# Patient Record
Sex: Female | Born: 1950 | Race: White | Hispanic: No | Marital: Married | Smoking: Never smoker
Health system: Southern US, Community
[De-identification: ages and names within clinical notes are randomized; demographics above are authoritative.]

## PROBLEM LIST (undated history)

## (undated) DIAGNOSIS — E079 Disorder of thyroid, unspecified: Secondary | ICD-10-CM

## (undated) DIAGNOSIS — K219 Gastro-esophageal reflux disease without esophagitis: Secondary | ICD-10-CM

## (undated) DIAGNOSIS — R61 Generalized hyperhidrosis: Secondary | ICD-10-CM

## (undated) DIAGNOSIS — J45909 Unspecified asthma, uncomplicated: Secondary | ICD-10-CM

## (undated) DIAGNOSIS — L0390 Cellulitis, unspecified: Secondary | ICD-10-CM

## (undated) DIAGNOSIS — E785 Hyperlipidemia, unspecified: Secondary | ICD-10-CM

## (undated) DIAGNOSIS — Z952 Presence of prosthetic heart valve: Secondary | ICD-10-CM

## (undated) DIAGNOSIS — N390 Urinary tract infection, site not specified: Secondary | ICD-10-CM

## (undated) DIAGNOSIS — Z860100 Personal history of colon polyps, unspecified: Secondary | ICD-10-CM

## (undated) DIAGNOSIS — R011 Cardiac murmur, unspecified: Secondary | ICD-10-CM

## (undated) DIAGNOSIS — F329 Major depressive disorder, single episode, unspecified: Secondary | ICD-10-CM

## (undated) DIAGNOSIS — N12 Tubulo-interstitial nephritis, not specified as acute or chronic: Secondary | ICD-10-CM

## (undated) DIAGNOSIS — I519 Heart disease, unspecified: Secondary | ICD-10-CM

## (undated) DIAGNOSIS — B019 Varicella without complication: Secondary | ICD-10-CM

## (undated) DIAGNOSIS — B9562 Methicillin resistant Staphylococcus aureus infection as the cause of diseases classified elsewhere: Secondary | ICD-10-CM

## (undated) DIAGNOSIS — F32A Depression, unspecified: Secondary | ICD-10-CM

## (undated) HISTORY — DX: Urinary tract infection, site not specified: N39.0

## (undated) HISTORY — DX: Methicillin resistant Staphylococcus aureus infection as the cause of diseases classified elsewhere: B95.62

## (undated) HISTORY — DX: Hyperlipidemia, unspecified: E78.5

## (undated) HISTORY — PX: CARDIAC SURGERY: SHX584

## (undated) HISTORY — DX: Presence of prosthetic heart valve: Z95.2

## (undated) HISTORY — DX: Depression, unspecified: F32.A

## (undated) HISTORY — DX: Generalized hyperhidrosis: R61

## (undated) HISTORY — PX: VALVE REPLACEMENT: SUR13

## (undated) HISTORY — DX: Personal history of colon polyps, unspecified: Z86.0100

## (undated) HISTORY — DX: Disorder of thyroid, unspecified: E07.9

## (undated) HISTORY — DX: Unspecified asthma, uncomplicated: J45.909

## (undated) HISTORY — DX: Major depressive disorder, single episode, unspecified: F32.9

## (undated) HISTORY — DX: Gastro-esophageal reflux disease without esophagitis: K21.9

## (undated) HISTORY — DX: Personal history of colonic polyps: Z86.010

## (undated) HISTORY — DX: Cellulitis, unspecified: L03.90

## (undated) HISTORY — DX: Cardiac murmur, unspecified: R01.1

## (undated) HISTORY — DX: Tubulo-interstitial nephritis, not specified as acute or chronic: N12

## (undated) HISTORY — DX: Heart disease, unspecified: I51.9

## (undated) HISTORY — DX: Varicella without complication: B01.9

## (undated) HISTORY — PX: PULMONARY VEIN STENOSIS REPAIR: SHX2276

---

## 1968-07-11 HISTORY — PX: ABDOMINAL HYSTERECTOMY: SHX81

## 1968-07-11 HISTORY — PX: PARTIAL HYSTERECTOMY: SHX80

## 2004-05-04 ENCOUNTER — Emergency Department: Payer: Self-pay | Admitting: Emergency Medicine

## 2005-06-15 ENCOUNTER — Ambulatory Visit: Payer: Self-pay | Admitting: Internal Medicine

## 2007-09-27 ENCOUNTER — Ambulatory Visit: Payer: Self-pay | Admitting: Internal Medicine

## 2007-10-15 ENCOUNTER — Ambulatory Visit: Payer: Self-pay | Admitting: Gastroenterology

## 2008-03-29 LAB — HM MAMMOGRAPHY: HM Mammogram: NORMAL

## 2008-08-13 ENCOUNTER — Ambulatory Visit: Payer: Self-pay | Admitting: Family Medicine

## 2008-08-13 DIAGNOSIS — K219 Gastro-esophageal reflux disease without esophagitis: Secondary | ICD-10-CM | POA: Insufficient documentation

## 2008-08-13 DIAGNOSIS — Z860100 Personal history of colon polyps, unspecified: Secondary | ICD-10-CM | POA: Insufficient documentation

## 2008-08-13 DIAGNOSIS — F339 Major depressive disorder, recurrent, unspecified: Secondary | ICD-10-CM | POA: Insufficient documentation

## 2008-08-13 DIAGNOSIS — M8580 Other specified disorders of bone density and structure, unspecified site: Secondary | ICD-10-CM | POA: Insufficient documentation

## 2008-08-13 DIAGNOSIS — R5383 Other fatigue: Secondary | ICD-10-CM

## 2008-08-13 DIAGNOSIS — R32 Unspecified urinary incontinence: Secondary | ICD-10-CM

## 2008-08-13 DIAGNOSIS — R5381 Other malaise: Secondary | ICD-10-CM | POA: Insufficient documentation

## 2008-08-14 LAB — CONVERTED CEMR LAB
ALT: 26 units/L (ref 0–35)
AST: 30 units/L (ref 0–37)
Alkaline Phosphatase: 68 units/L (ref 39–117)
Basophils Absolute: 0.1 10*3/uL (ref 0.0–0.1)
Basophils Relative: 0.9 % (ref 0.0–3.0)
CO2: 29 meq/L (ref 19–32)
Calcium: 9.7 mg/dL (ref 8.4–10.5)
Creatinine, Ser: 0.7 mg/dL (ref 0.4–1.2)
Glucose, Bld: 123 mg/dL — ABNORMAL HIGH (ref 70–99)
HDL: 42.5 mg/dL (ref >39.0)
Hemoglobin: 15.9 g/dL — ABNORMAL HIGH (ref 12.0–15.0)
Lymphocytes Relative: 24 % (ref 12.0–46.0)
MCHC: 34.9 g/dL (ref 30.0–36.0)
Monocytes Relative: 7.8 % (ref 3.0–12.0)
Neutrophils Relative %: 62.4 % (ref 43.0–77.0)
Phosphorus: 3.4 mg/dL (ref 2.3–4.6)
RBC: 5.14 M/uL — ABNORMAL HIGH (ref 3.87–5.11)
Total Bilirubin: 0.7 mg/dL (ref 0.3–1.2)
Total CHOL/HDL Ratio: 5.7
VLDL: 21 mg/dL (ref 0–40)

## 2008-08-19 LAB — CONVERTED CEMR LAB: Vit D, 1,25-Dihydroxy: 13 — ABNORMAL LOW (ref 30–89)

## 2008-08-26 ENCOUNTER — Encounter: Payer: Self-pay | Admitting: Family Medicine

## 2008-08-30 ENCOUNTER — Encounter: Payer: Self-pay | Admitting: Family Medicine

## 2008-09-15 ENCOUNTER — Ambulatory Visit: Payer: Self-pay | Admitting: Family Medicine

## 2008-09-15 DIAGNOSIS — E559 Vitamin D deficiency, unspecified: Secondary | ICD-10-CM | POA: Insufficient documentation

## 2008-09-15 DIAGNOSIS — E785 Hyperlipidemia, unspecified: Secondary | ICD-10-CM

## 2008-09-22 ENCOUNTER — Telehealth: Payer: Self-pay | Admitting: Family Medicine

## 2008-09-24 ENCOUNTER — Ambulatory Visit: Payer: Self-pay | Admitting: Family Medicine

## 2008-09-24 ENCOUNTER — Encounter: Payer: Self-pay | Admitting: Family Medicine

## 2008-10-14 ENCOUNTER — Ambulatory Visit: Payer: Self-pay | Admitting: Family Medicine

## 2008-10-27 ENCOUNTER — Ambulatory Visit: Payer: Self-pay | Admitting: Family Medicine

## 2008-11-07 ENCOUNTER — Encounter: Payer: Self-pay | Admitting: Family Medicine

## 2008-12-03 ENCOUNTER — Ambulatory Visit: Payer: Self-pay | Admitting: Family Medicine

## 2008-12-15 ENCOUNTER — Ambulatory Visit: Payer: Self-pay | Admitting: Family Medicine

## 2008-12-15 DIAGNOSIS — R61 Generalized hyperhidrosis: Secondary | ICD-10-CM

## 2008-12-17 LAB — CONVERTED CEMR LAB: Vit D, 25-Hydroxy: 33 ng/mL (ref 30–89)

## 2009-05-05 ENCOUNTER — Telehealth: Payer: Self-pay | Admitting: Family Medicine

## 2009-05-05 LAB — CONVERTED CEMR LAB
Cholesterol: 184 mg/dL (ref 0–200)
HDL: 45.4 mg/dL (ref >39.00)
Triglycerides: 141 mg/dL (ref 0.0–149.0)
VLDL: 28.2 mg/dL (ref 0.0–40.0)

## 2009-06-12 ENCOUNTER — Ambulatory Visit: Payer: Self-pay | Admitting: Family Medicine

## 2009-09-01 ENCOUNTER — Encounter: Payer: Self-pay | Admitting: Family Medicine

## 2009-11-05 ENCOUNTER — Encounter: Payer: Self-pay | Admitting: Family Medicine

## 2009-11-10 ENCOUNTER — Ambulatory Visit: Payer: Self-pay | Admitting: Internal Medicine

## 2009-12-08 ENCOUNTER — Ambulatory Visit: Payer: Self-pay | Admitting: Family Medicine

## 2009-12-08 DIAGNOSIS — E079 Disorder of thyroid, unspecified: Secondary | ICD-10-CM | POA: Insufficient documentation

## 2009-12-08 DIAGNOSIS — R1011 Right upper quadrant pain: Secondary | ICD-10-CM

## 2009-12-09 ENCOUNTER — Encounter (INDEPENDENT_AMBULATORY_CARE_PROVIDER_SITE_OTHER): Payer: Self-pay | Admitting: *Deleted

## 2009-12-09 LAB — CONVERTED CEMR LAB: Vit D, 25-Hydroxy: 29 ng/mL — ABNORMAL LOW (ref 30–89)

## 2009-12-10 ENCOUNTER — Encounter: Payer: Self-pay | Admitting: Family Medicine

## 2009-12-10 LAB — CONVERTED CEMR LAB
AST: 28 units/L (ref 0–37)
Alkaline Phosphatase: 75 units/L (ref 39–117)
Bilirubin, Direct: 0.1 mg/dL (ref 0.0–0.3)
Hgb A1c MFr Bld: 7 % — ABNORMAL HIGH (ref 4.6–6.5)
TSH: 7.76 microintl units/mL — ABNORMAL HIGH (ref 0.35–5.50)
Total Bilirubin: 0.4 mg/dL (ref 0.3–1.2)

## 2010-02-03 ENCOUNTER — Ambulatory Visit: Payer: Self-pay | Admitting: Family Medicine

## 2010-02-03 DIAGNOSIS — N390 Urinary tract infection, site not specified: Secondary | ICD-10-CM | POA: Insufficient documentation

## 2010-02-03 LAB — CONVERTED CEMR LAB
Bilirubin Urine: NEGATIVE
Glucose, Urine, Semiquant: NEGATIVE
Ketones, urine, test strip: NEGATIVE
Nitrite: NEGATIVE
RBC / HPF: 0
Specific Gravity, Urine: 1.02
Urine crystals, microscopic: 0 /hpf

## 2010-02-05 ENCOUNTER — Encounter: Payer: Self-pay | Admitting: Family Medicine

## 2010-02-05 ENCOUNTER — Ambulatory Visit: Payer: Self-pay | Admitting: Family Medicine

## 2010-03-05 ENCOUNTER — Ambulatory Visit: Payer: Self-pay | Admitting: Family Medicine

## 2010-03-06 LAB — CONVERTED CEMR LAB
Free T4: 0.75 ng/dL (ref 0.60–1.60)
TSH: 6.95 microintl units/mL — ABNORMAL HIGH (ref 0.35–5.50)

## 2010-03-11 ENCOUNTER — Encounter: Payer: Self-pay | Admitting: Family Medicine

## 2010-04-20 ENCOUNTER — Encounter (INDEPENDENT_AMBULATORY_CARE_PROVIDER_SITE_OTHER): Payer: Self-pay | Admitting: *Deleted

## 2010-07-07 ENCOUNTER — Ambulatory Visit
Admission: RE | Admit: 2010-07-07 | Discharge: 2010-07-07 | Payer: Self-pay | Source: Home / Self Care | Attending: Family Medicine | Admitting: Family Medicine

## 2010-08-12 NOTE — Assessment & Plan Note (Signed)
Summary: FOLLOW UP LABS PER DR Shelonda Saxe/[place]   Vital Signs:  Patient profile:   60 year old female Height:      60 inches Weight:      173.25 pounds BMI:     33.96 Temp:     98 degrees F oral Pulse rate:   80 / minute Pulse rhythm:   regular BP sitting:   116 / 78  (left arm) Cuff size:   regular  Vitals Entered By: Lewanda Rife LPN (Dec 08, 2009 8:36 AM) CC: follow-up visit after labs   History of Present Illness: here for f/u of DM and chol and recent labs from work   is feeling fair - but really tired  could sleep 24 hours a day  does not sleep well in general  sweats a lot   no sex drive   not doing well with healthy diet and exercise  has done the DM classes  plans to eat more veg this summer   eats too much corn   does not exercise - cannot find time   gluc fasting was 139-- needs AIC  opthy - none within past year  vit D due for check   LDL 125 (up from 110 last time   wt is up 2 lb  bp great at 116/78  tsh high at over 8  lots of hypothyroidism in family  had a goiter in past and it went away -- ? if hyperthyroid in the past     her mother's health is getting worse and worse -- with memory too  2 kids - caring for them - in group homes   Allergies: 1)  ! 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  Retired sister is pt here- Therapist, occupational cares for aging mother with alz cares for 2 disabled children  Never Smoked Alcohol use-occ wine  Drug use-no G1P1 adopted son - Asberger's syndrome  daughter with Down's syndrome --- both in group homes  cares for daughter with Down's Syndrome  cares for her 70 year old mother   Risk Factors: Smoking Status: never (08/13/2008)  Past Medical History: heart valve/ pulmonary- replacement  Depression GERD Urinary incontinence Colonic polyps, hx of atypical pap in past  osteoporosis  DM 2  lipoma 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)  cardiology- Dr Earma Reading [facility]) [phone] [facility]  Social History: 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 Eyes:  Denies blurring, eye irritation, and itching. ENT:  Denies sore throat. CV:  Denies chest pain or discomfort, palpitations, shortness of breath with exertion, and swelling of feet. Resp:  Denies cough and wheezing. GI:  Complains of abdominal pain; denies change in bowel habits and indigestion. GU:  Denies discharge, dysuria, and urinary frequency. MS:  L side of neck is always tight and sore -- MRA was nl . Derm:  Denies itching, lesion(s), poor wound healing, and rash. Neuro:  Denies numbness, tingling, and weakness. Psych:  Denies anxiety and  depression. Endo:  Denies cold intolerance, excessive thirst, excessive urination, and heat intolerance. Heme:  Denies abnormal bruising and bleeding.  Physical Exam  General:  overweight but generally well appearing  Head:  normocephalic, atraumatic, and no abnormalities observed.   Eyes:  vision grossly intact, pupils equal, pupils round, and pupils reactive to light.  no conjunctival pallor, injection or icterus  Mouth:  pharynx pink and moist.   Neck:  supple with full rom and no masses or thyromegally, no JVD or carotid bruit  Chest Wall:  No deformities, masses, or tenderness noted. Lungs:  Normal respiratory effort, chest expands symmetrically. Lungs are clear to auscultation, no crackles or wheezes. Heart:  heart M is stable  Abdomen:  mild RUQ tenderness without murphy sign no rebound or gaurding  soft, normal bowel sounds, no distention, no hepatomegaly, and no splenomegaly.   Msk:  No deformity or scoliosis noted of thoracic or lumbar spine.  no acute joint changes  Pulses:  R and L carotid,radial,femoral,dorsalis pedis and posterior tibial pulses are full and equal bilaterally Extremities:  No clubbing, cyanosis, edema, or deformity noted with normal full range of motion of all joints.   Neurologic:  sensation intact to light touch, gait normal, and DTRs symmetrical and normal.  no tremor  Skin:  Intact without suspicious lesions or rashes Cervical Nodes:  No lymphadenopathy noted Inguinal Nodes:  No significant adenopathy Psych:  nl affect - but seems fatigued    Impression & Recommendations:  Problem # 1:  THYROID STIMULATING HORMONE, ABNORMAL (ICD-246.9) Assessment New hypothyroid likely causing fatigue check profile  hx of past goiter- no longer there  then adv  f/u 2-4 wk Orders: Venipuncture (47829) TLB-Hepatic/Liver Function Pnl (80076-HEPATIC) TLB-TSH (Thyroid Stimulating Hormone) (84443-TSH) TLB-T3 Uptake (84479-T3UP) TLB-T4 (Thyrox), Free  [phone]) T-Vitamin D (25-Hydroxy) (78469-62952) TLB-A1C / Hgb A1C (Glycohemoglobin) (83036-A1C) Specimen Handling (84132) Prescription Created Electronically [phone])  Problem # 2:  UNSPECIFIED VITAMIN D DEFICIENCY (ICD-268.9) Assessment: Unchanged check level - with OP and fatigue  Orders: Venipuncture ([address]) TLB-Hepatic/Liver Function Pnl (80076-HEPATIC) TLB-TSH (Thyroid Stimulating Hormone) (84443-TSH) TLB-T3 Uptake (84479-T3UP) TLB-T4 (Thyrox), Free [phone]) T-Vitamin D (25-Hydroxy) (25956-38756) TLB-A1C / Hgb A1C (Glycohemoglobin) (83036-A1C) Specimen Handling ([address]329) Prescription Created Electronically [phone])  Problem # 3:  DIABETES-TYPE 2 (ICD-250.00) Assessment: Unchanged  disc with pt  may not be well controlled disc healthy diet (low simple sugar/ choose complex carbs/ low sat fat) diet and exercise in detail  get eye exam - adv  AIC today  disc at f/u Her updated medication list for this problem includes:    Aspirin 81 Mg Tabs (Aspirin) .Marland Kitchen... Take 1 tablet by mouth once a day  Orders: Venipuncture ([address]606) TLB-Hepatic/Liver Function Pnl (80076-HEPATIC) TLB-TSH (Thyroid  Stimulating Hormone) (84443-TSH) TLB-T3 Uptake (84479-T3UP) TLB-T4 (Thyrox), Free [phone]) T-Vitamin D (25-Hydroxy) [phone]) TLB-A1C / Hgb A1C (Glycohemoglobin) [phone]) Prescription Created Electronically [phone])  Labs Reviewed: Creat: 0.7 (08/13/2008)    Reviewed HgBA1c results: 6.6 (12/03/2008)  6.7 (08/13/2008)  Problem # 4:  HYPERLIPIDEMIA (ICD-272.4) Assessment: Deteriorated  rev labs and LDL is too high on statin disc low sat fat diet may need to inc dose if not imp Her updated medication list for this problem includes:    Zocor 20 Mg Tabs (Simvastatin) .Marland Kitchen... 1 by mouth once daily in evening for cholesterol  Labs Reviewed: SGOT: 26 (10/27/2008)   SGPT: 23 (10/27/2008)   HDL:45.40 (10/27/2008), 42.5 (08/13/2008)  LDL:110 (10/27/2008), DEL (08/13/2008)   Chol:184 (10/27/2008), 244 (08/13/2008)  Trig:141.0 (10/27/2008), 104 (08/13/2008)  Orders: Prescription Created Electronically [phone])  Problem # 5:  DEPRESSION (ICD-311) Assessment: Unchanged  refl med - overall stable  disc ongoing stress- caring for her mother  Her updated medication list for this problem includes:    Prozac 40 Mg Caps (Fluoxetine hcl) .Marland Kitchen... 1 by mouth once daily  Orders: Prescription Created Electronically [phone])  Problem # 6:  RUQ PAIN (ICD-789.01) Assessment: New  ongoing for while - worse at night  had appy years ago  ? adhesions vs poss gallstones will rev lab and disc further at f/u - consider [place]  Orders: Prescription Created Electronically [phone])  Complete Medication List: 1)  Prozac 40 Mg Caps (Fluoxetine hcl) .Marland Kitchen.. 1 by mouth once daily 2)  Zocor 20 Mg Tabs (Simvastatin) .Marland Kitchen.. 1 by mouth once daily in evening for cholesterol 3)  Aspirin 81 Mg Tabs (Aspirin) .... Take 1 tablet by mouth once a day 4)  Cvs Vitamin D 2000 Unit Tabs (Cholecalciferol) .... Take 1 tablet by mouth once a day  Patient Instructions: 1)  don't forget to get your annual diabetic eye exam  2)  exericse is improtant and may really help you deal with stress as well  3)  labs today for AIC and vit D and blood count and thyroid profile  4)  follow up in about 2-4 weeks to review labs and also to discuss your abdominal pain in more detail  5)  please work on low sugar and low fat diet  Prescriptions: ZOCOR 20 MG TABS (SIMVASTATIN) 1 by mouth once daily in evening for cholesterol  #30 x 11   Entered and Authorized by:   Judith Part MD   Signed by:   Judith Part MD on 12/08/2009   Method used:   Electronically to        [id number]       [address]       [place], [place]  [address]       Ph: [phone]       Fax: [phone]   RxID:   [id number] PROZAC 40 MG CAPS (FLUOXETINE HCL) 1 by mouth once daily  #30 x 11   Entered and  Authorized by:   Judith Part MD   Signed by:   Judith Part MD on 12/08/2009   Method used:   Electronically to        [id number]       [address]       [place], [place]  [address]       Ph: [phone]       Fax: [phone]   RxID:   [id number]   Current Allergies (reviewed today): ! *  OXYCONTIN

## 2010-08-12 NOTE — Consult Note (Signed)
Summary: [facility]  [facility]   Imported By: Lanelle Bal 04/14/2010 10:34:57  _____________________________________________________________________  External Attachment:    Type:   Image     Comment:   External Document

## 2010-08-12 NOTE — Letter (Signed)
Summary: [place] No Show Letter  [place] at [facility]  [address] [place], [place] [address]   Phone: [phone]  Fax: [phone]    04/20/2010 MRN: [id number]  [facility] [address] [place], [place]  [address]   Dear Ms. [place],   Our records indicate that you missed your scheduled appointment with ___Lab __________________ on _10.11.11___________.  Please contact this office to reschedule your appointment as soon as possible.  It is important that you keep your scheduled appointments with your physician, so we can provide you the best care possible.  Please be advised that there may be a charge for "no show" appointments.    Sincerely,   [place] at [facility]

## 2010-08-12 NOTE — Assessment & Plan Note (Signed)
Summary: sinus infection/alc   Vital Signs:  Patient profile:   60 year old female Weight:      171 pounds BMI:     33.52 Temp:     97.9 degrees F oral Pulse rate:   68 / minute Pulse rhythm:   regular Resp:     14 per minute BP sitting:   110 / 70  (left arm) Cuff size:   regular  Vitals Entered By: Mervin Hack CMA Duncan Dull) (Nov 10, 2009 11:03 AM) CC: sinus infection   History of Present Illness: Having sinus infection Has lots of yellow nasal discharge COugh--with some yellow stuff also--thinks it is PND  ear pain but no sig headache sore throat dizzy when bending over  No fever Has felt cold then warm but no shakes NO SOB  tried mucinex DM---slight help  started 4days ago sinus infections in past  Allergies: 1)  ! * Oxycontin  Past History:  Past medical, surgical, family and social histories (including risk factors) reviewed for relevance to current acute and chronic problems.  Past Medical History: Reviewed history from 12/15/2008 and no changes required. 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 R axilla- [place] (per pt ) 2D echo 2/10 mild AR, trivial MR, mod PR, trivial Tr- nl LV syst fxn  Family History: Reviewed history from 08/13/2008 and no changes required. father - alcohol/ abusive and died of cirrhosis mother with DM , copd , obese, depression  sister with DM daughter with Down's Syndrome  daughter with lupus and ulcerative colitis and RA  brother  with Down's Syndrome sister with coarctation of aorta- she died 8 years ago    Family History Breast cancer  Family History of Arthritis Family History of Heart Disease  Social History: Reviewed history from 08/13/2008 and no changes required. 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  NAD Head:  no frontal or maxillary tenderness Ears:  R ear normal and L ear normal.   Nose:  moderate inflammation--right worse than left Mouth:  no erythema and no exudates.   Neck:  supple, no masses, and no cervical lymphadenopathy.   Lungs:  normal respiratory effort, no intercostal retractions, no accessory muscle use, and normal breath sounds.  COarse dry cough   Impression & Recommendations:  Problem # 1:  SINUSITIS - ACUTE-NOS (ICD-461.9) Assessment New not clear if viral or bacterial but she feels she is worsening discussed supportive care rx for amoxil  Complete Medication List: 1)  Prozac 40 Mg Caps (Fluoxetine hcl) .Marland Kitchen.. 1 by mouth once daily 2)  Zocor 20 Mg Tabs (Simvastatin) .Marland Kitchen.. 1 by mouth once daily in evening  for cholesterol 3)  Aspirin 81 Mg Tabs (Aspirin) .... Take 1 tablet by mouth once a day 4)  Cvs Vitamin D 2000 Unit Tabs (Cholecalciferol) .... Take 1 tablet by mouth once a day 5)  Amoxicillin 500 Mg Tabs (Amoxicillin) .... 2 tabs by mouth two times a day for sinus infection  Patient Instructions: 1)  Please schedule a follow-up appointment as needed .  Prescriptions: AMOXICILLIN 500 MG TABS (AMOXICILLIN) 2 tabs by mouth two times a day for sinus infection  #40 x 0   Entered and Authorized by:    Cindee Salt MD   Signed by:   Cindee Salt MD on 11/10/2009   Method used:   Electronically to        [id number]       [address]       [place], [place]  [address]       Ph: [phone]       Fax: [phone]   RxID:   [id number]   Current Allergies (reviewed today): ! * OXYCONTIN

## 2010-08-12 NOTE — Miscellaneous (Signed)
Summary: Synthroid med list update  Clinical Lists Changes  Medications: Added new medication of SYNTHROID 50 MCG TABS (LEVOTHYROXINE SODIUM) Take 1 tablet by mouth once a day     Current Allergies: ! * OXYCONTIN

## 2010-08-12 NOTE — Assessment & Plan Note (Signed)
Summary: RIGHT SIDE PAIN/CLE   Vital Signs:  Patient profile:   60 year old female Height:      60 inches Weight:      175 pounds BMI:     34.30 Temp:     98.8 degrees F oral Pulse rate:   80 / minute Pulse rhythm:   regular BP sitting:   106 / 72  (left arm) Cuff size:   regular  Vitals Entered By: Lewanda Rife LPN (February 03, 2010 12:48 PM) CC: Dull pain in rt side on and off worse when lays down   History of Present Illness: here for R side pain that is dull and intermittent - worse when lying down is not all the time  feels bloated to in abdomen in general  stools are rather loose - for the past month   eating makes no difference   no change with bend or twist or turn   no rash   worse to lie on her L side   recent labs ok    always has some lower back pain in the middle   last colonoscopy jan of 09 -- and due in 3 years - will be [place] of 2012  is worried about her polyps  no more appendix does have a gallbladder     wt is up 2 lb  bp 106/72  did have to start thyroid suppl  urine - does burn at times to urinate  no frequency or odor or blood     Allergies: 1)  ! 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, occupational cares for aging mother with alz cares for 2 disabled children  Never Smoked Alcohol use-occ wine  Drug use-no G1P1 adopted son - Asberger's syndrome  daughter with Down's syndrome --- both in group homes  cares for daughter with Down's Syndrome  cares for her 70 year old mother   Risk Factors: Smoking Status: never (08/13/2008)  Past Medical History: heart valve/ pulmonary- replacement  Depression GERD Urinary incontinence Colonic polyps, hx of atypical pap in past  osteoporosis  DM 2  lipoma 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 Eyes:  Denies blurring and eye irritation. CV:  Denies chest pain or discomfort, palpitations, shortness of breath with exertion, and swelling of feet. Resp:  Denies cough, shortness of breath, and wheezing. GI:  Complains of abdominal pain, change in bowel habits, diarrhea, and gas; denies bloody stools, indigestion, loss of  appetite, nausea, and vomiting. MS:  Complains of mid back pain. Derm:  Denies itching, lesion(s), poor wound healing, and rash. Neuro:  Denies numbness, tingling, and weakness. Psych:  mood is ok . Endo:  Denies excessive thirst and excessive urination. Heme:  Denies abnormal bruising and bleeding.  Physical Exam  General:  overweight but generally well appearing  Head:  normocephalic, atraumatic, and no abnormalities observed.   Eyes:  vision grossly intact, pupils equal, pupils round, and pupils reactive to light.  no conjunctival pallor, injection or icterus  Mouth:  pharynx pink and moist.   Neck:  supple with full rom and no masses or thyromegally, no JVD or carotid bruit  Lungs:  Normal respiratory effort,  chest expands symmetrically. Lungs are clear to auscultation, no crackles or wheezes. Heart:  heart M is stable  Abdomen:  mild RUQ tenderness without murphy sign no rebound or gaurding  soft, normal bowel sounds, no distention, no hepatomegaly, and no splenomegaly.   some R lateral rib tenderness  Msk:  mild R sided cva tenderness today no spinal tenderness  Extremities:  No clubbing, cyanosis, edema, or deformity noted with normal full range of motion of all joints.   Neurologic:  sensation intact to light touch, gait normal, and DTRs symmetrical and normal.  no tremor  Skin:  Intact without suspicious lesions or rashes Cervical Nodes:  No lymphadenopathy noted Inguinal Nodes:  No significant adenopathy Psych:  normal affect, talkative and pleasant    Impression & Recommendations:  Problem # 1:  THYROID STIMULATING HORMONE, ABNORMAL (ICD-246.9) Assessment Unchanged enc pt to be better about dosing and re check thyroid panel on low dose supplementation in 1 mo  no clinical changes   Problem # 2:  RUQ PAIN (ICD-789.01) non specific with some tenderness differential is wide incl gallstones/ kidney pathology/ adhesions/ radiculopathy will check abd [place]  tx poss uti with flank pain and obt cx then update old labs reviewed  Orders: Radiology Referral (Radiology)  Complete Medication List: 1)  Prozac 40 Mg Caps (Fluoxetine hcl) .Marland Kitchen.. 1 by mouth once daily 2)  Zocor 20 Mg Tabs (Simvastatin) .Marland Kitchen.. 1 by mouth once daily in evening for cholesterol 3)  Aspirin 81 Mg Tabs (Aspirin) .... Take 1 tablet by mouth once a day 4)  Cvs Vitamin D 2000 Unit Tabs (Cholecalciferol) .... Take 2  tablets by mouth once a day 5)  Synthroid 50 Mcg Tabs (Levothyroxine sodium) .... Take 1 tablet by mouth once a day 6)  Cipro 500 Mg Tabs (Ciprofloxacin hcl) .Marland Kitchen.. 1 by mouth two times a day for 7 days  Other Orders: T-Culture, Urine ([id number]) UA Dipstick W/ Micro (manual) ([address])  Patient  Instructions: 1)  we will refer you for abdominal ultrasound at check out  2)  schedule labs for tsh/ free T4 in 1 mo 244.9-- keep taking your thyroid med  3)  take the cipro as directed - and drink lots of water 4)  will make further plan when ultrasound and urine culture return  Prescriptions: CIPRO 500 MG TABS (CIPROFLOXACIN HCL) 1 by mouth two times a day for 7 days  #14 x 0   Entered and Authorized by:   Judith Part MD   Signed by:   Judith Part MD on 02/03/2010   Method used:   [facility] to        [id number]       [address]  [place], [place]  [address]       Ph: [phone]       Fax: [phone]   RxID:   [id number]   Current Allergies (reviewed today): ! * OXYCONTIN  Laboratory Results   Urine Tests  Date/Time Received: February 03, 2010 12:49 PM  Date/Time Reported: February 03, 2010 12:49 PM   Routine Urinalysis   Color: yellow Appearance: Hazy Glucose: negative   (Normal Range: Negative) Bilirubin: negative   (Normal Range: Negative) Ketone: negative   (Normal Range: Negative) Spec. Gravity: 1.020   (Normal Range: 1.003-1.035) Blood: trace-lysed   (Normal Range: Negative) pH: 6.0   (Normal Range: 5.0-8.0) Protein: trace   (Normal Range: Negative) Urobilinogen: 0.2   (Normal Range: 0-1) Nitrite: negative   (Normal Range: Negative) Leukocyte Esterace: small   (Normal Range: Negative)  Urine Microscopic WBC/HPF: 3-4 RBC/HPF: 0 Bacteria/HPF: few Mucous/HPF: few Epithelial/HPF: 0-1 Crystals/HPF: 0 Casts/LPF: 0 Yeast/HPF: 0 Other: 0

## 2010-08-12 NOTE — Assessment & Plan Note (Signed)
Summary: CONGESTION/CLE   Vital Signs:  Patient profile:   60 year old female Height:      60 inches Weight:      173.25 pounds BMI:     33.96 Temp:     98.1 degrees F oral Pulse rate:   84 / minute Pulse rhythm:   regular BP sitting:   138 / 86  (left arm) Cuff size:   regular  Vitals Entered By: Delilah Shan CMA Duncan Dull) (July 07, 2010 11:57 AM) CC: Congestion, ? sinus infection.  Patient has taken some Amoxicillin 400 mg. that she had on hand.   History of Present Illness: Pt here for congestion.  She took Amox over Christmas for congestion and cough...it was left over from dental work in the past. She had no chills and did not take her temp but thinks she had a fever. She had no headache except with leaning over. She has right ear congestion and discomfort. Her nose is running with yellow to clear  sputum.  She had ST, now gone. She had minimal cough that was nonproductive. She had no N/V.  She took some Amox and sinus medication. She has been sleeping ok.  Problems Prior to Update: 1)  Uti  (ICD-599.0) 2)  Ruq Pain  (ICD-789.01) 3)  Thyroid Stimulating Hormone, Abnormal  (ICD-246.9) 4)  Hyperhidrosis  (ICD-780.8) 5)  Unspecified Vitamin D Deficiency  (ICD-268.9) 6)  Other Screening Mammogram  (ICD-V76.12) 7)  Diabetes-type 2  (ICD-250.00) 8)  Hyperlipidemia  (ICD-272.4) 9)  Pulmonary Valve Insufficiency  (ICD-424.3) 10)  Osteoporosis  (ICD-733.00) 11)  Fatigue  (ICD-780.79) 12)  Family History Breast Cancer 1st Degree Relative <50  (ICD-V16.3) 13)  Family History of Asthma  (ICD-V17.5) 14)  Family History Diabetes 1st Degree Relative  (ICD-V18.0) 15)  Family History of Alcoholism/addiction  (ICD-V61.41) 16)  Colonic Polyps, Hx of  (ICD-V12.72) 17)  Urinary Incontinence  (ICD-788.30) 18)  Gerd  (ICD-530.81) 19)  Depression  (ICD-311)  Medications Prior to Update: 1)  Prozac 40 Mg Caps (Fluoxetine Hcl) .Marland Kitchen.. 1 By Mouth Once Daily 2)  Zocor 20 Mg Tabs  (Simvastatin) .Marland Kitchen.. 1 By Mouth Once Daily in Evening For Cholesterol 3)  Aspirin 81 Mg  Tabs (Aspirin) .... Take 1 Tablet By Mouth Once A Day 4)  [facility] Vitamin D 2000 Unit Tabs (Cholecalciferol) .... Take 2  Tablets By Mouth Once A Day 5)  Synthroid 75 Mcg Tabs (Levothyroxine Sodium) .Marland Kitchen.. 1 By Mouth Once Daily 6)  Cipro 500 Mg Tabs (Ciprofloxacin Hcl) .Marland Kitchen.. 1 By Mouth Two Times A Day For 7 Days  Allergies: 1)  ! * Oxycontin  Physical Exam  General:  Mildly overweight but generally well appearing  Head:  Normocephalic, atraumatic, and no abnormalities observed.  Sinuses NT. Eyes:  Conjunctiva clear bilaterally.  Ears:  External ear exam shows no significant lesions or deformities.  Otoscopic examination reveals clear canals, tympanic membranes are intact bilaterally without bulging, retraction, inflammation or discharge. Hearing is grossly normal bilaterally. Nose:  Minimal congestion and nasal swelling with scant clear mucous. Mouth:  Oral mucosa and oropharynx without lesions or exudates.  Teeth in good repair. Neck:  supple with full rom and no masses or thyromegally, no JVD or carotid bruit  Lungs:  Normal respiratory effort, chest expands symmetrically. Lungs are clear to auscultation, no crackles or wheezes. Heart:  heart M is stable    Impression & Recommendations:  Problem # 1:  URI (ICD-465.9) Assessment New See instructions. Her updated medication  list for this problem includes:    Aspirin 81 Mg Tabs (Aspirin) .Marland Kitchen... Take 1 tablet by mouth once a day  Instructed on symptomatic treatment. Call if symptoms persist or worsen.   Complete Medication List: 1)  Prozac 40 Mg Caps (Fluoxetine hcl) .Marland Kitchen.. 1 by mouth once daily 2)  Zocor 20 Mg Tabs (Simvastatin) .Marland Kitchen.. 1 by mouth once daily in evening for cholesterol 3)  Aspirin 81 Mg Tabs (Aspirin) .... Take 1 tablet by mouth once a day 4)  [facility] Vitamin D 2000 Unit Tabs (Cholecalciferol) .... Take 2  tablets by mouth once a day 5)   Synthroid 75 Mcg Tabs (Levothyroxine sodium) .Marland Kitchen.. 1 by mouth once daily 6)  Multivitamins Tabs (Multiple vitamin) .... Take 1 tablet by mouth once a day 7)  Zithromax Z-pak 250 Mg Tabs (Azithromycin) .... As dir  Patient Instructions: 1)  Take Guaifenesin by [facility] or [facility] and getting MUCOUS RELIEF EXPECTORANT (400mg ), take 11/2 tabs by mouth AM and NOON. 2)  Drink lots of fluids anytime taking Guaifenesin.  3)  Take Tyl ES 2 tabs by mouth three times a day regularly for 4-5 days. 4)  Use cough drop as described.  5)  By Sat if no impr, start Zithromax. Throw away if not used this week. Prescriptions: ZITHROMAX Z-PAK 250 MG TABS (AZITHROMYCIN) as dir  #1 pak x 0   Entered and Authorized by:   Shaune Leeks MD   Signed by:   Shaune Leeks MD on 07/07/2010   Method used:   Print then Give to Patient   RxID:   [id number]    Orders Added: 1)  Est. Patient Level III [14782]    Current Allergies (reviewed today): ! * OXYCONTIN

## 2010-08-12 NOTE — Letter (Signed)
Summary: [facility]  [facility]   Imported By: Lanelle Bal 09/08/2009 08:25:52  _____________________________________________________________________  External Attachment:    Type:   Image     Comment:   External Document

## 2010-08-12 NOTE — Miscellaneous (Signed)
  Clinical Lists Changes  Medications: Changed medication from [facility] VITAMIN D 2000 UNIT TABS (CHOLECALCIFEROL) Take 1 tablet by mouth once a day to [facility] VITAMIN D 2000 UNIT TABS (CHOLECALCIFEROL) Take 2  tablets by mouth once a day

## 2010-08-13 ENCOUNTER — Emergency Department: Payer: Self-pay | Admitting: Emergency Medicine

## 2010-08-31 ENCOUNTER — Encounter: Payer: Self-pay | Admitting: Family Medicine

## 2010-09-21 NOTE — Consult Note (Signed)
Summary: [facility]  [facility]   Imported By: Maryln Gottron 09/16/2010 15:23:17  _____________________________________________________________________  External Attachment:    Type:   Image     Comment:   External Document

## 2010-12-16 ENCOUNTER — Encounter: Payer: Self-pay | Admitting: Family Medicine

## 2010-12-17 ENCOUNTER — Ambulatory Visit (INDEPENDENT_AMBULATORY_CARE_PROVIDER_SITE_OTHER): Payer: Managed Care, Other (non HMO) | Admitting: Family Medicine

## 2010-12-17 ENCOUNTER — Telehealth: Payer: Self-pay | Admitting: Family Medicine

## 2010-12-17 ENCOUNTER — Encounter: Payer: Self-pay | Admitting: Family Medicine

## 2010-12-17 DIAGNOSIS — E559 Vitamin D deficiency, unspecified: Secondary | ICD-10-CM

## 2010-12-17 DIAGNOSIS — J209 Acute bronchitis, unspecified: Secondary | ICD-10-CM

## 2010-12-17 DIAGNOSIS — M810 Age-related osteoporosis without current pathological fracture: Secondary | ICD-10-CM

## 2010-12-17 DIAGNOSIS — E079 Disorder of thyroid, unspecified: Secondary | ICD-10-CM

## 2010-12-17 DIAGNOSIS — E785 Hyperlipidemia, unspecified: Secondary | ICD-10-CM

## 2010-12-17 DIAGNOSIS — R61 Generalized hyperhidrosis: Secondary | ICD-10-CM

## 2010-12-17 DIAGNOSIS — E119 Type 2 diabetes mellitus without complications: Secondary | ICD-10-CM

## 2010-12-17 DIAGNOSIS — J019 Acute sinusitis, unspecified: Secondary | ICD-10-CM | POA: Insufficient documentation

## 2010-12-17 DIAGNOSIS — K219 Gastro-esophageal reflux disease without esophagitis: Secondary | ICD-10-CM

## 2010-12-17 NOTE — Patient Instructions (Signed)
Drink lots of fluids  I think the sinus infection is slowly improving Your bronchitis may be viral -- and it will take time to get better  Continue all your medicines  Get some rest !!! That is most important ! If you worsen-- with increased cough/fever/ facial pain or not improving in a week- please let me know

## 2010-12-17 NOTE — Assessment & Plan Note (Signed)
This is most likely viral and slowly improving Benign exam today Continue omnicef (for sinusitis) hycodan and proair  Update asap if worse or not imp in a week

## 2010-12-17 NOTE — Telephone Encounter (Signed)
This was done in error.

## 2010-12-17 NOTE — Assessment & Plan Note (Addendum)
Head pressure and nasal symptoms are imp on omnicef Rev UC notes today Started as a cold Will finish med Update if worse or not imp in 1 week

## 2010-12-17 NOTE — Progress Notes (Signed)
Subjective:    Patient ID: Haley Young, female    DOB: [birth year], 60 y.o.   MRN: [id number]  HPI Here for bronchitis symptoms  Last Saturday started with severe fatigue and runny nose and cough  Went to [employer] on Monday -- was really bad  Thought she had pneumonia  Then did cxr and dx with bronchitis and sinusitis   Given omnicef and proair and hycodan Still coughs all night   Sinus pain/ headaches are improved No fever  Coughing up yellow mucous  Is wheezing fair amt -- uses inhaler and is helping     Brother and son had pneumonia - was exp to that and was worried  Was originally here to rev her labs - forgot to bring them  Decided to be seen for illness instead   Patient Active Problem List  Diagnoses  . THYROID STIMULATING HORMONE, ABNORMAL  . DIABETES-TYPE 2  . UNSPECIFIED VITAMIN D DEFICIENCY  . HYPERLIPIDEMIA  . DEPRESSION  . PULMONARY VALVE INSUFFICIENCY  . GERD  . OSTEOPOROSIS  . FATIGUE  . HYPERHIDROSIS  . URINARY INCONTINENCE  . RUQ PAIN  . COLONIC POLYPS, HX OF  . Bronchitis, acute  . Acute sinusitis   Past Medical History  Diagnosis Date  . 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 GERD (gastroesophageal reflux disease)   . Depression   . Urinary incontinence   . Hx of colonic polyps   . Osteoporosis   . Diabetes mellitus   . Lipoma     R axilla   . Hyperhidrosis     especially with hot flashes    . Frequent UTI   . Pyelocystitis     as a child    Past Surgical History  Procedure Date  . Partial hysterectomy 1970  . Cardiac surgery 1955, 2005  . Valve replacement   . Pulmonary vein stenosis repair    History  Substance Use Topics  . Smoking status: Never Smoker   . Smokeless tobacco: Not on file  . Alcohol Use: Yes     occasional wine    Family History  Problem Relation Age of Onset  . Diabetes Mother   . COPD Mother   . Obesity Mother   . Depression Mother   . Alcohol abuse Father   . Cirrhosis  Father   . Diabetes Sister   . Down syndrome Brother   . Down syndrome Daughter   . Lupus Daughter   . Ulcerative colitis Daughter   . Coarctation of the aorta Sister    Allergies  Allergen Reactions  . Oxycodone Hcl     REACTION: hallucinations  . Oxycontin Other (See Comments)    halucinations   Current Outpatient Prescriptions on File Prior to Visit  Medication Sig Dispense Refill  . aspirin 81 MG tablet Take 81 mg by mouth daily.        Marland Kitchen FLUoxetine (PROZAC) 40 MG capsule Take 40 mg by mouth daily.        . Cholecalciferol 2000 UNITS TABS Take 2 tablets by mouth once a day       . levothyroxine (SYNTHROID) 75 MCG tablet Take 75 mcg by mouth daily.        . Multiple Vitamin (MULTIVITAMIN) tablet Take 1 tablet by mouth daily.        . simvastatin (ZOCOR) 20 MG tablet 1 by mouth once daily in evening for cholesterol  Review of Systems Review of Systems  Constitutional: Negative for fever, appetite change, fatigue and unexpected weight change.  Eyes: Negative for pain and visual disturbance.  ENT pos for runny and stuffy nose and facial pain that is improving Respiratory: pos for cough and slt wheeze/ no sob no cp  Cardiovascular: Negative.for cp or palpitations   Gastrointestinal: Negative for nausea, diarrhea and constipation.  Genitourinary: Negative for urgency and frequency.  Skin: Negative for pallor. or rash  Neurological: Negative for weakness, light-headedness, numbness and headaches.  Hematological: Negative for adenopathy. Does not bruise/bleed easily.  Psychiatric/Behavioral: Negative for dysphoric mood. The patient is not nervous/anxious.         Objective:   Physical Exam  Constitutional: She appears well-developed and well-nourished. No distress.       overwt and well appearing   HENT:  Head: Normocephalic and atraumatic.  Right Ear: External ear normal.  Left Ear: External ear normal.  Mouth/Throat: Oropharynx is clear and moist.        Mildly tender frontal sinuses - per pt improved  Nares boggy and injected  Eyes: Conjunctivae and EOM are normal. Pupils are equal, round, and reactive to light.  Neck: Normal range of motion. Neck supple. No thyromegaly present.  Cardiovascular: Normal rate and regular rhythm.   Pulmonary/Chest: Effort normal and breath sounds normal. No respiratory distress. She has no wheezes. She has no rales. She exhibits no tenderness.       No wheeze even on forced exp Harsh bs   Lymphadenopathy:    She has no cervical adenopathy.  Neurological: She is alert. She has normal reflexes. No cranial nerve deficit.  Skin: Skin is warm and dry. No rash noted. No pallor.  Psychiatric: She has a normal mood and affect.          Assessment & Plan:

## 2010-12-24 ENCOUNTER — Encounter: Payer: Self-pay | Admitting: Family Medicine

## 2010-12-27 ENCOUNTER — Telehealth: Payer: Self-pay | Admitting: *Deleted

## 2010-12-27 NOTE — Telephone Encounter (Signed)
I think she has had a course of omnicef- correct me if I'm wrong Please set her up for cxr pa and lat here when she can come (to see if any changes )- then will advise further

## 2010-12-27 NOTE — Telephone Encounter (Signed)
Left message on machine to call back  

## 2010-12-27 NOTE — Telephone Encounter (Signed)
Patient was seen last week with bronchitis symptoms. She says that she isn't doing anybetter. Still coughing all night long. She says that she can't tell if she is still wheezing. She says that she is concerned because she has had two heart surgeries and when she coughs nonstop her heart rate goes up. She is asking if she should start antibiotic at this point. Also is asking if you think she should go to a pulmonologist. Please advise.

## 2010-12-28 NOTE — Telephone Encounter (Signed)
Good , thanks -- will see her tomorrow am

## 2010-12-28 NOTE — Telephone Encounter (Signed)
Patient notified as instructed by telephone. Was informed by patient that she was on an antibiotic but does not recall the name of it. Patient stated that she stopped taking it because it was making her sick. Patient stated that she is doing better today, but still has a cough. Patient stated that she has a follow-up appointment scheduled with Dr. Milinda Antis tomorrow (Wednesday am) and will talk about having a chest x-ray when she is here for that appointment.

## 2010-12-29 ENCOUNTER — Ambulatory Visit (INDEPENDENT_AMBULATORY_CARE_PROVIDER_SITE_OTHER): Payer: Managed Care, Other (non HMO) | Admitting: Family Medicine

## 2010-12-29 ENCOUNTER — Encounter: Payer: Self-pay | Admitting: Family Medicine

## 2010-12-29 DIAGNOSIS — E785 Hyperlipidemia, unspecified: Secondary | ICD-10-CM

## 2010-12-29 DIAGNOSIS — E119 Type 2 diabetes mellitus without complications: Secondary | ICD-10-CM

## 2010-12-29 DIAGNOSIS — E079 Disorder of thyroid, unspecified: Secondary | ICD-10-CM

## 2010-12-29 DIAGNOSIS — J209 Acute bronchitis, unspecified: Secondary | ICD-10-CM

## 2010-12-29 MED ORDER — SIMVASTATIN 20 MG PO TABS: 20.0000 mg | ORAL_TABLET | Freq: Every day | ORAL | Status: DC

## 2010-12-29 MED ORDER — BENZONATATE 100 MG PO CAPS: 100.0000 mg | ORAL_CAPSULE | Freq: Three times a day (TID) | ORAL | Status: DC | PRN

## 2010-12-29 MED ORDER — LEVOTHYROXINE SODIUM 75 MCG PO TABS: 75.0000 ug | ORAL_TABLET | Freq: Every day | ORAL | Status: DC

## 2010-12-29 NOTE — Assessment & Plan Note (Signed)
We will need a1c at next check Disc plan for exercise - early am walking  Also low glycemic diet

## 2010-12-29 NOTE — Assessment & Plan Note (Signed)
Improving with persistant dry cough that is improving  Will try tessalon for cough since she is intolerant to narcotics  No bacterial red flags - will hold off on cxr or omnicef for now  Will keep me closely updated  If worse - consider those

## 2010-12-29 NOTE — Progress Notes (Signed)
Subjective:    Patient ID: Haley Young, female    DOB: 05/05/1951, 60 y.o.   MRN: [id number]  HPI Here for f/u of bronchitis and chronic med problems (hypothyroid and DM and lipids )  Also had uric acid 7.1  Dm- sugar 147- fasting  Has been eating lots of fruits and veg  No breads / no pasta or rice / some corn  Has a garden -- which is great   No real exercise aside from work  Wants to start walking early in am   Last a1c was 7  Lipids high with trig 152 and HDL53 and LDL 157  On zocor- not taking  Forgot to take    tsh hich at 7.8 On synthroid-not taking thyroid med  Forgets to take it     cough persists  Had bronchitis and sinusitis and took omnicef and also hycodan Coughed all day Monday and it really wore her out (made her throw up once)  Cough syrup made her sick and dizzy - so cannot take it  Thinks now she is turning the corner Since we thought it was viral so did not finish her omnicef  Is always worse at night  No fever  Mucous - not really coughing up anything Not getting worse    Patient Active Problem List  Diagnoses  . THYROID STIMULATING HORMONE, ABNORMAL  . DIABETES-TYPE 2  . UNSPECIFIED VITAMIN D DEFICIENCY  . HYPERLIPIDEMIA  . DEPRESSION  . PULMONARY VALVE INSUFFICIENCY  . GERD  . OSTEOPOROSIS  . FATIGUE  . HYPERHIDROSIS  . URINARY INCONTINENCE  . RUQ PAIN  . COLONIC POLYPS, HX OF  . Bronchitis, acute  . Acute sinusitis   Past Medical History  Diagnosis Date  . 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 GERD (gastroesophageal reflux disease)   . Depression   . Urinary incontinence   . Hx of colonic polyps   . Osteoporosis   . Diabetes mellitus   . Lipoma     R axilla   . Hyperhidrosis     especially with hot flashes    . Frequent UTI   . Pyelocystitis     as a child    Past Surgical History  Procedure Date  . Partial hysterectomy 1970  . Cardiac surgery 1955, 2005  . Valve replacement   .  Pulmonary vein stenosis repair    History  Substance Use Topics  . Smoking status: Never Smoker   . Smokeless tobacco: Never Used  . Alcohol Use: Yes     occasional wine    Family History  Problem Relation Age of Onset  . Diabetes Mother   . COPD Mother   . Obesity Mother   . Depression Mother   . Alcohol abuse Father   . Cirrhosis Father   . Diabetes Sister   . Down syndrome Brother   . Down syndrome Daughter   . Lupus Daughter   . Ulcerative colitis Daughter   . Coarctation of the aorta Sister    Allergies  Allergen Reactions  . Hycodan (Hydrocodone-Homatropine)   . Oxycodone Hcl     REACTION: hallucinations  . Oxycontin Other (See Comments)    halucinations   Current Outpatient Prescriptions on File Prior to Visit  Medication Sig Dispense Refill  . albuterol (PROAIR HFA) 108 (90 BASE) MCG/ACT inhaler Inhale 1-2 puffs into the lungs every 4 (four) hours as needed.        Marland Kitchen  aspirin 81 MG tablet Take 81 mg by mouth daily.        . Cholecalciferol 2000 UNITS TABS Take 2 tablets by mouth once a day       . FLUoxetine (PROZAC) 40 MG capsule Take 40 mg by mouth daily.        . fluticasone (FLONASE) 50 MCG/ACT nasal spray Place 2 sprays into the nose 2 (two) times daily.        . Multiple Vitamin (MULTIVITAMIN) tablet Take 1 tablet by mouth daily.            Review of Systems Review of Systems  Constitutional: Negative for fever, appetite change, fatigue and unexpected weight change.  Eyes: Negative for pain and visual disturbance.  Respiratory: Negative  and shortness of breath.  pos for cough that is improving Cardiovascular: Negative.  for cp or palpitations Gastrointestinal: Negative for nausea, diarrhea and constipation.  Genitourinary: Negative for urgency and frequency.  Skin: Negative for pallor.  Neurological: Negative for weakness, light-headedness, numbness and headaches.  Hematological: Negative for adenopathy. Does not bruise/bleed easily.    Psychiatric/Behavioral: Negative for dysphoric mood. The patient is not nervous/anxious.          Objective:   Physical Exam  Constitutional: She appears well-developed and well-nourished. No distress.       overwt and well appearing   HENT:  Head: Normocephalic and atraumatic.  Right Ear: External ear normal.  Left Ear: External ear normal.  Nose: Nose normal.  Mouth/Throat: Oropharynx is clear and moist.  Eyes: Conjunctivae and EOM are normal. Pupils are equal, round, and reactive to light.  Neck: Normal range of motion. Neck supple. No JVD present. No thyromegaly present.  Cardiovascular: Normal rate, regular rhythm, normal heart sounds and intact distal pulses.   Pulmonary/Chest: Effort normal and breath sounds normal. No respiratory distress. She has no wheezes. She has no rales. She exhibits no tenderness.  Abdominal: Soft. Bowel sounds are normal. There is no tenderness.  Musculoskeletal: She exhibits no edema.  Lymphadenopathy:    She has no cervical adenopathy.  Neurological: She is alert. She has normal reflexes.       No tremor   Skin: Skin is warm and dry. No rash noted. No erythema. No pallor.  Psychiatric: She has a normal mood and affect.          Assessment & Plan:

## 2010-12-29 NOTE — Assessment & Plan Note (Signed)
High LDL 157 Rev labs with pt  Rev low sat fat diet Start back on zocor Lab plan 6 wk  Update if side eff

## 2010-12-29 NOTE — Assessment & Plan Note (Signed)
Pt not taking synthroid  Disc imp of this  Will get back on it  Plan lab 6 weeks

## 2010-12-29 NOTE — Patient Instructions (Signed)
Get started back on medicines  Try tessalon for cough  Get a pill box with the days of the week You need labs in 6 weeks -- call and schedule here if you can get it here or let me know if you need an order for [facility]  Avoid red meat/ fried foods/ egg yolks/ fatty breakfast meats/ butter, cheese and high fat dairy/ and shellfish   Eat low glycemic diet also

## 2011-07-25 ENCOUNTER — Inpatient Hospital Stay: Payer: Self-pay | Admitting: Internal Medicine

## 2011-07-25 LAB — CBC
HCT: 45.7 % (ref 35.0–47.0)
HGB: 15.3 g/dL (ref 12.0–16.0)
MCH: 29.7 pg (ref 26.0–34.0)
MCHC: 33.4 g/dL (ref 32.0–36.0)
Platelet: 260 10*3/uL (ref 150–440)
WBC: 9.3 10*3/uL (ref 3.6–11.0)

## 2011-07-25 LAB — URINALYSIS, COMPLETE
Blood: NEGATIVE
Hyaline Cast: 14
Nitrite: NEGATIVE
Ph: 5 (ref 4.5–8.0)
Protein: 100
Specific Gravity: 1.024 (ref 1.003–1.030)
Squamous Epithelial: 7
WBC UR: 3 /HPF (ref 0–5)

## 2011-07-25 LAB — PRO B NATRIURETIC PEPTIDE: B-Type Natriuretic Peptide: 156 pg/mL — ABNORMAL HIGH (ref 0–125)

## 2011-07-25 LAB — CREATININE, SERUM
EGFR (African American): >60
EGFR (Non-African Amer.): >60

## 2011-07-26 LAB — CBC WITH DIFFERENTIAL/PLATELET
Basophil #: 0 10*3/uL (ref 0.0–0.1)
Eosinophil %: 0 %
HCT: 40.6 % (ref 35.0–47.0)
MCH: 29.9 pg (ref 26.0–34.0)
Monocyte %: 2.6 %
Neutrophil %: 88.5 %
Platelet: 232 10*3/uL (ref 150–440)
RBC: 4.58 10*6/uL (ref 3.80–5.20)
WBC: 9.2 10*3/uL (ref 3.6–11.0)

## 2011-07-26 LAB — BASIC METABOLIC PANEL
Calcium, Total: 9.5 mg/dL (ref 8.5–10.1)
Chloride: 97 mmol/L — ABNORMAL LOW (ref 98–107)
Co2: 27 mmol/L (ref 21–32)
Creatinine: 0.75 mg/dL (ref 0.60–1.30)
EGFR (African American): >60
EGFR (Non-African Amer.): >60
Glucose: 411 mg/dL — ABNORMAL HIGH (ref 65–99)
Osmolality: 293 (ref 275–301)
Potassium: 4.7 mmol/L (ref 3.5–5.1)
Sodium: 136 mmol/L (ref 136–145)

## 2011-07-26 LAB — HEMOGLOBIN A1C: Hemoglobin A1C: 9.3 % — ABNORMAL HIGH (ref 4.2–6.3)

## 2011-07-27 LAB — BASIC METABOLIC PANEL
Anion Gap: 12 (ref 7–16)
BUN: 21 mg/dL — ABNORMAL HIGH (ref 7–18)
Calcium, Total: 9.2 mg/dL (ref 8.5–10.1)
Co2: 29 mmol/L (ref 21–32)
Creatinine: 0.65 mg/dL (ref 0.60–1.30)
EGFR (African American): >60

## 2011-07-29 ENCOUNTER — Telehealth: Payer: Self-pay | Admitting: *Deleted

## 2011-07-29 DIAGNOSIS — IMO0001 Reserved for inherently not codable concepts without codable children: Secondary | ICD-10-CM | POA: Insufficient documentation

## 2011-07-29 NOTE — Telephone Encounter (Signed)
I reviewed her note- she was dx with copd and bronchitis -- if she feels poorly can schedule to see me early next week  If severe sob- go to ER of course  Bring her meds with her at that visit from discharge  If fever / increased cough - alert me  Can also sched for sat clinic if needed

## 2011-07-29 NOTE — Telephone Encounter (Signed)
Patient notified as instructed by telephone and pt will wait to hear from pt care coordinator.

## 2011-07-29 NOTE — Telephone Encounter (Signed)
Patient notified as instructed by telephone. Pt said she does not want appt to be seen here pt already has appt to see Dr Park Breed pulmonologist in [place] on 07/31/11 at 11:15 am for swollen lungs. Pt said she just needs referral done to Dr Park Breed. Pt already has appt. Pt will wait to hear from pt care coordinator and can be reached at [phone].

## 2011-07-29 NOTE — Telephone Encounter (Signed)
I will do referral to pulm for copd for 1/20-- Dr Park Breed

## 2011-07-29 NOTE — Telephone Encounter (Signed)
Patient release from hospital on Wednesday and was referral to pulmonology for swelling of lungs and can not be seen until next Thursday and patient wants to know if she should wait that long. Should she come in to see you before then. ( Patient says she feels the tightness in her chest)

## 2011-08-02 LAB — EXPECTORATED SPUTUM ASSESSMENT W GRAM STAIN, RFLX TO RESP C

## 2011-08-05 ENCOUNTER — Ambulatory Visit: Payer: Managed Care, Other (non HMO) | Admitting: Family Medicine

## 2011-08-08 ENCOUNTER — Encounter: Payer: Self-pay | Admitting: Family Medicine

## 2011-08-08 ENCOUNTER — Ambulatory Visit (INDEPENDENT_AMBULATORY_CARE_PROVIDER_SITE_OTHER): Payer: BC Managed Care – PPO | Admitting: Family Medicine

## 2011-08-08 VITALS — BP 116/70 | HR 96 | Temp 98.5°F | Ht 61.0 in | Wt 166.8 lb

## 2011-08-08 DIAGNOSIS — J209 Acute bronchitis, unspecified: Secondary | ICD-10-CM

## 2011-08-08 DIAGNOSIS — E785 Hyperlipidemia, unspecified: Secondary | ICD-10-CM

## 2011-08-08 DIAGNOSIS — R4589 Other symptoms and signs involving emotional state: Secondary | ICD-10-CM

## 2011-08-08 DIAGNOSIS — J449 Chronic obstructive pulmonary disease, unspecified: Secondary | ICD-10-CM

## 2011-08-08 DIAGNOSIS — E119 Type 2 diabetes mellitus without complications: Secondary | ICD-10-CM

## 2011-08-08 DIAGNOSIS — F439 Reaction to severe stress, unspecified: Secondary | ICD-10-CM | POA: Insufficient documentation

## 2011-08-08 DIAGNOSIS — IMO0001 Reserved for inherently not codable concepts without codable children: Secondary | ICD-10-CM
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
## 2011-08-08 NOTE — Assessment & Plan Note (Signed)
Rev imp of this control with DM Rev low sat fat diet Will check this fasting in 3 mo and then f/u  Lab Results  Component Value Date   LDLCALC 110* 10/27/2008

## 2011-08-08 NOTE — Progress Notes (Signed)
Subjective:    Patient ID: Haley Young, female    DOB: 07-Dec-1950, 61 y.o.   MRN: [id number]  HPI Pt is here for f/u of hospitalization for acute bronchitis with reactive airways and hypoxia Had several cxr showing no acute dz as well as nl VQ scan while there  N/v - had due to augmentin - stopped with change to zosyn Had prev been tx with 2 rounds of zpak Given prednisone Already had high a1c of 9.3 - then put on ss insulin and lantus inc/ metformin added Was d/c on 5 addl days of levaquin/ also advair and prednisone  Did give a sputum culture at hosp- I have not been sent a result yet  appt was made with pulm Dr Joylene Draft at [facility]  to investigate the reactive airways - asthma vs copd  Will be seen in mid feb there   Is feeling tired now in general  Is done with prednisone and levquin now - still using advair and , combivent ( as needed during the day-- usually about twice daily )  Also on spiriva now  Never smoked  Was exposed to smoke   Diabetes  Told to take 25 units of lantus qd  Also novolog sliding scale  Got confused last night and did not use the lantus   Has done diabetic teaching (can go back for 2 hour review)- and [facility] from [facility] called her and reviewed some things Is following DM diet best she can remember Watching carbs- no white foods  Has lost a little weight  Protein - meat , yogurt low fat , and dried beans  Was not checking sugar when she went in to the hospital   No exercise yet - plans to start walking a dog   Metformin 1000 mg twice daily - no problems  lantus 25 units once daily  Sliding scale novolog   Stress- work is very stressful -- wants to quit  Took a month off  Mother was in rest home - bad situation  Kids are closer- however - in group homes  No one in family will help her -this is very stressful  Has completely neglected herself and been in denial of all her medical problems for the past year Thinks that talking to a  counselor would be a good idea  Patient Active Problem List  Diagnoses  . THYROID STIMULATING HORMONE, ABNORMAL  . DIABETES-TYPE 2  . UNSPECIFIED VITAMIN D DEFICIENCY  . HYPERLIPIDEMIA  . DEPRESSION  . PULMONARY VALVE INSUFFICIENCY  . GERD  . OSTEOPOROSIS  . FATIGUE  . HYPERHIDROSIS  . URINARY INCONTINENCE  . RUQ PAIN  . COLONIC POLYPS, HX OF  . Bronchitis, acute  . Acute sinusitis  . COPD with bronchitis  . Stress reaction, emotional   Past Medical History  Diagnosis Date  . 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 GERD (gastroesophageal reflux disease)   . Depression   . Urinary incontinence   . Hx of colonic polyps   . Osteoporosis   . Diabetes mellitus   . Lipoma     R axilla   . Hyperhidrosis     especially with hot flashes    . Frequent UTI   . Pyelocystitis     as a child    Past Surgical History  Procedure Date  . Partial hysterectomy 1970  . Cardiac surgery 1955, 2005  . Valve replacement   . Pulmonary vein stenosis repair  History  Substance Use Topics  . Smoking status: Never Smoker   . Smokeless tobacco: Never Used  . Alcohol Use: Yes     occasional wine    Family History  Problem Relation Age of Onset  . Diabetes Mother   . COPD Mother   . Obesity Mother   . Depression Mother   . Alcohol abuse Father   . Cirrhosis Father   . Diabetes Sister   . Down syndrome Brother   . Down syndrome Daughter   . Lupus Daughter   . Ulcerative colitis Daughter   . Coarctation of the aorta Sister    Allergies  Allergen Reactions  . Hycodan (Hydrocodone-Homatropine)   . Oxycodone Hcl     REACTION: hallucinations  . Oxycontin Other (See Comments)    halucinations   Current Outpatient Prescriptions on File Prior to Visit  Medication Sig Dispense Refill  . albuterol (PROAIR HFA) 108 (90 BASE) MCG/ACT inhaler Inhale 1-2 puffs into the lungs every 4 (four) hours as needed.        Marland Kitchen aspirin 81 MG tablet Take 81 mg by mouth  daily.        . Multiple Vitamin (MULTIVITAMIN) tablet Take 1 tablet by mouth daily.        . benzonatate (TESSALON PERLES) 100 MG capsule Take 1 capsule (100 mg total) by mouth 3 (three) times daily as needed for cough (swallow whole- do not chew ).  30 capsule  1  . Cholecalciferol 2000 UNITS TABS Take 2 tablets by mouth once a day       . FLUoxetine (PROZAC) 40 MG capsule Take 40 mg by mouth daily.        . fluticasone (FLONASE) 50 MCG/ACT nasal spray Place 2 sprays into the nose 2 (two) times daily.        Marland Kitchen levothyroxine (SYNTHROID) 75 MCG tablet Take 1 tablet (75 mcg total) by mouth daily.  30 tablet  11  . simvastatin (ZOCOR) 20 MG tablet Take 1 tablet (20 mg total) by mouth at bedtime. 1 by mouth once daily in evening for cholesterol  30 tablet  11        Review of Systems Review of Systems  Constitutional: Negative for fever, appetite change,  and unexpected weight change. pos for fatigue  Eyes: Negative for pain and visual disturbance.  Respiratory: Negative for wheeze or sob/ pos for cough that is improved  Cardiovascular: Negative for cp or palpitations    Gastrointestinal: Negative for nausea, diarrhea and constipation.  Genitourinary: Negative for urgency and frequency. no excessive thirst  Skin: Negative for pallor or rash   Neurological: Negative for weakness, light-headedness, numbness and headaches.  Hematological: Negative for adenopathy. Does not bruise/bleed easily.  Psychiatric/Behavioral: Negative for dysphoric mood. The patient is anxious and under enormous stress, no SI        Objective:   Physical Exam  Constitutional: She appears well-developed and well-nourished. No distress.       overwt and fatigued appearing   HENT:  Head: Normocephalic and atraumatic.  Mouth/Throat: Oropharynx is clear and moist.       Nares are boggy No sinus tenderness   Eyes: Conjunctivae and EOM are normal. Pupils are equal, round, and reactive to light. Right eye exhibits no  discharge. Left eye exhibits no discharge.  Neck: Normal range of motion. Neck supple. No JVD present. Carotid bruit is not present. No thyromegaly present.  Cardiovascular: Normal rate, regular rhythm and intact distal pulses.  Exam reveals no gallop.   Murmur heard. Pulmonary/Chest: Effort normal and breath sounds normal. No respiratory distress. She has no wheezes. She has no rales. She exhibits no tenderness.       Distant and harsh bs throughout No wheeze - even on forced expiration  Abdominal: Soft. Bowel sounds are normal. She exhibits no distension, no abdominal bruit and no mass. There is no tenderness.  Musculoskeletal: She exhibits no edema and no tenderness.  Lymphadenopathy:    She has no cervical adenopathy.  Neurological: She is alert. She has normal reflexes. She displays no tremor. No cranial nerve deficit. She exhibits normal muscle tone. Coordination normal.  Skin: Skin is warm and dry. No rash noted. No erythema. No pallor.  Psychiatric: Her behavior is normal. Judgment and thought content normal.       Pt is fatigued and anxious in general Good eye contact and comm skills Talks freely of stressors Tearful at times           Assessment & Plan:

## 2011-08-08 NOTE — Assessment & Plan Note (Signed)
Reviewed hospital records in detail with pt - reassuring labs / studies, neg VQ scan  Pt has f/u with pulmonary at [facility] upcoming  Improved exam today and symptoms much imp after finishing abx and steroids  Remains on spiriva and combivent  Update if not starting to improve in a week or if worsening

## 2011-08-08 NOTE — Assessment & Plan Note (Signed)
This has been neglected for quite a while due to lack of time for self care  Working on stress issues currently and motivated to start paying attention  Rev low glycemic diet -has done education in past  Adv to continue metformin and cut lantus to 12 u (in light of some nl sugars with ss) Will continue ss insulin for next several weeks until prednisone gets out of system Rev plan for exercise and wt loss Lab and f/u 3 mo  Will update if hypoglycemia

## 2011-08-08 NOTE — Patient Instructions (Signed)
For diabetes - take the metformin without change Decrease lantus to 12 units once daily  Continue sliding scale for another 2 weeks  udpate me if staying high or low  Follow diabetic diet  Try to start exercise when you are able  Update if not starting to improve in a week or if worsening   Schedule fasting lab 3 months and then follow up  We will do counseling referral at check out for stress

## 2011-08-10 ENCOUNTER — Institutional Professional Consult (permissible substitution): Payer: Managed Care, Other (non HMO) | Admitting: Internal Medicine

## 2011-08-22 ENCOUNTER — Telehealth: Payer: Self-pay | Admitting: Family Medicine

## 2011-08-22 NOTE — Telephone Encounter (Signed)
I need to see her for that - since it represents such a long time- and sounds like we need to check in to see how she is doing  sched f/u when able

## 2011-08-22 NOTE — Telephone Encounter (Signed)
Patient has been out of work for the past month with acute bronchitis.  She's still coughing and has a raspy voice and she's not feeling well and she's weak.  Patient would like to get a doctor's note for her to be out of work from 08/28/11 - 09/25/11.

## 2011-08-23 NOTE — Telephone Encounter (Signed)
Patient notified as instructed by telephone. Pt said non productive cough is worse. Pt scheduled appt with Dr Milinda Antis 08/24/11 at 8:15am.

## 2011-08-24 ENCOUNTER — Ambulatory Visit (INDEPENDENT_AMBULATORY_CARE_PROVIDER_SITE_OTHER): Payer: BC Managed Care – PPO | Admitting: Family Medicine

## 2011-08-24 ENCOUNTER — Ambulatory Visit (INDEPENDENT_AMBULATORY_CARE_PROVIDER_SITE_OTHER)
Admission: RE | Admit: 2011-08-24 | Discharge: 2011-08-24 | Disposition: A | Discharge status: Home / Self Care | Payer: BC Managed Care – PPO | Source: Ambulatory Visit | Attending: Family Medicine | Admitting: Family Medicine

## 2011-08-24 ENCOUNTER — Encounter: Payer: Self-pay | Admitting: Family Medicine

## 2011-08-24 VITALS — BP 100/70 | HR 116 | Temp 98.1°F | Resp 20 | Ht 61.0 in | Wt 168.0 lb

## 2011-08-24 DIAGNOSIS — J449 Chronic obstructive pulmonary disease, unspecified: Secondary | ICD-10-CM

## 2011-08-24 DIAGNOSIS — R5381 Other malaise: Secondary | ICD-10-CM

## 2011-08-24 DIAGNOSIS — J209 Acute bronchitis, unspecified: Secondary | ICD-10-CM

## 2011-08-24 LAB — CBC WITH DIFFERENTIAL/PLATELET
Basophils Absolute: 0 10*3/uL (ref 0.0–0.1)
Eosinophils Absolute: 0.5 10*3/uL (ref 0.0–0.7)
Eosinophils Relative: 4.2 % (ref 0.0–5.0)
Lymphs Abs: 1.7 10*3/uL (ref 0.7–4.0)
MCV: 89.9 fl (ref 78.0–100.0)
Monocytes Absolute: 0.7 10*3/uL (ref 0.1–1.0)
Neutrophils Relative %: 74.5 % (ref 43.0–77.0)
Platelets: 314 10*3/uL (ref 150.0–400.0)
RDW: 13.4 % (ref 11.5–14.6)
WBC: 11.6 10*3/uL — ABNORMAL HIGH (ref 4.5–10.5)

## 2011-08-24 LAB — TSH: TSH: 7.23 u[IU]/mL — ABNORMAL HIGH (ref 0.35–5.50)

## 2011-08-24 MED ORDER — HYDROCOD POLST-CHLORPHEN POLST 10-8 MG/5ML PO LQCR: 5.0000 mL | Freq: Two times a day (BID) | ORAL | Status: DC | PRN

## 2011-08-24 NOTE — Assessment & Plan Note (Signed)
Has been multifactorial- but now much worse since sick again  Lab today along with cxr and update Adv rest  Is also sched to see counselor for stress

## 2011-08-24 NOTE — Patient Instructions (Signed)
Rest / fluids tussionex for cough with caution  Continue your other medicines - stay on advair until you see pulmonary back  Update if not starting to improve in a week or if worsening   We will update you about chest xray and labs when results return

## 2011-08-24 NOTE — Progress Notes (Signed)
Subjective:    Patient ID: Haley Young, female    DOB: [birth year], 61 y.o.   MRN: [id number]  HPI Here with ongoing cough and fatigue  At last visit-end of jan , f/u for post hosp for pneumonia and copd Was to see pulm at [facility] and was told she had no copd -- did pfts  Dx with acute bronchitis  S/p abx and prednisone Now on advair/ combivent    No longer spiriva Cough is prod of yellow phlegm Po is 95% today  Was getting better -- overall 3-4 days/ went to church -- felt pretty good  All the sudden cough started again -- Monday  Yesterday tired / malaise  achey  ? If fever  No chills/ sweats No sob/ wheeze    Still tired  Very weak in general  Patient Active Problem List  Diagnoses  . THYROID STIMULATING HORMONE, ABNORMAL  . DIABETES-TYPE 2  . UNSPECIFIED VITAMIN D DEFICIENCY  . HYPERLIPIDEMIA  . DEPRESSION  . PULMONARY VALVE INSUFFICIENCY  . GERD  . OSTEOPOROSIS  . FATIGUE  . HYPERHIDROSIS  . URINARY INCONTINENCE  . COLONIC POLYPS, HX OF  . Stress reaction, emotional  . Acute bronchitis   Past Medical History  Diagnosis Date  . 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 GERD (gastroesophageal reflux disease)   . Depression   . Urinary incontinence   . Hx of colonic polyps   . Osteoporosis   . Diabetes mellitus   . Lipoma     R axilla   . Hyperhidrosis     especially with hot flashes    . Frequent UTI   . Pyelocystitis     as a child    Past Surgical History  Procedure Date  . Partial hysterectomy 1970  . Cardiac surgery 1955, 2005  . Valve replacement   . Pulmonary vein stenosis repair    History  Substance Use Topics  . Smoking status: Never Smoker   . Smokeless tobacco: Never Used  . Alcohol Use: Yes     occasional wine    Family History  Problem Relation Age of Onset  . Diabetes Mother   . COPD Mother   . Obesity Mother   . Depression Mother   . Alcohol abuse Father   . Cirrhosis Father   . Diabetes Sister     . Down syndrome Brother   . Down syndrome Daughter   . Lupus Daughter   . Ulcerative colitis Daughter   . Coarctation of the aorta Sister    Allergies  Allergen Reactions  . Oxycodone Hcl     REACTION: hallucinations  . Oxycontin Other (See Comments)    halucinations   Current Outpatient Prescriptions on File Prior to Visit  Medication Sig Dispense Refill  . ADVAIR DISKUS 250-50 MCG/DOSE AEPB 1 puff Twice daily.      Marland Kitchen albuterol (PROAIR HFA) 108 (90 BASE) MCG/ACT inhaler Inhale 1-2 puffs into the lungs every 4 (four) hours as needed.        Marland Kitchen aspirin 81 MG tablet Take 81 mg by mouth daily.        Effie Berkshire 18-103 MCG/ACT inhaler 1 puff as needed.      Marland Kitchen levothyroxine (SYNTHROID) 75 MCG tablet Take 1 tablet (75 mcg total) by mouth daily.  30 tablet  11  . metFORMIN (GLUCOPHAGE) 1000 MG tablet Take 1 tablet by mouth Twice daily.      . Multiple Vitamin (  MULTIVITAMIN) tablet Take 1 tablet by mouth daily.        . pantoprazole (PROTONIX) 40 MG tablet Take 1 tablet by mouth as directed.      . simvastatin (ZOCOR) 20 MG tablet Take 1 tablet (20 mg total) by mouth at bedtime. 1 by mouth once daily in evening for cholesterol  30 tablet  11  . ALPRAZolam (XANAX) 0.25 MG tablet Take 1 tablet by mouth.      . benzonatate (TESSALON PERLES) 100 MG capsule Take 1 capsule (100 mg total) by mouth 3 (three) times daily as needed for cough (swallow whole- do not chew ).  30 capsule  1  . Cholecalciferol 2000 UNITS TABS Take 2 tablets by mouth once a day       . FLUoxetine (PROZAC) 40 MG capsule Take 40 mg by mouth daily.        . fluticasone (FLONASE) 50 MCG/ACT nasal spray Place 2 sprays into the nose 2 (two) times daily.        Marland Kitchen LANTUS SOLOSTAR 100 UNIT/ML injection 12 Units Daily. unit      . NOVOLOG FLEXPEN 100 UNIT/ML injection as directed.      Marland Kitchen SPIRIVA HANDIHALER 18 MCG inhalation capsule 1 puff Daily.           Review of Systems Review of Systems  Constitutional: Negative for fever,  appetite change, and unexpected weight change.  Eyes: Negative for pain and visual disturbance ENT pos for some rhinorrhea and cong without sinus pain   Respiratory: Negative for sob/ wheeze and pos for persistent cough  Cardiovascular: Negative for cp or palpitations    Gastrointestinal: Negative for nausea, diarrhea and constipation.  Genitourinary: Negative for urgency and frequency.  Skin: Negative for pallor or rash   Neurological: Negative for weakness, light-headedness, numbness and headaches.  Hematological: Negative for adenopathy. Does not bruise/bleed easily.  Psychiatric/Behavioral: Negative for dysphoric mood. The patient is not nervous/anxious.  is under much stress        Objective:   Physical Exam  Constitutional: She appears well-developed and well-nourished. No distress.       overwt and well appearing  Does seem fatigued   HENT:  Head: Normocephalic and atraumatic.  Right Ear: External ear normal.  Left Ear: External ear normal.  Mouth/Throat: Oropharynx is clear and moist.       Nares are injected and congested  No sinus tenderness   Eyes: Conjunctivae and EOM are normal. Pupils are equal, round, and reactive to light. Right eye exhibits no discharge. Left eye exhibits no discharge. No scleral icterus.  Neck: Normal range of motion. Neck supple. No JVD present. Carotid bruit is not present. No thyromegaly present.  Cardiovascular: Normal rate, regular rhythm, normal heart sounds and intact distal pulses.  Exam reveals no gallop.   Pulmonary/Chest: Effort normal and breath sounds normal. No respiratory distress. She has no wheezes.       Harsh bs at bases , no rales or rhonchi or wheeze Not sob  Abdominal: Soft. Bowel sounds are normal. She exhibits no distension and no mass. There is no tenderness.  Lymphadenopathy:    She has no cervical adenopathy.  Neurological: She is alert. She has normal reflexes. No cranial nerve deficit. She exhibits normal muscle tone.  Coordination normal.  Skin: Skin is warm and dry. No rash noted. No erythema. No pallor.  Psychiatric: She has a normal mood and affect.       Nl affect but seems stressed  and fatigued Not tearful          Assessment & Plan:

## 2011-08-24 NOTE — Assessment & Plan Note (Signed)
Per pulm at [facility]- not copd , just asthmatic bronchitis Worrisome she got better ,then worse  Re assuring exam  Will continue pulm meds tussionex for cough  cxr today as well as cbc -then adv Update if not starting to improve in a week or if worsening

## 2011-08-26 ENCOUNTER — Telehealth: Payer: Self-pay | Admitting: Family Medicine

## 2011-08-26 NOTE — Telephone Encounter (Signed)
Pt called, need results of labs, Call back # [phone]

## 2011-08-26 NOTE — Telephone Encounter (Signed)
Patient notified as instructed by telephone. Please see result note with Dr Lucretia Roers response dated 08/25/11.

## 2011-08-30 ENCOUNTER — Ambulatory Visit: Payer: BC Managed Care – PPO | Admitting: Psychology

## 2011-09-06 ENCOUNTER — Ambulatory Visit (INDEPENDENT_AMBULATORY_CARE_PROVIDER_SITE_OTHER): Payer: BC Managed Care – PPO | Admitting: Psychology

## 2011-09-06 DIAGNOSIS — F4323 Adjustment disorder with mixed anxiety and depressed mood: Secondary | ICD-10-CM

## 2011-09-14 ENCOUNTER — Ambulatory Visit (INDEPENDENT_AMBULATORY_CARE_PROVIDER_SITE_OTHER): Payer: BC Managed Care – PPO | Admitting: Psychology

## 2011-09-14 DIAGNOSIS — F4323 Adjustment disorder with mixed anxiety and depressed mood: Secondary | ICD-10-CM

## 2011-09-20 ENCOUNTER — Other Ambulatory Visit: Payer: Self-pay | Admitting: Family Medicine

## 2011-09-22 NOTE — Telephone Encounter (Signed)
[facility] request refill protonix 40 mg #30 x 6, Spiriva # 1 x 3 and metformin 1000 mg # 60 x 6.

## 2011-10-05 ENCOUNTER — Ambulatory Visit: Payer: BC Managed Care – PPO | Admitting: Psychology

## 2011-10-14 ENCOUNTER — Other Ambulatory Visit: Payer: Self-pay

## 2011-10-14 MED ORDER — ALPRAZOLAM 0.25 MG PO TABS: 0.2500 mg | ORAL_TABLET | Freq: Every day | ORAL | Status: DC | PRN

## 2011-10-14 NOTE — Telephone Encounter (Signed)
[facility], patient notified as instructed via telephone. 

## 2011-10-14 NOTE — Telephone Encounter (Signed)
Pt said feeling anxious and uptight would like refill on alprazolam 0.25mg  called to [facility]. Pt last seen 08/24/11.

## 2011-10-14 NOTE — Telephone Encounter (Signed)
Px written for call in   

## 2011-10-31 ENCOUNTER — Other Ambulatory Visit (INDEPENDENT_AMBULATORY_CARE_PROVIDER_SITE_OTHER): Payer: BC Managed Care – PPO

## 2011-10-31 DIAGNOSIS — E119 Type 2 diabetes mellitus without complications: Secondary | ICD-10-CM

## 2011-10-31 DIAGNOSIS — E785 Hyperlipidemia, unspecified: Secondary | ICD-10-CM

## 2011-10-31 LAB — LIPID PANEL
HDL: 45.9 mg/dL (ref >39.00)
Total CHOL/HDL Ratio: 5
VLDL: 26.4 mg/dL (ref 0.0–40.0)

## 2011-10-31 LAB — COMPREHENSIVE METABOLIC PANEL
AST: 27 U/L (ref 0–37)
Albumin: 4.3 g/dL (ref 3.5–5.2)
Alkaline Phosphatase: 73 U/L (ref 39–117)
Potassium: 4.8 mEq/L (ref 3.5–5.1)
Sodium: 143 mEq/L (ref 135–145)
Total Bilirubin: 0.4 mg/dL (ref 0.3–1.2)
Total Protein: 7.3 g/dL (ref 6.0–8.3)

## 2011-10-31 NOTE — Progress Notes (Signed)
Addended by: Alvina Chou on: 10/31/2011 10:47 AM   Modules accepted: Orders

## 2011-11-07 ENCOUNTER — Ambulatory Visit (INDEPENDENT_AMBULATORY_CARE_PROVIDER_SITE_OTHER): Payer: BC Managed Care – PPO | Admitting: Family Medicine

## 2011-11-07 ENCOUNTER — Encounter: Payer: Self-pay | Admitting: Family Medicine

## 2011-11-07 VITALS — BP 126/80 | HR 88 | Temp 98.3°F | Ht 61.0 in | Wt 171.2 lb

## 2011-11-07 DIAGNOSIS — E785 Hyperlipidemia, unspecified: Secondary | ICD-10-CM

## 2011-11-07 DIAGNOSIS — E079 Disorder of thyroid, unspecified: Secondary | ICD-10-CM

## 2011-11-07 DIAGNOSIS — E119 Type 2 diabetes mellitus without complications: Secondary | ICD-10-CM

## 2011-11-07 NOTE — Patient Instructions (Signed)
Get back on your diabetes medicines  If you are interested in a shingles/zoster vaccine - call your insurance to check on coverage,( you should not get it within 1 month of other vaccines) , then call us for a prescription  for it to take to a pharmacy that gives the shot  Eat a diabetic diet  Please try to start getting exercise 5 days per week- work up to it  Schedule fasting labs in 3 months and then follow up

## 2011-11-07 NOTE — Assessment & Plan Note (Signed)
Feeling better on current dose Lab 3 mo and f/u

## 2011-11-07 NOTE — Assessment & Plan Note (Signed)
Pt in total denial now about being DM Long disc about this  a1c upto 9  Will start back on her metformin and her lantus and SSI Rev in detail Also DM diet and exercise >25 min spent with face to face with patient, >50% counseling and/or coordinating care   F/u after lab in 3 mo

## 2011-11-07 NOTE — Progress Notes (Signed)
Subjective:    Patient ID: Haley Young, female    DOB: 01/16/1951, 61 y.o.   MRN: [id number]  HPI Here for f/u of chronic conditions  Is doing well overall    Since last visit- heart valve issues Found out her heart valve is leaking - not time to fix it yet -- watching it carefully  Did not end up having infection of the valve  02 is normal  Once in a while a cough- but that is it   bp is very good    Wt is up 3 lb with bmi of 32  Diabetes- off all her diabetes medicines  Home sugar results - not checking , in denial  DM diet --not following DM diet -has done dm ed  Exercise -none  Symptoms-none  A1C last 9 (up from 7) Renal protection-none now  Last eye exam - over a year- needs to make that  Chol- zocor and diet  Lab Results  Component Value Date   CHOL 229* 10/31/2011   CHOL 184 10/27/2008   CHOL 244* 08/13/2008   Lab Results  Component Value Date   HDL 45.90 10/31/2011   HDL 08.65 10/27/2008   HDL 78.4 08/13/2008   Lab Results  Component Value Date   LDLCALC 110* 10/27/2008   Lab Results  Component Value Date   TRIG 132.0 10/31/2011   TRIG 141.0 10/27/2008   TRIG 104 08/13/2008   Lab Results  Component Value Date   CHOLHDL 5 10/31/2011   CHOLHDL 4 10/27/2008   CHOLHDL 5.7 CALC 08/13/2008   Lab Results  Component Value Date   LDLDIRECT 171.3 10/31/2011   LDLDIRECT 188.3 08/13/2008   did go to the beach this weekend -  Ate a lot of shrimp  Chol high   Lab Results  Component Value Date   TSH 7.23* 08/24/2011   still taking her thyroid med - no clinical changes  Patient Active Problem List  Diagnoses  . THYROID STIMULATING HORMONE, ABNORMAL  . DIABETES-TYPE 2  . UNSPECIFIED VITAMIN D DEFICIENCY  . HYPERLIPIDEMIA  . DEPRESSION  . PULMONARY VALVE INSUFFICIENCY  . GERD  . OSTEOPOROSIS  . FATIGUE  . HYPERHIDROSIS  . URINARY INCONTINENCE  . COLONIC POLYPS, HX OF  . Stress reaction, emotional  . Acute bronchitis   Past Medical History  Diagnosis Date  .  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 GERD (gastroesophageal reflux disease)   . Depression   . Urinary incontinence   . Hx of colonic polyps   . Osteoporosis   . Diabetes mellitus   . Lipoma     R axilla   . Hyperhidrosis     especially with hot flashes    . Frequent UTI   . Pyelocystitis     as a child    Past Surgical History  Procedure Date  . Partial hysterectomy 1970  . Cardiac surgery 1955, 2005  . Valve replacement   . Pulmonary vein stenosis repair    History  Substance Use Topics  . Smoking status: Never Smoker   . Smokeless tobacco: Never Used  . Alcohol Use: Yes     occasional wine    Family History  Problem Relation Age of Onset  . Diabetes Mother   . COPD Mother   . Obesity Mother   . Depression Mother   . Alcohol abuse Father   . Cirrhosis Father   . Diabetes Sister   . Down  syndrome Brother   . Down syndrome Daughter   . Lupus Daughter   . Ulcerative colitis Daughter   . Coarctation of the aorta Sister    Allergies  Allergen Reactions  . Oxycodone Hcl     REACTION: hallucinations  . Oxycodone Hcl Er Other (See Comments)    halucinations   Current Outpatient Prescriptions on File Prior to Visit  Medication Sig Dispense Refill  . albuterol (PROAIR HFA) 108 (90 BASE) MCG/ACT inhaler Inhale 1-2 puffs into the lungs every 4 (four) hours as needed.        . ALPRAZolam (XANAX) 0.25 MG tablet Take 1 tablet (0.25 mg total) by mouth daily as needed for sleep or anxiety.  30 tablet  0  . aspirin 81 MG tablet Take 81 mg by mouth daily.        . Cholecalciferol 2000 UNITS TABS Take 2 tablets by mouth once a day       . levothyroxine (SYNTHROID) 75 MCG tablet Take 1 tablet (75 mcg total) by mouth daily.  30 tablet  11  . simvastatin (ZOCOR) 20 MG tablet Take 1 tablet (20 mg total) by mouth at bedtime. 1 by mouth once daily in evening for cholesterol  30 tablet  11  . FLUoxetine (PROZAC) 40 MG capsule Take 40 mg by mouth daily.         Marland Kitchen LANTUS SOLOSTAR 100 UNIT/ML injection 12 Units Daily. unit      . metFORMIN (GLUCOPHAGE) 1000 MG tablet TAKE 1 TABLET BY MOUTH TWICE A DAY WITH MEALS  60 tablet  6  . NOVOLOG FLEXPEN 100 UNIT/ML injection as directed.                 Review of Systems Review of Systems  Constitutional: Negative for fever, appetite change,  and unexpected weight change. pos for intermittent fatigue  Eyes: Negative for pain and visual disturbance.  Respiratory: Negative for cough and shortness of breath.   Cardiovascular: Negative for cp or palpitations   , pos for some mild sob on exertion, neg for edema  Gastrointestinal: Negative for nausea, diarrhea and constipation.  Genitourinary: Negative for urgency and frequency.  Skin: Negative for pallor or rash   Neurological: Negative for weakness, light-headedness, numbness and headaches.  Hematological: Negative for adenopathy. Does not bruise/bleed easily.  Psychiatric/Behavioral: Negative for dysphoric mood. The patient is not nervous/anxious.          Objective:   Physical Exam  Constitutional: She appears well-developed and well-nourished. No distress.       Obese and well appearing   HENT:  Head: Normocephalic and atraumatic.  Mouth/Throat: Oropharynx is clear and moist. No oropharyngeal exudate.  Eyes: Conjunctivae and EOM are normal. Pupils are equal, round, and reactive to light. Right eye exhibits no discharge. Left eye exhibits no discharge.  Neck: Normal range of motion. Neck supple. No JVD present. Carotid bruit is not present. No thyromegaly present.  Cardiovascular: Normal rate and regular rhythm.  Exam reveals no gallop.   Murmur heard. Pulmonary/Chest: Effort normal and breath sounds normal. No respiratory distress. She has no wheezes. She has no rales. She exhibits no tenderness.       No crackles   Abdominal: Soft. Bowel sounds are normal. She exhibits no distension and no abdominal bruit. There is no tenderness.    Musculoskeletal: She exhibits no edema and no tenderness.  Lymphadenopathy:    She has no cervical adenopathy.  Neurological: She is alert. She has normal reflexes.  No cranial nerve deficit. She exhibits normal muscle tone. Coordination normal.  Skin: Skin is warm and dry. No rash noted. No erythema. No pallor.  Psychiatric: She has a normal mood and affect.          Assessment & Plan:

## 2011-11-07 NOTE — Assessment & Plan Note (Signed)
Chol still high even on zocor - ? If compliant Diet is not good  Disc goals for lipids and reasons to control them Rev labs with pt Rev low sat fat diet in detail  Will check again 3 mo - ? Adj statin if not imp

## 2011-12-06 ENCOUNTER — Telehealth: Payer: Self-pay | Admitting: Family Medicine

## 2011-12-06 DIAGNOSIS — Z86010 Personal history of colonic polyps: Secondary | ICD-10-CM

## 2011-12-06 DIAGNOSIS — Z1231 Encounter for screening mammogram for malignant neoplasm of breast: Secondary | ICD-10-CM | POA: Insufficient documentation

## 2011-12-06 NOTE — Telephone Encounter (Signed)
I assume colonosc is for hx of polyps- will refer  ? Where she gets her mammograms ... Will order external and then you can find out where she wants to go Thanks

## 2011-12-06 NOTE — Telephone Encounter (Signed)
Pt is wanting a referral for a mammo and a colonoscopy.

## 2012-03-29 ENCOUNTER — Encounter: Payer: Self-pay | Admitting: Internal Medicine

## 2012-03-29 ENCOUNTER — Ambulatory Visit (INDEPENDENT_AMBULATORY_CARE_PROVIDER_SITE_OTHER): Payer: BC Managed Care – PPO | Admitting: Internal Medicine

## 2012-03-29 VITALS — BP 110/70 | HR 83 | Temp 98.6°F | Ht 61.0 in | Wt 165.2 lb

## 2012-03-29 DIAGNOSIS — E039 Hypothyroidism, unspecified: Secondary | ICD-10-CM

## 2012-03-29 DIAGNOSIS — Z1211 Encounter for screening for malignant neoplasm of colon: Secondary | ICD-10-CM

## 2012-03-29 DIAGNOSIS — F411 Generalized anxiety disorder: Secondary | ICD-10-CM

## 2012-03-29 DIAGNOSIS — E785 Hyperlipidemia, unspecified: Secondary | ICD-10-CM

## 2012-03-29 DIAGNOSIS — F419 Anxiety disorder, unspecified: Secondary | ICD-10-CM | POA: Insufficient documentation

## 2012-03-29 DIAGNOSIS — Z1239 Encounter for other screening for malignant neoplasm of breast: Secondary | ICD-10-CM | POA: Insufficient documentation

## 2012-03-29 DIAGNOSIS — E119 Type 2 diabetes mellitus without complications: Secondary | ICD-10-CM

## 2012-03-29 LAB — COMPREHENSIVE METABOLIC PANEL
Alkaline Phosphatase: 75 U/L (ref 39–117)
Glucose, Bld: 214 mg/dL — ABNORMAL HIGH (ref 70–99)
Sodium: 132 mEq/L — ABNORMAL LOW (ref 135–145)
Total Bilirubin: 0.6 mg/dL (ref 0.3–1.2)
Total Protein: 7.2 g/dL (ref 6.0–8.3)

## 2012-03-29 LAB — LIPID PANEL
Cholesterol: 192 mg/dL (ref 0–200)
LDL Cholesterol: 131 mg/dL — ABNORMAL HIGH (ref 0–99)
Triglycerides: 105 mg/dL (ref 0.0–149.0)
VLDL: 21 mg/dL (ref 0.0–40.0)

## 2012-03-29 MED ORDER — ALPRAZOLAM 0.25 MG PO TABS: 0.2500 mg | ORAL_TABLET | Freq: Three times a day (TID) | ORAL | Status: DC | PRN

## 2012-03-29 NOTE — Assessment & Plan Note (Signed)
Pt has not been checking BG. Last A1c 9%. Will recheck A1c with labs today. Encouraged better compliance with checking BG. Continue metformin. If A1c still elevated, would favor adding Glipizide.

## 2012-03-29 NOTE — Progress Notes (Signed)
Subjective:    Patient ID: Haley Young, female    DOB: December 27, 1950, 61 y.o.   MRN: [id number]  HPI  61 year old female with history of diabetes, hyperlipidemia, hypothyroidism presents to establish care. She was previously seen at one of our other offices. Her primary concern today is worsening of anxiety. She reports that she has significant anxiety related to caring for her 74 year old son who has Asperger's syndrome. She reports that occasionally he is threatening and violent. He currently lives in a group home but is being discharged from the group home because of this. She is working on finding a new place for him to live. She reports that she has been extremely anxious about this and has had difficulty sleeping. In the past, she used a low dose of alprazolam with some improvement in her symptoms. She also takes Prozac with some improvement in her symptoms. Next  In regards to her diabetes, she reports that she has not been checking her blood sugars. She does report compliance with her medications. Notably, her last hemoglobin A1c was 9%. This was in April 2013. This has not been rechecked.   Outpatient Encounter Prescriptions as of 03/29/2012  Medication Sig Dispense Refill  . albuterol (PROAIR HFA) 108 (90 BASE) MCG/ACT inhaler Inhale 1-2 puffs into the lungs every 4 (four) hours as needed.        . ALPRAZolam (XANAX) 0.25 MG tablet Take 1 tablet (0.25 mg total) by mouth 3 (three) times daily as needed for sleep or anxiety.  90 tablet  3  . aspirin 81 MG tablet Take 81 mg by mouth daily.        . Cholecalciferol 2000 UNITS TABS Take 2 tablets by mouth once a day       . FLUoxetine (PROZAC) 40 MG capsule Take 40 mg by mouth daily.        Marland Kitchen LANTUS SOLOSTAR 100 UNIT/ML injection 12 Units Daily. unit      . levothyroxine (SYNTHROID) 75 MCG tablet Take 1 tablet (75 mcg total) by mouth daily.  30 tablet  11  . metFORMIN (GLUCOPHAGE) 1000 MG tablet TAKE 1 TABLET BY MOUTH TWICE A DAY WITH MEALS  60  tablet  6  . NOVOLOG FLEXPEN 100 UNIT/ML injection as directed.      . simvastatin (ZOCOR) 20 MG tablet Take 1 tablet (20 mg total) by mouth at bedtime. 1 by mouth once daily in evening for cholesterol  30 tablet  11  . DISCONTD: ALPRAZolam (XANAX) 0.25 MG tablet Take 1 tablet (0.25 mg total) by mouth daily as needed for sleep or anxiety.  30 tablet  0   BP 110/70  Pulse 83  Temp 98.6 F (37 C) (Oral)  Ht 5\' 1"  (1.549 m)  Wt 165 lb 4 oz (74.957 kg)  BMI 31.22 kg/m2  SpO2 98%  Review of Systems  Constitutional: Negative for fever, chills, appetite change, fatigue and unexpected weight change.  HENT: Negative for ear pain, congestion, sore throat, trouble swallowing, neck pain, voice change and sinus pressure.   Eyes: Negative for visual disturbance.  Respiratory: Negative for cough, shortness of breath, wheezing and stridor.   Cardiovascular: Negative for chest pain, palpitations and leg swelling.  Gastrointestinal: Positive for diarrhea (occasional). Negative for nausea, vomiting, abdominal pain, constipation, blood in stool, abdominal distention and anal bleeding.  Genitourinary: Negative for dysuria and flank pain.  Musculoskeletal: Negative for myalgias, arthralgias and gait problem.  Skin: Negative for color change and rash.  Neurological: Negative for  dizziness and headaches.  Hematological: Negative for adenopathy. Does not bruise/bleed easily.  Psychiatric/Behavioral: Positive for disturbed wake/sleep cycle. Negative for suicidal ideas and dysphoric mood. The patient is nervous/anxious.        Objective:   Physical Exam  Constitutional: She is oriented to person, place, and time. She appears well-developed and well-nourished. No distress.  HENT:  Head: Normocephalic and atraumatic.  Right Ear: External ear normal.  Left Ear: External ear normal.  Nose: Nose normal.  Mouth/Throat: Oropharynx is clear and moist. No oropharyngeal exudate.  Eyes: Conjunctivae normal are  normal. Pupils are equal, round, and reactive to light. Right eye exhibits no discharge. Left eye exhibits no discharge. No scleral icterus.  Neck: Normal range of motion. Neck supple. No tracheal deviation present. No thyromegaly present.  Cardiovascular: Normal rate, regular rhythm, normal heart sounds and intact distal pulses.  Exam reveals no gallop and no friction rub.   No murmur heard. Pulmonary/Chest: Effort normal and breath sounds normal. No respiratory distress. She has no wheezes. She has no rales. She exhibits no tenderness.  Abdominal: Soft. Bowel sounds are normal. She exhibits no distension and no mass. There is no tenderness. There is no guarding.  Musculoskeletal: Normal range of motion. She exhibits no edema and no tenderness.  Lymphadenopathy:    She has no cervical adenopathy.  Neurological: She is alert and oriented to person, place, and time. No cranial nerve deficit. She exhibits normal muscle tone. Coordination normal.  Skin: Skin is warm and dry. No rash noted. She is not diaphoretic. No erythema. No pallor.  Psychiatric: Her speech is normal and behavior is normal. Judgment and thought content normal. Her mood appears anxious.          Assessment & Plan:

## 2012-03-29 NOTE — Assessment & Plan Note (Signed)
Will check lipids and LFTs with labs today. Goal LDL<70. Follow up 3 months.

## 2012-03-29 NOTE — Assessment & Plan Note (Signed)
Will set up follow up colonoscopy.

## 2012-03-29 NOTE — Assessment & Plan Note (Signed)
Will check TSH with labs today. Continue Synthroid. Follow up 3 months. 

## 2012-03-29 NOTE — Assessment & Plan Note (Signed)
Significant anxiety related to caring for son with Asperger's. Will continue alprazolam as needed. Discussed option of counseling, but pt would like to hold off for now.Offered support today. Follow up 3 months and prn.

## 2012-03-29 NOTE — Assessment & Plan Note (Signed)
Mammogram ordered today 

## 2012-04-13 ENCOUNTER — Ambulatory Visit: Payer: Self-pay | Admitting: Internal Medicine

## 2012-04-19 ENCOUNTER — Telehealth: Payer: Self-pay | Admitting: Internal Medicine

## 2012-04-19 ENCOUNTER — Encounter: Payer: Self-pay | Admitting: Internal Medicine

## 2012-04-19 ENCOUNTER — Ambulatory Visit (INDEPENDENT_AMBULATORY_CARE_PROVIDER_SITE_OTHER): Payer: BC Managed Care – PPO | Admitting: Internal Medicine

## 2012-04-19 VITALS — BP 112/74 | HR 94 | Temp 98.3°F | Ht 61.0 in | Wt 167.0 lb

## 2012-04-19 DIAGNOSIS — J019 Acute sinusitis, unspecified: Secondary | ICD-10-CM

## 2012-04-19 MED ORDER — CEFDINIR 300 MG PO CAPS: 300.0000 mg | ORAL_CAPSULE | Freq: Two times a day (BID) | ORAL | Status: DC

## 2012-04-19 MED ORDER — FLUTICASONE PROPIONATE 50 MCG/ACT NA SUSP: 2.0000 | Freq: Every day | NASAL | Status: DC

## 2012-04-19 NOTE — Telephone Encounter (Signed)
Pt wanted to see if she could be seen today Sinus infection When blowing her nose the color is dark yellow and now she is coughing up phlep and is the dark also

## 2012-04-19 NOTE — Assessment & Plan Note (Signed)
Symptoms and exam consistant with a sinus infection.  Treat with Omnicef 300mg  2/day x 10 days.  Flonase nasal spray as directed daily.  Saline nasal spray - flush nose at least 2-3x/day.  Mucinex DM in the am and Robitussin DM in the evening.  Rest.  Fluids.  Explained to her if symptoms changed, worsened or did not resolve - she was to be reevaluated.

## 2012-04-19 NOTE — Patient Instructions (Signed)
It was nice seeing you today.  I am sorry you are not feeling well.  I am going to give you an antibiotic (omnicef) to take 2x/day.  I am also going to prescribe Flonase nasal spray to take as directed.  Flush nose with saline at least 2-3x/day.  Take Mucinex DM one tablet in the am and Robitussin DM in the evening.  Let us know if you do not improve or if symptoms do not resolve.

## 2012-04-19 NOTE — Telephone Encounter (Signed)
Pt will come in today as work in

## 2012-04-19 NOTE — Progress Notes (Signed)
  Subjective:    Patient ID: Haley Young, female    DOB: 1950-12-06, 61 y.o.   MRN: [id number]  HPI 61 year old female with past history of diabetes, valve replacement and depression who comes in today as a workin with concerns regarding a possible sinus infection.  She has been under a lot of stress recently.  She reports that starting several days ago - she developed a sore throat.  Throat is better now, but symptoms have progressed.  She is having increased sinus pressure and nasal congestion (productive of yellow mucus).  Increased post nasal drainage.  No chest congestion or chest tightness.  No wheezing.  Has taken Mucinex.   Past Medical History  Diagnosis Date  . 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 GERD (gastroesophageal reflux disease)   . Depression   . Urinary incontinence   . Hx of colonic polyps   . Osteoporosis   . Diabetes mellitus   . Lipoma     R axilla   . Hyperhidrosis     especially with hot flashes    . Frequent UTI   . Pyelocystitis     as a child   . MRSA cellulitis     surgical wound infection  . Asthma   . Chicken pox   . Heart disease   . Heart murmur   . Hyperlipidemia   . Thyroid disease     Review of Systems Patient denies any headache, lightheadedness or dizziness.  No chest pain, tightness or palpatations. No increased shortness of breath or wheezing.  She does report some increased cough, but feels this is secondary to the drainage.  No nausea or vomiting.  No abdominal pain or cramping.  Tolerating po's.   No bowel change.          Objective:   Physical Exam Filed Vitals:   04/19/12 1545  BP: 112/74  Pulse: 94  Temp: 98.3 F (45.21 C)   61 year old female in no acute distress.   HEENT:  Nares - clear except slightly erythematous turbinates.  TMs without erythema.  OP- without lesions or erythema.  Sinus tenderness to palpation.  NECK:  Supple, nontender.   HEART:  Appears to be regular.  II/VI systolic murmur.   (pt aware)  LUNGS:  Without crackles or wheezing audible.  Respirations even and unlabored.  Good breath sounds bilaterally.                Assessment & Plan:

## 2012-04-19 NOTE — Telephone Encounter (Signed)
You can have her come on in and I will work her in between patients.  Thanks.

## 2012-04-23 ENCOUNTER — Encounter: Payer: Self-pay | Admitting: Internal Medicine

## 2012-05-09 ENCOUNTER — Telehealth: Payer: Self-pay | Admitting: Internal Medicine

## 2012-05-09 NOTE — Telephone Encounter (Signed)
Pt called she takes 2 xanax to help her sleep at night.  Pt states she would like to start taking prozac again if possbile Her mom is not doing well in nursing home and she is having have problems with her son

## 2012-05-09 NOTE — Telephone Encounter (Signed)
Needs visit to discuss

## 2012-05-10 NOTE — Telephone Encounter (Signed)
Does she have alprazolam?  We can restart Prozac 20mg  daily #30 with 1 refill.

## 2012-05-10 NOTE — Telephone Encounter (Signed)
Pt wanted to know if you could call her something in until Monday.  Having problems at home with mom in nursing home not doing good and her son having problem at home he is [facility] at [facility]  Pt has Monday appointment

## 2012-05-11 NOTE — Telephone Encounter (Signed)
Spoke with patient and she is out of town, she will plan on seeing [place] on Monday at her appt.

## 2012-05-14 ENCOUNTER — Telehealth: Payer: Self-pay | Admitting: Internal Medicine

## 2012-05-14 ENCOUNTER — Ambulatory Visit: Payer: BC Managed Care – PPO | Admitting: Internal Medicine

## 2012-05-14 MED ORDER — FLUOXETINE HCL 40 MG PO CAPS: 40.0000 mg | ORAL_CAPSULE | Freq: Every day | ORAL | Status: DC

## 2012-05-14 NOTE — Telephone Encounter (Signed)
Pt is needing Prozac. [facility] uses [facility] on [address]

## 2012-05-14 NOTE — Telephone Encounter (Signed)
Spoke with patient via telephone and she stated that a lot has been going on with her mom.  Her mom was taken to [facility] ED via ambulance on Saturday with severe back pain.  Her mom has pancreatic cancer and it may have spread to her back and spine.  She has been dealing with a lot lately.  Rx for Prozac sent to pharmacy #30 with no refills until she comes in for an appt.  Patient stated that she will call and schedule an appt when things get better.

## 2012-05-14 NOTE — Telephone Encounter (Signed)
We had called in Prozac with the understanding that she would keep visit. Please let her know that she needs to be seen. (canceled visit today)

## 2012-05-23 ENCOUNTER — Other Ambulatory Visit: Payer: Self-pay | Admitting: Internal Medicine

## 2012-05-23 NOTE — Telephone Encounter (Signed)
Pt is needing refill on all her meds that she takes on a regular basis. She will be using [facility] [phone] She is completely out of her Levothyroxine and Symbatatin. She was wondering if those could be sent in to a local pharmacy.

## 2012-05-25 MED ORDER — LEVOTHYROXINE SODIUM 100 MCG PO TABS: 100.0000 ug | ORAL_TABLET | Freq: Every day | ORAL | Status: DC

## 2012-05-25 MED ORDER — SIMVASTATIN 20 MG PO TABS: 20.0000 mg | ORAL_TABLET | Freq: Every day | ORAL | Status: DC

## 2012-05-25 NOTE — Telephone Encounter (Signed)
[facility] for [facility] day supply, and Rx's for 90 days printed for patient to pick up.  Patient advised via telephone.

## 2012-06-19 ENCOUNTER — Other Ambulatory Visit: Payer: Self-pay | Admitting: General Practice

## 2012-06-19 DIAGNOSIS — F419 Anxiety disorder, unspecified: Secondary | ICD-10-CM

## 2012-06-19 NOTE — Telephone Encounter (Signed)
Pt is going to need these refilled and mailed to [facility]. Please call them at [phone] to verify how Pt has to do mail order. Please fill for 90 days if possible.

## 2012-06-22 ENCOUNTER — Ambulatory Visit: Payer: Self-pay | Admitting: Gastroenterology

## 2012-06-25 LAB — PATHOLOGY REPORT

## 2012-06-28 ENCOUNTER — Other Ambulatory Visit: Payer: Self-pay | Admitting: General Practice

## 2012-06-28 ENCOUNTER — Ambulatory Visit: Payer: BC Managed Care – PPO

## 2012-06-28 ENCOUNTER — Encounter: Payer: Self-pay | Admitting: Internal Medicine

## 2012-06-28 ENCOUNTER — Ambulatory Visit (INDEPENDENT_AMBULATORY_CARE_PROVIDER_SITE_OTHER): Payer: BC Managed Care – PPO | Admitting: Internal Medicine

## 2012-06-28 VITALS — BP 116/72 | HR 77 | Temp 97.6°F | Resp 16 | Wt 161.5 lb

## 2012-06-28 DIAGNOSIS — F419 Anxiety disorder, unspecified: Secondary | ICD-10-CM

## 2012-06-28 DIAGNOSIS — E119 Type 2 diabetes mellitus without complications: Secondary | ICD-10-CM

## 2012-06-28 DIAGNOSIS — E785 Hyperlipidemia, unspecified: Secondary | ICD-10-CM

## 2012-06-28 DIAGNOSIS — Z23 Encounter for immunization: Secondary | ICD-10-CM

## 2012-06-28 DIAGNOSIS — N390 Urinary tract infection, site not specified: Secondary | ICD-10-CM

## 2012-06-28 DIAGNOSIS — F411 Generalized anxiety disorder: Secondary | ICD-10-CM

## 2012-06-28 LAB — LIPID PANEL
HDL: 42.2 mg/dL (ref >39.00)
LDL Cholesterol: 127 mg/dL — ABNORMAL HIGH (ref 0–99)
Total CHOL/HDL Ratio: 5
Triglycerides: 108 mg/dL (ref 0.0–149.0)

## 2012-06-28 LAB — MICROALBUMIN / CREATININE URINE RATIO
Microalb Creat Ratio: 1.2 mg/g (ref 0.0–30.0)
Microalb, Ur: 1.5 mg/dL (ref 0.0–1.9)

## 2012-06-28 LAB — COMPREHENSIVE METABOLIC PANEL
ALT: 26 U/L (ref 0–35)
AST: 32 U/L (ref 0–37)
Chloride: 97 mEq/L (ref 96–112)
Creatinine, Ser: 0.8 mg/dL (ref 0.4–1.2)
Sodium: 135 mEq/L (ref 135–145)
Total Bilirubin: 0.6 mg/dL (ref 0.3–1.2)

## 2012-06-28 LAB — POCT URINALYSIS DIPSTICK
Blood, UA: NEGATIVE
Nitrite, UA: NEGATIVE
Protein, UA: NEGATIVE
Urobilinogen, UA: 0.2
pH, UA: 5

## 2012-06-28 LAB — TSH: TSH: 4.82 u[IU]/mL (ref 0.35–5.50)

## 2012-06-28 MED ORDER — FLUOXETINE HCL 40 MG PO CAPS: 40.0000 mg | ORAL_CAPSULE | Freq: Every day | ORAL | Status: DC

## 2012-06-28 MED ORDER — LEVOTHYROXINE SODIUM 100 MCG PO TABS: 100.0000 ug | ORAL_TABLET | Freq: Every day | ORAL | Status: DC

## 2012-06-28 MED ORDER — SIMVASTATIN 20 MG PO TABS: 20.0000 mg | ORAL_TABLET | Freq: Every day | ORAL | Status: DC

## 2012-06-28 MED ORDER — ALPRAZOLAM 0.25 MG PO TABS: 0.2500 mg | ORAL_TABLET | Freq: Three times a day (TID) | ORAL | Status: DC | PRN

## 2012-06-28 MED ORDER — METFORMIN HCL 1000 MG PO TABS: 1000.0000 mg | ORAL_TABLET | Freq: Two times a day (BID) | ORAL | Status: DC

## 2012-06-28 NOTE — Assessment & Plan Note (Signed)
Will recheck lipids and LFTs with labs today. Last LDL is elevated above goal of less than 70. If LDL continues to be elevated, will change to atorvastatin.

## 2012-06-28 NOTE — Assessment & Plan Note (Signed)
Symptoms of anxiety recently worsening after passing of patient's mother and ongoing issues with her son. Offered support today. Recommended continued counseling through hospice. She prefers not to make medication changes at this time. If symptoms are persisting, she will return to clinic or call.

## 2012-06-28 NOTE — Progress Notes (Signed)
Subjective:    Patient ID: Haley Young, female    DOB: 01/07/1951, 61 y.o.   MRN: [id number]  HPI 61 year old female with history of diabetes, depression/anxiety presents for followup. She reports that it has been a difficult time for her. She recently lost her mother to pancreatic cancer. She has also been taking care of her son who has Autism and he was discharged from his group home for violent behavior. She is having difficulty managing his symptoms. She describes some ongoing anxiety and depression in herself. She has been undergoing counseling with hospice. In regards to diabetes, she has not been checking her blood sugars. She does report compliance with her medications.  Outpatient Encounter Prescriptions as of 06/28/2012  Medication Sig Dispense Refill  . aspirin 81 MG tablet Take 81 mg by mouth daily.        . Cholecalciferol 2000 UNITS TABS Take 2 tablets by mouth once a day       . albuterol (PROAIR HFA) 108 (90 BASE) MCG/ACT inhaler Inhale 1-2 puffs into the lungs every 4 (four) hours as needed.        . cefdinir (OMNICEF) 300 MG capsule Take 1 capsule (300 mg total) by mouth 2 (two) times daily.  20 capsule  0  . fluticasone (FLONASE) 50 MCG/ACT nasal spray Place 2 sprays into the nose daily.  16 g  0  . LANTUS SOLOSTAR 100 UNIT/ML injection 12 Units Daily. unit      . NOVOLOG FLEXPEN 100 UNIT/ML injection as directed.       BP 116/72  Pulse 77  Temp 97.6 F (36.4 C) (Oral)  Resp 16  Wt 161 lb 8 oz (73.256 kg)  SpO2 90%  Review of Systems  Constitutional: Negative for fever, chills, appetite change, fatigue and unexpected weight change.  HENT: Negative for ear pain, congestion, sore throat, trouble swallowing, neck pain, voice change and sinus pressure.   Eyes: Negative for visual disturbance.  Respiratory: Negative for cough, shortness of breath, wheezing and stridor.   Cardiovascular: Negative for chest pain, palpitations and leg swelling.  Gastrointestinal: Negative for  nausea, vomiting, abdominal pain, diarrhea, constipation, blood in stool, abdominal distention and anal bleeding.  Genitourinary: Negative for dysuria and flank pain.  Musculoskeletal: Negative for myalgias, arthralgias and gait problem.  Skin: Negative for color change and rash.  Neurological: Negative for dizziness and headaches.  Hematological: Negative for adenopathy. Does not bruise/bleed easily.  Psychiatric/Behavioral: Positive for dysphoric mood. Negative for suicidal ideas and sleep disturbance. The patient is nervous/anxious.        Objective:   Physical Exam  Constitutional: She is oriented to person, place, and time. She appears well-developed and well-nourished. No distress.  HENT:  Head: Normocephalic and atraumatic.  Right Ear: External ear normal.  Left Ear: External ear normal.  Nose: Nose normal.  Mouth/Throat: Oropharynx is clear and moist. No oropharyngeal exudate.  Eyes: Conjunctivae normal are normal. Pupils are equal, round, and reactive to light. Right eye exhibits no discharge. Left eye exhibits no discharge. No scleral icterus.  Neck: Normal range of motion. Neck supple. No tracheal deviation present. No thyromegaly present.  Cardiovascular: Normal rate, regular rhythm and intact distal pulses.  Exam reveals no gallop and no friction rub.   Murmur heard. Pulmonary/Chest: Effort normal and breath sounds normal. No respiratory distress. She has no wheezes. She has no rales. She exhibits no tenderness.  Musculoskeletal: Normal range of motion. She exhibits no edema and no tenderness.  Lymphadenopathy:  She has no cervical adenopathy.  Neurological: She is alert and oriented to person, place, and time. No cranial nerve deficit. She exhibits normal muscle tone. Coordination normal.  Skin: Skin is warm and dry. No rash noted. She is not diaphoretic. No erythema. No pallor.  Psychiatric: She has a normal mood and affect. Her behavior is normal. Judgment and thought  content normal.          Assessment & Plan:

## 2012-06-28 NOTE — Telephone Encounter (Signed)
Meds filled per pt at visit to new mail order pharmacy.

## 2012-06-28 NOTE — Assessment & Plan Note (Signed)
Patient has not been checking blood glucose. Will check A1c with labs today. Encouraged better compliance with checking blood sugars. We'll continue metformin and Lantus. Followup in 3 months.

## 2012-06-29 MED ORDER — ATORVASTATIN CALCIUM 20 MG PO TABS: 20.0000 mg | ORAL_TABLET | Freq: Every day | ORAL | Status: DC

## 2012-06-29 NOTE — Addendum Note (Signed)
Addended by: Jackson Latino on: 06/29/2012 04:16 PM   Modules accepted: Orders

## 2012-07-09 ENCOUNTER — Other Ambulatory Visit: Payer: Self-pay | Admitting: General Practice

## 2012-07-09 ENCOUNTER — Telehealth: Payer: Self-pay | Admitting: General Practice

## 2012-07-09 DIAGNOSIS — F419 Anxiety disorder, unspecified: Secondary | ICD-10-CM

## 2012-07-09 MED ORDER — ALPRAZOLAM 0.25 MG PO TABS: 0.2500 mg | ORAL_TABLET | Freq: Three times a day (TID) | ORAL | Status: DC | PRN

## 2012-07-09 MED ORDER — SIMVASTATIN 20 MG PO TABS: 20.0000 mg | ORAL_TABLET | Freq: Every day | ORAL | Status: DC

## 2012-07-09 MED ORDER — LEVOTHYROXINE SODIUM 100 MCG PO TABS: 100.0000 ug | ORAL_TABLET | Freq: Every day | ORAL | Status: DC

## 2012-07-09 NOTE — Telephone Encounter (Signed)
Pt called stating that her pharmacy never got her Xanax refill. Provider ok'd ths at her last office visit. Tried to reprint today due to computer errors cannot print out Rx.

## 2012-07-16 ENCOUNTER — Telehealth: Payer: Self-pay | Admitting: *Deleted

## 2012-07-16 NOTE — Telephone Encounter (Signed)
Fax sent to [facility] to clarify quantity and refills.  #[phone] with 6 refills per Dr. Dan Humphreys.

## 2012-07-17 ENCOUNTER — Other Ambulatory Visit: Payer: Self-pay | Admitting: *Deleted

## 2012-07-18 MED ORDER — SIMVASTATIN 20 MG PO TABS: 20.0000 mg | ORAL_TABLET | Freq: Every day | ORAL | Status: DC

## 2012-07-18 MED ORDER — LEVOTHYROXINE SODIUM 100 MCG PO TABS: 100.0000 ug | ORAL_TABLET | Freq: Every day | ORAL | Status: DC

## 2012-07-18 NOTE — Telephone Encounter (Signed)
Actually, we did call in Lipitor for her to take as her cholesterol was elevated on labs.  Per notes, this was communicated to pt?  I wanted her to start this medication and then repeat lipids and LFTs in 1 month.

## 2012-07-18 NOTE — Telephone Encounter (Signed)
To clarify, she was supposed to stop the simvastatin and start lipitor.

## 2012-07-18 NOTE — Telephone Encounter (Signed)
Patient called back to let me know that she needs Rx's for Simvastatin and Levothyroxine.  She stated that somehow Lipitor was put on her med list and refill and she was never on Lipitor.  She paid $20 for this Rx and wants a refund from our office.  She stated that this was a very bad mistake and what would have happened if she had taken this medication?  She wants to know what Dr. Dan Humphreys thinks can be done about this.

## 2012-07-18 NOTE — Telephone Encounter (Signed)
Patient advised as instructed via telephone, she stated the message regarding the change from Simvastatin to Lipitor must have been left on her machine and she was confused.  I called [facility] and they have cancelled the Rx for Simvastatin and patient has the Rx for Lipitor already.  Advised patient that she was suppose to start the Lipitor and recheck labs in one month.

## 2012-08-31 ENCOUNTER — Telehealth: Payer: Self-pay | Admitting: General Practice

## 2012-08-31 NOTE — Telephone Encounter (Signed)
Made in error

## 2012-09-13 LAB — HM COLONOSCOPY

## 2012-09-13 LAB — HM DIABETES EYE EXAM

## 2012-09-26 ENCOUNTER — Ambulatory Visit (INDEPENDENT_AMBULATORY_CARE_PROVIDER_SITE_OTHER): Payer: BC Managed Care – PPO | Admitting: Internal Medicine

## 2012-09-26 DIAGNOSIS — I379 Nonrheumatic pulmonary valve disorder, unspecified: Secondary | ICD-10-CM

## 2012-09-26 DIAGNOSIS — E119 Type 2 diabetes mellitus without complications: Secondary | ICD-10-CM

## 2012-09-26 DIAGNOSIS — F411 Generalized anxiety disorder: Secondary | ICD-10-CM

## 2012-09-26 DIAGNOSIS — E785 Hyperlipidemia, unspecified: Secondary | ICD-10-CM

## 2012-09-26 NOTE — Progress Notes (Signed)
  Subjective:    Patient ID: Haley Young, female    DOB: [birth year], 62 y.o.   MRN: [id number]  HPI No show   Review of Systems     Objective:   Physical Exam        Assessment & Plan:

## 2012-10-29 ENCOUNTER — Telehealth: Payer: Self-pay | Admitting: Internal Medicine

## 2012-10-29 NOTE — Telephone Encounter (Signed)
Patient Information:  Caller Name: Gera  Phone: [phone]  Patient: Haley Young, Haley Young  Gender: Female  DOB: 06-04-1951  Age: 62 Years  PCP: Ronna Polio (Adults only)  Office Follow Up:  Does the office need to follow up with this patient?: No  Instructions For The Office: N/A   Symptoms  Reason For Call & Symptoms: Pt is calling and states that she has a productive cough and she is prone to bronchtitis; sx started 10/23/12;  sx include runny nose, congestion and coughing up yellow mucus; no fever; cough is the main sx;  Reviewed Health History In EMR: Yes  Reviewed Medications In EMR: Yes  Reviewed Allergies In EMR: Yes  Reviewed Surgeries / Procedures: Yes  Date of Onset of Symptoms: 10/23/2012  Guideline(s) Used:  Cough  Disposition Per Guideline:   See Today or Tomorrow in Office  Reason For Disposition Reached:   Patient wants to be seen  Advice Given:  Call Back If:  You become worse.  Patient Will Follow Care Advice:  YES  Appointment Scheduled:  10/30/2012 09:00:00 Appointment Scheduled Provider:  Dale Woodson

## 2012-10-30 ENCOUNTER — Ambulatory Visit (INDEPENDENT_AMBULATORY_CARE_PROVIDER_SITE_OTHER): Payer: BC Managed Care – PPO | Admitting: Internal Medicine

## 2012-10-30 ENCOUNTER — Encounter: Payer: Self-pay | Admitting: Internal Medicine

## 2012-10-30 VITALS — BP 118/70 | HR 78 | Temp 98.7°F | Ht 61.0 in | Wt 160.5 lb

## 2012-10-30 DIAGNOSIS — I379 Nonrheumatic pulmonary valve disorder, unspecified: Secondary | ICD-10-CM

## 2012-10-30 MED ORDER — CEFDINIR 300 MG PO CAPS: 300.0000 mg | ORAL_CAPSULE | Freq: Two times a day (BID) | ORAL | Status: DC

## 2012-10-31 ENCOUNTER — Encounter: Payer: Self-pay | Admitting: Internal Medicine

## 2012-10-31 NOTE — Progress Notes (Signed)
Subjective:    Patient ID: Haley Young, female    DOB: [birth year], 62 y.o.   MRN: [id number]  HPI 62 year old female with past history of pulmonic insufficiency and hypothyroidism who comes in today as a work in with concerns regarding increased sinus pressure and congestion.  States symptoms have been present now for over a week.  Started with sore throat.  Initially some increased sneezing.  Now with increased nasal congestion and cough - productive of yellow mucus.  Increased sinus pressure.  Previously had throbbing in her jaw, but this has resolved.  Ears stopped up.  Some increased drainage.  No chest congestion or tightness.  No increased sob reported.  No fever.  Has been taking mucinex sinus max.     Past Medical History  Diagnosis Date  . 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 GERD (gastroesophageal reflux disease)   . Depression   . Urinary incontinence   . Hx of colonic polyps   . Osteoporosis   . Diabetes mellitus   . Lipoma     R axilla   . Hyperhidrosis     especially with hot flashes    . Frequent UTI   . Pyelocystitis     as a child   . MRSA cellulitis     surgical wound infection  . Asthma   . Chicken pox   . Heart disease   . Heart murmur   . Hyperlipidemia   . Thyroid disease     Current Outpatient Prescriptions on File Prior to Visit  Medication Sig Dispense Refill  . ALPRAZolam (XANAX) 0.25 MG tablet Take 1 tablet (0.25 mg total) by mouth 3 (three) times daily as needed for sleep or anxiety.  90 tablet  3  . aspirin 81 MG tablet Take 81 mg by mouth daily.        Marland Kitchen atorvastatin (LIPITOR) 20 MG tablet Take 20 mg by mouth daily.      Marland Kitchen FLUoxetine (PROZAC) 40 MG capsule Take 1 capsule (40 mg total) by mouth daily.  90 capsule  3  . LANTUS SOLOSTAR 100 UNIT/ML injection 12 Units Daily. unit      . levothyroxine (SYNTHROID, LEVOTHROID) 100 MCG tablet Take 1 tablet (100 mcg total) by mouth daily.  90 tablet  3  . metFORMIN  (GLUCOPHAGE) 1000 MG tablet Take 1 tablet (1,000 mg total) by mouth 2 (two) times daily with a meal.  180 tablet  3  . albuterol (PROAIR HFA) 108 (90 BASE) MCG/ACT inhaler Inhale 1-2 puffs into the lungs every 4 (four) hours as needed.        . fluticasone (FLONASE) 50 MCG/ACT nasal spray Place 2 sprays into the nose daily.  16 g  0  . NOVOLOG FLEXPEN 100 UNIT/ML injection as directed.       No current facility-administered medications on file prior to visit.    Review of Systems Patient denies any headache, lightheadedness or dizziness. She does report the increased sinus pressure and nasal congestion.  Increased drainage and ear fullness.   No chest pain or tightness.  No increased shortness of breath or chest congestion  Does report some cough - productive of yellow mucus.  States is from drainage.   No nausea or vomiting.  No diarrhea.       Objective:   Physical Exam Filed Vitals:   10/30/12 0921  BP: 118/70  Pulse: 78  Temp: 98.7 F (37.1 C)  62 year old female in no acute distress.   HEENT:  Nares- erythematous turbinates.  Oropharynx - without lesions.  TMs visualized without erythema.  Minimal tenderness to palpation over the sinuses.   NECK:  Supple.  Nontender.  HEART:  Appears to be regular.  2/6 systolic murmur.  LUNGS:  No crackles or wheezing audible.  Respirations even and unlabored.  Good breath sounds bilaterally.            Assessment & Plan:  SINUSITIS.  Will treat with omnicef 300mg  bid as directed.  Gave her a sample of nasonex and instructed her on proper technique.  Saline nasal flushes as directed.  mucinex as directed.  Rest. Fluids.  Explained to her if symptoms changed, worsened or did not resolve - she was to be reevaluated.

## 2012-10-31 NOTE — Assessment & Plan Note (Signed)
Planning to follow up with cardiology soon - to have it reevaluated.

## 2012-11-02 ENCOUNTER — Telehealth: Payer: Self-pay | Admitting: *Deleted

## 2012-11-02 NOTE — Telephone Encounter (Signed)
I agree with plan. She has been on antibiotics for 3 days, and it may take more time to clear this infection. Also, if symptoms were related to viral infection, then they may not improve with antibiotics, and may take several weeks to resolve. She should let us know if fever >101F, shortness of breath, chest pain.

## 2012-11-02 NOTE — Telephone Encounter (Signed)
Patient informed and verbally agreed understanding.  

## 2012-11-02 NOTE — Telephone Encounter (Signed)
Pt called to update on symptoms from visit 10/30/12. States symptoms have not improved, but have not worsened either. Continues with productive cough, yellow sputum and nasal congestion. Taking Omnicef as directed, Nasonex and Mucinex. Denies fever, shortness of breath, or wheezing. Advised to call back if symptoms worsen or change. Advised symptoms related to infection may take time to improve and to continue with medications as prescribed by Dr. Lorin Picket. Requests call back with any other advice

## 2012-11-06 ENCOUNTER — Telehealth: Payer: Self-pay | Admitting: *Deleted

## 2012-11-06 DIAGNOSIS — F419 Anxiety disorder, unspecified: Secondary | ICD-10-CM

## 2012-11-06 MED ORDER — ATORVASTATIN CALCIUM 20 MG PO TABS: 20.0000 mg | ORAL_TABLET | Freq: Every day | ORAL | Status: DC

## 2012-11-06 MED ORDER — LEVOTHYROXINE SODIUM 100 MCG PO TABS
100.0000 ug | ORAL_TABLET | Freq: Every day | ORAL | Status: DC
Start: 2012-11-06 — End: 2012-11-22

## 2012-11-06 MED ORDER — ALPRAZOLAM 0.25 MG PO TABS: 0.2500 mg | ORAL_TABLET | Freq: Three times a day (TID) | ORAL | Status: DC | PRN

## 2012-11-06 MED ORDER — FLUOXETINE HCL 40 MG PO CAPS: 40.0000 mg | ORAL_CAPSULE | Freq: Every day | ORAL | Status: DC

## 2012-11-06 NOTE — Telephone Encounter (Signed)
Medication to be signed and faxed to [facility] per patient request.

## 2012-11-22 ENCOUNTER — Telehealth: Payer: Self-pay | Admitting: Internal Medicine

## 2012-11-22 MED ORDER — ATORVASTATIN CALCIUM 20 MG PO TABS: 20.0000 mg | ORAL_TABLET | Freq: Every day | ORAL | Status: DC

## 2012-11-22 MED ORDER — LEVOTHYROXINE SODIUM 100 MCG PO TABS: 100.0000 ug | ORAL_TABLET | Freq: Every day | ORAL | Status: DC

## 2012-11-22 NOTE — Telephone Encounter (Signed)
Pt checking on her rx for liptor or thyroid meds.  Pt stated this has not come by mail order yet Please advise

## 2012-11-22 NOTE — Telephone Encounter (Signed)
Prescription was sent on 4/29 and resent today.

## 2012-11-22 NOTE — Telephone Encounter (Signed)
Patient aware prescriptions sent to pharmacy again today

## 2013-01-01 ENCOUNTER — Other Ambulatory Visit: Payer: Self-pay | Admitting: *Deleted

## 2013-01-01 DIAGNOSIS — E119 Type 2 diabetes mellitus without complications: Secondary | ICD-10-CM

## 2013-01-01 MED ORDER — METFORMIN HCL 1000 MG PO TABS: 1000.0000 mg | ORAL_TABLET | Freq: Two times a day (BID) | ORAL | Status: DC

## 2013-01-25 ENCOUNTER — Telehealth: Payer: Self-pay | Admitting: Internal Medicine

## 2013-01-25 NOTE — Telephone Encounter (Signed)
Spoke with patient, informed both these medication were sent to [facility], the Xanax was sent on 4/29 and the Levothyroxine was sent in May. Patient stated she would look at her medication bottles and call back on Monday.

## 2013-01-25 NOTE — Telephone Encounter (Signed)
States she needs her Xanax and levothyroxine.  States she has tried calling her pharmacy and received a nasty letter from her insurance that she has to go through her PCP to get meds ordered or her insurance will not pay for meds.

## 2013-01-29 NOTE — Telephone Encounter (Signed)
Patient never returned call  

## 2013-03-14 ENCOUNTER — Telehealth: Payer: Self-pay | Admitting: *Deleted

## 2013-03-14 DIAGNOSIS — F419 Anxiety disorder, unspecified: Secondary | ICD-10-CM

## 2013-03-14 NOTE — Telephone Encounter (Signed)
May refill x 1. Needs to come in to pick up Rx and sign contract and give UDS.

## 2013-03-14 NOTE — Telephone Encounter (Signed)
Patient requesting refill on Xanax. 

## 2013-03-15 MED ORDER — ALPRAZOLAM 0.25 MG PO TABS: 0.2500 mg | ORAL_TABLET | Freq: Three times a day (TID) | ORAL | Status: DC | PRN

## 2013-03-15 NOTE — Telephone Encounter (Signed)
Patient informed prescription is ready, must come in to pick this up and bring ID

## 2013-03-29 ENCOUNTER — Encounter: Payer: Self-pay | Admitting: Internal Medicine

## 2013-05-20 ENCOUNTER — Telehealth: Payer: Self-pay | Admitting: Internal Medicine

## 2013-05-20 DIAGNOSIS — F419 Anxiety disorder, unspecified: Secondary | ICD-10-CM

## 2013-05-20 NOTE — Telephone Encounter (Signed)
Needs her xanax called into her mail order pharmacy she states we have this information on file. She did say that she was down to her last two and I asked did it need to be called into a local pharmacy and she said it couldn't be per her insurance company.

## 2013-05-21 MED ORDER — ALPRAZOLAM 0.25 MG PO TABS: 0.2500 mg | ORAL_TABLET | Freq: Three times a day (TID) | ORAL | Status: DC | PRN

## 2013-05-21 NOTE — Telephone Encounter (Signed)
Spoke with patient informed her refill will be sent in and it is time for her to schedule an appointment. Please call office to schedule an appointment before her refill runs out and next time contact her pharmacy for all refill request.

## 2013-05-21 NOTE — Telephone Encounter (Signed)
Prescription printed to be signed and faxed. Per protocol and future reference, patient needs to contact her pharmacy first so they may fax [place] the refill request.

## 2013-07-25 ENCOUNTER — Ambulatory Visit (INDEPENDENT_AMBULATORY_CARE_PROVIDER_SITE_OTHER): Payer: Medicare Other | Admitting: Internal Medicine

## 2013-07-25 ENCOUNTER — Encounter: Payer: Self-pay | Admitting: Internal Medicine

## 2013-07-25 VITALS — BP 128/80 | HR 86 | Temp 97.6°F | Wt 161.0 lb

## 2013-07-25 DIAGNOSIS — E785 Hyperlipidemia, unspecified: Secondary | ICD-10-CM

## 2013-07-25 DIAGNOSIS — Z23 Encounter for immunization: Secondary | ICD-10-CM | POA: Diagnosis not present

## 2013-07-25 DIAGNOSIS — E1169 Type 2 diabetes mellitus with other specified complication: Secondary | ICD-10-CM | POA: Insufficient documentation

## 2013-07-25 DIAGNOSIS — E119 Type 2 diabetes mellitus without complications: Secondary | ICD-10-CM | POA: Diagnosis not present

## 2013-07-25 DIAGNOSIS — F411 Generalized anxiety disorder: Secondary | ICD-10-CM | POA: Diagnosis not present

## 2013-07-25 DIAGNOSIS — Z1239 Encounter for other screening for malignant neoplasm of breast: Secondary | ICD-10-CM

## 2013-07-25 DIAGNOSIS — G4700 Insomnia, unspecified: Secondary | ICD-10-CM

## 2013-07-25 DIAGNOSIS — E039 Hypothyroidism, unspecified: Secondary | ICD-10-CM | POA: Diagnosis not present

## 2013-07-25 MED ORDER — FLUOXETINE HCL 40 MG PO CAPS: 40.0000 mg | ORAL_CAPSULE | Freq: Every day | ORAL | Status: DC

## 2013-07-25 MED ORDER — ATORVASTATIN CALCIUM 20 MG PO TABS: 20.0000 mg | ORAL_TABLET | Freq: Every day | ORAL | Status: DC

## 2013-07-25 MED ORDER — METFORMIN HCL 1000 MG PO TABS: 1000.0000 mg | ORAL_TABLET | Freq: Two times a day (BID) | ORAL | Status: DC

## 2013-07-25 MED ORDER — LEVOTHYROXINE SODIUM 100 MCG PO TABS: 100.0000 ug | ORAL_TABLET | Freq: Every day | ORAL | Status: DC

## 2013-07-25 NOTE — Progress Notes (Signed)
Subjective:    Patient ID: Haley Young, female    DOB: 1951-05-07, 63 y.o.   MRN: [id number]  HPI 63YO female with h/o hyperlipidemia, DM, presents for follow up. She is generally feeling well. She did not bring record of BG today, but notes some recent dietary indiscretion. She is compliant with medications.   Only concern today is insomnia. Taking Alprazolam 0.5mg  at bedtime with improvement in sleep onset, however then wakes around 3am and cannot get back to sleep. She would like to stop medication. She tries to follow good sleep hygiene.  Outpatient Encounter Prescriptions as of 07/25/2013  Medication Sig  . aspirin 81 MG tablet Take 81 mg by mouth daily.    Marland Kitchen atorvastatin (LIPITOR) 20 MG tablet Take 1 tablet (20 mg total) by mouth daily.  . Cholecalciferol (VITAMIN D3) 2000 UNITS TABS Take 2,000 Units by mouth daily.  Marland Kitchen FLUoxetine (PROZAC) 40 MG capsule Take 1 capsule (40 mg total) by mouth daily.  Marland Kitchen levothyroxine (SYNTHROID, LEVOTHROID) 100 MCG tablet Take 1 tablet (100 mcg total) by mouth daily.  . metFORMIN (GLUCOPHAGE) 1000 MG tablet Take 1 tablet (1,000 mg total) by mouth 2 (two) times daily with a meal.  . Omega-3 Fatty Acids (FISH OIL) 1200 MG CAPS Take 1,200 mg by mouth daily.  Marland Kitchen Phenylephrine-APAP-Guaifenesin (MUCINEX SINUS-MAX CONGESTION PO) Take by mouth at bedtime as needed.  Marland Kitchen albuterol (PROAIR HFA) 108 (90 BASE) MCG/ACT inhaler Inhale 1-2 puffs into the lungs every 4 (four) hours as needed.    . fluticasone (FLONASE) 50 MCG/ACT nasal spray Place 2 sprays into the nose daily.  Marland Kitchen LANTUS SOLOSTAR 100 UNIT/ML injection 12 Units Daily. unit  . NOVOLOG FLEXPEN 100 UNIT/ML injection as directed.  . [DISCONTINUED] cefdinir (OMNICEF) 300 MG capsule Take 1 capsule (300 mg total) by mouth 2 (two) times daily.   BP 128/80  Pulse 86  Temp(Src) 97.6 F (36.4 C) (Oral)  Wt 161 lb (73.029 kg)  SpO2 94%  Review of Systems  Constitutional: Negative for fever, chills, appetite  change, fatigue and unexpected weight change.  HENT: Negative for congestion, ear pain, sinus pressure, sore throat, trouble swallowing and voice change.   Eyes: Negative for visual disturbance.  Respiratory: Negative for cough, shortness of breath, wheezing and stridor.   Cardiovascular: Negative for chest pain, palpitations and leg swelling.  Gastrointestinal: Negative for nausea, vomiting, abdominal pain, diarrhea, constipation, blood in stool, abdominal distention and anal bleeding.  Genitourinary: Negative for dysuria and flank pain.  Musculoskeletal: Negative for arthralgias, gait problem, myalgias and neck pain.  Skin: Negative for color change and rash.  Neurological: Negative for dizziness and headaches.  Hematological: Negative for adenopathy. Does not bruise/bleed easily.  Psychiatric/Behavioral: Positive for sleep disturbance. Negative for suicidal ideas and dysphoric mood. The patient is not nervous/anxious.        Objective:   Physical Exam  Constitutional: She is oriented to person, place, and time. She appears well-developed and well-nourished. No distress.  HENT:  Head: Normocephalic and atraumatic.  Right Ear: External ear normal.  Left Ear: External ear normal.  Nose: Nose normal.  Mouth/Throat: Oropharynx is clear and moist. No oropharyngeal exudate.  Eyes: Conjunctivae are normal. Pupils are equal, round, and reactive to light. Right eye exhibits no discharge. Left eye exhibits no discharge. No scleral icterus.  Neck: Normal range of motion. Neck supple. No tracheal deviation present. No thyromegaly present.  Cardiovascular: Normal rate, regular rhythm, normal heart sounds and intact distal pulses.  Exam reveals  no gallop and no friction rub.   No murmur heard. Pulmonary/Chest: Effort normal and breath sounds normal. No accessory muscle usage. Not tachypneic. No respiratory distress. She has no decreased breath sounds. She has no wheezes. She has no rhonchi. She has  no rales. She exhibits no tenderness.  Musculoskeletal: Normal range of motion. She exhibits no edema and no tenderness.  Lymphadenopathy:    She has no cervical adenopathy.  Neurological: She is alert and oriented to person, place, and time. No cranial nerve deficit. She exhibits normal muscle tone. Coordination normal.  Skin: Skin is warm and dry. No rash noted. She is not diaphoretic. No erythema. No pallor.  Psychiatric: She has a normal mood and affect. Her behavior is normal. Judgment and thought content normal.          Assessment & Plan:

## 2013-07-25 NOTE — Progress Notes (Signed)
Pre-visit discussion using our clinic review tool. No additional management support is needed unless otherwise documented below in the visit note.  

## 2013-07-25 NOTE — Assessment & Plan Note (Signed)
Will check CMP and A1c with labs today. Continue Novolog, Lantus, and metformin. Unclear why pt not on ACEi. Will discuss starting ACE at follow up next month.

## 2013-07-25 NOTE — Assessment & Plan Note (Signed)
Mammogram ordered today 

## 2013-07-25 NOTE — Assessment & Plan Note (Signed)
Encouraged good sleep hygiene. Will stop alprazolam and start OTC Benadryl 25mg  at bedtime. If no improvement, consider Trazodone.

## 2013-07-25 NOTE — Assessment & Plan Note (Signed)
Will check lipids and LFTs with labs. Continue Atorvastatin. 

## 2013-07-26 ENCOUNTER — Telehealth: Payer: Self-pay

## 2013-07-26 ENCOUNTER — Telehealth: Payer: Self-pay | Admitting: *Deleted

## 2013-07-26 LAB — COMPREHENSIVE METABOLIC PANEL
ALBUMIN: 4.2 g/dL (ref 3.5–5.2)
ALK PHOS: 65 U/L (ref 39–117)
ALT: 19 U/L (ref 0–35)
AST: 26 U/L (ref 0–37)
BUN: 19 mg/dL (ref 6–23)
CHLORIDE: 101 meq/L (ref 96–112)
CO2: 26 mEq/L (ref 19–32)
Calcium: 9.4 mg/dL (ref 8.4–10.5)
Creatinine, Ser: 0.8 mg/dL (ref 0.4–1.2)
GFR: 76.51 mL/min (ref >60.00)
Glucose, Bld: 172 mg/dL — ABNORMAL HIGH (ref 70–99)
Potassium: 4.8 mEq/L (ref 3.5–5.1)
Sodium: 134 mEq/L — ABNORMAL LOW (ref 135–145)
Total Bilirubin: 0.6 mg/dL (ref 0.3–1.2)
Total Protein: 7.3 g/dL (ref 6.0–8.3)

## 2013-07-26 LAB — HEMOGLOBIN A1C: Hgb A1c MFr Bld: 8.8 % — ABNORMAL HIGH (ref 4.6–6.5)

## 2013-07-26 NOTE — Telephone Encounter (Signed)
Relevant patient education assigned to patient using Emmi. ° °

## 2013-07-26 NOTE — Telephone Encounter (Signed)
Called and spoke with patient about her labs, per patient she does not want to check her blood sugars at home at all. Stated she has been shown several times how to do it but just does not want to. She will not check them at home because she does not want to have diabetes. Patient appointment was scheduled for 2/10 to discuss this with Dr. Gilford Rile.

## 2013-07-26 NOTE — Telephone Encounter (Signed)
She has diabetes and is on insulin, which can cause drops in blood sugar. She must check blood sugars in order to assess and control diabetes. If she is unwilling to check blood sugars, we will need to transfer her care to another provider.

## 2013-07-26 NOTE — Telephone Encounter (Signed)
Message copied by Ronaldo Miyamoto on Fri Jul 26, 2013  2:01 PM ------      Message from: Ronette Deter A      Created: Fri Jul 26, 2013  1:09 PM       Labs show that blood sugars are elevated above goal. I would recommend that pt check blood sugars 1-2 times daily and record values to bring to next visit. We should set an earlier follow up of 1 month. ------

## 2013-07-29 ENCOUNTER — Telehealth: Payer: Self-pay | Admitting: Internal Medicine

## 2013-07-29 NOTE — Telephone Encounter (Signed)
Pt calling to tell Dr. Gilford Rile that she does feel that she needs a sleep aid.  States she has tried OTC meds and Benadryl and is still wide awake.

## 2013-07-29 NOTE — Telephone Encounter (Signed)
Spoke with patient, she stated she spoke with Dr.Walker about her taking Xanax. She was taking it to help her sleep but it was not working. At her last office visit Dr. Gilford Rile suggested she take Benadryl, she has tried Benadryl and Tylenol PM but neither of those are working either. She is still waking up in the middle of the night, would like something else called in to pharmacy.

## 2013-07-29 NOTE — Telephone Encounter (Signed)
OK. We discussed this at her visit. Let's try Trazodone 50mg  po qhs #30. With 1 refill.

## 2013-07-30 MED ORDER — TRAZODONE HCL 50 MG PO TABS: 50.0000 mg | ORAL_TABLET | Freq: Every day | ORAL | Status: DC

## 2013-07-30 NOTE — Telephone Encounter (Signed)
Patient informed and and prescription sent to the pharmacy per patient request.

## 2013-07-30 NOTE — Telephone Encounter (Signed)
Spoke with patient, she stated that is going to be hard for her to do. She is getting ready to go on a cruise and she has tried this before. It scares her and she panics. Also she is not on insulin.

## 2013-07-30 NOTE — Telephone Encounter (Signed)
We had in her records that she was taking Lantus 12 units? What is she taking for diabetes? Did another provider make a change in meds?

## 2013-07-31 ENCOUNTER — Encounter: Payer: Self-pay | Admitting: Emergency Medicine

## 2013-07-31 ENCOUNTER — Telehealth: Payer: Self-pay | Admitting: Internal Medicine

## 2013-07-31 DIAGNOSIS — IMO0001 Reserved for inherently not codable concepts without codable children: Secondary | ICD-10-CM

## 2013-07-31 DIAGNOSIS — E1165 Type 2 diabetes mellitus with hyperglycemia: Principal | ICD-10-CM

## 2013-07-31 NOTE — Telephone Encounter (Signed)
I would recommend that she start monitoring blood sugars at least 1-2 times daily and restart Lantus starting at 10units daily, however if she is unable/unwilling to check blood sugars, then not safe to be on insulin. Needs a follow up visit.

## 2013-07-31 NOTE — Telephone Encounter (Signed)
Fwd to Dr. Walker 

## 2013-07-31 NOTE — Telephone Encounter (Signed)
The patient is wanting to be referred to a Endocrinologist .

## 2013-07-31 NOTE — Telephone Encounter (Signed)
She is not taking anything, at her last visit it was noted that she was not taking it.

## 2013-08-08 NOTE — Telephone Encounter (Signed)
Appointments has already been scheduled for patient and she has agreed to make an attempt to check blood sugars. Patient requested to be referred to someone for help. Referral done.

## 2013-08-19 ENCOUNTER — Encounter: Payer: Self-pay | Admitting: *Deleted

## 2013-08-20 ENCOUNTER — Ambulatory Visit: Payer: Medicare Other | Admitting: Internal Medicine

## 2013-08-21 ENCOUNTER — Ambulatory Visit: Payer: Medicare Other | Admitting: Internal Medicine

## 2013-08-21 ENCOUNTER — Encounter: Payer: Self-pay | Admitting: Internal Medicine

## 2013-08-21 ENCOUNTER — Ambulatory Visit (INDEPENDENT_AMBULATORY_CARE_PROVIDER_SITE_OTHER): Payer: Medicare Other | Admitting: Internal Medicine

## 2013-08-21 VITALS — BP 106/72 | HR 84 | Temp 98.1°F | Ht 61.0 in | Wt 156.2 lb

## 2013-08-21 DIAGNOSIS — E119 Type 2 diabetes mellitus without complications: Secondary | ICD-10-CM | POA: Diagnosis not present

## 2013-08-21 MED ORDER — CANAGLIFLOZIN 100 MG PO TABS: ORAL_TABLET | ORAL | Status: DC

## 2013-08-21 NOTE — Progress Notes (Signed)
Pre-visit discussion using our clinic review tool. No additional management support is needed unless otherwise documented below in the visit note.  

## 2013-08-21 NOTE — Progress Notes (Signed)
Patient ID: Haley Young, female   DOB: 01/07/1951, 63 y.o.   MRN: [id number]  HPI: Haley Young is a 63 y.o.-year-old female, referred by her PCP, Dr. Gilford Rile, for management of DM2, non-insulin-dependent, uncontrolled, without complications.  Patient has been diagnosed with diabetes in 2010; she has not been on insulin 2 years ago: Lantus and NovoLog.  Last hemoglobin A1c was: Lab Results  Component Value Date   HGBA1C 8.8* 07/25/2013   HGBA1C 8.6* 06/28/2012   HGBA1C 8.5* 03/29/2012   Pt is on a regimen of: - Metformin 1000 mg po bid >> tolerates it well Tried Cinnamon and vinegar.  Pt checks her sugars 2x a day and they are: - am: 141-172 (214) - 2h after b'fast: n/c - before lunch: n/c - 2h after lunch: n/c - before dinner: 140-150s - 2h after dinner: n/c - bedtime: 152-189 - nighttime: n/c Did not check sugars during the cruise that she was on 2 weeks ago.  Unclear how high or how low she can get or if she has hypoglycemia awareness.   She started to change her diet 1 mo ago. Pt's meals are: - Breakfast: banana + PB or cereals - Lunch: yoghurt + peaches, ham sandwich; soup - Dinner: baked chicken; peas; salad - Snacks: Less sweets; more fruit. Does not drink soft drinks >> when came off >> lost 20 lbs. She went to the [facility] class right after dx.  She exercises 2-3x a week by walking - 30 min.   - no CKD, last BUN/creatinine:  Lab Results  Component Value Date   BUN 19 07/25/2013   CREATININE 0.8 07/25/2013   - last set of lipids: Lab Results  Component Value Date   CHOL 191 06/28/2012   HDL 42.20 06/28/2012   LDLCALC 127* 06/28/2012   LDLDIRECT 171.3 10/31/2011   TRIG 108.0 06/28/2012   CHOLHDL 5 06/28/2012   - last eye exam was in 08/2012. No DR.  - no numbness and tingling in her feet.  Pt has FH of DM in mother, aunts, cousin.  ROS: Constitutional: no weight gain/loss, no fatigue, + subjective hyperthermia  Eyes: no blurry vision, no  xerophthalmia ENT: no sore throat, no nodules palpated in throat, no dysphagia/odynophagia, no hoarseness, + tinnitus Cardiovascular: no CP/+ SOB/no palpitations/leg swelling Respiratory: no cough/+ SOB Gastrointestinal: no N/V/D/C Musculoskeletal: no muscle/joint aches Skin: no rashes Neurological: no tremors/numbness/tingling/dizziness Psychiatric: no depression/+ anxiety  Past Medical History  Diagnosis Date  . 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 GERD (gastroesophageal reflux disease)   . Depression   . Urinary incontinence   . Hx of colonic polyps   . Osteoporosis   . Diabetes mellitus   . Lipoma     R axilla   . Hyperhidrosis     especially with hot flashes    . Frequent UTI   . Pyelocystitis     as a child   . MRSA cellulitis     surgical wound infection  . Asthma   . Chicken pox   . Heart disease   . Heart murmur   . Hyperlipidemia   . Thyroid disease    Past Surgical History  Procedure Laterality Date  . Partial hysterectomy  1970  . Cardiac surgery  1955, 2005    pulmonary stenosis with valve replacement, Dr. Evelina Dun  . Valve replacement    . Pulmonary vein stenosis repair    . Abdominal hysterectomy  1970  History   Social History  . Marital Status: Married    Spouse Name: N/A    Number of Children: 2  . Years of Education: N/A   Occupational History  . Retired   . Sales     Social History Main Topics  . Smoking status: Never Smoker   . Smokeless tobacco: Never Used  . Alcohol Use: Yes     Comment: occasional wine   . Drug Use: No  . Sexual Activity: Not on file   Other Topics Concern  . Not on file   Social History Narrative   Divorced x 2      Sister is pt here- Surveyor, minerals      Caress for 41 year old mother      48 for daughter with down's syndrome      Adopted sone with Asberger's syndrome      G1P1      Cares for 2 disabled children    Current Outpatient Prescriptions on File Prior to  Visit  Medication Sig Dispense Refill  . aspirin 81 MG tablet Take 81 mg by mouth daily.        Marland Kitchen atorvastatin (LIPITOR) 20 MG tablet Take 1 tablet (20 mg total) by mouth daily.  90 tablet  3  . Cholecalciferol (VITAMIN D3) 2000 UNITS TABS Take 2,000 Units by mouth daily.      Marland Kitchen FLUoxetine (PROZAC) 40 MG capsule Take 1 capsule (40 mg total) by mouth daily.  90 capsule  3  . levothyroxine (SYNTHROID, LEVOTHROID) 100 MCG tablet Take 1 tablet (100 mcg total) by mouth daily.  90 tablet  3  . metFORMIN (GLUCOPHAGE) 1000 MG tablet Take 1 tablet (1,000 mg total) by mouth 2 (two) times daily with a meal.  180 tablet  3  . Omega-3 Fatty Acids (FISH OIL) 1200 MG CAPS Take 1,200 mg by mouth daily.      Marland Kitchen Phenylephrine-APAP-Guaifenesin (MUCINEX SINUS-MAX CONGESTION PO) Take by mouth at bedtime as needed.      . traZODone (DESYREL) 50 MG tablet Take 1 tablet (50 mg total) by mouth at bedtime.  30 tablet  1  . albuterol (PROAIR HFA) 108 (90 BASE) MCG/ACT inhaler Inhale 1-2 puffs into the lungs every 4 (four) hours as needed.        . fluticasone (FLONASE) 50 MCG/ACT nasal spray Place 2 sprays into the nose daily.  16 g  0  . LANTUS SOLOSTAR 100 UNIT/ML injection 12 Units Daily. unit      . NOVOLOG FLEXPEN 100 UNIT/ML injection as directed.       No current facility-administered medications on file prior to visit.   Allergies  Allergen Reactions  . Oxycodone Hcl     REACTION: hallucinations  . Oxycodone Hcl Other (See Comments)    halucinations   Family History  Problem Relation Age of Onset  . Diabetes Mother   . COPD Mother   . Obesity Mother   . Depression Mother   . Alcohol abuse Father   . Cirrhosis Father   . Diabetes Sister   . Down syndrome Brother   . Down syndrome Daughter   . Lupus Daughter   . Ulcerative colitis Daughter   . Coarctation of the aorta Sister    PE: BP 106/72  Pulse 84  Temp(Src) 98.1 F (36.7 C) (Oral)  Ht 5\' 1"  (1.549 m)  Wt 156 lb 4 oz (70.875 kg)  BMI  29.54 kg/m2  SpO2 96% Wt Readings from  Last 3 Encounters:  08/21/13 156 lb 4 oz (70.875 kg)  07/25/13 161 lb (73.029 kg)  10/30/12 160 lb 8 oz (72.802 kg)   Constitutional: overweight, in NAD Eyes: PERRLA, EOMI, no exophthalmos ENT: moist mucous membranes, no thyromegaly, no cervical lymphadenopathy Cardiovascular: RRR, + 2/6 SEM, no RG Respiratory: CTA B Gastrointestinal: abdomen soft, NT, ND, BS+ Musculoskeletal: no deformities, strength intact in all 4 Skin: moist, warm, no rashes Neurological: no tremor with outstretched hands, DTR normal in all 4  ASSESSMENT: 1. DM2, non-insulin-dependent, uncontrolled, without complications  PLAN:  1. Patient with uncontrolled diabetes, on oral antidiabetic regimen, which became insufficient - We discussed about options for treatment, and I suggested to:  Patient Instructions  Please continue Metformin 1000 mg 2x a day. Start Invokana 100 mg daily in am, before b'fast. Please let me know in 7-10 days if sugars better and I need to call in a prescription for you >> if sugars are at target, continue the same dose, but if sugars still high >> increase to 300 mg daily. Please return in 1 month with your sugar log.  - we discussed about SEs of Invokana, which are: dizziness (advised to be careful when stands from sitting position), decreased BP - usually not < normal (BP today is not low), and fungal UTIs (advised to let me know if develops one).  - we discussed at length about improving her diet >> given specific examples - Strongly advised her to start checking sugars at different times of the day - check 2 times a day, rotating checks - given sugar log and advised how to fill it and to bring it at next appt  - given foot care handout and explained the principles  - given instructions for hypoglycemia management "15-15 rule"  - advised for yearly eye exams - Return to clinic in 1 mo with sugar log

## 2013-08-21 NOTE — Patient Instructions (Signed)
Please continue Metformin 1000 mg 2x a day. Start Invokana 100 mg daily in am, before b'fast. Please let me know in 7-10 days if sugars better and I need to call in a prescription for you >> if sugars are at target, continue the same dose, but if sugars still high >> increase to 300 mg daily. Please return in 1 month with your sugar log.   PATIENT INSTRUCTIONS FOR TYPE 2 DIABETES:  **Please join MyChart!** - see attached instructions about how to join   DIET AND EXERCISE Diet and exercise is an important part of diabetic treatment.  We recommended aerobic exercise in the form of brisk walking (working between 40-60% of maximal aerobic capacity, similar to brisk walking) for 150 minutes per week (such as 30 minutes five days per week) along with 3 times per week performing 'resistance' training (using various gauge rubber tubes with handles) 5-10 exercises involving the major muscle groups (upper body, lower body and core) performing 10-15 repetitions (or near fatigue) each exercise. Start at half the above goal but build slowly to reach the above goals. If limited by weight, joint pain, or disability, we recommend daily walking in a swimming pool with water up to waist to reduce pressure from joints while allow for adequate exercise.    BLOOD GLUCOSES Monitoring your blood glucoses is important for continued management of your diabetes. Please check your blood glucoses 2-4 times a day: fasting, before meals and at bedtime (you can rotate these measurements - e.g. one day check before the 3 meals, the next day check before 2 of the meals and before bedtime, etc.   HYPOGLYCEMIA (low blood sugar) Hypoglycemia is usually a reaction to not eating, exercising, or taking too much insulin/ other diabetes drugs.  Symptoms include tremors, sweating, hunger, confusion, headache, etc. Treat IMMEDIATELY with 15 grams of Carbs:   4 glucose tablets    cup regular juice/soda   2 tablespoons raisins   4  teaspoons sugar   1 tablespoon honey Recheck blood glucose in 15 mins and repeat above if still symptomatic/blood glucose <100. Please contact our office at [phone] if you have questions about how to next handle your insulin.  RECOMMENDATIONS TO REDUCE YOUR RISK OF DIABETIC COMPLICATIONS: * Take your prescribed MEDICATION(S). * Follow a DIABETIC diet: Complex carbs, fiber rich foods, heart healthy fish twice weekly, (monounsaturated and polyunsaturated) fats * AVOID saturated/trans fats, high fat foods, >2,300 mg salt per day. * EXERCISE at least 5 times a week for 30 minutes or preferably daily.  * DO NOT SMOKE OR DRINK more than 1 drink a day. * Check your FEET every day. Do not wear tightfitting shoes. Contact us if you develop an ulcer * See your EYE doctor once a year or more if needed * Get a FLU shot once a year * Get a PNEUMONIA vaccine once before and once after age 81 years  GOALS:  * Your Hemoglobin A1c of <7%  * fasting sugars need to be <130 * after meals sugars need to be <180 (2h after you start eating) * Your Systolic BP should be 751 or lower  * Your Diastolic BP should be 80 or lower  * Your HDL (Good Cholesterol) should be 40 or higher  * Your LDL (Bad Cholesterol) should be 100 or lower  * Your Triglycerides should be 150 or lower  * Your Urine microalbumin (kidney function) should be <30 * Your Body Mass Index should be 25 or lower  We will be glad to help you achieve these goals. Our telephone number is: [phone].  Please consider the following ways to cut down carbs and fat and increase fiber and micronutrients in your diet:  - substitute whole grain for white bread or pasta - substitute brown rice for white rice - substitute 90-calorie flat bread pieces for slices of bread when possible - substitute sweet potatoes or yams for white potatoes - substitute humus for margarine - substitute tofu for cheese when possible - substitute almond or rice  milk for regular milk (would not drink soy milk daily due to concern for soy estrogen influence on breast cancer risk) - substitute dark chocolate for other sweets when possible - substitute water - can add lemon or orange slices for taste - for diet sodas (artificial sweeteners will trick your body that you can eat sweets without getting calories and will lead you to overeating and weight gain in the long run) - do not skip breakfast or other meals (this will slow down the metabolism and will result in more weight gain over time)  - can try smoothies made from fruit and almond/rice milk in am instead of regular breakfast - can also try old-fashioned (not instant) oatmeal made with almond/rice milk in am - order the dressing on the side when eating salad at a restaurant (pour less than half of the dressing on the salad) - eat as little meat as possible - can try juicing, but should not forget that juicing will get rid of the fiber, so would alternate with eating raw veg./fruits or drinking smoothies - use as little oil as possible, even when using olive oil - can dress a salad with a mix of balsamic vinegar and lemon juice, for e.g. - use agave nectar, stevia sugar, or regular sugar rather than artificial sweateners - steam or broil/roast veggies  - snack on veggies/fruit/nuts (unsalted, preferably) when possible, rather than processed foods - reduce or eliminate aspartame in diet (it is in diet sodas, chewing gum, etc) Read the labels!  Try to read Dr. Janene Harvey book: "Program for Reversing Diabetes" for the vegan concept and other ideas for healthy eating.

## 2013-08-29 ENCOUNTER — Ambulatory Visit (INDEPENDENT_AMBULATORY_CARE_PROVIDER_SITE_OTHER): Payer: Medicare Other | Admitting: Internal Medicine

## 2013-08-29 ENCOUNTER — Telehealth: Payer: Self-pay | Admitting: *Deleted

## 2013-08-29 ENCOUNTER — Encounter: Payer: Self-pay | Admitting: Internal Medicine

## 2013-08-29 ENCOUNTER — Other Ambulatory Visit: Payer: Self-pay | Admitting: *Deleted

## 2013-08-29 VITALS — BP 102/78 | HR 94 | Temp 98.0°F | Wt 152.0 lb

## 2013-08-29 DIAGNOSIS — E119 Type 2 diabetes mellitus without complications: Secondary | ICD-10-CM

## 2013-08-29 MED ORDER — CANAGLIFLOZIN 300 MG PO TABS: 1.0000 | ORAL_TABLET | Freq: Every morning | ORAL | Status: DC

## 2013-08-29 MED ORDER — TRAZODONE HCL 50 MG PO TABS: 50.0000 mg | ORAL_TABLET | Freq: Every day | ORAL | Status: DC

## 2013-08-29 NOTE — Telephone Encounter (Signed)
Pt called and lvm stating that she has been on samples of Invokana 100mg . She said that Dr Cruzita Lederer mentioned moving up to 300 mg. Pt states she would like to do 1 tablet in the morning and 1 at night to start with. Pt stated her blood sugars have been between 141 and 172. She feels they are getting better on the medication. Pt does not have an rx for the medication (she has a savings card), she would like to stay on this medication and would like an rx called in to her pharmacy. [phone]) Please advise.

## 2013-08-29 NOTE — Telephone Encounter (Signed)
Oh! She needs to take all in am.

## 2013-08-29 NOTE — Progress Notes (Signed)
Pre-visit discussion using our clinic review tool. No additional management support is needed unless otherwise documented below in the visit note.  

## 2013-08-29 NOTE — Progress Notes (Signed)
   Subjective:    Patient ID: Haley Young, female    DOB: [birth year], 63 y.o.   MRN: [id number]  HPI 63YO female with DM presents for follow up. Recently started on Ivokana by endocrinologist. Fasting BG between 142-177.  No side effects noted from the medication.  Review of Systems  Constitutional: Negative for fever, chills, appetite change, fatigue and unexpected weight change.  HENT: Negative for congestion, ear pain, sinus pressure, sore throat, trouble swallowing and voice change.   Eyes: Negative for visual disturbance.  Respiratory: Negative for cough, shortness of breath, wheezing and stridor.   Cardiovascular: Negative for chest pain, palpitations and leg swelling.  Gastrointestinal: Negative for nausea, vomiting, abdominal pain, diarrhea, constipation, blood in stool, abdominal distention and anal bleeding.  Genitourinary: Negative for dysuria and flank pain.  Musculoskeletal: Negative for arthralgias, gait problem, myalgias and neck pain.  Skin: Negative for color change and rash.  Neurological: Negative for dizziness and headaches.  Hematological: Negative for adenopathy. Does not bruise/bleed easily.  Psychiatric/Behavioral: Negative for suicidal ideas, sleep disturbance and dysphoric mood. The patient is not nervous/anxious.        Objective:    BP 102/78  Pulse 94  Temp(Src) 98 F (36.7 C) (Oral)  Wt 152 lb (68.947 kg)  SpO2 96% Physical Exam  Constitutional: She is oriented to person, place, and time. She appears well-developed and well-nourished. No distress.  HENT:  Head: Normocephalic and atraumatic.  Right Ear: External ear normal.  Left Ear: External ear normal.  Nose: Nose normal.  Mouth/Throat: Oropharynx is clear and moist. No oropharyngeal exudate.  Eyes: Conjunctivae are normal. Pupils are equal, round, and reactive to light. Right eye exhibits no discharge. Left eye exhibits no discharge. No scleral icterus.  Neck: Normal range of motion. Neck supple.  No tracheal deviation present. No thyromegaly present.  Cardiovascular: Normal rate, regular rhythm, normal heart sounds and intact distal pulses.  Exam reveals no gallop and no friction rub.   No murmur heard. Pulmonary/Chest: Effort normal and breath sounds normal. No accessory muscle usage. Not tachypneic. No respiratory distress. She has no decreased breath sounds. She has no wheezes. She has no rhonchi. She has no rales. She exhibits no tenderness.  Musculoskeletal: Normal range of motion. She exhibits no edema and no tenderness.  Lymphadenopathy:    She has no cervical adenopathy.  Neurological: She is alert and oriented to person, place, and time. No cranial nerve deficit. She exhibits normal muscle tone. Coordination normal.  Skin: Skin is warm and dry. No rash noted. She is not diaphoretic. No erythema. No pallor.  Psychiatric: She has a normal mood and affect. Her behavior is normal. Judgment and thought content normal.          Assessment & Plan:   Problem List Items Addressed This Visit   DIABETES-TYPE 2 - Primary      Lab Results  Component Value Date   HGBA1C 8.8* 07/25/2013   BG control much improved with addition of Invokana to Metformin. Will plan repeat A1c in 10/2013. Follow up with Dr. Cruzita Lederer as scheduled. Samples of Invokana given today.        Return if symptoms worsen or fail to improve.

## 2013-08-29 NOTE — Telephone Encounter (Signed)
Called pt and advised her that Dr Cruzita Lederer wants to increase her to 300 mg and she needs to take them in the AM. Pt understood. New rx sent to pt's pharmacy.

## 2013-08-29 NOTE — Assessment & Plan Note (Signed)
Lab Results  Component Value Date   HGBA1C 8.8* 07/25/2013   BG control much improved with addition of Invokana to Metformin. Will plan repeat A1c in 10/2013. Follow up with Dr. Cruzita Lederer as scheduled. Samples of Invokana given today.

## 2013-08-29 NOTE — Telephone Encounter (Signed)
Pt stated she has been taking it at night. She was suggesting to take in the AM and 1 in the PM.

## 2013-08-29 NOTE — Telephone Encounter (Signed)
Let's increase Invokana to 300 mg >> Haley Young can you please call this in for 30 days with 2 refills?  She needs to take all Invokana in am - I am not sure what she is referring to by taking 1 tab Bid.Marland KitchenMarland Kitchen

## 2013-09-04 ENCOUNTER — Telehealth: Payer: Self-pay | Admitting: Internal Medicine

## 2013-09-04 NOTE — Telephone Encounter (Signed)
Please read note below and advise.  

## 2013-09-04 NOTE — Telephone Encounter (Signed)
Let's send a PA.

## 2013-09-04 NOTE — Telephone Encounter (Signed)
Pt is calling and wanting to let you know that she is getting a new meter and she is not sure what to do about Invokana. She is doing 300 mg's daily and she can't afford that at all and is wanting to know the next step ?? Please advise.

## 2013-09-06 NOTE — Telephone Encounter (Signed)
Called pt and let her know that we are going to do a PA with her insurance company for the [employer]. Pt stated ok, the insurance will pay for some of it, but it was still $308 at the pharmacy.

## 2013-09-08 NOTE — Telephone Encounter (Signed)
Haley Young, can you please ask her if she had a h/o pancreatitis? If not, let's try Januvia 100 mg in am, before b'fast. I hope this is cheaper for her.

## 2013-09-09 ENCOUNTER — Other Ambulatory Visit: Payer: Self-pay | Admitting: *Deleted

## 2013-09-09 MED ORDER — SITAGLIPTIN PHOSPHATE 100 MG PO TABS: 100.0000 mg | ORAL_TABLET | Freq: Every day | ORAL | Status: DC

## 2013-09-09 NOTE — Telephone Encounter (Signed)
Called pt and asked her if she has had any h/o pancreatitis. Pt stated none. Advised her that Dr Cruzita Lederer said she wants to try Januvia 100 mg in am, before breakfast. She hopes this is cheaper for you. Pt stated she would let us know the cost.

## 2013-09-10 ENCOUNTER — Telehealth: Payer: Self-pay | Admitting: Internal Medicine

## 2013-09-10 NOTE — Telephone Encounter (Signed)
Pt states returning [facility] Dt call  Call Back: [phone]  Thank You

## 2013-09-10 NOTE — Telephone Encounter (Signed)
Noted  

## 2013-09-10 NOTE — Telephone Encounter (Signed)
Returned pt's call. Pt said she called her pharmacy and Januvia was going to be over $300. She said she cannot afford this. She said she would continue on Invokana. Pt said her sugars are still between 137 - 180's. The Invokana is only $120 per month. Please advise.

## 2013-09-10 NOTE — Telephone Encounter (Signed)
Called pt and she said she would stay on the [place] and she would let us know if there are any highs before her f/up appt on 3/18. Pt understood.

## 2013-09-11 ENCOUNTER — Telehealth: Payer: Self-pay | Admitting: Internal Medicine

## 2013-09-11 NOTE — Telephone Encounter (Signed)
Called pt and let her know that the samples are here at the [facility] office. Pt understood.

## 2013-09-11 NOTE — Telephone Encounter (Signed)
How can she get the package is it in [place] or [place]?

## 2013-09-12 ENCOUNTER — Encounter: Payer: Self-pay | Admitting: *Deleted

## 2013-09-13 ENCOUNTER — Ambulatory Visit (INDEPENDENT_AMBULATORY_CARE_PROVIDER_SITE_OTHER): Payer: Medicare Other | Admitting: Internal Medicine

## 2013-09-13 ENCOUNTER — Encounter: Payer: Self-pay | Admitting: Internal Medicine

## 2013-09-13 VITALS — BP 102/72 | HR 96 | Temp 97.7°F | Wt 148.0 lb

## 2013-09-13 DIAGNOSIS — E039 Hypothyroidism, unspecified: Secondary | ICD-10-CM

## 2013-09-13 DIAGNOSIS — Z0000 Encounter for general adult medical examination without abnormal findings: Secondary | ICD-10-CM

## 2013-09-13 DIAGNOSIS — E119 Type 2 diabetes mellitus without complications: Secondary | ICD-10-CM | POA: Diagnosis not present

## 2013-09-13 DIAGNOSIS — B3731 Acute candidiasis of vulva and vagina: Secondary | ICD-10-CM

## 2013-09-13 DIAGNOSIS — B373 Candidiasis of vulva and vagina: Secondary | ICD-10-CM | POA: Diagnosis not present

## 2013-09-13 DIAGNOSIS — I379 Nonrheumatic pulmonary valve disorder, unspecified: Secondary | ICD-10-CM

## 2013-09-13 DIAGNOSIS — Z23 Encounter for immunization: Secondary | ICD-10-CM | POA: Diagnosis not present

## 2013-09-13 LAB — MICROALBUMIN / CREATININE URINE RATIO
CREATININE, U: 71 mg/dL
Microalb Creat Ratio: 2 mg/g (ref 0.0–30.0)
Microalb, Ur: 1.4 mg/dL (ref 0.0–1.9)

## 2013-09-13 LAB — CBC WITH DIFFERENTIAL/PLATELET
Basophils Absolute: 0 10*3/uL (ref 0.0–0.1)
Basophils Relative: 0.3 % (ref 0.0–3.0)
EOS PCT: 1.8 % (ref 0.0–5.0)
Eosinophils Absolute: 0.2 10*3/uL (ref 0.0–0.7)
HEMATOCRIT: 45.1 % (ref 36.0–46.0)
HEMOGLOBIN: 15.3 g/dL — AB (ref 12.0–15.0)
LYMPHS ABS: 2 10*3/uL (ref 0.7–4.0)
Lymphocytes Relative: 16.5 % (ref 12.0–46.0)
MCHC: 34 g/dL (ref 30.0–36.0)
MCV: 89.9 fl (ref 78.0–100.0)
MONO ABS: 0.8 10*3/uL (ref 0.1–1.0)
Monocytes Relative: 6.5 % (ref 3.0–12.0)
NEUTROS ABS: 9 10*3/uL — AB (ref 1.4–7.7)
Neutrophils Relative %: 74.9 % (ref 43.0–77.0)
Platelets: 304 10*3/uL (ref 150.0–400.0)
RBC: 5.01 Mil/uL (ref 3.87–5.11)
RDW: 12.4 % (ref 11.5–14.6)
WBC: 11.9 10*3/uL — ABNORMAL HIGH (ref 4.5–10.5)

## 2013-09-13 LAB — COMPREHENSIVE METABOLIC PANEL
ALK PHOS: 62 U/L (ref 39–117)
ALT: 18 U/L (ref 0–35)
AST: 25 U/L (ref 0–37)
Albumin: 4.3 g/dL (ref 3.5–5.2)
BILIRUBIN TOTAL: 0.7 mg/dL (ref 0.3–1.2)
BUN: 23 mg/dL (ref 6–23)
CO2: 25 meq/L (ref 19–32)
Calcium: 10.1 mg/dL (ref 8.4–10.5)
Chloride: 94 mEq/L — ABNORMAL LOW (ref 96–112)
Creatinine, Ser: 0.9 mg/dL (ref 0.4–1.2)
GFR: 68.51 mL/min (ref >60.00)
GLUCOSE: 145 mg/dL — AB (ref 70–99)
Potassium: 4.8 mEq/L (ref 3.5–5.1)
SODIUM: 130 meq/L — AB (ref 135–145)
TOTAL PROTEIN: 7.7 g/dL (ref 6.0–8.3)

## 2013-09-13 LAB — LIPID PANEL
CHOLESTEROL: 147 mg/dL (ref 0–200)
HDL: 35.9 mg/dL — AB (ref >39.00)
LDL CALC: 92 mg/dL (ref 0–99)
Total CHOL/HDL Ratio: 4
Triglycerides: 94 mg/dL (ref 0.0–149.0)
VLDL: 18.8 mg/dL (ref 0.0–40.0)

## 2013-09-13 LAB — HM DIABETES FOOT EXAM: HM Diabetic Foot Exam: NORMAL

## 2013-09-13 LAB — TSH: TSH: 3.06 u[IU]/mL (ref 0.35–5.50)

## 2013-09-13 MED ORDER — FLUCONAZOLE 150 MG PO TABS: 150.0000 mg | ORAL_TABLET | ORAL | Status: DC

## 2013-09-13 MED ORDER — ZOSTER VACCINE LIVE 19400 UNT/0.65ML ~~LOC~~ SOLR: 0.6500 mL | Freq: Once | SUBCUTANEOUS | Status: DC

## 2013-09-13 NOTE — Progress Notes (Signed)
The patient is here for annual Medicare Wellness Examination and management of other chronic and acute problems.   The risk factors are reflected in the history.  The roster of all physicians providing medical care to patient - is listed in the Snapshot section of the chart.  Activities of daily living:   The patient is 100% independent in all ADLs: dressing, toileting, feeding as well as independent mobility. Patient lives with husband. No pets. One story home. Hard wood floors. Central heat and air.  Home safety :  The patient has smoke detectors in the home.  They wear seatbelts in their car. There are no firearms at home.  There is no violence in the home. They feel safe where they live.  Infectious Risks: There is no risks for hepatitis, STDs or HIV.  There is no  history of blood transfusion.  They have no travel history to infectious disease endemic areas of the world. Recent travel to [place].  Additional Health Care Providers: The patient has seen their dentist in the last six months. Dentist - Dr. Carman Ching They have seen their eye doctor in the last year. Opthalmologist - [facility] They deny hearing issues. They have deferred audiologic testing in the last year.   They do not  have excessive sun exposure. Discussed the need for sun protection: hats,long sleeves and use of sunscreen if there is significant sun exposure.  Dermatologist - Dr. Evorn Gong.  Diet: the importance of a healthy diet is discussed. They do have a healthy diet. Limits carbs.  The benefits of regular aerobic exercise were discussed. Patient exercises by walking 2-3 times per week.  Depression screen: there are no signs or vegative symptoms of depression- irritability, change in appetite, anhedonia, sadness/tearfullness.  Cognitive assessment: the patient manages all their financial and personal affairs and is actively engaged. They could relate day,date,year and events.  HCPOA - none in  place. Living Will - filled out in the past  The following portions of the patient's history were reviewed and updated as appropriate: allergies, current medications, past family history, past medical history,  past surgical history, past social history and problem list.  Visual acuity was not assessed per patient preference as they have regular follow up with their ophthalmologist. Hearing and body mass index were assessed and reviewed.   During the course of the visit the patient was educated and counseled about appropriate screening and preventive services including : fall prevention , diabetes screening, nutrition counseling, colorectal cancer screening, and recommended immunizations.    She notes some shortness of breath over the last few months, even at rest. Scheduled follow up with cardiology for Cardiac MRI in 11/2013. No chest pain, palpitations. No cough, fever, chills.  Outpatient Encounter Prescriptions as of 09/13/2013  Medication Sig  . aspirin 81 MG tablet Take 81 mg by mouth daily.    Marland Kitchen atorvastatin (LIPITOR) 20 MG tablet Take 1 tablet (20 mg total) by mouth daily.  . Canagliflozin 300 MG TABS Take 1 tablet (300 mg total) by mouth every morning.  . Cholecalciferol (VITAMIN D3) 2000 UNITS TABS Take 2,000 Units by mouth daily.  Marland Kitchen FLUoxetine (PROZAC) 40 MG capsule Take 1 capsule (40 mg total) by mouth daily.  . fluticasone (FLONASE) 50 MCG/ACT nasal spray Place 2 sprays into the nose daily.  Marland Kitchen levothyroxine (SYNTHROID, LEVOTHROID) 100 MCG tablet Take 1 tablet (100 mcg total) by mouth daily.  . metFORMIN (GLUCOPHAGE) 1000 MG tablet Take 1 tablet (1,000 mg total) by mouth  2 (two) times daily with a meal.  . Omega-3 Fatty Acids (FISH OIL) 1200 MG CAPS Take 1,200 mg by mouth daily.  Marland Kitchen Phenylephrine-APAP-Guaifenesin (MUCINEX SINUS-MAX CONGESTION PO) Take by mouth at bedtime as needed.  . traZODone (DESYREL) 50 MG tablet Take 1 tablet (50 mg total) by mouth at bedtime.  . [DISCONTINUED]  albuterol (PROAIR HFA) 108 (90 BASE) MCG/ACT inhaler Inhale 1-2 puffs into the lungs every 4 (four) hours as needed.    . [DISCONTINUED] sitaGLIPtin (JANUVIA) 100 MG tablet Take 1 tablet (100 mg total) by mouth daily before breakfast.  . fluconazole (DIFLUCAN) 150 MG tablet Take 1 tablet (150 mg total) by mouth once a week.  . zoster vaccine live, PF, (ZOSTAVAX) 20947 UNT/0.65ML injection Inject 19,400 Units into the skin once.    Review of Systems  Constitutional: Negative for fever, chills, appetite change, fatigue and unexpected weight change.  HENT: Negative for congestion, ear pain, sinus pressure, sore throat, trouble swallowing and voice change.   Eyes: Negative for visual disturbance.  Respiratory: Positive for shortness of breath. Negative for cough, wheezing and stridor.   Cardiovascular: Negative for chest pain, palpitations and leg swelling.  Gastrointestinal: Negative for nausea, vomiting, abdominal pain, diarrhea, constipation, blood in stool, abdominal distention and anal bleeding.  Genitourinary: Negative for dysuria and flank pain.  Musculoskeletal: Negative for arthralgias, gait problem, myalgias and neck pain.  Skin: Negative for color change and rash.  Neurological: Negative for dizziness and headaches.  Hematological: Negative for adenopathy. Does not bruise/bleed easily.  Psychiatric/Behavioral: Negative for suicidal ideas, sleep disturbance and dysphoric mood. The patient is not nervous/anxious.        Objective:    BP 102/72  Pulse 96  Temp(Src) 97.7 F (36.5 C)  Wt 148 lb (67.132 kg) Physical Exam  Constitutional: She is oriented to person, place, and time. She appears well-developed and well-nourished. No distress.  HENT:  Head: Normocephalic and atraumatic.  Right Ear: External ear normal.  Left Ear: External ear normal.  Nose: Nose normal.  Mouth/Throat: Oropharynx is clear and moist. No oropharyngeal exudate.  Eyes: Conjunctivae are normal. Pupils are  equal, round, and reactive to light. Right eye exhibits no discharge. Left eye exhibits no discharge. No scleral icterus.  Neck: Normal range of motion. Neck supple. No tracheal deviation present. No thyromegaly present.  Cardiovascular: Normal rate, regular rhythm and intact distal pulses.  Exam reveals no gallop and no friction rub.   Murmur: systolic, radiating to back. Pulmonary/Chest: Effort normal and breath sounds normal. No accessory muscle usage. Not tachypneic. No respiratory distress. She has no decreased breath sounds. She has no wheezes. She has no rales. She exhibits no tenderness. Right breast exhibits no inverted nipple, no mass, no nipple discharge, no skin change and no tenderness. Left breast exhibits no inverted nipple, no mass, no nipple discharge, no skin change and no tenderness. Breasts are symmetrical.  Abdominal: Soft. Bowel sounds are normal. She exhibits no distension and no mass. There is no tenderness. There is no rebound and no guarding.  Musculoskeletal: Normal range of motion. She exhibits no edema and no tenderness.  Lymphadenopathy:    She has no cervical adenopathy.  Neurological: She is alert and oriented to person, place, and time. No cranial nerve deficit. She exhibits normal muscle tone. Coordination normal.  Skin: Skin is warm and dry. No rash noted. She is not diaphoretic. No erythema. No pallor.  Psychiatric: She has a normal mood and affect. Her behavior is normal. Judgment and  thought content normal.          Assessment & Plan:   Problem List Items Addressed This Visit   DIABETES-TYPE 2   Hypothyroidism   Relevant Orders      TSH   Medicare annual wellness visit, initial - Primary     General medical exam normal today including breast exam except as noted. PAP and pelvic deferred given pt age, s/p hysterectomy and pt preference. Mammogram ordered. Colonoscopy UTD. Immunizations UTD except for Zostavax and Prevnar which were ordered today. Will  check labs including CBC, CMP, lipids. Encouraged healthy diet and regular exercise.    Relevant Orders      MM Digital Screening      CBC with Differential      Comprehensive metabolic panel      Lipid panel      Microalbumin / creatinine urine ratio      Vit D  25 hydroxy (rtn osteoporosis monitoring)      EKG 12-Lead   PULMONARY VALVE INSUFFICIENCY     Encouraged her to have follow up with cardiology at [facility] given some symptoms of dyspnea at rest. Recommended repeat ECHO versus Cardiac MRI for assessment of her pulmonary valve.     Other Visit Diagnoses   Candida vaginitis        Relevant Medications       ZOSTAVAX 01093 UNT/0.65ML [place] SOLR       fluconazole (DIFLUCAN) tablet 150 mg    Need for prophylactic vaccination against Streptococcus pneumoniae (pneumococcus)        Relevant Orders       Pneumococcal conjugate vaccine 13-valent (Completed)        Return in about 3 months (around 12/14/2013) for Recheck.

## 2013-09-13 NOTE — Assessment & Plan Note (Signed)
Encouraged her to have follow up with cardiology at [facility] given some symptoms of dyspnea at rest. Recommended repeat ECHO versus Cardiac MRI for assessment of her pulmonary valve.

## 2013-09-13 NOTE — Assessment & Plan Note (Signed)
General medical exam normal today including breast exam except as noted. PAP and pelvic deferred given pt age, s/p hysterectomy and pt preference. Mammogram ordered. Colonoscopy UTD. Immunizations UTD except for Zostavax and Prevnar which were ordered today. Will check labs including CBC, CMP, lipids. Encouraged healthy diet and regular exercise.

## 2013-09-13 NOTE — Progress Notes (Signed)
Pre visit review using our clinic review tool, if applicable. No additional management support is needed unless otherwise documented below in the visit note. 

## 2013-09-14 LAB — VITAMIN D 25 HYDROXY (VIT D DEFICIENCY, FRACTURES): Vit D, 25-Hydroxy: 44 ng/mL (ref 30–89)

## 2013-09-17 ENCOUNTER — Telehealth: Payer: Self-pay | Admitting: *Deleted

## 2013-09-17 NOTE — Telephone Encounter (Signed)
Called patient to inform her of lab results. While on the phone patient requested appointment to be seen today for an infection in her eye. Informed her none of the providers has an openings but she could be seen tomorrow by NP. Patient declined and stated she would call her eye doctor.

## 2013-09-18 ENCOUNTER — Encounter: Payer: Self-pay | Admitting: Emergency Medicine

## 2013-09-24 DIAGNOSIS — F329 Major depressive disorder, single episode, unspecified: Secondary | ICD-10-CM | POA: Diagnosis not present

## 2013-09-24 DIAGNOSIS — Q221 Congenital pulmonary valve stenosis: Secondary | ICD-10-CM | POA: Diagnosis not present

## 2013-09-24 DIAGNOSIS — I379 Nonrheumatic pulmonary valve disorder, unspecified: Secondary | ICD-10-CM | POA: Diagnosis not present

## 2013-09-24 DIAGNOSIS — I080 Rheumatic disorders of both mitral and aortic valves: Secondary | ICD-10-CM | POA: Diagnosis not present

## 2013-09-24 DIAGNOSIS — F411 Generalized anxiety disorder: Secondary | ICD-10-CM | POA: Diagnosis not present

## 2013-09-24 DIAGNOSIS — E785 Hyperlipidemia, unspecified: Secondary | ICD-10-CM | POA: Diagnosis not present

## 2013-09-24 DIAGNOSIS — R0609 Other forms of dyspnea: Secondary | ICD-10-CM | POA: Diagnosis not present

## 2013-09-24 DIAGNOSIS — R079 Chest pain, unspecified: Secondary | ICD-10-CM | POA: Diagnosis not present

## 2013-09-24 DIAGNOSIS — F3289 Other specified depressive episodes: Secondary | ICD-10-CM | POA: Diagnosis not present

## 2013-09-25 ENCOUNTER — Ambulatory Visit (INDEPENDENT_AMBULATORY_CARE_PROVIDER_SITE_OTHER): Payer: Medicare Other | Admitting: Internal Medicine

## 2013-09-25 ENCOUNTER — Encounter: Payer: Self-pay | Admitting: Internal Medicine

## 2013-09-25 DIAGNOSIS — E119 Type 2 diabetes mellitus without complications: Secondary | ICD-10-CM

## 2013-09-25 NOTE — Progress Notes (Signed)
Patient ID: Haley Young, female   DOB: 08-Jun-1951, 63 y.o.   MRN: [id number]  HPI: Haley Young is a 63 y.o.-year-old female, returning for f/u for DM2, dx 2010, non-insulin-dependent, uncontrolled, without complications.  She saw cardiology at [facility] (Dr Leonides Schanz) for fatigue and SOB >> EKG stress test >> several blockages. She will have the catheterization next week, may need stents. 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 Per cardiology, this is not what is causing pt's fatigue and SOB, but her coronary blockages. She is very anxious Re: the procedure.  Last hemoglobin A1c was: Lab Results  Component Value Date   HGBA1C 8.8* 07/25/2013   HGBA1C 8.6* 06/28/2012   HGBA1C 8.5* 03/29/2012   Pt is on a regimen of: - Metformin 1000 mg po bid >> tolerates it well - Invokana 100 mg daily added 08/2013 Tried Cinnamon and vinegar.  Pt checks her sugars 2x a day and they are: - am: 141-172 (214) >> 125-176 - 2h after b'fast: n/c - before lunch: n/c - 2h after lunch: n/c - before dinner: 140-150s >> n/c - 2h after dinner: n/c >> 154-197 - bedtime: 152-189 - nighttime: n/c >> 138-240, most 138-170 Did not check sugars during the cruise that she was on 2 weeks ago.  Unclear how high or how low she can get or if she has hypoglycemia awareness.   She started to change her diet. Pt's meals are: - Breakfast: banana + PB or cereals - Lunch: yoghurt + peaches, ham sandwich; soup - Dinner: baked chicken; peas; salad Less sweets; more fruit. Does not drink soft drinks >> when came off >> lost 20 lbs. She went to the [facility] class right after dx.  She exercises 2-3x a week by walking - 30 min.   - no CKD, last BUN/creatinine:  Lab Results  Component Value Date   BUN 23 09/13/2013   CREATININE 0.9 09/13/2013   - last set of lipids: Lab Results  Component Value Date   CHOL 147 09/13/2013   HDL 35.90* 09/13/2013   LDLCALC 92 09/13/2013   LDLDIRECT 171.3  10/31/2011   TRIG 94.0 09/13/2013   CHOLHDL 4 09/13/2013   - last eye exam was in 08/2012. No DR.  - no numbness and tingling in her feet.  Pt has FH of DM in mother, aunts, cousin.  ROS: .Constitutional: no weight gain/loss, + fatigue, no subjective hyperthermia/hypothermia Eyes: no blurry vision, no xerophthalmia ENT: no sore throat, no nodules palpated in throat, no dysphagia/odynophagia, no hoarseness Cardiovascular: no CP/+ SOB/no palpitations/leg swelling Respiratory: no cough/+ SOB Gastrointestinal: no N/V/D/C Musculoskeletal: no muscle/joint aches Skin: no rashes   PE: There were no vitals taken for this visit. Pt in a hurry to get to work after being late as she went to another [place] site first >> left before checking vital signs. Wt Readings from Last 3 Encounters:  09/13/13 148 lb (67.132 kg)  08/29/13 152 lb (68.947 kg)  08/21/13 156 lb 4 oz (70.875 kg)   Constitutional: overweight, in NAD Eyes: PERRLA, EOMI, no exophthalmos ENT: moist mucous membranes, no thyromegaly, no cervical lymphadenopathy Cardiovascular: RRR, + 3/6 SEM, no RG Respiratory: CTA B Gastrointestinal: abdomen soft, NT, ND, BS+ Musculoskeletal: no deformities, strength intact in all 4 Skin: moist, warm, no rashes Neurological: no tremor with outstretched hands, DTR normal in all 4  ASSESSMENT: 1. DM2, non-insulin-dependent, uncontrolled, without complications  PLAN:  1. Patient with uncontrolled diabetes, on oral antidiabetic regimen, which  is still insufficient despite addition of Invokana and increased to 300 mg. She will have a catheterization at [place] next week and may have stents placed. She was advised by cardiology to check with me whether we can stay on the same regimen for now before the catheterization, and I think we can. I believe her sugars will improve if her heart status improves.  - We discussed about options for treatment, and I suggested to:  Patient Instructions  Please continue  current diabetes regimen. Please return in 1.5 months with your sugar log.  - no SEs of Invokana, but no obvious benefit, either... - continue checking sugars at different times of the day - check 2 times a day, rotating checks - needs new eye exam - Return to clinic in 1.5 mo with sugar log

## 2013-09-25 NOTE — Patient Instructions (Signed)
Please continue current diabetes regimen. Please return in 1.5 months with your sugar log.

## 2013-10-01 DIAGNOSIS — R079 Chest pain, unspecified: Secondary | ICD-10-CM | POA: Diagnosis not present

## 2013-10-01 DIAGNOSIS — Q213 Tetralogy of Fallot: Secondary | ICD-10-CM | POA: Diagnosis not present

## 2013-10-01 DIAGNOSIS — I2789 Other specified pulmonary heart diseases: Secondary | ICD-10-CM | POA: Diagnosis not present

## 2013-10-01 DIAGNOSIS — Z01810 Encounter for preprocedural cardiovascular examination: Secondary | ICD-10-CM | POA: Diagnosis not present

## 2013-10-01 DIAGNOSIS — Z79899 Other long term (current) drug therapy: Secondary | ICD-10-CM | POA: Diagnosis not present

## 2013-10-28 ENCOUNTER — Telehealth: Payer: Self-pay | Admitting: Internal Medicine

## 2013-10-28 ENCOUNTER — Ambulatory Visit: Payer: Medicare Other | Admitting: Adult Health

## 2013-10-28 DIAGNOSIS — R05 Cough: Secondary | ICD-10-CM | POA: Diagnosis not present

## 2013-10-28 DIAGNOSIS — J301 Allergic rhinitis due to pollen: Secondary | ICD-10-CM | POA: Diagnosis not present

## 2013-10-28 DIAGNOSIS — R059 Cough, unspecified: Secondary | ICD-10-CM | POA: Diagnosis not present

## 2013-10-28 NOTE — Telephone Encounter (Signed)
Needs to be seen

## 2013-10-28 NOTE — Telephone Encounter (Signed)
Haley Young - See below note. Just FYI

## 2013-10-28 NOTE — Telephone Encounter (Signed)
Pt wanted to know if you could call her something in for coughing.  She had an appointment with raquel but was late for her appointment

## 2013-10-28 NOTE — Telephone Encounter (Signed)
Spoke with patient, she stated that is ok. She went to urgent care. "It wasn't even 2 minutes and yall wouldn't even see me but that's ok"

## 2013-10-28 NOTE — Telephone Encounter (Signed)
Pt wanted to know if you could call her something in for coughing.  She had an appointment with raquel but was late for her appointment °

## 2013-10-29 ENCOUNTER — Telehealth: Payer: Self-pay | Admitting: Internal Medicine

## 2013-10-30 ENCOUNTER — Telehealth: Payer: Self-pay | Admitting: Internal Medicine

## 2013-10-30 NOTE — Telephone Encounter (Signed)
Pt is out of [place]. She would like to know if you have any samples to give her. Pt states it is over $300. Please advise.

## 2013-10-31 NOTE — Telephone Encounter (Signed)
Called pt and lvm advising her that she can come to the [place] office to pick up samples of Invokana, gave her the address and phone number.

## 2013-11-18 ENCOUNTER — Telehealth: Payer: Self-pay | Admitting: Internal Medicine

## 2013-11-18 MED ORDER — GLUCOSE BLOOD VI STRP: ORAL_STRIP | Status: DC

## 2013-11-18 NOTE — Telephone Encounter (Signed)
Pt needs test strips for her one touch ulta

## 2013-11-18 NOTE — Telephone Encounter (Signed)
Rx sent 

## 2013-11-20 ENCOUNTER — Ambulatory Visit: Payer: Medicare Other | Admitting: Internal Medicine

## 2013-11-20 DIAGNOSIS — I379 Nonrheumatic pulmonary valve disorder, unspecified: Secondary | ICD-10-CM | POA: Diagnosis not present

## 2013-11-20 DIAGNOSIS — R0609 Other forms of dyspnea: Secondary | ICD-10-CM | POA: Diagnosis not present

## 2013-11-20 DIAGNOSIS — Z954 Presence of other heart-valve replacement: Secondary | ICD-10-CM | POA: Diagnosis not present

## 2013-11-20 DIAGNOSIS — E785 Hyperlipidemia, unspecified: Secondary | ICD-10-CM | POA: Diagnosis not present

## 2013-11-20 DIAGNOSIS — Q221 Congenital pulmonary valve stenosis: Secondary | ICD-10-CM | POA: Diagnosis not present

## 2013-11-24 ENCOUNTER — Other Ambulatory Visit: Payer: Self-pay | Admitting: Internal Medicine

## 2013-11-25 ENCOUNTER — Other Ambulatory Visit: Payer: Self-pay | Admitting: *Deleted

## 2013-11-25 MED ORDER — GLUCOSE BLOOD VI STRP: ORAL_STRIP | Status: DC

## 2013-11-27 ENCOUNTER — Ambulatory Visit (INDEPENDENT_AMBULATORY_CARE_PROVIDER_SITE_OTHER): Payer: Medicare Other | Admitting: Internal Medicine

## 2013-11-27 ENCOUNTER — Encounter: Payer: Self-pay | Admitting: Internal Medicine

## 2013-11-27 VITALS — BP 104/68 | HR 93 | Temp 97.9°F | Resp 12 | Wt 140.8 lb

## 2013-11-27 DIAGNOSIS — E119 Type 2 diabetes mellitus without complications: Secondary | ICD-10-CM | POA: Diagnosis not present

## 2013-11-27 LAB — HEMOGLOBIN A1C: Hgb A1c MFr Bld: 8.8 % — ABNORMAL HIGH (ref 4.6–6.5)

## 2013-11-27 NOTE — Patient Instructions (Signed)
Please continue Metformin 1000 mg 2x a day. Continue Invokana 300 mg daily. Please stop at the lab.  Please return in 3 months with your sugar log.

## 2013-11-27 NOTE — Progress Notes (Signed)
Patient ID: Haley Young, female   DOB: Nov 30, 1950, 63 y.o.   MRN: [id number]  HPI: Haley Young is a 63 y.o.-year-old female, returning for f/u for DM2, dx 2010, non-insulin-dependent, uncontrolled, without complications. Last visit 2 mo ago.  She saw cardiology at [facility] (Dr. Leonides Schanz) for fatigue and SOB >> EKG stress test >> several blockages. She had a catheterization >> no blockages. 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  Last hemoglobin A1c was: Lab Results  Component Value Date   HGBA1C 8.8* 07/25/2013   HGBA1C 8.6* 06/28/2012   HGBA1C 8.5* 03/29/2012   Pt is on a regimen of: - Metformin 1000 mg po bid >> tolerates it well - Invokana 300 mg daily - added 08/2013 >> she cannot afford this for the next 6 mo, then will change insurances Tried insulin Lantus and Novolog - ~2011 Tried Cinnamon and vinegar.  Pt checks her sugars 2x a day and they are a little better later in the day, but not many checks then. - am: 141-172 (214) >> 125-176 >> 136-177 - 2h after b'fast: n/c - before lunch: n/c - 2h after lunch: n/c - before dinner: 140-150s >> n/c - 2h after dinner: n/c >> 154-197 >> n/c - bedtime: 152-189 >> 140-150 - nighttime: n/c >> 138-240, most 138-170 >> n/c Unclear how high or how low she can get or if she has hypoglycemia awareness.   She started to change her diet. Pt's meals are: - Breakfast: banana + PB or cereals; coffee + sweetener. - Lunch: yoghurt + peaches, ham sandwich; soup - Dinner: baked chicken; peas; salad Less sweets; more fruit. Does not drink soft drinks >> when came off >> lost 20 lbs. She went to the [facility] class right after dx.  She exercises 2-3x a week by walking - 30 min.   - no CKD, last BUN/creatinine:  Lab Results  Component Value Date   BUN 23 09/13/2013   CREATININE 0.9 09/13/2013   - last set of lipids: Lab Results  Component Value Date   CHOL 147 09/13/2013   HDL 35.90* 09/13/2013   LDLCALC 92 09/13/2013   LDLDIRECT 171.3 10/31/2011   TRIG 94.0 09/13/2013   CHOLHDL 4 09/13/2013   - last eye exam was in 08/2012. No DR.  - no numbness and tingling in her feet.  Pt has FH of DM in mother, aunts, cousin.  ROS: .Constitutional: no weight gain/loss, + fatigue, no subjective hyperthermia/hypothermia Eyes: no blurry vision, no xerophthalmia ENT: no sore throat, no nodules palpated in throat, no dysphagia/odynophagia, no hoarseness Cardiovascular: no CP/+ SOB/no palpitations/leg swelling Respiratory: no cough/+ SOB Gastrointestinal: no N/V/D/C Musculoskeletal: no muscle/joint aches Skin: no rashes  PE: BP 104/68  Pulse 93  Temp(Src) 97.9 F (36.6 C) (Oral)  Resp 12  Wt 140 lb 12.8 oz (63.866 kg)  SpO2 95%  Wt Readings from Last 3 Encounters:  11/27/13 140 lb 12.8 oz (63.866 kg)  09/13/13 148 lb (67.132 kg)  08/29/13 152 lb (68.947 kg)   Constitutional: overweight, in NAD Eyes: PERRLA, EOMI, no exophthalmos ENT: moist mucous membranes, no thyromegaly, no cervical lymphadenopathy Cardiovascular: RRR, + 3/6 SEM, no RG Respiratory: CTA B Gastrointestinal: abdomen soft, NT, ND, BS+ Musculoskeletal: no deformities, strength intact in all 4 Skin: moist, warm, no rashes Neurological: no tremor with outstretched hands, DTR normal in all 4  ASSESSMENT: 1. DM2, non-insulin-dependent, uncontrolled, without complications  PLAN:  1. Patient with uncontrolled diabetes, on oral antidiabetic regimen, with  improved control after addition of Invokana and increased to 300 mg.  - I gave her a discount card for Invokana and will start a PA for it. Also given samples. - We discussed about options for treatment, and I suggested to:  Patient Instructions  Please continue Metformin 1000 mg 2x a day. Continue Invokana 300 mg daily. Please stop at the lab. Please return in 3 months with your sugar log.  - no SEs of Invokana - continue checking sugars at different times of the day - check 2 times a day,  rotating checks - needs new eye exam - will check a1c today - Return to clinic in 1.5 mo with sugar log   Office Visit on 11/27/2013  Component Date Value Ref Range Status  . Hemoglobin A1C 11/27/2013 8.8* 4.6 - 6.5 % Final   Glycemic Control Guidelines for People with Diabetes:Non Diabetic:  <6%Goal of Therapy: <7%Additional Action Suggested:  >8%    HbA1c stable, higher than expected based on sugars. We may need a fructosamine check at next visit.

## 2014-01-13 ENCOUNTER — Telehealth: Payer: Self-pay | Admitting: Internal Medicine

## 2014-01-13 ENCOUNTER — Other Ambulatory Visit: Payer: Self-pay | Admitting: *Deleted

## 2014-01-13 MED ORDER — CANAGLIFLOZIN 300 MG PO TABS: 1.0000 | ORAL_TABLET | Freq: Every morning | ORAL | Status: DC

## 2014-01-13 NOTE — Telephone Encounter (Signed)
Patient needs her Rx filled Invokana 300  Pharmacy: [facility]   Thank You :)

## 2014-01-13 NOTE — Telephone Encounter (Signed)
Done

## 2014-01-30 ENCOUNTER — Ambulatory Visit: Payer: Medicare Other | Admitting: Internal Medicine

## 2014-02-03 ENCOUNTER — Encounter: Payer: Self-pay | Admitting: Internal Medicine

## 2014-02-03 ENCOUNTER — Ambulatory Visit (INDEPENDENT_AMBULATORY_CARE_PROVIDER_SITE_OTHER): Payer: Medicare Other | Admitting: Internal Medicine

## 2014-02-03 VITALS — BP 104/70 | HR 87 | Temp 97.6°F | Resp 12 | Wt 138.0 lb

## 2014-02-03 DIAGNOSIS — E119 Type 2 diabetes mellitus without complications: Secondary | ICD-10-CM

## 2014-02-03 MED ORDER — CANAGLIFLOZIN-METFORMIN HCL 150-500 MG PO TABS: 2.0000 | ORAL_TABLET | Freq: Every day | ORAL | Status: DC

## 2014-02-03 NOTE — Progress Notes (Signed)
Patient ID: Haley Young, female   DOB: Nov 02, 1950, 63 y.o.   MRN: [id number]  HPI: Haley Young is a 63 y.o.-year-old female, returning for f/u for DM2, dx 2010, non-insulin-dependent, uncontrolled, without complications. Last visit 2 mo ago.  Last hemoglobin A1c was: Lab Results  Component Value Date   HGBA1C 8.8* 11/27/2013   HGBA1C 8.8* 07/25/2013   HGBA1C 8.6* 06/28/2012   Pt is on a regimen of: - Metformin 1000 mg po bid >> tolerates it well - Invokana 300 mg daily - added 08/2013  - costs her 300$  Tried insulin Lantus and Novolog - ~2011 Tried Cinnamon and vinegar.  Pt checks her sugars 2x a day and they are a little better later in the day, but not many checks then. - am: 141-172 (214) >> 125-176 >> 136-177 >> 110-141 - 2h after b'fast: n/c - before lunch: n/c - 2h after lunch: n/c - before dinner: 140-150s >> n/c - 2h after dinner: n/c >> 154-197 >> n/c - bedtime: 152-189 >> 140-150 >> 114-160, few spikes in the 180s, 1x 202 - nighttime: n/c >> 138-240, most 138-170 >> n/c Unclear how high or how low she can get or if she has hypoglycemia awareness.   She started to change her diet. Pt's meals are: - Breakfast: banana + PB or cereals; coffee + sweetener. - Lunch: yoghurt + peaches, ham sandwich; soup - Dinner: baked chicken; peas; salad Less sweets; more fruit. Does not drink soft drinks >> when came off >> lost 20 lbs - now lost 3 more lbs since last visit.  She went to the [facility] class right after dx.  She exercises 2-3x a week by walking - 30 min.   - no CKD, last BUN/creatinine:  Lab Results  Component Value Date   BUN 23 09/13/2013   CREATININE 0.9 09/13/2013   - last set of lipids: Lab Results  Component Value Date   CHOL 147 09/13/2013   HDL 35.90* 09/13/2013   LDLCALC 92 09/13/2013   LDLDIRECT 171.3 10/31/2011   TRIG 94.0 09/13/2013   CHOLHDL 4 09/13/2013   - last eye exam was in 08/2012. No DR.  - no numbness and tingling in her feet.  She  saw cardiology at [facility] (Dr. Leonides Schanz) for fatigue and SOB >> EKG stress test >> several presumed blockages. She had a catheterization >> no blockages. 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  I reviewed pt's medications, allergies, PMH, social hx, family hx and no changes required, except as mentioned above.  ROS: .Constitutional: no weight gain/loss, no fatigue, no subjective hyperthermia/hypothermia Eyes: no blurry vision, no xerophthalmia ENT: no sore throat, no nodules palpated in throat, no dysphagia/odynophagia, no hoarseness Cardiovascular: no CP/SOB/no palpitations/leg swelling Respiratory: no cough/SOB Gastrointestinal: no N/V/D/+ C Musculoskeletal: no muscle/joint aches Skin: no rashes  PE: BP 104/70  Pulse 87  Temp(Src) 97.6 F (36.4 C) (Oral)  Resp 12  Wt 138 lb (62.596 kg)  SpO2 96%  Wt Readings from Last 3 Encounters:  02/03/14 138 lb (62.596 kg)  11/27/13 140 lb 12.8 oz (63.866 kg)  09/13/13 148 lb (67.132 kg)   Constitutional: overweight, in NAD Eyes: PERRLA, EOMI, no exophthalmos ENT: moist mucous membranes, no thyromegaly, no cervical lymphadenopathy Cardiovascular: RRR, + 3/6 SEM, no RG Respiratory: CTA B Gastrointestinal: abdomen soft, NT, ND, BS+ Musculoskeletal: no deformities, strength intact in all 4 Skin: moist, warm, no rashes Neurological: no tremor with outstretched hands, DTR normal in  all 4  ASSESSMENT: 1. DM2, non-insulin-dependent, uncontrolled, without complications  PLAN:  1. Patient with uncontrolled diabetes, on oral antidiabetic regimen, with improved control after addition of Invokana and increased to 300 mg. She, however, cannot really afford Invokana - will give her a new discount card and use it for InvokaMet, but after this, will need to switch to a more affordable med.  - At last visit, we got a HbA1c at 8.8%, which is higher than I would expect from her sugar log; we may need a fructosamine check at next visit.  - We  discussed about options for treatment, and I suggested to:  Patient Instructions  Please take InvokaMet 2 tablets in am ([phone] mg) daily in am. Continue Metformin 1000 mg with dinner. Please return in 1.5 month with your sugar log. We will check labs then. - no SEs of Invokana - continue checking sugars at different times of the day - check 2 times a day, rotating checks - needs new eye exam >> advised to schedule a new one - will check a1c at next visit - Return to clinic in 1.5 mo with sugar log >> we will likely need to switch to Januvia/Tradjenta at next visit

## 2014-02-03 NOTE — Patient Instructions (Signed)
Please take InvokaMet 2 tablets in am ([phone] mg) daily in am. Continue Metformin 1000 mg with dinner.  Please return in 1.5 month with your sugar log. We will check labs then.

## 2014-02-18 ENCOUNTER — Other Ambulatory Visit: Payer: Self-pay | Admitting: *Deleted

## 2014-02-18 DIAGNOSIS — E785 Hyperlipidemia, unspecified: Secondary | ICD-10-CM

## 2014-02-18 DIAGNOSIS — E119 Type 2 diabetes mellitus without complications: Secondary | ICD-10-CM

## 2014-02-18 DIAGNOSIS — E039 Hypothyroidism, unspecified: Secondary | ICD-10-CM

## 2014-02-18 MED ORDER — ATORVASTATIN CALCIUM 20 MG PO TABS: 20.0000 mg | ORAL_TABLET | Freq: Every day | ORAL | Status: DC

## 2014-02-18 MED ORDER — METFORMIN HCL 1000 MG PO TABS: 1000.0000 mg | ORAL_TABLET | Freq: Two times a day (BID) | ORAL | Status: DC

## 2014-02-18 MED ORDER — LEVOTHYROXINE SODIUM 100 MCG PO TABS: 100.0000 ug | ORAL_TABLET | Freq: Every day | ORAL | Status: DC

## 2014-02-18 MED ORDER — CANAGLIFLOZIN-METFORMIN HCL 150-500 MG PO TABS: 2.0000 | ORAL_TABLET | Freq: Every day | ORAL | Status: DC

## 2014-02-18 MED ORDER — TRAZODONE HCL 50 MG PO TABS: 50.0000 mg | ORAL_TABLET | Freq: Every day | ORAL | Status: DC

## 2014-02-18 NOTE — Telephone Encounter (Signed)
Rx needs to be sent to [facility]/90 day supply. Done

## 2014-02-24 ENCOUNTER — Encounter: Payer: Self-pay | Admitting: Internal Medicine

## 2014-02-24 ENCOUNTER — Ambulatory Visit (INDEPENDENT_AMBULATORY_CARE_PROVIDER_SITE_OTHER): Payer: Medicare Other | Admitting: Internal Medicine

## 2014-02-24 VITALS — BP 98/58 | HR 86 | Temp 98.2°F | Ht 61.0 in | Wt 138.8 lb

## 2014-02-24 DIAGNOSIS — F4323 Adjustment disorder with mixed anxiety and depressed mood: Secondary | ICD-10-CM | POA: Diagnosis not present

## 2014-02-24 MED ORDER — VENLAFAXINE HCL ER 37.5 MG PO CP24: 37.5000 mg | ORAL_CAPSULE | Freq: Every day | ORAL | Status: DC

## 2014-02-24 MED ORDER — LORAZEPAM 0.5 MG PO TABS: 0.5000 mg | ORAL_TABLET | Freq: Two times a day (BID) | ORAL | Status: DC | PRN

## 2014-02-24 NOTE — Progress Notes (Signed)
Subjective:    Patient ID: Haley Young, female    DOB: Jan 10, 1951, 63 y.o.   MRN: [id number]  HPI 63YO female presents for acute visit.  Difficult time for her recently. Husband lost his job. Son has Aspergers and has struggled with anger management, leaning on her frequently for help. She is tearful and feels anxious, overwhelmed. Not sleeping well. At times, thinks of how things would be if she was not around. But, denies suicidal ideation. When feeling anxious, is short of breath.  Review of Systems  Constitutional: Positive for fatigue. Negative for fever, chills, appetite change and unexpected weight change.  Eyes: Negative for visual disturbance.  Respiratory: Positive for shortness of breath.   Cardiovascular: Positive for chest pain and palpitations. Negative for leg swelling.  Gastrointestinal: Negative for abdominal pain.  Skin: Negative for color change and rash.  Hematological: Negative for adenopathy. Does not bruise/bleed easily.  Psychiatric/Behavioral: Positive for sleep disturbance and dysphoric mood. Negative for suicidal ideas. The patient is nervous/anxious.        Objective:    BP 98/58  Pulse 86  Temp(Src) 98.2 F (36.8 C) (Oral)  Ht 5\' 1"  (1.549 m)  Wt 138 lb 12 oz (62.937 kg)  BMI 26.23 kg/m2  SpO2 95% Physical Exam  Constitutional: She is oriented to person, place, and time. She appears well-developed and well-nourished. No distress.  HENT:  Head: Normocephalic and atraumatic.  Right Ear: External ear normal.  Left Ear: External ear normal.  Nose: Nose normal.  Mouth/Throat: Oropharynx is clear and moist. No oropharyngeal exudate.  Eyes: Conjunctivae are normal. Pupils are equal, round, and reactive to light. Right eye exhibits no discharge. Left eye exhibits no discharge. No scleral icterus.  Neck: Normal range of motion. Neck supple. No tracheal deviation present. No thyromegaly present.  Cardiovascular: Normal rate, regular rhythm, normal heart  sounds and intact distal pulses.  Exam reveals no gallop and no friction rub.   No murmur heard. Pulmonary/Chest: Effort normal and breath sounds normal. No accessory muscle usage. Not tachypneic. No respiratory distress. She has no decreased breath sounds. She has no wheezes. She has no rhonchi. She has no rales. She exhibits no tenderness.  Musculoskeletal: Normal range of motion. She exhibits no edema and no tenderness.  Lymphadenopathy:    She has no cervical adenopathy.  Neurological: She is alert and oriented to person, place, and time. No cranial nerve deficit. She exhibits normal muscle tone. Coordination normal.  Skin: Skin is warm and dry. No rash noted. She is not diaphoretic. No erythema. No pallor.  Psychiatric: Her speech is normal and behavior is normal. Judgment and thought content normal. Her mood appears anxious. Cognition and memory are normal. She exhibits a depressed mood. She expresses no suicidal ideation. She expresses no suicidal plans.          Assessment & Plan:   Problem List Items Addressed This Visit     Unprioritized   Anxiety - Primary     Recent worsening symptoms of anxiety and depressed mood with stressors at home. Offered support today. Will continue Fluoxetine and add Effexor 37.5mg  daily. Will also add prn Lorazepam for episodes of severe anxiety. Discussed potential risks and side effects of these medications. Follow up in 2-4 weeks and as needed.  Over 72min of which >50% spent in face-to-face contact with patient discussing plan of care     Relevant Medications      LORazepam (ATIVAN) tablet      venlafaxine (  EFFEXOR-XR) 24 hr capsule       Return in about 4 weeks (around 03/24/2014).

## 2014-02-24 NOTE — Progress Notes (Signed)
Pre visit review using our clinic review tool, if applicable. No additional management support is needed unless otherwise documented below in the visit note. 

## 2014-02-24 NOTE — Patient Instructions (Signed)
Start Effexor 37.5mg  daily in the morning.  Take Lorazepam 0.5mg  up to twice daily as needed for severe anxiety.  Follow up in 4 weeks.

## 2014-02-24 NOTE — Assessment & Plan Note (Signed)
Recent worsening symptoms of anxiety and depressed mood with stressors at home. Offered support today. Will continue Fluoxetine and add Effexor 37.5mg  daily. Will also add prn Lorazepam for episodes of severe anxiety. Discussed potential risks and side effects of these medications. Follow up in 2-4 weeks and as needed.  Over 55min of which >50% spent in face-to-face contact with patient discussing plan of care

## 2014-02-27 DIAGNOSIS — J45909 Unspecified asthma, uncomplicated: Secondary | ICD-10-CM | POA: Diagnosis not present

## 2014-03-10 ENCOUNTER — Telehealth: Payer: Self-pay | Admitting: *Deleted

## 2014-03-10 NOTE — Telephone Encounter (Signed)
Fine to call in #30 tablets, as 30 tabs will likely be same price as 7

## 2014-03-10 NOTE — Telephone Encounter (Signed)
Pt left message requesting a weeks (5-7 tablets) worth of Trazodone to be sent to [facility], she is waiting for her mail order to arrive.

## 2014-03-11 ENCOUNTER — Other Ambulatory Visit: Payer: Self-pay | Admitting: *Deleted

## 2014-03-11 MED ORDER — TRAZODONE HCL 50 MG PO TABS: 50.0000 mg | ORAL_TABLET | Freq: Every day | ORAL | Status: DC

## 2014-03-11 NOTE — Telephone Encounter (Signed)
Pt aware.

## 2014-03-11 NOTE — Telephone Encounter (Signed)
Rx sent to pharmacy   

## 2014-03-18 ENCOUNTER — Ambulatory Visit: Payer: Medicare Other | Admitting: Internal Medicine

## 2014-03-19 ENCOUNTER — Encounter: Payer: Self-pay | Admitting: Internal Medicine

## 2014-03-19 ENCOUNTER — Ambulatory Visit (INDEPENDENT_AMBULATORY_CARE_PROVIDER_SITE_OTHER): Payer: Medicare Other | Admitting: Internal Medicine

## 2014-03-19 VITALS — BP 100/58 | HR 91 | Temp 97.6°F | Resp 12 | Wt 135.8 lb

## 2014-03-19 DIAGNOSIS — B373 Candidiasis of vulva and vagina: Secondary | ICD-10-CM

## 2014-03-19 DIAGNOSIS — B3731 Acute candidiasis of vulva and vagina: Secondary | ICD-10-CM

## 2014-03-19 DIAGNOSIS — E119 Type 2 diabetes mellitus without complications: Secondary | ICD-10-CM | POA: Diagnosis not present

## 2014-03-19 LAB — HEMOGLOBIN A1C: HEMOGLOBIN A1C: 7.8 % — AB (ref 4.6–6.5)

## 2014-03-19 MED ORDER — FLUCONAZOLE 150 MG PO TABS: 150.0000 mg | ORAL_TABLET | Freq: Once | ORAL | Status: DC

## 2014-03-19 NOTE — Progress Notes (Signed)
Patient ID: Haley Young, female   DOB: July 28, 1950, 63 y.o.   MRN: [id number]  HPI: Haley Young is a 63 y.o.-year-old female, returning for f/u for DM2, dx 2010, non-insulin-dependent, uncontrolled, without complications. Last visit 1.5 mo ago.  She has an yeast infection.  Last hemoglobin A1c was: Lab Results  Component Value Date   HGBA1C 8.8* 11/27/2013   HGBA1C 8.8* 07/25/2013   HGBA1C 8.6* 06/28/2012   Pt is on a regimen of: - Metformin 1000 mg po bid >> tolerates it well She was on Invokana 300 mg daily - added 08/2013  - costs her 300$ >> at last visit I gave her a coupon for InvokaMet 2 tablets in am ([phone] mg) daily in am and continued Metformin 1000 mg with dinner. She tells she can get the 3 mo supply for 207%$ Tried insulin Lantus and Novolog - ~2011 Tried Cinnamon and vinegar.  Pt checks her sugars 2x a day: - am: 141-172 (214) >> 125-176 >> 136-177 >> 110-141 >> 88-130s - 2h after b'fast: n/c - before lunch: n/c - 2h after lunch: n/c >> 78, 137 - before dinner: 140-150s >> n/c >> 103, 120 - 2h after dinner: n/c >> 154-197 >> n/c >> 129-154 (186, very few 200s after a sweet meal) - bedtime: 152-189 >> 140-150 >> 114-160, few spikes in the 180s, 1x 202 >> 126-170, few >190-220 - nighttime: n/c >> 138-240, most 138-170 >> n/c Unclear how high or how low she can get or if she has hypoglycemia awareness.   She started to change her diet. Pt's meals are: - Breakfast: banana + PB or cereals; coffee + sweetener. - Lunch: yoghurt + peaches, ham sandwich; soup - Dinner: baked chicken; peas; salad Less sweets; more fruit. Does not drink soft drinks.  She went to the [facility] class right after dx.  She exercises 2-3x a week by walking - 30 min.   - no CKD, last BUN/creatinine:  Lab Results  Component Value Date   BUN 23 09/13/2013   CREATININE 0.9 09/13/2013   - last set of lipids: Lab Results  Component Value Date   CHOL 147 09/13/2013   HDL 35.90*  09/13/2013   LDLCALC 92 09/13/2013   LDLDIRECT 171.3 10/31/2011   TRIG 94.0 09/13/2013   CHOLHDL 4 09/13/2013  On Lipitor. - last eye exam was in 09/2012. No DR.  - no numbness and tingling in her feet.  She saw cardiology at [facility] (Dr. Leonides Schanz) for fatigue and SOB >> EKG stress test >> several presumed blockages. She had a catheterization >> no blockages. 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 She also has hypothyroidism. Last TSH: Lab Results  Component Value Date   TSH 3.06 09/13/2013   I reviewed pt's medications, allergies, PMH, social hx, family hx and no changes required, except as mentioned above.  ROS: .Constitutional:+weight loss, no fatigue, no subjective hyperthermia/hypothermia Eyes: no blurry vision, no xerophthalmia ENT: no sore throat, no nodules palpated in throat, no dysphagia/odynophagia, no hoarseness Cardiovascular: no CP/SOB/no palpitations/leg swelling Respiratory: no cough/SOB Gastrointestinal: no N/V/D/+ C Musculoskeletal: no muscle/joint aches Skin: no rashes  PE: BP 100/58  Pulse 91  Temp(Src) 97.6 F (36.4 C) (Oral)  Resp 12  Wt 135 lb 12.8 oz (61.598 kg)  SpO2 95% Body mass index is 25.67 kg/(m^2).  Wt Readings from Last 3 Encounters:  03/19/14 135 lb 12.8 oz (61.598 kg)  02/24/14 138 lb 12 oz (62.937 kg)  02/03/14  138 lb (62.596 kg)   Constitutional: overweight, in NAD Eyes: PERRLA, EOMI, no exophthalmos ENT: moist mucous membranes, no thyromegaly, no cervical lymphadenopathy Cardiovascular: RRR, + 3/6 SEM, no RG Respiratory: CTA B Gastrointestinal: abdomen soft, NT, ND, BS+ Musculoskeletal: no deformities, strength intact in all 4 Skin: moist, warm, no rashes Neurological: no tremor with outstretched hands, DTR normal in all 4  ASSESSMENT: 1. DM2, non-insulin-dependent, uncontrolled, without complications  2. Yeast vaginitis  PLAN:  1. Patient with uncontrolled diabetes, on oral antidiabetic regimen, with improved control after  addition of Invokana and increased to 300 mg. She now got a discount for the InvokaMet >> will continue current regimen. - At last visit, we got a HbA1c at 8.8%, which is higher than I would expect from her sugar log; we will check a fructosamine level  - We discussed about options for treatment, and I suggested to:  Patient Instructions  Please continue InvokaMet 2 tablets in am ([phone] mg) daily in am.  Continue Metformin 1000 mg with dinner. Please return in 3 months with your sugar log.  - continue checking sugars at different times of the day - check 2 times a day, rotating checks - needs new eye exam >> advised to schedule a new one - will check a1c today - Return to clinic in 3 mo with sugar log  2. Yeast vaginitis - will send diflucan 150 mg to pharmacy - advised her to switch to [place] liners - it will be problematic to continue Invokana if she continues to have yeast infections...  Office Visit on 03/19/2014  Component Date Value Ref Range Status  . Hemoglobin A1C 03/19/2014 7.8* 4.6 - 6.5 % Final   Glycemic Control Guidelines for People with Diabetes:Non Diabetic:  <6%Goal of Therapy: <7%Additional Action Suggested:  >8%   . Fructosamine 03/19/2014 292* 190 - 270 umol/L Final    HbA1c improved to 7.8%, but the HbA1c as calculated from the fructosamine is much lower, at 6.57%!

## 2014-03-19 NOTE — Patient Instructions (Signed)
Please continue InvokaMet 2 tablets in am [phone]) daily in am.  Continue Metformin 1000 mg with dinner.  Please return in 3 months with your sugar log.  Please stop at the lab.

## 2014-03-22 LAB — FRUCTOSAMINE: Fructosamine: 292 umol/L — ABNORMAL HIGH (ref 190–270)

## 2014-03-24 ENCOUNTER — Encounter: Payer: Self-pay | Admitting: *Deleted

## 2014-04-09 ENCOUNTER — Other Ambulatory Visit: Payer: Self-pay | Admitting: Internal Medicine

## 2014-05-16 DIAGNOSIS — I371 Nonrheumatic pulmonary valve insufficiency: Secondary | ICD-10-CM | POA: Diagnosis not present

## 2014-05-16 DIAGNOSIS — E782 Mixed hyperlipidemia: Secondary | ICD-10-CM | POA: Diagnosis not present

## 2014-05-16 DIAGNOSIS — Q221 Congenital pulmonary valve stenosis: Secondary | ICD-10-CM | POA: Diagnosis not present

## 2014-07-28 ENCOUNTER — Telehealth: Payer: Self-pay | Admitting: *Deleted

## 2014-07-28 NOTE — Telephone Encounter (Signed)
Unable to contact pt as her VM is full.  However I spoke with the pharmacy and pt has one refill left on Lorazepam.  Rx is being filled and pt may pick it up at [place].

## 2014-07-28 NOTE — Telephone Encounter (Signed)
Pt called states her brother passed away on 2022/10/27, she is requesting a Xanax prescription to be called in.  Please advise

## 2014-07-28 NOTE — Telephone Encounter (Signed)
She already takes Lorazepam, which is similar to Xanax. I would recommend using the Lorazepam if needed. We can refill if she is out.

## 2014-07-30 NOTE — Telephone Encounter (Signed)
Spoke to patient, notified Lorazepam available at pharmacy and to call back with any problems.

## 2014-08-08 ENCOUNTER — Ambulatory Visit (INDEPENDENT_AMBULATORY_CARE_PROVIDER_SITE_OTHER): Payer: Medicare Other | Admitting: Internal Medicine

## 2014-08-08 ENCOUNTER — Encounter: Payer: Self-pay | Admitting: Internal Medicine

## 2014-08-08 VITALS — BP 115/65 | HR 80 | Temp 98.0°F | Ht 61.0 in | Wt 143.5 lb

## 2014-08-08 DIAGNOSIS — F4323 Adjustment disorder with mixed anxiety and depressed mood: Secondary | ICD-10-CM

## 2014-08-08 DIAGNOSIS — E119 Type 2 diabetes mellitus without complications: Secondary | ICD-10-CM

## 2014-08-08 DIAGNOSIS — G4700 Insomnia, unspecified: Secondary | ICD-10-CM | POA: Diagnosis not present

## 2014-08-08 DIAGNOSIS — F418 Other specified anxiety disorders: Secondary | ICD-10-CM | POA: Insufficient documentation

## 2014-08-08 MED ORDER — TRAZODONE HCL 100 MG PO TABS: 100.0000 mg | ORAL_TABLET | Freq: Every day | ORAL | Status: DC

## 2014-08-08 NOTE — Assessment & Plan Note (Signed)
Recent insomnia, exacerbated after death of her son. Offered support today. She is practicing good sleep hygiene. Will Increase Trazodone to 100mg  daily. Will use Lorazpeam as needed during night if she has early waking. Consider increase in Fluoxetine if symptoms not improving. Follow up in 4 weeks.

## 2014-08-08 NOTE — Progress Notes (Signed)
Pre visit review using our clinic review tool, if applicable. No additional management support is needed unless otherwise documented below in the visit note. 

## 2014-08-08 NOTE — Assessment & Plan Note (Signed)
Recent elevated BG per report. Will plan to check A1c prior to next visit. She has been out of Invokana, however has ordered some from her [facility].

## 2014-08-08 NOTE — Progress Notes (Signed)
Subjective:    Patient ID: Haley Young, female    DOB: [birth year], 64 y.o.   MRN: [id number]  HPI  64YO female presents for follow up.  Insomnia - Son recently died. Having trouble sleeping. Wakes at 3am most mornings, mind racing. Unable to go back to sleep. No improvement with nighttime Trazodone. Has strong support from family and friends. Feels that she is coping okay overall. Taking Fluoxetine.  DM - has been out of medication because unable afford. Waiting on supply from Haley Young.   Past medical, surgical, family and social history per today's encounter.  Review of Systems  Constitutional: Positive for fatigue. Negative for fever, chills, appetite change and unexpected weight change.  Eyes: Negative for visual disturbance.  Respiratory: Negative for shortness of breath.   Cardiovascular: Negative for chest pain and leg swelling.  Gastrointestinal: Negative for abdominal pain.  Skin: Negative for color change and rash.  Hematological: Negative for adenopathy. Does not bruise/bleed easily.  Psychiatric/Behavioral: Positive for sleep disturbance and dysphoric mood. Negative for suicidal ideas. The patient is not nervous/anxious.        Objective:    BP 115/65 mmHg  Pulse 80  Temp(Src) 98 F (36.7 C) (Oral)  Ht 5\' 1"  (1.549 m)  Wt 143 lb 8 oz (65.091 kg)  BMI 27.13 kg/m2  SpO2 98% Physical Exam  Constitutional: She is oriented to person, place, and time. She appears well-developed and well-nourished. No distress.  HENT:  Head: Normocephalic and atraumatic.  Right Ear: External ear normal.  Left Ear: External ear normal.  Nose: Nose normal.  Mouth/Throat: Oropharynx is clear and moist. No oropharyngeal exudate.  Eyes: Conjunctivae are normal. Pupils are equal, round, and reactive to light. Right eye exhibits no discharge. Left eye exhibits no discharge. No scleral icterus.  Neck: Normal range of motion. Neck supple. No tracheal deviation present. No  thyromegaly present.  Cardiovascular: Normal rate, regular rhythm, normal heart sounds and intact distal pulses.  Exam reveals no gallop and no friction rub.   No murmur heard. Pulmonary/Chest: Effort normal and breath sounds normal. No accessory muscle usage. No tachypnea. No respiratory distress. She has no decreased breath sounds. She has no wheezes. She has no rhonchi. She has no rales. She exhibits no tenderness.  Musculoskeletal: Normal range of motion. She exhibits no edema or tenderness.  Lymphadenopathy:    She has no cervical adenopathy.  Neurological: She is alert and oriented to person, place, and time. No cranial nerve deficit. She exhibits normal muscle tone. Coordination normal.  Skin: Skin is warm and dry. No rash noted. She is not diaphoretic. No erythema. No pallor.  Psychiatric: Her speech is normal and behavior is normal. Judgment and thought content normal. Cognition and memory are normal. She exhibits a depressed mood.          Assessment & Plan:   Problem List Items Addressed This Visit      Unprioritized   Adjustment disorder with mixed anxiety and depressed mood    Offered support today given recent death of her son. She has not been using Effexor. Will continue Fluoxetine. Monitor for improvement in symptoms over next few weeks with improved sleep. Consider increase in Fluoxetine to 60mg  daily if no improvement.      Diabetes mellitus type 2, controlled    Recent elevated BG per report. Will plan to check A1c prior to next visit. She has been out of Invokana, however has ordered some from her [facility].  Insomnia - Primary    Recent insomnia, exacerbated after death of her son. Offered support today. She is practicing good sleep hygiene. Will Increase Trazodone to 100mg  daily. Will use Lorazpeam as needed during night if she has early waking. Consider increase in Fluoxetine if symptoms not improving. Follow up in 4 weeks.          Return in  about 4 weeks (around 09/05/2014) for Recheck of Diabetes.

## 2014-08-08 NOTE — Assessment & Plan Note (Signed)
Offered support today given recent death of her son. She has not been using Effexor. Will continue Fluoxetine. Monitor for improvement in symptoms over next few weeks with improved sleep. Consider increase in Fluoxetine to 60mg  daily if no improvement.

## 2014-08-08 NOTE — Patient Instructions (Addendum)
Increase Trazodone to 100mg  daily at bedtime.  Use Lorazepam 0.5mg  if wake during the night and unable to sleep.  Follow up in 4 weeks.  Labs prior to follow up.

## 2014-08-19 ENCOUNTER — Other Ambulatory Visit: Payer: Self-pay | Admitting: *Deleted

## 2014-08-19 DIAGNOSIS — F411 Generalized anxiety disorder: Secondary | ICD-10-CM

## 2014-08-19 MED ORDER — FLUOXETINE HCL 40 MG PO CAPS: 40.0000 mg | ORAL_CAPSULE | Freq: Every day | ORAL | Status: DC

## 2014-09-02 DIAGNOSIS — R0602 Shortness of breath: Secondary | ICD-10-CM | POA: Diagnosis not present

## 2014-09-09 ENCOUNTER — Encounter: Payer: Self-pay | Admitting: Internal Medicine

## 2014-09-09 ENCOUNTER — Ambulatory Visit (INDEPENDENT_AMBULATORY_CARE_PROVIDER_SITE_OTHER): Payer: Medicare Other | Admitting: Internal Medicine

## 2014-09-09 VITALS — BP 101/69 | HR 89 | Temp 97.5°F | Ht 61.0 in | Wt 138.2 lb

## 2014-09-09 DIAGNOSIS — F4323 Adjustment disorder with mixed anxiety and depressed mood: Secondary | ICD-10-CM | POA: Diagnosis not present

## 2014-09-09 DIAGNOSIS — E039 Hypothyroidism, unspecified: Secondary | ICD-10-CM

## 2014-09-09 DIAGNOSIS — E119 Type 2 diabetes mellitus without complications: Secondary | ICD-10-CM | POA: Diagnosis not present

## 2014-09-09 LAB — COMPREHENSIVE METABOLIC PANEL
ALBUMIN: 4.5 g/dL (ref 3.5–5.2)
ALT: 14 U/L (ref 0–35)
AST: 24 U/L (ref 0–37)
Alkaline Phosphatase: 69 U/L (ref 39–117)
BUN: 27 mg/dL — ABNORMAL HIGH (ref 6–23)
CO2: 25 mEq/L (ref 19–32)
Calcium: 10.7 mg/dL — ABNORMAL HIGH (ref 8.4–10.5)
Chloride: 98 mEq/L (ref 96–112)
Creatinine, Ser: 0.97 mg/dL (ref 0.40–1.20)
GFR: 61.04 mL/min (ref >60.00)
Glucose, Bld: 213 mg/dL — ABNORMAL HIGH (ref 70–99)
POTASSIUM: 5.8 meq/L — AB (ref 3.5–5.1)
SODIUM: 132 meq/L — AB (ref 135–145)
Total Bilirubin: 0.5 mg/dL (ref 0.2–1.2)
Total Protein: 7.8 g/dL (ref 6.0–8.3)

## 2014-09-09 LAB — LIPID PANEL
CHOL/HDL RATIO: 4
Cholesterol: 172 mg/dL (ref 0–200)
HDL: 45 mg/dL (ref >39.00)
LDL Cholesterol: 105 mg/dL — ABNORMAL HIGH (ref 0–99)
NonHDL: 127
Triglycerides: 112 mg/dL (ref 0.0–149.0)
VLDL: 22.4 mg/dL (ref 0.0–40.0)

## 2014-09-09 LAB — MICROALBUMIN / CREATININE URINE RATIO
CREATININE, U: 46.4 mg/dL
MICROALB UR: 0.7 mg/dL (ref 0.0–1.9)
Microalb Creat Ratio: 1.5 mg/g (ref 0.0–30.0)

## 2014-09-09 LAB — HEMOGLOBIN A1C: Hgb A1c MFr Bld: 7.9 % — ABNORMAL HIGH (ref 4.6–6.5)

## 2014-09-09 LAB — TSH: TSH: 7.08 u[IU]/mL — AB (ref 0.35–4.50)

## 2014-09-09 MED ORDER — GLUCOSE BLOOD VI STRP: ORAL_STRIP | Status: DC

## 2014-09-09 MED ORDER — LORAZEPAM 1 MG PO TABS: 1.0000 mg | ORAL_TABLET | Freq: Two times a day (BID) | ORAL | Status: DC | PRN

## 2014-09-09 NOTE — Assessment & Plan Note (Signed)
Symptoms recently worsened after death of her guardian son. Encouraged counseling through hospice. Will increase Lorazepam to 1mg  bid prn. Follow up 4 weeks.

## 2014-09-09 NOTE — Patient Instructions (Signed)
Labs today.  Try increasing Lorazepam to 1mg  twice daily as needed for anxiety.   Follow up in 4 weeks.

## 2014-09-09 NOTE — Progress Notes (Signed)
Pre visit review using our clinic review tool, if applicable. No additional management support is needed unless otherwise documented below in the visit note. 

## 2014-09-09 NOTE — Progress Notes (Signed)
Subjective:    Patient ID: Haley Young, female    DOB: 02-14-1951, 64 y.o.   MRN: [id number]  HPI 64YO female presents for follow up.  DM - Was out of medication x 1 month. Now back on medication. BG typically near 100 fasting, some near 130 in the evening.  Recently lost her guardian son. Seeking counseling through Hospice. Having more anxiety and depressed mood. Lorazepam is not helping with anxiety.    Past medical, surgical, family and social history per today's encounter.  Review of Systems  Constitutional: Negative for fever, chills, appetite change, fatigue and unexpected weight change.  Eyes: Negative for visual disturbance.  Respiratory: Negative for shortness of breath.   Cardiovascular: Negative for chest pain and leg swelling.  Gastrointestinal: Negative for abdominal pain, diarrhea and constipation.  Skin: Negative for color change and rash.  Hematological: Negative for adenopathy. Does not bruise/bleed easily.  Psychiatric/Behavioral: Positive for sleep disturbance and dysphoric mood. Negative for suicidal ideas. The patient is nervous/anxious.        Objective:    BP 101/69 mmHg  Pulse 89  Temp(Src) 97.5 F (36.4 C) (Oral)  Ht 5\' 1"  (1.549 m)  Wt 138 lb 4 oz (62.71 kg)  BMI 26.14 kg/m2  SpO2 98% Physical Exam  Constitutional: She is oriented to person, place, and time. She appears well-developed and well-nourished. No distress.  HENT:  Head: Normocephalic and atraumatic.  Right Ear: External ear normal.  Left Ear: External ear normal.  Nose: Nose normal.  Mouth/Throat: Oropharynx is clear and moist. No oropharyngeal exudate.  Eyes: Conjunctivae are normal. Pupils are equal, round, and reactive to light. Right eye exhibits no discharge. Left eye exhibits no discharge. No scleral icterus.  Neck: Normal range of motion. Neck supple. No tracheal deviation present. No thyromegaly present.  Cardiovascular: Normal rate, regular rhythm, normal heart sounds and  intact distal pulses.  Exam reveals no gallop and no friction rub.   No murmur heard. Pulmonary/Chest: Effort normal and breath sounds normal. No respiratory distress. She has no wheezes. She has no rales. She exhibits no tenderness.  Musculoskeletal: Normal range of motion. She exhibits no edema or tenderness.  Lymphadenopathy:    She has no cervical adenopathy.  Neurological: She is alert and oriented to person, place, and time. No cranial nerve deficit. She exhibits normal muscle tone. Coordination normal.  Skin: Skin is warm and dry. No rash noted. She is not diaphoretic. No erythema. No pallor.  Psychiatric: Her speech is normal and behavior is normal. Judgment and thought content normal. She exhibits a depressed mood. She expresses no suicidal ideation.          Assessment & Plan:   Problem List Items Addressed This Visit      Unprioritized   Adjustment disorder with mixed anxiety and depressed mood    Symptoms recently worsened after death of her guardian son. Encouraged counseling through hospice. Will increase Lorazepam to 1mg  bid prn. Follow up 4 weeks.      Relevant Medications   LORazepam (ATIVAN) tablet   Diabetes mellitus type 2, controlled - Primary    Check A1c with labs today. Continue current medications.      Relevant Medications   metFORMIN (GLUCOPHAGE) 1000 MG tablet   Other Relevant Orders   Comprehensive metabolic panel   Hemoglobin A1c   Lipid panel   Microalbumin / creatinine urine ratio   Hypothyroidism    Will check TSH with labs today. Continue Levothyroxine.  Relevant Orders   TSH       Return in about 4 weeks (around 10/07/2014) for Recheck.

## 2014-09-09 NOTE — Assessment & Plan Note (Signed)
Check A1c with labs today. Continue current medications.

## 2014-09-09 NOTE — Assessment & Plan Note (Signed)
Will check TSH with labs today. Continue Levothyroxine. 

## 2014-09-10 ENCOUNTER — Telehealth: Payer: Self-pay | Admitting: *Deleted

## 2014-09-10 ENCOUNTER — Other Ambulatory Visit: Payer: Medicare Other

## 2014-09-10 ENCOUNTER — Other Ambulatory Visit (INDEPENDENT_AMBULATORY_CARE_PROVIDER_SITE_OTHER): Payer: Medicare Other

## 2014-09-10 ENCOUNTER — Encounter: Payer: Self-pay | Admitting: Internal Medicine

## 2014-09-10 ENCOUNTER — Ambulatory Visit (INDEPENDENT_AMBULATORY_CARE_PROVIDER_SITE_OTHER): Payer: Medicare Other | Admitting: Internal Medicine

## 2014-09-10 VITALS — BP 104/62 | HR 83 | Temp 97.8°F | Resp 12 | Wt 138.8 lb

## 2014-09-10 DIAGNOSIS — E875 Hyperkalemia: Secondary | ICD-10-CM | POA: Diagnosis not present

## 2014-09-10 DIAGNOSIS — E119 Type 2 diabetes mellitus without complications: Secondary | ICD-10-CM

## 2014-09-10 LAB — BASIC METABOLIC PANEL
BUN: 25 mg/dL — ABNORMAL HIGH (ref 6–23)
CHLORIDE: 96 meq/L (ref 96–112)
CO2: 25 mEq/L (ref 19–32)
CREATININE: 0.96 mg/dL (ref 0.40–1.20)
Calcium: 10.5 mg/dL (ref 8.4–10.5)
GFR: 61.78 mL/min (ref >60.00)
Glucose, Bld: 169 mg/dL — ABNORMAL HIGH (ref 70–99)
POTASSIUM: 5.1 meq/L (ref 3.5–5.1)
Sodium: 131 mEq/L — ABNORMAL LOW (ref 135–145)

## 2014-09-10 MED ORDER — GLIPIZIDE 5 MG PO TABS: 5.0000 mg | ORAL_TABLET | Freq: Two times a day (BID) | ORAL | Status: DC

## 2014-09-10 NOTE — Progress Notes (Signed)
Patient ID: Haley Young, female   DOB: 1951-01-20, 64 y.o.   MRN: [id number]  HPI: Haley Young is a 64 y.o.-year-old female, returning for f/u for DM2, dx 2010, non-insulin-dependent, uncontrolled, without complications. Last visit 5 mo ago.  Her brother died of PNA 08/31/2014.  Last hemoglobin A1c was: !!! 03/19/2014:  HbA1c calculated from fructosamine is much lower, at 6.57% Lab Results  Component Value Date   HGBA1C 7.9* 09/09/2014   HGBA1C 7.8* 03/19/2014   HGBA1C 8.8* 11/27/2013   Pt is on a regimen of: - Metformin 1000 mg po bid >> tolerates it well - Invokana 300 mg daily >> stopped yesterday by PCP 2/2 high potassium She was on Invokana 300 mg daily - added 08/2013  - costs her 300$ >> at last visit I gave her a coupon for InvokaMet 2 tablets in am ([phone] mg) daily in am and continued Metformin 1000 mg with dinner. She tells she can get the 3 mo supply for 207%$ Tried insulin Lantus and Novolog - ~2011 Tried Cinnamon and vinegar.  Pt did not check sugars lately. Reviewed sugars since last visit: - am: 141-172 (214) >> 125-176 >> 136-177 >> 110-141 >> 88-130s >> 100-130 - 2h after b'fast: n/c - before lunch: n/c - 2h after lunch: n/c >> 78, 137 >> n/c - before dinner: 140-150s >> n/c >> 103, 120 >> n/c - 2h after dinner: n/c >> 154-197 >> n/c >> 129-154 (186, very few 200s after a sweet meal) >> n/c - bedtime: 152-189 >> 140-150 >> 114-160, few spikes in the 180s, 1x 202 >> 126-170, few >190-220 >> n/c - nighttime: n/c >> 138-240, most 138-170 >> n/c Unclear how high or how low she can get or if she has hypoglycemia awareness.   She started to change her diet. Pt's meals are: - Breakfast: banana + PB or cereals; coffee + sweetener. - Lunch: yoghurt + peaches, ham sandwich; soup - Dinner: baked chicken; peas; salad Less sweets; more fruit. Does not drink soft drinks.   She went to the [facility] class right after dx.  She exercises 2-3x a week by  walking - 30 min.   - no CKD, last BUN/creatinine:  Lab Results  Component Value Date   BUN 27* 09/09/2014   CREATININE 0.97 09/09/2014   - last set of lipids: Lab Results  Component Value Date   CHOL 172 09/09/2014   HDL 45.00 09/09/2014   LDLCALC 105* 09/09/2014   LDLDIRECT 171.3 10/31/2011   TRIG 112.0 09/09/2014   CHOLHDL 4 09/09/2014  On Lipitor. - last eye exam was in 09/2012. No DR.  - no numbness and tingling in her feet.  She saw cardiology at [facility] (Dr. Leonides Schanz) for fatigue and SOB >> EKG stress test >> several presumed blockages. She had a catheterization >> no blockages. 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   She also has hypothyroidism. Last TSH: Lab Results  Component Value Date   TSH 7.08* 09/09/2014   She admits that she skipped doses of LT4 before last check.  I reviewed pt's medications, allergies, PMH, social hx, family hx, and changes were documented in the history of present illness. Otherwise, unchanged from my initial visit note. She started Ativan.  ROS: .Constitutional:+weight loss, no fatigue, no subjective hyperthermia/hypothermia Eyes: no blurry vision, no xerophthalmia ENT: no sore throat, no nodules palpated in throat, no dysphagia/odynophagia, no hoarseness Cardiovascular: no CP/SOB/no palpitations/leg swelling Respiratory: no cough/SOB Gastrointestinal: no N/V/D/C  Musculoskeletal: no muscle/joint aches Skin: no rashes  PE: BP 104/62 mmHg  Pulse 83  Temp(Src) 97.8 F (36.6 C) (Oral)  Resp 12  Wt 138 lb 12.8 oz (62.959 kg)  SpO2 94% Body mass index is 26.24 kg/(m^2).  Wt Readings from Last 3 Encounters:  09/10/14 138 lb 12.8 oz (62.959 kg)  09/09/14 138 lb 4 oz (62.71 kg)  08/08/14 143 lb 8 oz (65.091 kg)   Constitutional: overweight, in NAD Eyes: PERRLA, EOMI, no exophthalmos ENT: moist mucous membranes, no thyromegaly, no cervical lymphadenopathy Cardiovascular: RRR, + 3/6 SEM, no RG Respiratory: CTA  B Gastrointestinal: abdomen soft, NT, ND, BS+ Musculoskeletal: no deformities, strength intact in all 4 Skin: moist, warm, no rashes Neurological: no tremor with outstretched hands, DTR normal in all 4  ASSESSMENT: 1. DM2, non-insulin-dependent, uncontrolled, without complications  PLAN:  1. Patient with uncontrolled diabetes, on oral antidiabetic regimen, with improved control after addition of Invokana and increased to 300 mg. However, she had a potassium of 5.8 yesterday in PCP's office >> Invokana was stopped. We discussed that she needs to stay off it for now. I am hoping we can restart in the future, maybe at half dose. However, for now, she will continue Metformin but increase to 1000 mg bid and add Glipizide 5 mg bid. - We discussed about options for treatment, and I suggested to:  Patient Instructions  Please stay off InvokaMet.  Start taking Metformin 1000 mg 2x a day. Start Glipizide 5 mg 2x a day right before meals.  Please come back for a follow-up appointment in 1.5 months.  - continue checking sugars at different times of the day - check 2 times a day, rotating checks - not UTD with eye exam >> advised to schedule a new one - Return to clinic in 3 mo with sugar log   She is worried about her high HbA1c, but I explained that, at our last visit, in 03/2014, HbA1c improved to 7.8%, but the HbA1c calculated from the fructosamine was much lower, at 6.57%! She had another HbA1c checked yesterday, which was similar, and I will try to add a fructosamine level.   Fructosamine: 312 >> the HbA1c calculated from fructosamine is very good: 6.9%!

## 2014-09-10 NOTE — Telephone Encounter (Signed)
Pt left VM, states she saw Dr. Cruzita Lederer today and had medication changed. Also would like to see if Dr. Gilford Rile would change her rozac Rx. States she has been on it for many years and "is not sure it is helping", would like to try a change to something else. Please advise, pt just in 09/09/14

## 2014-09-10 NOTE — Telephone Encounter (Signed)
Labs and dx?  

## 2014-09-10 NOTE — Telephone Encounter (Signed)
Needs to have follow up to discuss changing medication

## 2014-09-10 NOTE — Telephone Encounter (Signed)
BMP for hyperkalemia 

## 2014-09-10 NOTE — Patient Instructions (Signed)
Please stay off InvokaMet.  Start taking Metformin 1000 mg 2x a day. Start Glipizide 5 mg 2x a day right before meals.  Please come back for a follow-up appointment in 1.5 months.

## 2014-09-11 NOTE — Telephone Encounter (Signed)
Notified pt. 

## 2014-09-15 ENCOUNTER — Other Ambulatory Visit: Payer: Self-pay | Admitting: *Deleted

## 2014-09-15 LAB — FRUCTOSAMINE: Fructosamine: 312 umol/L — ABNORMAL HIGH (ref 190–270)

## 2014-09-15 MED ORDER — GLUCOSE BLOOD VI STRP: ORAL_STRIP | Status: DC

## 2014-09-15 NOTE — Telephone Encounter (Signed)
Pt requested a refill of test strips. Done.

## 2014-09-30 ENCOUNTER — Telehealth: Payer: Self-pay | Admitting: Internal Medicine

## 2014-09-30 NOTE — Telephone Encounter (Signed)
Please read message below and advise.  

## 2014-09-30 NOTE — Telephone Encounter (Signed)
Patient called stating that the glipizide is causing her to shake   Please advise    Thank you

## 2014-09-30 NOTE — Telephone Encounter (Signed)
Called pt and advised her per Dr Arman Filter message. Pt understood. Will call in a couple of days to let us know how she is doing.

## 2014-09-30 NOTE — Telephone Encounter (Signed)
Are her sugars low when she shakes? Let's go ahead and cut it in half and take 1/2 tab 2x a day. Let me know how she does on this.

## 2014-10-06 ENCOUNTER — Telehealth: Payer: Self-pay | Admitting: Internal Medicine

## 2014-10-06 NOTE — Telephone Encounter (Signed)
Patient called stating that she is not feeling to well while taking the Glipizide She has been shaking real bad Mrs. Haley Young would like to discuss some options   Please advise patient   Thank you

## 2014-10-06 NOTE — Telephone Encounter (Signed)
Called pt and advised her per Dr Arman Filter message below. Pt understood. Will call back in a few days to let us know how her sugar readings are.

## 2014-10-06 NOTE — Telephone Encounter (Signed)
Pt also stated her sugars have been low. In the 60's. She had some crackers, cheese with water and her blood sugar was 76. Please read below and advise.

## 2014-10-06 NOTE — Telephone Encounter (Signed)
Let's stop the glipizide and see how her sugars are doing in the next few days - please call us back for further plan.

## 2014-10-07 ENCOUNTER — Ambulatory Visit (INDEPENDENT_AMBULATORY_CARE_PROVIDER_SITE_OTHER): Payer: Medicare Other | Admitting: Internal Medicine

## 2014-10-07 ENCOUNTER — Encounter: Payer: Self-pay | Admitting: Internal Medicine

## 2014-10-07 ENCOUNTER — Ambulatory Visit: Payer: Medicare Other | Admitting: Internal Medicine

## 2014-10-07 VITALS — BP 114/67 | HR 90 | Temp 98.4°F | Ht 61.0 in | Wt 152.0 lb

## 2014-10-07 DIAGNOSIS — E663 Overweight: Secondary | ICD-10-CM | POA: Diagnosis not present

## 2014-10-07 DIAGNOSIS — F4323 Adjustment disorder with mixed anxiety and depressed mood: Secondary | ICD-10-CM

## 2014-10-07 DIAGNOSIS — E119 Type 2 diabetes mellitus without complications: Secondary | ICD-10-CM | POA: Diagnosis not present

## 2014-10-07 MED ORDER — TRAZODONE HCL 100 MG PO TABS: 100.0000 mg | ORAL_TABLET | Freq: Every day | ORAL | Status: DC

## 2014-10-07 NOTE — Progress Notes (Signed)
Subjective:    Patient ID: Haley Young, female    DOB: [birth year], 64 y.o.   MRN: [id number]  HPI  64YO female presents for follow up.  Last seen 09/09/2014. Lorazepam dose was increased. Found to have hyperkalemia on Invokanamet. Tried to change back to glipizide, however BG were very low in 60s, so stopped medication. Now just taking Metformin. BG 106 fasting this morning. Plan to touch base with Endocrine this week.  Anxiety - Taking Lorazepam 1mg  at bedtime with some improvement. During day, will take Lorazepam if very anxious with some improvement.  Obesity - She is concerned about weight gain. Thinks that medication may have contributed. Trying to follow a healthy diet and stay active.   Wt Readings from Last 3 Encounters:  10/07/14 152 lb (68.947 kg)  09/10/14 138 lb 12.8 oz (62.959 kg)  09/09/14 138 lb 4 oz (62.71 kg)    Past medical, surgical, family and social history per today's encounter.  Review of Systems  Constitutional: Negative for fever, chills, appetite change, fatigue and unexpected weight change.  Eyes: Negative for visual disturbance.  Respiratory: Negative for shortness of breath.   Cardiovascular: Negative for chest pain and leg swelling.  Gastrointestinal: Negative for nausea, vomiting, abdominal pain, diarrhea and constipation.  Skin: Negative for color change and rash.  Hematological: Negative for adenopathy. Does not bruise/bleed easily.  Psychiatric/Behavioral: Positive for sleep disturbance. Negative for dysphoric mood. The patient is nervous/anxious.        Objective:    BP 114/67 mmHg  Pulse 90  Temp(Src) 98.4 F (36.9 C) (Oral)  Ht 5\' 1"  (1.549 m)  Wt 152 lb (68.947 kg)  BMI 28.74 kg/m2  SpO2 98% Physical Exam  Constitutional: She is oriented to person, place, and time. She appears well-developed and well-nourished. No distress.  HENT:  Head: Normocephalic and atraumatic.  Right Ear: External ear normal.  Left Ear: External ear  normal.  Nose: Nose normal.  Mouth/Throat: Oropharynx is clear and moist. No oropharyngeal exudate.  Eyes: Conjunctivae are normal. Pupils are equal, round, and reactive to light. Right eye exhibits no discharge. Left eye exhibits no discharge. No scleral icterus.  Neck: Normal range of motion. Neck supple. No tracheal deviation present. No thyromegaly present.  Cardiovascular: Normal rate, regular rhythm, normal heart sounds and intact distal pulses.  Exam reveals no gallop and no friction rub.   No murmur heard. Pulmonary/Chest: Effort normal and breath sounds normal. No respiratory distress. She has no wheezes. She has no rales. She exhibits no tenderness.  Musculoskeletal: Normal range of motion. She exhibits no edema or tenderness.  Lymphadenopathy:    She has no cervical adenopathy.  Neurological: She is alert and oriented to person, place, and time. No cranial nerve deficit. She exhibits normal muscle tone. Coordination normal.  Skin: Skin is warm and dry. No rash noted. She is not diaphoretic. No erythema. No pallor.  Psychiatric: She has a normal mood and affect. Her behavior is normal. Judgment and thought content normal.          Assessment & Plan:  Over 51min of which >50% spent in face-to-face contact with patient discussing plan of care  Problem List Items Addressed This Visit      Unprioritized   Adjustment disorder with mixed anxiety and depressed mood - Primary    Symptoms improved. Continue Fluoxetine. Using Lorazepam only prn. Will continue to monitor.       Diabetes mellitus type 2, controlled    BG appear  to be well controlled on Metformin alone. Developed hypoglycemia with Glipizide. Will continue metformin. Next A1c in 11/2014.      Overweight (BMI 25.0-29.9)    Wt Readings from Last 3 Encounters:  10/07/14 152 lb (68.947 kg)  09/10/14 138 lb 12.8 oz (62.959 kg)  09/09/14 138 lb 4 oz (62.71 kg)   Body mass index is 28.74 kg/(m^2). Recent weight gain of  14lbs. Possibly exacerbated by use of Glipizide, and she has now discontinued this medication. Encouraged healthy, low-glycemic diet and exercise. Follow up recheck in 3 months and prn.          Return in about 3 months (around 01/07/2015) for Recheck of Diabetes.

## 2014-10-07 NOTE — Assessment & Plan Note (Signed)
Wt Readings from Last 3 Encounters:  10/07/14 152 lb (68.947 kg)  09/10/14 138 lb 12.8 oz (62.959 kg)  09/09/14 138 lb 4 oz (62.71 kg)   Body mass index is 28.74 kg/(m^2). Recent weight gain of 14lbs. Possibly exacerbated by use of Glipizide, and she has now discontinued this medication. Encouraged healthy, low-glycemic diet and exercise. Follow up recheck in 3 months and prn.

## 2014-10-07 NOTE — Progress Notes (Signed)
Pre visit review using our clinic review tool, if applicable. No additional management support is needed unless otherwise documented below in the visit note. 

## 2014-10-07 NOTE — Assessment & Plan Note (Signed)
Symptoms improved. Continue Fluoxetine. Using Lorazepam only prn. Will continue to monitor.

## 2014-10-07 NOTE — Assessment & Plan Note (Signed)
BG appear to be well controlled on Metformin alone. Developed hypoglycemia with Glipizide. Will continue metformin. Next A1c in 11/2014.

## 2014-10-07 NOTE — Patient Instructions (Signed)
Follow up in 3 months or sooner as needed. 

## 2014-10-09 NOTE — Telephone Encounter (Signed)
error 

## 2014-10-13 ENCOUNTER — Encounter: Payer: Self-pay | Admitting: *Deleted

## 2014-10-13 DIAGNOSIS — E119 Type 2 diabetes mellitus without complications: Secondary | ICD-10-CM | POA: Diagnosis not present

## 2014-10-13 LAB — HM DIABETES EYE EXAM

## 2014-10-16 ENCOUNTER — Other Ambulatory Visit: Payer: Self-pay | Admitting: Internal Medicine

## 2014-10-23 ENCOUNTER — Ambulatory Visit
Admit: 2014-10-23 | Disposition: A | Discharge status: Home / Self Care | Payer: Self-pay | Attending: Internal Medicine | Admitting: Internal Medicine

## 2014-10-23 DIAGNOSIS — Z1231 Encounter for screening mammogram for malignant neoplasm of breast: Secondary | ICD-10-CM | POA: Diagnosis not present

## 2014-10-23 LAB — HM MAMMOGRAPHY: HM MAMMO: NEGATIVE

## 2014-10-27 ENCOUNTER — Ambulatory Visit (INDEPENDENT_AMBULATORY_CARE_PROVIDER_SITE_OTHER): Payer: Medicare Other | Admitting: Internal Medicine

## 2014-10-27 ENCOUNTER — Encounter: Payer: Self-pay | Admitting: Internal Medicine

## 2014-10-27 VITALS — BP 108/60 | HR 81 | Temp 98.4°F | Resp 12 | Wt 152.0 lb

## 2014-10-27 DIAGNOSIS — E039 Hypothyroidism, unspecified: Secondary | ICD-10-CM

## 2014-10-27 DIAGNOSIS — E119 Type 2 diabetes mellitus without complications: Secondary | ICD-10-CM | POA: Diagnosis not present

## 2014-10-27 MED ORDER — GLUCOSE BLOOD VI STRP: ORAL_STRIP | Status: DC

## 2014-10-27 NOTE — Progress Notes (Signed)
Patient ID: [name], female   DOB: 1951/01/02, 64 y.o.   MRN: [id number]  HPI: Haley Young is a 64 y.o.-year-old female, returning for f/u for DM2, dx 2010, non-insulin-dependent, uncontrolled, without complications. Last visit 1.5 mo ago.  She has tremors 2/2 Albuterol.  Last hemoglobin A1c was: 09/10/2014: HbA1c calculated from fructosamine is 6.9%! 03/19/2014:  HbA1c calculated from fructosamine is 6.57% Lab Results  Component Value Date   HGBA1C 7.9* 09/09/2014   HGBA1C 7.8* 03/19/2014   HGBA1C 8.8* 11/27/2013   Pt is on a regimen of: - Metformin 1000 mg po bid >> tolerates it well We started Glipizide as we had to stop Invokana >> tremors >> stopped She was on Invokana 300 mg daily >> stopped 2/2 high potassium She was on Invokana 300 mg daily - added 08/2013  - costs her 300$ >> at last visit I gave her a coupon for InvokaMet 2 tablets in am ([phone] mg) daily in am and continued Metformin 1000 mg with dinner. She tells she can get the 3 mo supply for 207%$ Tried insulin Lantus and Novolog - ~2011 Tried Cinnamon and vinegar.  Pt did not check sugars lately. Reviewed sugars before stopping Glipizide (no checks since): - am: 141-172 (214) >> 125-176 >> 136-177 >> 110-141 >> 88-130s >> 100-130 >> 80-115 - 2h after b'fast: n/c - before lunch: n/c >> 63-139 - 2h after lunch: n/c >> 78, 137 >> n/c >> 102-132 - before dinner: 140-150s >> n/c >> 103, 120 >> n/c>> 76-102 - 2h after dinner: 129-154 (186, very few 200s after a sweet meal) >> n/c >> 109 - bedtime: 114-160, few spikes in the 180s, 1x 202 >> 126-170, few >190-220 >> n/c >> 113-219 - nighttime: n/c >> 138-240, most 138-170 >> n/c Unclear how high or how low she can get or if she has hypoglycemia awareness.   She started to change her diet. Pt's meals are: - Breakfast: banana + PB or cereals; coffee + sweetener. - Lunch: yoghurt + peaches, ham sandwich; soup - Dinner: baked chicken; peas; salad Less sweets; more  fruit. Does not drink soft drinks.   She exercises 2-3x a week by walking - 30 min.   - no CKD, last BUN/creatinine:  Lab Results  Component Value Date   BUN 25* 09/10/2014   CREATININE 0.96 09/10/2014   - last set of lipids: Lab Results  Component Value Date   CHOL 172 09/09/2014   HDL 45.00 09/09/2014   LDLCALC 105* 09/09/2014   LDLDIRECT 171.3 10/31/2011   TRIG 112.0 09/09/2014   CHOLHDL 4 09/09/2014  On Lipitor. - last eye exam was in 10/13/2014. No DR.  - no numbness and tingling in her feet.  She saw cardiology at [facility] (Dr. Leonides Schanz) for fatigue and SOB >> EKG stress test >> several presumed blockages. She had a catheterization >> no blockages. 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  She will have a new cardiac MRI at [facility] soon.  She also has hypothyroidism. Last TSH: Lab Results  Component Value Date   TSH 7.08* 09/09/2014  She admits that she skipped doses of LT4 before last check.  She was not aware that she needs to take the medicine with water  Her to wait more than 30 minutes before she eats.  I reviewed pt's medications, allergies, PMH, social hx, family hx, and changes were documented in the history of present illness. Otherwise, unchanged from my initial visit note.   ROS: .  Constitutional:+weight loss, no fatigue, no subjective hyperthermia/hypothermia Eyes: no blurry vision, no xerophthalmia ENT: no sore throat, no nodules palpated in throat, no dysphagia/odynophagia, no hoarseness Cardiovascular: no CP/SOB/no palpitations/leg swelling Respiratory: no cough/SOB Gastrointestinal: no N/V/D/C Musculoskeletal: no muscle/joint aches Skin: no rashes  PE: BP 108/60 mmHg  Pulse 81  Temp(Src) 98.4 F (36.9 C) (Oral)  Resp 12  Wt 152 lb (68.947 kg)  SpO2 97% Body mass index is 28.74 kg/(m^2).  Wt Readings from Last 3 Encounters:  10/27/14 152 lb (68.947 kg)  10/07/14 152 lb (68.947 kg)  09/10/14 138 lb 12.8 oz (62.959 kg)   Constitutional:  overweight, in NAD Eyes: PERRLA, EOMI, no exophthalmos ENT: moist mucous membranes, no thyromegaly, no cervical lymphadenopathy Cardiovascular: RRR, + 3/6 SEM, no RG Respiratory: CTA B Gastrointestinal: abdomen soft, NT, ND, BS+ Musculoskeletal: no deformities, strength intact in all 4 Skin: moist, warm, no rashes Neurological: no tremor with outstretched hands, DTR normal in all 4  ASSESSMENT: 1. DM2, non-insulin-dependent, uncontrolled, without complications  2.  Hypothyroidism   PLAN:  1. Patient with well controlled diabetes, on oral antidiabetic regimen,  Only with metformin. She has been on Invokana 300,  However she had a potassium of 5.8 >> Invokana was stopped. We discussed  At last visitthat she needs to stay off it for now. I am hoping we can restart in the future, maybe at  100 mg daily dose.  We have tried glipizide after stopping Invokana, however, she developed lows and we had to stop it. At this visit , we reviewed her sugar log, and there is no needs to add glipizide for now.  Her sugars after dinner can be higher, but the majority of them are at goal. I advised her to continue Metformin 1000 mg bid.  Patient Instructions  Please continue Metformin 1000 mg 2x a day.  Please come back for a follow-up appointment in 1.5 months.  Take the thyroid hormone every day, with water, >30 minutes before breakfast, separated by >4 hours from acid reflux medications, calcium, iron, multivitamins.  - continue checking sugars at different times of the day - check 1-2 times a day, rotating checks - UTD with eye exam  - Return to clinic in 1.5 mo with sugar log.  Will check a fructosamine level then.  2.  Hypothyroidism -  Patient's last TSH was 7 area that that time, she was telling me that she was not completely compliant with her levothyroxine dose. -  At this visit, we discussed about correct administration of levothyroxine and I advised her to take the thyroid hormone every day,  with water, >30 minutes before breakfast, separated by >4 hours from acid reflux medications, calcium, iron, multivitamins.  She was not aware of some of these directions. Therefore, she will start taking it correctly and will check her TFTs when she comes back in a month and a half.

## 2014-10-27 NOTE — Patient Instructions (Addendum)
Patient Instructions  Please continue Metformin 1000 mg 2x a day.  Please come back for a follow-up appointment in 1.5 months.  Take the thyroid hormone every day, with water, >30 minutes before breakfast, separated by >4 hours from acid reflux medications, calcium, iron, multivitamins.

## 2014-11-02 NOTE — Discharge Summary (Signed)
PATIENT NAME:  Haley Young, Haley Young MR#:  [id number] DATE OF BIRTH:  [birth year]  DATE OF ADMISSION:  07/25/2011 DATE OF DISCHARGE:  07/28/2011  ADMITTING DIAGNOSIS: Chronic obstructive pulmonary exacerbation.   DISCHARGE DIAGNOSES:  1. Asthmatic bronchitis.  2. Relative hypoxia, resolved. 3. Nausea and vomiting, resolved. 4. Diabetes mellitus, poorly controlled with hemoglobin A1c 9.3, insulin dependent. 5. Anxiety. 6. 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  37. 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   DISCHARGE CONDITION: Stable.   DISCHARGE MEDICATIONS: The patient is to resume her outpatient medications which are: 1. Tylenol 500 mg once daily as needed. 2. Cheratussin AC 10/100 mg in 5 mL oral syrup, 2 teaspoonsful orally as needed.   ADDITIONAL MEDICATIONS:  1. Levaquin 750 mg p.o. daily for five days. 2. Advair Diskus 250/50 one puff twice daily. 3. Tussionex 5 mL twice daily. 4. Protonix 40 mg p.o. daily.  5. Sliding scale insulin. 6. Xanax 0.25 mg p.o. every eight hours as needed.  7. Glucophage 1 gram p.o. twice daily. 8. Insulin Lantus 25 subcutaneously daily. 9. Combivent oral inhaler 2 puffs every four hours as needed. 10. Prednisone 50 mg p.o. once on 07/29/2011, then taper by 10 mg every other day until stopped.   DIET: 1800 ADA, low sodium, low calorie. The patient was advised to lose weight if possible.   ACTIVITY LIMITATIONS: As tolerated.   FOLLOW-UP:  1. Follow-up appointment with Dr. Glori Bickers in two days after discharge. 2. Follow-up with Dr. Devona Konig in two days after discharge.    CONSULTANT: Care Management   RADIOLOGIC STUDIES:  1. Chest, PA and lateral, 07/25/2011 no acute disease of chest.  2. Repeated PA and lateral 07/26/2011 no acute cardiopulmonary disease. 3. Chest, PA and lateral, 07/27/2011, showed minimal right lung base  atelectasis or fibrosis. Mild thoracic scoliosis concave to the right with degenerative disk disease noted. 4. V/Q scan 07/27/2011 revealed low probability for pulmonary embolism. Images suggest a primary ventilatory abnormality. There is a matched defect along the area of major fissure on the left.   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 Please refer to Dr. Trena Platt admission note on 07/25/2011.   PHYSICAL EXAMINATION: On arrival to the Emergency Room, the patient's temperature was 98.2, heart rate was 124 beats per minute, respirations 22, blood pressure 126/75, saturation 93% on room air. Physical exam revealed decreased breath sounds bilaterally at bases and also expiratory wheezing throughout both lungs but no rhonchi or rales were noted, otherwise unremarkable physical exam.   LABORATORY DATA: Labs from 07/26/2011 showed glucose 411, BUN 22, otherwise unremarkable BMP. The patient's B-type Natriuretic Peptide taken on 07/25/2011 was slightly elevated at 156. Liver enzymes were not done. CBC was within normal limits. Urinalysis showed amber cloudy urine, negative for glucose, bilirubin, trace ketones were noted, specific gravity 1.024 was noted, pH 5.0, negative for blood, 100 mg/dL protein, negative for nitrites or leukocyte esterase, 1 red blood cell, 3 white blood cells were noted. No bacteria. 7 epithelial cells were noted as well as mucus was present and 14 hyaline casts. The patient's chest x-ray was unremarkable.   HOSPITAL COURSE: The patient was admitted to the hospital. She  was started on antibiotic therapy. Because over the past four weeks she received at least two courses of Zithromax and recently Augmentin which was not tolerated orally well  accompanied by nausea and vomiting, decision was made to start the patient on Zosyn. With this therapy as well as steroid therapy IV and later on p.o., the patient's condition improved. She, however, was not able to provide sputum for sputum cultures and very little amount of sputum was received on day of discharge, 07/28/2011. It is recommended to follow the patient's sputum cultures and adjust antibiotics according to culture results. Meanwhile, with therapy with Zosyn the patient improved somewhat. She also had less cough with cough suppressant. Her wheezing disappeared and her lungs sounded much better with better air entrance bilaterally. It was felt that patient likely had asthmatic bronchitis with questionable underlying COPD versus asthma, reactive airway disease. The patient is to follow-up with her primary care physician as well as pulmonologist, Dr. Devona Konig. I discussed the patient's case with Dr. Devona Konig who agreed to see her in the next few days after discharge. Initially the patient was noted to have relative hypoxia with oxygen saturation of 93% on room air at rest. She was, however, able to ambulate on room air on the day of discharge and her oxygen saturation remained stable at 95% on exertion.  For nausea and vomiting, it was felt to be likely medication side effect so no more Augmentin was given for her, however, Levaquin was prescribed as antibiotic therapy to continue for five more days.   For diabetes mellitus, Hemoglobin A1c was checked and was found to be high at 9.3. The patient's insulin was advanced and metformin was added to improve her blood glucose control. Now while she is on steroids, unfortunately, she will need to use some short-acting insulin to control her elevated blood glucose levels. However, it is recommended to follow the patient's fasting blood glucose levels whenever she is off steroids and advance medications as necessary.   The patient was noted to be  somewhat anxious during her stay in the hospital time. The patient was given Xanax with good results. She is given 30 pills of Xanax to be taken at home. If she still remains somewhat anxious and has apprehension about her diagnosis or her medical problems, she may need to be evaluated by a psychiatrist and maybe even have discussion with primary care physician or psychiatrist in regards to anxiolytic use.   On day of discharge, the patient felt satisfactory and did not complain of any significant discomfort. Her vitals were stable with temperature of 97.8, pulse 93, respiration rate 22, blood pressure 159/89, saturation 97% on room air at rest and 95% on exertion.     TIME SPENT: 40 minutes.   ____________________________ Theodoro Grist, MD rv:drc D: 07/28/2011 19:25:26 ET T: 07/29/2011 09:49:57 ET JOB#: [id number]  cc: Theodoro Grist, MD, <Dictator> Allyne Gee, MD Wynelle Fanny. Glori Bickers, MD Theodoro Grist MD ELECTRONICALLY SIGNED 08/08/2011 7:49

## 2014-11-02 NOTE — H&P (Signed)
PATIENT NAME:  Haley Young, OBI MR#:  [id number] DATE OF BIRTH:  [birth year]  DATE OF ADMISSION:  07/25/2011  PRIMARY CARE PHYSICIAN:  Dr. Loura Pardon.  REQUESTING PHYSICIAN: Dr. Beaulah Dinning.   CHIEF COMPLAINT: Cough and shortness of breath.   HISTORY OF PRESENT ILLNESS: The patient is a 64 year old female with no significant medical problems who is being admitted for chronic obstructive pulmonary disease exacerbation/acute bronchitis. The patient started having sore throat and sinus infection around Christmastime. Since then she has been coughing continuously, finished two courses of Z-PAC, has been going around different urgent care with new antibiotics started on the 12th of January (Augmentin) which she could not tolerate, started throwing up, could not sleep for the last two days and decided to come to the Emergency Department. While in the ED, she was wheezing, was very congested in her chest and producing yellow phlegm in the sputum. She has been having low-grade fever at home and lots of coughing. She is being admitted for further evaluation and management.   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   MEDICATIONS AT HOME:  1. Aspirin 325 mg daily as needed.  2. Augmentin 875 mg p.o. b.i.d. which was started on 07/23/2011 which she only took one dose and threw up, has not taken anymore since then.  3. Tylenol 500 mg p.o. daily as needed.  4. Ventolin HFA 2 puffs inhaled as needed.  5. Terazosin 2 teaspoonful (10 milliliters daily as needed).   ALLERGIES: OxyContin and prednisone.   SOCIAL HISTORY: No smoking. No alcohol. She works as a Nutritional therapist.   FAMILY HISTORY: Mother was a heavy smoker, had chronic obstructive pulmonary disease.  Father was also a smoker. Her ex-husband was also a smoker. She has had a heavy exposure to passive smoking. Aunt had breast cancer. Her grandmother had ovarian cancer.   REVIEW OF SYSTEMS: CONSTITUTIONAL: Low-grade fever. No fatigue or weakness. EYES: No blurry or double vision. ENT: No tinnitus or ear pain. RESPIRATORY: Recurrent bronchitis, at least 3 to 4 times in a year for the last several years. Had pulmonary stenosis for which she required surgery at [facility] at the age of [age] years followed by pulmonary valve replacement in 2005 at [facility]. Currently, cough with yellowish phlegm and shortness of breath. GASTROINTESTINAL: Positive for nausea and one episode of vomiting. No diarrhea or abdominal pain. GENITOURINARY: No dysuria or hematuria. ENDOCRINE: No polyuria or nocturia. HEMATOLOGY: No anemia or easy bruising. SKIN: No rash or lesion. MUSCULOSKELETAL: No arthritis or muscle cramps. NEUROLOGIC: No tingling, numbness, or weakness. PSYCHIATRIC: No history of anxiety or depression.   PHYSICAL EXAMINATION:  VITAL SIGNS: Temperature 98.2, heart rate 124 per minute, respirations 22 per minute, blood pressure 126/75. She is saturating 93% on room air.   GENERAL: The patient is a 64 year old female lying in the bed comfortably without any acute distress.   EYES: Pupils equal, round, reactive to light and accommodation. No scleral icterus. Extraocular muscles intact.   HEENT: Head atraumatic, normocephalic. Oropharynx and nasopharynx clear.   NECK: Supple. No jugular venous distention. No thyroid enlargement or tenderness.   LUNGS: Decreased breath sounds at the bases bilaterally. Expiratory wheezing throughout both lungs. No rhonchi or rales.   CARDIOVASCULAR: S1 and S2 normal, tachycardic. No  murmurs, rubs or gallops.   ABDOMEN: Soft, nontender, nondistended. Bowel sounds present. No organomegaly or mass.   EXTREMITIES: No pedal edema, cyanosis, or clubbing.   NEUROLOGIC: Nonfocal  examination. Cranial nerves III through XII intact. Muscle strength five out of five. Extremity sensation intact.   PSYCHIATRY: The patient is oriented to time, place and person x3.   SKIN: No obvious rash, lesion or ulcer.   LABORATORY, RADIOLOGICAL AND DIAGNOSTIC DATA: Normal CBC. Negative urinalysis. BNP of 156. Chest x-ray showed no acute cardiopulmonary disease.   IMPRESSION AND PLAN:  1. Chronic obstructive pulmonary disease exacerbation. Will start her on Advair, Spiriva, IV Solu-Medrol. She did not tolerate Augmentin as an outpatient. She has also finished Zithromax. Will try her on some aggressive IV while she is inpatient in the form of Zosyn. We can switch her over to Augmentin if she tolerates or other oral antibiotic. We will start her on nebulizer breathing treatment. If she does not improve, she may need to get CT scan of the chest for further evaluation including pulmonary consult if needed. She has had significant numbers of family members recently diagnosed with several different upper respiratory tract infections including pneumonia. Her brother has Down's syndrome, including her daughter who also has Down's, syndrome, both have pneumonia. Her son is autistic and he also came in with pneumonia in the last 2 to 3 weeks. We will monitor her in the hospital. 2. Acute bronchitis. Management as above. We will provide cough medicine in the form of Tussionex.  3. Insomnia. We will provide Ambien. This is likely due to unable to stop coughing at night. Hopefully, the cough medicine will help along with Ambien.   TOTAL TIME TAKING CARE OF THIS PATIENT: 55 minutes.   CODE STATUS: FULL CODE.   ____________________________ Eriverto Byrnes S. Manuella Ghazi, MD vss:ap D: 07/25/2011 11:05:30 ET T: 07/25/2011 11:18:33 ET JOB#: [id number] cc: Deonna Krummel S. Manuella Ghazi, MD, <Dictator> [place]. Glori Bickers, MD [place] MD ELECTRONICALLY SIGNED 07/27/2011 14:05

## 2014-11-03 ENCOUNTER — Encounter: Payer: Self-pay | Admitting: Internal Medicine

## 2014-11-14 DIAGNOSIS — I371 Nonrheumatic pulmonary valve insufficiency: Secondary | ICD-10-CM | POA: Diagnosis not present

## 2014-11-14 DIAGNOSIS — Q221 Congenital pulmonary valve stenosis: Secondary | ICD-10-CM | POA: Diagnosis not present

## 2014-12-05 ENCOUNTER — Encounter: Payer: Self-pay | Admitting: Internal Medicine

## 2014-12-16 ENCOUNTER — Encounter: Payer: Self-pay | Admitting: Internal Medicine

## 2014-12-16 ENCOUNTER — Ambulatory Visit (INDEPENDENT_AMBULATORY_CARE_PROVIDER_SITE_OTHER): Payer: Medicare Other | Admitting: Internal Medicine

## 2014-12-16 VITALS — BP 122/60 | HR 75 | Temp 98.2°F | Resp 12 | Wt 148.0 lb

## 2014-12-16 DIAGNOSIS — E119 Type 2 diabetes mellitus without complications: Secondary | ICD-10-CM | POA: Diagnosis not present

## 2014-12-16 DIAGNOSIS — E039 Hypothyroidism, unspecified: Secondary | ICD-10-CM | POA: Diagnosis not present

## 2014-12-16 LAB — T4, FREE: FREE T4: 0.71 ng/dL (ref 0.60–1.60)

## 2014-12-16 LAB — TSH: TSH: 10.17 u[IU]/mL — ABNORMAL HIGH (ref 0.35–4.50)

## 2014-12-16 MED ORDER — CANAGLIFLOZIN-METFORMIN HCL 150-500 MG PO TABS: ORAL_TABLET | ORAL | Status: DC

## 2014-12-16 NOTE — Patient Instructions (Signed)
Please resume InvokaMet 150-500 mg in am. Add 500 mg Metformin in am and 1000 mg Metformin at dinnertime.  Please stop at the lab.  Please also schedule a lab appt around 12/10/2014 to recheck your potassium and kidney function while on Invokana.  Stay well hydrated.  Please come back for a follow-up appointment in 3 months

## 2014-12-16 NOTE — Progress Notes (Addendum)
Patient ID: Haley Young, female   DOB: 12-28-1950, 64 y.o.   MRN: [id number]  HPI: Haley Young is a 64 y.o.-year-old female, returning for f/u for DM2, dx 2010, non-insulin-dependent, uncontrolled, without complications. Last visit 1.5 mo ago.  Last hemoglobin A1c was: 09/10/2014: HbA1c calculated from fructosamine is 6.9%! 03/19/2014:  HbA1c calculated from fructosamine is 6.57% Lab Results  Component Value Date   HGBA1C 7.9* 09/09/2014   HGBA1C 7.8* 03/19/2014   HGBA1C 8.8* 11/27/2013   Pt is on a regimen of: - Metformin 1000 mg po bid >> tolerates it well We started Glipizide as we had to stop Invokana >> tremors >> stopped She was on Invokana 300 mg daily >> stopped 2/2 high potassium She was on Invokana 300 mg daily - added 08/2013  - costs her 300$ >> at last visit I gave her a coupon for InvokaMet 2 tablets in am ([phone] mg) daily in am and continued Metformin 1000 mg with dinner. She tells she can get the 3 mo supply for 207%$ Tried insulin Lantus and Novolog - ~2011 Tried Cinnamon and vinegar.  Pt checks sugars 2x a day lately - higher: - am: 136-177 >> 110-141 >> 88-130s >> 100-130 >> 80-115 >> 117-147, 160 - 2h after b'fast: n/c - before lunch: n/c >> 63-139 - 2h after lunch: n/c >> 78, 137 >> n/c >> 102-132 >> n/c - before dinner: 140-150s >> n/c >> 103, 120 >> n/c>> 76-102 >> 128 - 2h after dinner: 129-154 (186, very few 200s after a sweet meal) >> n/c >> 109 >> 126-182 - bedtime: 114-160, few spikes in the 180s, 1x 202 >> 126-170, few >190-220 >> n/c >> 113-219  - nighttime: n/c >> 138-240, most 138-170 >> n/c >> 129-160 Unclear how high or how low she can get or if she has hypoglycemia awareness.   She started to change her diet. Pt's meals are: - Breakfast: banana + PB or cereals; coffee + sweetener. - Lunch: yoghurt + peaches, ham sandwich; soup - Dinner: baked chicken; peas; salad Less sweets; more fruit. Does not drink soft drinks.   She exercises 2-3x a  week by walking - 30 min.   - no CKD, last BUN/creatinine:  Lab Results  Component Value Date   BUN 25* 09/10/2014   CREATININE 0.96 09/10/2014   - last set of lipids: Lab Results  Component Value Date   CHOL 172 09/09/2014   HDL 45.00 09/09/2014   LDLCALC 105* 09/09/2014   LDLDIRECT 171.3 10/31/2011   TRIG 112.0 09/09/2014   CHOLHDL 4 09/09/2014  On Lipitor. - last eye exam was in 10/13/2014. No DR.  - no numbness and tingling in her feet.  She saw cardiology at [facility] (Dr. Leonides Schanz) for fatigue and SOB >> EKG stress test >> several presumed blockages. She had a catheterization >> no blockages. 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  She will have a new cardiac MRI at [facility] soon.  She also has hypothyroidism. Last TSH: Lab Results  Component Value Date   TSH 7.08* 09/09/2014  She admits that she skipped doses of LT4 before last check.  She was not aware that she needs to take the medicine with water  Her to wait more than 30 minutes before she eats.  I reviewed pt's medications, allergies, PMH, social hx, family hx, and changes were documented in the history of present illness. Otherwise, unchanged from my initial visit note.   ROS: .Constitutional: no weight loss,  no fatigue, no subjective hyperthermia/hypothermia Eyes: no blurry vision, no xerophthalmia ENT: no sore throat, no nodules palpated in throat, no dysphagia/odynophagia, no hoarseness Cardiovascular: no CP/SOB/no palpitations/leg swelling Respiratory: no cough/SOB Gastrointestinal: no N/V/D/C Musculoskeletal: no muscle/joint aches Skin: no rashes  PE: BP 122/60 mmHg  Pulse 75  Temp(Src) 98.2 F (36.8 C) (Oral)  Resp 12  Wt 148 lb (67.132 kg)  SpO2 97% Body mass index is 27.98 kg/(m^2).  Wt Readings from Last 3 Encounters:  12/16/14 148 lb (67.132 kg)  10/27/14 152 lb (68.947 kg)  10/07/14 152 lb (68.947 kg)   Constitutional: overweight, in NAD Eyes: PERRLA, EOMI, no exophthalmos ENT: moist  mucous membranes, no thyromegaly, no cervical lymphadenopathy Cardiovascular: RRR, + 3/6 SEM, no RG Respiratory: CTA B Gastrointestinal: abdomen soft, NT, ND, BS+ Musculoskeletal: no deformities, strength intact in all 4 Skin: moist, warm, no rashes Neurological: no tremor with outstretched hands, DTR normal in all 4  ASSESSMENT: 1. DM2, non-insulin-dependent, uncontrolled, without complications  2.  Hypothyroidism   PLAN:  1. Patient with well controlled diabetes, on oral antidiabetic regimen,  Only with metformin. She has been on Invokana 300 in the past, however she had a potassium of 5.8 >> Invokana was stopped. Since sugars higher >> will restart 150 mg daily dose. We have tried glipizide after stopping Invokana, however, she developed lows and we had to stop it.   Patient Instructions  Please resume InvokaMet 150-500 mg in am. Add 500 mg Metformin in am and 1000 mg Metformin at dinnertime.  Please stop at the lab.  Please also schedule a lab appt around 12/10/2014 to recheck your potassium and kidney function while on Invokana.  Stay well hydrated.  Please come back for a follow-up appointment in 3 months.  - continue checking sugars at different times of the day - check 1-2 times a day, rotating checks - UTD with eye exam  - Will check a fructosamine level today - will come back for a potassium level in 3 weeks - Return to clinic in 3 mo with sugar log.    2.  Hypothyroidism -  Reviewed last TSH >> 7 as she missed doses -  we again discussed about correct administration of levothyroxine and I advised her to take the thyroid hormone every day, with water, >30 minutes before breakfast, separated by >4 hours from acid reflux medications, calcium, iron, multivitamins.   - check TSH, fT4 today  Component     Latest Ref Rng 12/16/2014  TSH     0.35 - 4.50 uIU/mL 10.17 (H)  Free T4     0.60 - 1.60 ng/dL 0.71  TSH worse >> possibly some missed doses although she  denies.  Fructosamine pending.

## 2014-12-18 LAB — FRUCTOSAMINE: Fructosamine: 310 umol/L — ABNORMAL HIGH (ref 190–270)

## 2015-01-09 ENCOUNTER — Other Ambulatory Visit: Payer: Medicare Other

## 2015-01-09 DIAGNOSIS — E119 Type 2 diabetes mellitus without complications: Secondary | ICD-10-CM | POA: Diagnosis not present

## 2015-01-09 LAB — BASIC METABOLIC PANEL WITH GFR
BUN: 22 mg/dL (ref 6–23)
CALCIUM: 9.5 mg/dL (ref 8.4–10.5)
CO2: 22 mEq/L (ref 19–32)
Chloride: 91 mEq/L — ABNORMAL LOW (ref 96–112)
Creat: 0.99 mg/dL (ref 0.50–1.10)
GFR, Est African American: 69 mL/min
GFR, Est Non African American: 60 mL/min
Glucose, Bld: 292 mg/dL — ABNORMAL HIGH (ref 70–99)
Potassium: 5.3 mEq/L (ref 3.5–5.3)
Sodium: 131 mEq/L — ABNORMAL LOW (ref 135–145)

## 2015-01-15 ENCOUNTER — Encounter: Payer: Self-pay | Admitting: Internal Medicine

## 2015-01-16 ENCOUNTER — Other Ambulatory Visit: Payer: Self-pay | Admitting: Internal Medicine

## 2015-01-20 ENCOUNTER — Telehealth: Payer: Self-pay | Admitting: Internal Medicine

## 2015-01-20 NOTE — Telephone Encounter (Signed)
Called pt and advised her per Dr Gherghe's message below. Pt voiced understanding.  

## 2015-01-20 NOTE — Telephone Encounter (Signed)
Haley Young, please take off Invokamet from patient's medication list. She needs to take regular metformin 1000 mg twice a day along with Januvia 100 mg in a.m.

## 2015-01-20 NOTE — Telephone Encounter (Signed)
Pt call and needs someone to call her with directions on how to take her medications

## 2015-01-20 NOTE — Telephone Encounter (Signed)
Returned pt's call. Pt said that she got the Januvia (Tradjenta was not covered by her ins.). She wants to know if she is to continue the same dosage of Metformin? Please advise.

## 2015-01-21 ENCOUNTER — Ambulatory Visit (INDEPENDENT_AMBULATORY_CARE_PROVIDER_SITE_OTHER): Payer: Medicare Other | Admitting: Internal Medicine

## 2015-01-21 ENCOUNTER — Encounter: Payer: Self-pay | Admitting: Internal Medicine

## 2015-01-21 VITALS — BP 105/66 | HR 71 | Temp 98.0°F | Ht 61.0 in | Wt 143.2 lb

## 2015-01-21 DIAGNOSIS — F4323 Adjustment disorder with mixed anxiety and depressed mood: Secondary | ICD-10-CM

## 2015-01-21 DIAGNOSIS — E039 Hypothyroidism, unspecified: Secondary | ICD-10-CM | POA: Diagnosis not present

## 2015-01-21 DIAGNOSIS — E119 Type 2 diabetes mellitus without complications: Secondary | ICD-10-CM

## 2015-01-21 DIAGNOSIS — F419 Anxiety disorder, unspecified: Secondary | ICD-10-CM

## 2015-01-21 MED ORDER — LEVOTHYROXINE SODIUM 125 MCG PO TABS: 125.0000 ug | ORAL_TABLET | Freq: Every day | ORAL | Status: DC

## 2015-01-21 MED ORDER — LORAZEPAM 1 MG PO TABS: 1.0000 mg | ORAL_TABLET | Freq: Two times a day (BID) | ORAL | Status: DC | PRN

## 2015-01-21 NOTE — Assessment & Plan Note (Addendum)
Recent TSH was elevated. Will increase Levothyroxine to 154mcg daily and recheck TSH prior to visit in 03/2015.

## 2015-01-21 NOTE — Assessment & Plan Note (Signed)
Anxiety symptoms well controlled with Fluoxetine and prn Lorazepam. Will continue.

## 2015-01-21 NOTE — Patient Instructions (Addendum)
Labs in early September.  Please increase Levothyroxine to 179mcg daily.  Follow up in 6 months.

## 2015-01-21 NOTE — Progress Notes (Signed)
Pre visit review using our clinic review tool, if applicable. No additional management support is needed unless otherwise documented below in the visit note. 

## 2015-01-21 NOTE — Assessment & Plan Note (Signed)
Recent elevated BG. She has started Januvia. Plan to recheck A1c in 03/2015 and have follow up then with Dr. Cruzita Lederer.

## 2015-01-21 NOTE — Progress Notes (Signed)
Subjective:    Patient ID: Haley Young, female    DOB: August 26, 1950, 64 y.o.   MRN: [id number]  HPI  64YO female presents for follow up.  Last seen 10/07/2014.  DM - BG this morning was 130. Recently was started on Januvia. Plans to follow up with Dr. Cruzita Lederer in 03/2015. Trying to limit intake of sweets.  Symptoms of anxiety have been well controlled with prn ativan. Continues to deal with issues caring for her son with anger outbursts.  Told that she may need to increase thyroid medication as recent TSH elevated at 10. Reports compliance with levothyroxine dosing.  Past medical, surgical, family and social history per today's encounter.  Review of Systems  Constitutional: Negative for fever, chills, appetite change, fatigue and unexpected weight change.  Eyes: Negative for visual disturbance.  Respiratory: Negative for shortness of breath.   Cardiovascular: Negative for chest pain and leg swelling.  Gastrointestinal: Negative for abdominal pain and constipation.  Skin: Negative for color change and rash.  Hematological: Negative for adenopathy. Does not bruise/bleed easily.  Psychiatric/Behavioral: Negative for sleep disturbance and dysphoric mood. The patient is nervous/anxious.        Objective:    BP 105/66 mmHg  Pulse 71  Temp(Src) 98 F (36.7 C) (Oral)  Ht 5\' 1"  (1.549 m)  Wt 143 lb 4 oz (64.978 kg)  BMI 27.08 kg/m2  SpO2 97% Physical Exam  Constitutional: She is oriented to person, place, and time. She appears well-developed and well-nourished. No distress.  HENT:  Head: Normocephalic and atraumatic.  Right Ear: External ear normal.  Left Ear: External ear normal.  Nose: Nose normal.  Mouth/Throat: Oropharynx is clear and moist. No oropharyngeal exudate.  Eyes: Conjunctivae are normal. Pupils are equal, round, and reactive to light. Right eye exhibits no discharge. Left eye exhibits no discharge. No scleral icterus.  Neck: Normal range of motion. Neck supple. No  tracheal deviation present. No thyromegaly present.  Cardiovascular: Normal rate, regular rhythm and intact distal pulses.  Exam reveals no gallop and no friction rub.   Murmur heard. Pulmonary/Chest: Effort normal and breath sounds normal. No respiratory distress. She has no wheezes. She has no rales. She exhibits no tenderness.  Musculoskeletal: Normal range of motion. She exhibits no edema or tenderness.  Lymphadenopathy:    She has no cervical adenopathy.  Neurological: She is alert and oriented to person, place, and time. No cranial nerve deficit. She exhibits normal muscle tone. Coordination normal.  Skin: Skin is warm and dry. No rash noted. She is not diaphoretic. No erythema. No pallor.  Psychiatric: She has a normal mood and affect. Her behavior is normal. Judgment and thought content normal.          Assessment & Plan:   Problem List Items Addressed This Visit      Unprioritized   Adjustment disorder with mixed anxiety and depressed mood   Relevant Medications   LORazepam (ATIVAN) 1 MG tablet   Anxiety    Anxiety symptoms well controlled with Fluoxetine and prn Lorazepam. Will continue.      Relevant Medications   LORazepam (ATIVAN) 1 MG tablet   Diabetes mellitus type 2, controlled - Primary    Recent elevated BG. She has started Januvia. Plan to recheck A1c in 03/2015 and have follow up then with Dr. Cruzita Lederer.      Relevant Orders   Comprehensive metabolic panel   Hemoglobin A1c   Lipid panel   Microalbumin / creatinine urine ratio  Hypothyroidism    Recent TSH was elevated. Will increase Levothyroxine to 155mcg daily and recheck TSH prior to visit in 03/2015.      Relevant Medications   levothyroxine (SYNTHROID, LEVOTHROID) 125 MCG tablet   Other Relevant Orders   TSH   T4, free       Return in about 6 months (around 07/24/2015) for Wellness Visit.

## 2015-02-16 ENCOUNTER — Telehealth: Payer: Self-pay | Admitting: Internal Medicine

## 2015-02-16 MED ORDER — SITAGLIPTIN PHOSPHATE 100 MG PO TABS: ORAL_TABLET | ORAL | Status: DC

## 2015-02-16 NOTE — Telephone Encounter (Signed)
Done

## 2015-02-16 NOTE — Telephone Encounter (Signed)
Patient called and would like to cancel the request for her Rx  Rx: Januvia 90 day supply   Thank you

## 2015-02-16 NOTE — Telephone Encounter (Signed)
Pt needs rx for januvia called into [facility] 90 day supply

## 2015-02-25 ENCOUNTER — Ambulatory Visit (INDEPENDENT_AMBULATORY_CARE_PROVIDER_SITE_OTHER): Payer: Medicare Other | Admitting: Family Medicine

## 2015-02-25 ENCOUNTER — Encounter: Payer: Self-pay | Admitting: Family Medicine

## 2015-02-25 VITALS — BP 112/70 | HR 86 | Temp 98.5°F | Ht 61.0 in | Wt 140.2 lb

## 2015-02-25 DIAGNOSIS — J069 Acute upper respiratory infection, unspecified: Secondary | ICD-10-CM

## 2015-02-25 NOTE — Progress Notes (Signed)
Pre visit review using our clinic review tool, if applicable. No additional management support is needed unless otherwise documented below in the visit note. 

## 2015-02-25 NOTE — Patient Instructions (Signed)
Nice to meet you. You have a viral illness that is causing you nasal congestion and cough.  Please use mucinex in the morning and robitussin in the evening for this. You can use flonase 2 sprays in each nostril daily for your congestion.  If you develop worsening symptoms, shortness of breath, blood in your sputum, fever, chills, or chest pain please seek medical attention. If you do not continue to improve please let us know.

## 2015-02-25 NOTE — Assessment & Plan Note (Addendum)
Patient with signs and symptoms of viral URI. Vitals stable at this time. Cough likely related to post nasal drip and throat irritation. No wheezing or shortness of breath to indicate bronchitis at this time. Normal lung exam and lack of fever makes PNA unlikely cause. Given improvement at this time and only 5 days of symptoms patient does not require antibiotics at this time. Chest tightness likely related to cough and mucus from post nasal drip, though discussed obtaining EKG to eval for cardiac cause and patient declined this at this time. Discussed viral nature of her illness. Mucinex in am, robitussin in pm. Start on flonase OTC. Discussed return precautions.

## 2015-02-25 NOTE — Progress Notes (Signed)
Patient ID: Haley Young, female   DOB: 04-15-1951, 64 y.o.   MRN: [id number]  [name], MD Phone: [phone]  Haley Young is a 64 y.o. female who presents today for same day visit.  Notes sore throat with post nasal drip and frontal sinus pressure starting on Saturday. Notes pressure is worse with leaning forward. Notes ears have been popping and achey. No fever or dyspnea. States has had a minimal central chest tightness with cough. No radiation of tightness. Not exertional. Not associated with diaphoresis. No tightness at this time. No history of MI. Notes cough is worse in the morning. Is productive of yellow mucus. Has history of bronchitis. No wheezing with this. Notes husband had similar symptoms. She notes she is feeling better than previously. Cough and congestion is improving. Notes symptoms are now mostly in her nose. Has been using mucinex, robitussin, and afrin for this.   PMH: nonsmoker.    ROS see HPI  Objective  Physical Exam Filed Vitals:   02/25/15 1020  BP: 112/70  Pulse: 86  Temp: 98.5 F (36.9 C)    Physical Exam  Constitutional: She is well-developed, well-nourished, and in no distress.  Well appearing. Non-productive cough while in the room.  HENT:  Head: Normocephalic and atraumatic.  Right Ear: External ear normal.  Left Ear: External ear normal.  Bilateral TMs normal, no purulent fluid behind TMs, posterior OP with mild erythema and post nasal drip, normal tonsils, no exudates  Eyes: Conjunctivae are normal. Pupils are equal, round, and reactive to light. Right eye exhibits no discharge. Left eye exhibits no discharge.  Neck: Neck supple.  Cardiovascular: Normal rate and regular rhythm.  Exam reveals no gallop and no friction rub.   Murmur (has pulmonary valve replacement) heard. Pulmonary/Chest: Effort normal and breath sounds normal. No respiratory distress. She has no wheezes. She has no rales.  Lymphadenopathy:    She has no cervical  adenopathy.  Neurological: She is alert.  Skin: Skin is warm and dry. She is not diaphoretic.     Assessment/Plan: Please see individual problem list.  URI (upper respiratory infection) Patient with signs and symptoms of viral URI. Vitals stable at this time. Cough likely related to post nasal drip and throat irritation. No wheezing or shortness of breath to indicate bronchitis at this time. Normal lung exam and lack of fever makes PNA unlikely cause. Given improvement at this time and only 5 days of symptoms patient does not require antibiotics at this time. Chest tightness likely related to cough and mucus from post nasal drip, though discussed obtaining EKG to eval for cardiac cause and patient declined this at this time. Discussed viral nature of her illness. Mucinex in am, robitussin in pm. Start on flonase OTC. Discussed return precautions.     [name]

## 2015-03-12 ENCOUNTER — Other Ambulatory Visit (INDEPENDENT_AMBULATORY_CARE_PROVIDER_SITE_OTHER): Payer: Medicare Other

## 2015-03-12 DIAGNOSIS — E039 Hypothyroidism, unspecified: Secondary | ICD-10-CM | POA: Diagnosis not present

## 2015-03-12 DIAGNOSIS — E119 Type 2 diabetes mellitus without complications: Secondary | ICD-10-CM | POA: Diagnosis not present

## 2015-03-12 LAB — COMPREHENSIVE METABOLIC PANEL
ALBUMIN: 4.3 g/dL (ref 3.5–5.2)
ALK PHOS: 63 U/L (ref 39–117)
ALT: 20 U/L (ref 0–35)
AST: 25 U/L (ref 0–37)
BUN: 25 mg/dL — AB (ref 6–23)
CALCIUM: 9.9 mg/dL (ref 8.4–10.5)
CO2: 29 mEq/L (ref 19–32)
Chloride: 99 mEq/L (ref 96–112)
Creatinine, Ser: 1.01 mg/dL (ref 0.40–1.20)
GFR: 58.17 mL/min — AB (ref >60.00)
Glucose, Bld: 162 mg/dL — ABNORMAL HIGH (ref 70–99)
POTASSIUM: 4.5 meq/L (ref 3.5–5.1)
SODIUM: 135 meq/L (ref 135–145)
TOTAL PROTEIN: 7.3 g/dL (ref 6.0–8.3)
Total Bilirubin: 0.4 mg/dL (ref 0.2–1.2)

## 2015-03-12 LAB — T4, FREE: FREE T4: 0.86 ng/dL (ref 0.60–1.60)

## 2015-03-12 LAB — LIPID PANEL
CHOLESTEROL: 152 mg/dL (ref 0–200)
HDL: 48.2 mg/dL (ref >39.00)
LDL Cholesterol: 88 mg/dL (ref 0–99)
NONHDL: 103.59
Total CHOL/HDL Ratio: 3
Triglycerides: 79 mg/dL (ref 0.0–149.0)
VLDL: 15.8 mg/dL (ref 0.0–40.0)

## 2015-03-12 LAB — TSH: TSH: 6.32 u[IU]/mL — AB (ref 0.35–4.50)

## 2015-03-12 LAB — MICROALBUMIN / CREATININE URINE RATIO
CREATININE, U: 71 mg/dL
MICROALB UR: 0.9 mg/dL (ref 0.0–1.9)
Microalb Creat Ratio: 1.3 mg/g (ref 0.0–30.0)

## 2015-03-12 LAB — HEMOGLOBIN A1C: HEMOGLOBIN A1C: 7.5 % — AB (ref 4.6–6.5)

## 2015-03-18 ENCOUNTER — Ambulatory Visit (INDEPENDENT_AMBULATORY_CARE_PROVIDER_SITE_OTHER): Payer: Medicare Other | Admitting: Internal Medicine

## 2015-03-18 ENCOUNTER — Encounter: Payer: Self-pay | Admitting: Internal Medicine

## 2015-03-18 VITALS — BP 100/62 | HR 77 | Temp 97.6°F | Resp 12 | Wt 144.0 lb

## 2015-03-18 DIAGNOSIS — E039 Hypothyroidism, unspecified: Secondary | ICD-10-CM | POA: Diagnosis not present

## 2015-03-18 DIAGNOSIS — E119 Type 2 diabetes mellitus without complications: Secondary | ICD-10-CM

## 2015-03-18 MED ORDER — LEVOTHYROXINE SODIUM 137 MCG PO CAPS: ORAL_CAPSULE | ORAL | Status: DC

## 2015-03-18 NOTE — Progress Notes (Signed)
Patient ID: Haley Young, female   DOB: [birth year], 64 y.o.   MRN: [id number]  HPI: Haley Young is a 64 y.o.-year-old female, returning for f/u for DM2, dx 2010, non-insulin-dependent, uncontrolled, without complications. Last visit 3 mo ago.  Last hemoglobin A1c was lower as per last check by PCP 1 week ago: Lab Results  Component Value Date   HGBA1C 7.5* 03/12/2015   HGBA1C 7.9* 09/09/2014   HGBA1C 7.8* 03/19/2014  12/18/2014: HbA1c calculated from fructosamine is 6.88% 09/10/2014: HbA1c calculated from fructosamine is 6.9% 03/19/2014:  HbA1c calculated from fructosamine is 6.57%  Pt is on a regimen of: - Metformin 1000 mg po bid >> tolerates it well - Januvia 100 mg daily in am We started Glipizide as we had to stop Invokana >> tremors >> stopped She was on Invokana 300 mg daily >> stopped 2/2 high potassium She was on Invokana 300 mg daily - added 08/2013  - costs her 300$ >> at last visit I gave her a coupon for InvokaMet 2 tablets in am ([phone] mg) daily in am and continued Metformin 1000 mg with dinner. She tells she can get the 3 mo supply for 207%$ Tried insulin Lantus and Novolog - ~2011 Tried Cinnamon and vinegar.  Pt checks sugars 2x a day lately - same: - am: 136-177 >> 110-141 >> 88-130s >> 100-130 >> 80-115 >> 117-147, 160 >> 109-134, 165 - 2h after b'fast: n/c - before lunch: n/c >> 63-139 >> n/c - 2h after lunch: n/c >> 78, 137 >> n/c >> 102-132 >> n/c - before dinner: 140-150s >> n/c >> 103, 120 >> n/c>> 76-102 >> 128 >> 119-152 - 2h after dinner: 109 >> 126-182 >> 134-184 - bedtime: 126-170, few >190-220 >> n/c >> 113-219 >> 127-166, 186  - nighttime: n/c >> 138-240, most 138-170 >> n/c >> 129-160 >> n/c Unclear how high or how low she can get or if she has hypoglycemia awareness.   She started to change her diet. Pt's meals are: - Breakfast: banana + PB or cereals; coffee + sweetener. - Lunch: yoghurt + peaches, ham sandwich; soup - Dinner: baked chicken;  peas; salad Less sweets; more fruit. Does not drink soft drinks.   She exercises 2-3x a week by walking - 30 min.   - no CKD, last BUN/creatinine:  Lab Results  Component Value Date   BUN 25* 03/12/2015   CREATININE 1.01 03/12/2015  03/12/2015: ACR 1.3 - last set of lipids: Lab Results  Component Value Date   CHOL 152 03/12/2015   HDL 48.20 03/12/2015   LDLCALC 88 03/12/2015   LDLDIRECT 171.3 10/31/2011   TRIG 79.0 03/12/2015   CHOLHDL 3 03/12/2015  On Lipitor. - last eye exam was in 10/13/2014. No DR.  - no numbness and tingling in her feet.  She saw cardiology at [facility] (Dr. Leonides Schanz) for fatigue and SOB >> EKG stress test >> several presumed blockages. She had a catheterization >> no blockages. 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   She also has hypothyroidism. Last TSH: Lab Results  Component Value Date   TSH 6.32* 03/12/2015   TSH 10.17* 12/16/2014   TSH 7.08* 09/09/2014   TSH 3.06 09/13/2013   TSH 4.82 06/28/2012   TSH 6.72* 03/29/2012   TSH 7.23* 08/24/2011   FREET4 0.86 03/12/2015   FREET4 0.71 12/16/2014   FREET4 0.75 03/05/2010   FREET4 0.7 12/08/2009   She is on LT4 125 mcg daily: - no skipped doses -  in am - with water - separated by >30 min - no PPI, calcium, Fe, MVI  I reviewed pt's medications, allergies, PMH, social hx, family hx, and changes were documented in the history of present illness. Otherwise, unchanged from my initial visit note.   ROS: .Constitutional: no weight loss, no fatigue, no subjective hyperthermia/hypothermia Eyes: no blurry vision, no xerophthalmia ENT: no sore throat, no nodules palpated in throat, no dysphagia/odynophagia, no hoarseness Cardiovascular: no CP/SOB/no palpitations/leg swelling Respiratory: no cough/SOB Gastrointestinal: no N/V/D/C Musculoskeletal: no muscle/joint aches Skin: no rashes  PE: BP 100/62 mmHg  Pulse 77  Temp(Src) 97.6 F (36.4 C) (Oral)  Resp 12  Wt 144 lb (65.318 kg)  SpO2  96% Body mass index is 27.22 kg/(m^2).  Wt Readings from Last 3 Encounters:  03/18/15 144 lb (65.318 kg)  02/25/15 140 lb 3.2 oz (63.594 kg)  01/21/15 143 lb 4 oz (64.978 kg)   Constitutional: overweight, in NAD Eyes: PERRLA, EOMI, no exophthalmos ENT: moist mucous membranes, no thyromegaly, no cervical lymphadenopathy Cardiovascular: RRR, + 3/6 SEM, no RG Respiratory: CTA B Gastrointestinal: abdomen soft, NT, ND, BS+ Musculoskeletal: no deformities, strength intact in all 4 Skin: moist, warm, no rashes Neurological: no tremor with outstretched hands, DTR normal in all 4  ASSESSMENT: 1. DM2, non-insulin-dependent, uncontrolled, without complications  2.  Hypothyroidism   PLAN:  1. Patient with well controlled diabetes, on oral antidiabetic regimen, with metformin + we added Januvia at last visit. She has been on Invokana 300 in the past, however she had a potassium of 5.8 >> Invokana was stopped. We have tried glipizide after stopping Invokana, however, she developed lows and we had to stop it.  Patient Instructions  Please continue: - Metformin 1000 mg 2x a day with meals - Januvia 100 mg in am  Please come back for a follow-up appointment in 3 months.  - continue checking sugars at different times of the day - check 1-2 times a day, rotating checks - UTD with eye exam  - Will check a fructosamine level at next visit - Return to clinic in 3 mo with sugar log.    2.  Hypothyroidism - pt would want me to follow her for this -  Reviewed last TSH >> 6 (1 week ago) on LT4 125 mcg daily -  we again discussed about correct administration of levothyroxine and I advised her to take the thyroid hormone every day, with water, >30 minutes before breakfast, separated by >4 hours from acid reflux medications, calcium, iron, multivitamins. She denies missing doses anymore. - will increase her LT4 dose to 137 mcg daily.  Patient Instructions  Please increase Levothyroxine to 137 mcg  daily.  Take the thyroid hormone every day, with water, >30 minutes before breakfast, separated by >4 hours from acid reflux medications, calcium, iron, multivitamins.  Please come back for a follow-up appointment in 3 months.

## 2015-03-18 NOTE — Patient Instructions (Addendum)
Please continue: - Metformin 1000 mg 2x a day with meals - Januvia 100 mg in am  Please increase Levothyroxine to 137 mcg daily.  Take the thyroid hormone every day, with water, >30 minutes before breakfast, separated by >4 hours from acid reflux medications, calcium, iron, multivitamins.  Please come back for a follow-up appointment in 3 months.

## 2015-03-25 ENCOUNTER — Other Ambulatory Visit: Payer: Self-pay | Admitting: Internal Medicine

## 2015-03-27 ENCOUNTER — Telehealth: Payer: Self-pay | Admitting: Internal Medicine

## 2015-03-27 DIAGNOSIS — R5383 Other fatigue: Secondary | ICD-10-CM | POA: Insufficient documentation

## 2015-03-27 DIAGNOSIS — R5382 Chronic fatigue, unspecified: Secondary | ICD-10-CM

## 2015-03-27 NOTE — Telephone Encounter (Signed)
Patient called stating that she has some on going issues  She would like to speak with [facility]   Call back:[phone]  Thank you

## 2015-03-27 NOTE — Telephone Encounter (Signed)
See below

## 2015-03-27 NOTE — Telephone Encounter (Signed)
Returned pt's call. Pt stated that she is still feeling confused, lethargic at times and fatigued. She wants to know if Dr Cruzita Lederer would be willing to order thyroid panel and check her pancreas? Please advise.

## 2015-03-27 NOTE — Telephone Encounter (Signed)
Were recently checked her thyroid tests and the fructosamine. No need to repeat the tests. What I would like to check, though, is a test for adrenal insufficiency. I placed the orders for a cosyntropin stimulation test and please tell her to come to the lab at 8 AM and plan to be here for 1 hour to have the test done.

## 2015-03-30 NOTE — Telephone Encounter (Signed)
Called pt and advised her per Dr Arman Filter message below. Pt voiced understanding. Is going to read about adrenal glands and adrenal insufficiency first. Pt will call back to advise.

## 2015-03-31 ENCOUNTER — Telehealth: Payer: Self-pay | Admitting: Internal Medicine

## 2015-03-31 NOTE — Telephone Encounter (Signed)
Patient stated she looked it up that she is willing to take the test.

## 2015-03-31 NOTE — Telephone Encounter (Signed)
Pt called back. She is willing to take the cosyntropin stim test. I will schedule. Be advised.

## 2015-03-31 NOTE — Telephone Encounter (Signed)
Advised Dr Cruzita Lederer.

## 2015-04-24 DIAGNOSIS — D225 Melanocytic nevi of trunk: Secondary | ICD-10-CM | POA: Diagnosis not present

## 2015-04-24 DIAGNOSIS — D2271 Melanocytic nevi of right lower limb, including hip: Secondary | ICD-10-CM | POA: Diagnosis not present

## 2015-04-24 DIAGNOSIS — D2262 Melanocytic nevi of left upper limb, including shoulder: Secondary | ICD-10-CM | POA: Diagnosis not present

## 2015-04-24 DIAGNOSIS — D2261 Melanocytic nevi of right upper limb, including shoulder: Secondary | ICD-10-CM | POA: Diagnosis not present

## 2015-05-20 ENCOUNTER — Other Ambulatory Visit: Payer: Self-pay | Admitting: Internal Medicine

## 2015-05-20 DIAGNOSIS — F4323 Adjustment disorder with mixed anxiety and depressed mood: Secondary | ICD-10-CM | POA: Diagnosis not present

## 2015-05-20 DIAGNOSIS — I371 Nonrheumatic pulmonary valve insufficiency: Secondary | ICD-10-CM | POA: Diagnosis not present

## 2015-05-20 DIAGNOSIS — Q221 Congenital pulmonary valve stenosis: Secondary | ICD-10-CM | POA: Diagnosis not present

## 2015-05-28 ENCOUNTER — Telehealth: Payer: Self-pay | Admitting: Internal Medicine

## 2015-05-28 ENCOUNTER — Ambulatory Visit: Payer: Medicare Other | Admitting: Family Medicine

## 2015-05-28 NOTE — Telephone Encounter (Signed)
Spoke with the patient regarding her daughter's imaging results and patient requested a office visit with Dr. Gilford Rile regarding her depression.  Patient was told that Dr. Gilford Rile had an opening next week early week and she requested to see another provider tomorrow.  Placed on Dr. Ellen Henri schedule to be seen. Patient is concerned that her Prozac is not working anymore, she is very depressed and the holidays are making it harder.

## 2015-05-29 ENCOUNTER — Ambulatory Visit (INDEPENDENT_AMBULATORY_CARE_PROVIDER_SITE_OTHER): Payer: Medicare Other | Admitting: Family Medicine

## 2015-05-29 ENCOUNTER — Encounter: Payer: Self-pay | Admitting: Family Medicine

## 2015-05-29 VITALS — BP 112/66 | HR 85 | Temp 98.3°F | Ht 61.0 in

## 2015-05-29 DIAGNOSIS — F4323 Adjustment disorder with mixed anxiety and depressed mood: Secondary | ICD-10-CM | POA: Diagnosis not present

## 2015-05-29 MED ORDER — LORAZEPAM 1 MG PO TABS: 0.5000 mg | ORAL_TABLET | Freq: Two times a day (BID) | ORAL | Status: DC | PRN

## 2015-05-29 NOTE — Assessment & Plan Note (Signed)
Symptoms have worsened recently as she approaches the holidays. Most of her symptoms seem to be tied to the death of family members, specifically her brother, and worry regarding her daughter with Down syndrome. She does not have any SI or HI. Discussed having her see a therapist and she was given contact information for Dr. Jasmine December and patient was advised that she needed to call to set the appointment up. Given her drowsiness with the 1 mg of Ativan we will decrease this to half a milligram twice a day as needed for anxiety. She will continue the Prozac. She will return in 1 month for follow-up with her PCP. She is given return precautions.

## 2015-05-29 NOTE — Progress Notes (Signed)
Pre visit review using our clinic review tool, if applicable. No additional management support is needed unless otherwise documented below in the visit note. 

## 2015-05-29 NOTE — Patient Instructions (Signed)
Nice to see you. Please call the therapist at the number provided to set up an appointment. We're going to half the dose of your Ativan. You should take Ativan 0.5 mg twice daily as needed for anxiety. If this still makes you drowsy please let us know. If you develop thoughts of hurting your self or others please seek medical attention immediately.

## 2015-05-29 NOTE — Progress Notes (Signed)
Patient ID: Haley Young, female   DOB: 1950-12-22, 64 y.o.   MRN: [id number]  Haley Rumps, MD Phone: [phone]  Haley Young is a 64 y.o. female who presents today for same-day visit.  Depression: Patient notes increasing feelings of depression over the last year since her brother died. He was 44 and had Down syndrome. She notes he got sick and ended up with pneumonia, she states she was not told by his group home that he was sick until he was in the emergency room. Notes he went on life-support and died after coming off of this. She notes her daughter has Down syndrome as well she thinks about her daughter having a similar illness. She notes she has been getting worse recently as they approach the holidays. She specifically notes increasing depression, having little interest in doing things, having sleep issues, and having little energy. She's been on Prozac for some time though does not take this daily. She has been taking Ativan 1 mg at night and trazodone is well-developed person sleep. She denies SI and HI. She has not ever seen a therapist. GAD 7 score 9 very difficult PHQ 9 score 14 very difficult  PMH: nonsmoker.   ROS see history of present illness  Objective  Physical Exam Filed Vitals:   05/29/15 1004  BP: 112/66  Pulse: 85  Temp: 98.3 F (36.8 C)   Physical Exam  Constitutional: She is well-developed, well-nourished, and in no distress.  HENT:  Head: Normocephalic and atraumatic.  Cardiovascular: Normal rate, regular rhythm and normal heart sounds.  Exam reveals no gallop and no friction rub.   No murmur heard. Pulmonary/Chest: Effort normal and breath sounds normal. No respiratory distress. She has no wheezes. She has no rales.  Skin: Skin is warm and dry. She is not diaphoretic.  Psychiatric:  Mood depressed and anxious, affect depressed, intermittently tearful, though intermittently smiles     Assessment/Plan: Please see individual problem  list.  Adjustment disorder with mixed anxiety and depressed mood Symptoms have worsened recently as she approaches the holidays. Most of her symptoms seem to be tied to the death of family members, specifically her brother, and worry regarding her daughter with Down syndrome. She does not have any SI or HI. Discussed having her see a therapist and she was given contact information for Dr. Jasmine December and patient was advised that she needed to call to set the appointment up. Given her drowsiness with the 1 mg of Ativan we will decrease this to half a milligram twice a day as needed for anxiety. She will continue the Prozac. She will return in 1 month for follow-up with her PCP. She is given return precautions.    Meds ordered this encounter  Medications  . LORazepam (ATIVAN) 1 MG tablet    Sig: Take 0.5 tablets (0.5 mg total) by mouth 2 (two) times daily as needed for anxiety.    Dispense:  60 tablet    Refill:  1   Haley Young

## 2015-06-22 ENCOUNTER — Ambulatory Visit: Payer: Medicare Other | Admitting: Internal Medicine

## 2015-08-11 ENCOUNTER — Telehealth: Payer: Self-pay | Admitting: Internal Medicine

## 2015-08-11 ENCOUNTER — Other Ambulatory Visit: Payer: Self-pay

## 2015-08-11 MED ORDER — METFORMIN HCL 1000 MG PO TABS: 1000.0000 mg | ORAL_TABLET | Freq: Two times a day (BID) | ORAL | Status: DC

## 2015-08-11 MED ORDER — FLUOXETINE HCL 40 MG PO CAPS: 40.0000 mg | ORAL_CAPSULE | Freq: Every day | ORAL | Status: DC

## 2015-08-11 NOTE — Telephone Encounter (Signed)
Pt called needing refills for FLUoxetine (PROZAC) 40 MG capsule. Pharmacy is [facility], [place]. Pt is requesting 90day supply. Call pt @ [phone]. Thank you!

## 2015-08-11 NOTE — Telephone Encounter (Signed)
Patient called stating that she would like a refill on her medication   Rx: Metformin   Pharmacy: [facility]    Thank you

## 2015-08-11 NOTE — Telephone Encounter (Signed)
RX filled.

## 2015-08-11 NOTE — Telephone Encounter (Signed)
Refill sent to pt's pharmacy. 

## 2015-08-21 ENCOUNTER — Encounter: Payer: Self-pay | Admitting: Internal Medicine

## 2015-08-21 ENCOUNTER — Other Ambulatory Visit (INDEPENDENT_AMBULATORY_CARE_PROVIDER_SITE_OTHER): Payer: Medicare Other | Admitting: *Deleted

## 2015-08-21 ENCOUNTER — Ambulatory Visit (INDEPENDENT_AMBULATORY_CARE_PROVIDER_SITE_OTHER): Payer: Medicare Other | Admitting: Internal Medicine

## 2015-08-21 VITALS — BP 114/68 | HR 93 | Temp 98.0°F | Resp 12 | Wt 143.6 lb

## 2015-08-21 DIAGNOSIS — R7309 Other abnormal glucose: Secondary | ICD-10-CM | POA: Diagnosis not present

## 2015-08-21 DIAGNOSIS — E1165 Type 2 diabetes mellitus with hyperglycemia: Secondary | ICD-10-CM | POA: Insufficient documentation

## 2015-08-21 DIAGNOSIS — Z23 Encounter for immunization: Secondary | ICD-10-CM

## 2015-08-21 DIAGNOSIS — E039 Hypothyroidism, unspecified: Secondary | ICD-10-CM

## 2015-08-21 DIAGNOSIS — Z794 Long term (current) use of insulin: Secondary | ICD-10-CM | POA: Insufficient documentation

## 2015-08-21 DIAGNOSIS — E119 Type 2 diabetes mellitus without complications: Secondary | ICD-10-CM | POA: Diagnosis not present

## 2015-08-21 LAB — POCT GLYCOSYLATED HEMOGLOBIN (HGB A1C): HEMOGLOBIN A1C: 9.1

## 2015-08-21 LAB — TSH: TSH: 2.38 u[IU]/mL (ref 0.35–4.50)

## 2015-08-21 LAB — T4, FREE: Free T4: 1.19 ng/dL (ref 0.60–1.60)

## 2015-08-21 NOTE — Patient Instructions (Signed)
Please continue: - Metformin 1000 mg 2x a day with meals - Januvia 100 mg in am  Please continue Levothyroxine 137 mcg daily.  Take the thyroid hormone every day, with water, >30 minutes before breakfast, separated by >4 hours from acid reflux medications, calcium, iron, multivitamins.  Please come back for a follow-up appointment in 4 months.

## 2015-08-21 NOTE — Progress Notes (Signed)
Patient ID: Haley Young, female   DOB: June 11, 1951, 65 y.o.   MRN: [id number]  HPI: Haley Young is a 65 y.o.-year-old female, returning for f/u for DM2, dx 2010, non-insulin-dependent, uncontrolled, without complications. Last visit 5 mo ago.  Last hemoglobin A1c was lower as per last check by PCP 1 week ago: Lab Results  Component Value Date   HGBA1C 7.5* 03/12/2015   HGBA1C 7.9* 09/09/2014   HGBA1C 7.8* 03/19/2014  12/18/2014: HbA1c calculated from fructosamine is 6.88% 09/10/2014: HbA1c calculated from fructosamine is 6.9% 03/19/2014:  HbA1c calculated from fructosamine is 6.57%  Pt is on a regimen of: - Metformin 1000 mg po bid >> tolerates it well - Januvia 100 mg daily in am We started Glipizide as we had to stop Invokana >> tremors >> stopped She was on Invokana 300 mg daily >> stopped 2/2 high potassium She was on Invokana 300 mg daily - added 08/2013  - costs her 300$ >> at last visit I gave her a coupon for InvokaMet 2 tablets in am [phone]) daily in am and continued Metformin 1000 mg with dinner. She tells she can get the 3 mo supply for 207%$ Tried insulin Lantus and Novolog - ~2011 Tried Cinnamon and vinegar.  Pt checks sugars 2x a day lately - same: - am: 136-177 >> 110-141 >> 88-130s >> 100-130 >> 80-115 >> 117-147, 160 >> 109-134, 165 >> 110-115 - 2h after b'fast: n/c - before lunch: n/c >> 63-139 >> n/c - 2h after lunch: n/c >> 78, 137 >> n/c >> 102-132 >> n/c - before dinner: 140-150s >> n/c >> 103, 120 >> n/c>> 76-102 >> 128 >> 119-152 >> 100-140 - 2h after dinner: 109 >> 126-182 >> 134-184 - bedtime: 126-170, few >190-220 >> n/c >> 113-219 >> 127-166, 186 >> 120-160, 180x2 (icecream) - nighttime: n/c >> 138-240, most 138-170 >> n/c >> 129-160 >> n/c Unclear how high or how low she can get or if she has hypoglycemia awareness.   She started to change her diet. Pt's meals are: - Breakfast: banana + PB or cereals; coffee + sweetener. - Lunch: yoghurt +  peaches, ham sandwich; soup - Dinner: baked chicken; peas; salad Less sweets; more fruit. Does not drink soft drinks.   She exercises 2-3x a week by walking - 30 min.   - no CKD, last BUN/creatinine:  Lab Results  Component Value Date   BUN 25* 03/12/2015   CREATININE 1.01 03/12/2015  03/12/2015: ACR 1.3 - last set of lipids: Lab Results  Component Value Date   CHOL 152 03/12/2015   HDL 48.20 03/12/2015   LDLCALC 88 03/12/2015   LDLDIRECT 171.3 10/31/2011   TRIG 79.0 03/12/2015   CHOLHDL 3 03/12/2015  On Lipitor. - last eye exam was in 10/13/2014. No DR.  - no numbness and tingling in her feet.  She saw cardiology at [facility] (Dr. Leonides Schanz) for fatigue and SOB >> EKG stress test >> several presumed blockages. She had a catheterization >> no blockages. 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   She also has hypothyroidism. Last TSH: Lab Results  Component Value Date   TSH 6.32* 03/12/2015   TSH 10.17* 12/16/2014   TSH 7.08* 09/09/2014   TSH 3.06 09/13/2013   TSH 4.82 06/28/2012   TSH 6.72* 03/29/2012   TSH 7.23* 08/24/2011   FREET4 0.86 03/12/2015   FREET4 0.71 12/16/2014   FREET4 0.75 03/05/2010   FREET4 0.7 12/08/2009   She is on LT4  137 mcg daily: - no skipped doses - in am - with water - separated by >30 min - no PPI, calcium, Fe, MVI  I reviewed pt's medications, allergies, PMH, social hx, family hx, and changes were documented in the history of present illness. Otherwise, unchanged from my initial visit note.   ROS: .Constitutional: no weight loss, + fatigue, no subjective hyperthermia/hypothermia Eyes: no blurry vision, no xerophthalmia ENT: no sore throat, no nodules palpated in throat, no dysphagia/odynophagia, no hoarseness Cardiovascular: no CP/SOB/no palpitations/leg swelling Respiratory: no cough/SOB Gastrointestinal: no N/V/D/C Musculoskeletal: no muscle/joint aches Skin: no rashes + low libido  PE: BP 114/68 mmHg  Pulse 93  Temp(Src)  98 F (36.7 C) (Oral)  Resp 12  Wt 143 lb 9.6 oz (65.137 kg)  SpO2 94% Body mass index is 27.15 kg/(m^2).  Wt Readings from Last 3 Encounters:  08/21/15 143 lb 9.6 oz (65.137 kg)  03/18/15 144 lb (65.318 kg)  02/25/15 140 lb 3.2 oz (63.594 kg)   Constitutional: overweight, in NAD Eyes: PERRLA, EOMI, no exophthalmos ENT: moist mucous membranes, no thyromegaly, no cervical lymphadenopathy Cardiovascular: RRR, + 3/6 SEM, no RG Respiratory: CTA B Gastrointestinal: abdomen soft, NT, ND, BS+ Musculoskeletal: no deformities, strength intact in all 4 Skin: moist, warm, no rashes Neurological: no tremor with outstretched hands, DTR normal in all 4  ASSESSMENT: 1. DM2, non-insulin-dependent, uncontrolled, without complications  She has been on Invokana 300 in the past, however she had a potassium of 5.8 >> Invokana was stopped. We have tried glipizide after stopping Invokana, however, she developed lows and we had to stop it.   2.  Hypothyroidism   PLAN:  1. Patient with well controlled diabetes, on oral antidiabetic regimen, with metformin + Januvia at last visit.  Sugars are at goal Patient Instructions  Please continue: - Metformin 1000 mg 2x a day with meals - Januvia 100 mg in am  Please come back for a follow-up appointment in 4 months.  - continue checking sugars at different times of the day - check 1x a day, rotating checks - UTD with eye exam  - Will check a fructosamine level  - Return to clinic in 3 mo with sugar log.    2.  Hypothyroidism - pt would want me to follow her for this -  Reviewed last TSH >> 6 on LT4 125 mcg daily >> we increased her LT4 dose to 137 mcg daily at last visit - will check TFTs today -  we again discussed about correct administration of levothyroxine and I advised her to take the thyroid hormone every day, with water, >30 minutes before breakfast, separated by >4 hours from acid reflux medications, calcium, iron, multivitamins. She denies  missing doses anymore.  Patient Instructions  Please continue Levothyroxine 137 mcg daily.  Take the thyroid hormone every day, with water, >30 minutes before breakfast, separated by >4 hours from acid reflux medications, calcium, iron, multivitamins.  Please come back for a follow-up appointment in 4 months.  POC HbA1c checked by mistake before appt: Orders Only on 08/21/2015  Component Date Value Ref Range Status  . Hemoglobin A1C 08/21/2015 9.1   Final  Office Visit on 08/21/2015  Component Date Value Ref Range Status  . Fructosamine 08/21/2015 310* 190 - 270 umol/L Final  . Free T4 08/21/2015 1.19  0.60 - 1.60 ng/dL Final  . TSH 08/21/2015 2.38  0.35 - 4.50 uIU/mL Final   The calculated HbA1c is exactly the same as before: 6.88%!  Normal  TFTs.

## 2015-08-24 LAB — FRUCTOSAMINE: FRUCTOSAMINE: 310 umol/L — AB (ref 190–270)

## 2015-08-25 ENCOUNTER — Ambulatory Visit: Payer: Medicare Other

## 2015-08-31 ENCOUNTER — Other Ambulatory Visit: Payer: Self-pay | Admitting: Internal Medicine

## 2015-09-01 ENCOUNTER — Other Ambulatory Visit: Payer: Self-pay | Admitting: Internal Medicine

## 2015-09-01 NOTE — Telephone Encounter (Signed)
Pt is requesting a refill on Ativan. Pt last OV 05/29/15, last filled 05/29/15 #[id number] with 1refill. Please advise thanks

## 2015-09-09 ENCOUNTER — Telehealth: Payer: Self-pay | Admitting: Internal Medicine

## 2015-09-09 NOTE — Telephone Encounter (Signed)
Pt called to sch for the AWV. Call pt @ [phone]. Thank you!

## 2015-09-21 ENCOUNTER — Ambulatory Visit: Payer: Medicare Other

## 2015-10-02 ENCOUNTER — Other Ambulatory Visit: Payer: Self-pay | Admitting: Internal Medicine

## 2015-10-27 ENCOUNTER — Other Ambulatory Visit: Payer: Self-pay | Admitting: Internal Medicine

## 2015-11-03 ENCOUNTER — Other Ambulatory Visit: Payer: Self-pay | Admitting: Internal Medicine

## 2015-11-04 NOTE — Telephone Encounter (Signed)
Lorazepam refill request.  Last seen 05/29/15.  Last filled 09/01/15.  Please advise.

## 2015-11-04 NOTE — Telephone Encounter (Signed)
Rx faxed to pharmacy  

## 2015-11-17 DIAGNOSIS — D2271 Melanocytic nevi of right lower limb, including hip: Secondary | ICD-10-CM | POA: Diagnosis not present

## 2015-11-17 DIAGNOSIS — D2261 Melanocytic nevi of right upper limb, including shoulder: Secondary | ICD-10-CM | POA: Diagnosis not present

## 2015-11-17 DIAGNOSIS — D2272 Melanocytic nevi of left lower limb, including hip: Secondary | ICD-10-CM | POA: Diagnosis not present

## 2015-11-17 DIAGNOSIS — D485 Neoplasm of uncertain behavior of skin: Secondary | ICD-10-CM | POA: Diagnosis not present

## 2015-11-17 DIAGNOSIS — C4442 Squamous cell carcinoma of skin of scalp and neck: Secondary | ICD-10-CM | POA: Diagnosis not present

## 2015-11-17 DIAGNOSIS — D2262 Melanocytic nevi of left upper limb, including shoulder: Secondary | ICD-10-CM | POA: Diagnosis not present

## 2015-11-18 DIAGNOSIS — E784 Other hyperlipidemia: Secondary | ICD-10-CM | POA: Diagnosis not present

## 2015-11-18 DIAGNOSIS — I371 Nonrheumatic pulmonary valve insufficiency: Secondary | ICD-10-CM | POA: Diagnosis not present

## 2015-11-18 DIAGNOSIS — Q256 Stenosis of pulmonary artery: Secondary | ICD-10-CM | POA: Diagnosis not present

## 2015-11-18 DIAGNOSIS — Q221 Congenital pulmonary valve stenosis: Secondary | ICD-10-CM | POA: Diagnosis not present

## 2015-11-18 DIAGNOSIS — I10 Essential (primary) hypertension: Secondary | ICD-10-CM | POA: Diagnosis not present

## 2015-12-11 ENCOUNTER — Ambulatory Visit: Payer: Medicare Other | Admitting: Internal Medicine

## 2015-12-24 DIAGNOSIS — C4442 Squamous cell carcinoma of skin of scalp and neck: Secondary | ICD-10-CM | POA: Diagnosis not present

## 2016-01-02 ENCOUNTER — Other Ambulatory Visit: Payer: Self-pay | Admitting: Internal Medicine

## 2016-01-04 NOTE — Telephone Encounter (Signed)
Refill request for Ativan, last seen [id number], last filled [id number].  Please advise.

## 2016-01-25 ENCOUNTER — Encounter: Payer: Self-pay | Admitting: Internal Medicine

## 2016-01-25 ENCOUNTER — Telehealth: Payer: Self-pay | Admitting: Internal Medicine

## 2016-01-25 ENCOUNTER — Telehealth: Payer: Self-pay

## 2016-01-25 ENCOUNTER — Ambulatory Visit (INDEPENDENT_AMBULATORY_CARE_PROVIDER_SITE_OTHER): Payer: Medicare Other | Admitting: Internal Medicine

## 2016-01-25 VITALS — BP 110/82 | HR 96 | Ht 61.5 in | Wt 153.0 lb

## 2016-01-25 DIAGNOSIS — E1165 Type 2 diabetes mellitus with hyperglycemia: Secondary | ICD-10-CM | POA: Diagnosis not present

## 2016-01-25 DIAGNOSIS — E039 Hypothyroidism, unspecified: Secondary | ICD-10-CM | POA: Diagnosis not present

## 2016-01-25 MED ORDER — DAPAGLIFLOZIN PROPANEDIOL 5 MG PO TABS: 5.0000 mg | ORAL_TABLET | Freq: Every day | ORAL | Status: DC

## 2016-01-25 NOTE — Patient Instructions (Addendum)
Please continue: - Metformin 1000 mg 2x a day - Januvia 100 mg in am  Add: - Farxiga 5 mg in am  Please return in  3 months with your sugar log.    Cardiac MRI velocity flow mapping5/04/2016  [place]  Component Name Value Range  LV Ejection Fraction (%) 56 %  Cardiac Index (l/min/m2) 2 L/min/m2  Left Atrium Diameter (cm) 3.2 cm  LV End Diastolic Diameter (cm) 4.6 cm  LV End Systolic Diameter (cm) 2.8 cm  LV Septum Wall Thickness (cm) 0.6 cm  LV Posterior Wall Thickness (cm) 0.4 cm  LV Infarct/Scar Size Percentage (%) 1 %  Tricuspid Valve Regurgitation Grade mild   Mitral Valve Regurgitation Grade mild   Mitral Valve Stenosis Grade none   Aortic Valve Regurgitation Grade mild   Aortic Valve Stenosis Grade none   Aortic Valve Max Velocity (m/s) 0.013 m/s   Result Narrative                                                      [facility]                                                   CMR Report    MRN:                      [id number]                                 Name:               Haley Young, Haley Young                                  DOB:                  [birth year]                                 Scan Date:   2015-11-18 08:30:20                                  Signed by Nevin Bloodgood, M.D. (uid:[id number]) 2017-May-10 13:43:06.  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 The study is compared to the patient's prior cardiac MRI/MRA of 11/27/2012.  The exam was limited by the patient's exaggerated vagal response to  breath-holds, which caused a variable R-R interval and resultant degradation in image quality.     Cardiac MRI.  1. The left ventricle is normal in size and wall thickness. There is diastolic flattening of the LV consistent with  right ventricular volume overload. Global left ventricular systolic function is normal. The quantitative left ventricular ejection fraction is 55%.  2. Rght ventricular volumes are normal. Global right ventricular systolic function is normal with a quantitative right ventricular ejection fraction is 58% (previously 69%). There is RV hypertrophy. RV volumes with absolute volumes and indexed to BSA by age decile with mean and 95% CI are listed below:  RVEDV: 116 ml (106 ml (117, 75-160) RVESV:49 ml  (33 ml (37, 11-63) RVEDVI: 71 ml/m2 (62 ml/m2 (68, 49-86) RVESVI: 30 ml/m2 (19 ml/m2 (21, 8-34)  3. The right and left atrium are normal in size.  4. The aortic valve is trileaflet. There is mild aortic regurgitation. The peak transaortic velocity by velocity flow mapping is 1.4 m/s (previously 1.3 m/s).  5. There is a bio-prosthetic pulmonic valve that appears to be well seated. There is normal opening of the leaflets, assessment of morphology is limited due to metal artifact. The peak velocity across the valve is 2.2 m/s (2.4 m/s on the prior study). There is moderate-severe pulmonic regurgitation with a regurgitant jet is directed anteriorly along the RV free wall. The regurgitant volume is 35 ml (previously 29 ml) . Regurgitant fraction was unable to be determined on todays study given motion artifact during systole and an inaccurate forward volume measurement.   6. There is mild mitral and tricuspid regurgitation.  7. Delayed enhancement imaging for viability is mildly abnormal. There is focal hyperenhancement of the basal RV insertion site into the left ventricle (previously seen). There is also mild hyperenhancement of the basal RV (best seen in series 149).  This is a non-specific pattern commonly seen in patient with chronically elevated RV pressures.  Chest MRA.  1. 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 The left branch pulmonary artery measures 2.6 x 2.3 cm (2.5 x 2.3 cm).  2. The thoracic aorta is normal in dimensions, there is no aneurysm, coarctation, or dissection flap seen.  3. Normal systemic and pulmonary venous connections.  FINAL IMPRESSION: Moderate severe pulmonic insufficiency. Since the last MR, there is possibly mild reduction in RV systolic function (albeit still within normal range) and with mild increase in pulmonic regurgitant volume. However, RV volumes and function is still within normal range.   CORE EXAM  ==========================================================================================================  MEASUREMENTS  ----------------------------------------------------------------------------------------     VOLUMETRIC ANALYSIS      ---------------------------------------------- .----------------------------------------------------------.                   LV     Reference  RV     Reference  +------+-----------+-------+-----------+-------+-----------+  EDV   ml            87   (82-162)  115.7   (75-160)         ml/m^2        53   (53-87)    70.5   (49-86)    ESV   ml          38.7   (20-57)    48.7   (11-63)          ml/m^2      23.6   (13-31)    29.7   (8-34)     CO    L/min       3.28  4.56                    L/min/m^2      2               2.8              MASS                                                  SV    ml          48.3   (56-111)     67   (55-106)         ml/m^2      29.5   (36-60)    40.9   (34-58)    EF    %          55.52   (60-78)   57.91   (57-81)    '------+-----------+-------+-----------+-------+-----------'          HEART RATE:  68      LV DIMENSIONS      ----------------------------------------------         WALL THICKNESS - ANTEROSEPTAL:  0.6 cm         WALL THICKNESS - INFEROLATERAL:  0.4 cm         WALL THICKNESS - MAXIMUM:  0.7 cm         LV EDD:  4.6 cm         LV ESD:  2.8 cm      LA DIMENSIONS (LV SYSTOLE)      ----------------------------------------------         DIAMETER:  3.2 cm         AREA - 4 CHAMBER:  10 cm^2         LENGTH - 4 CHAMBER:  3.8 cm      AORTIC ROOT DIMENSIONS      ----------------------------------------------         DIAMETER - SINUS OF VALSALVA:  2.3 cm         AORTIC ROOT SIZE:  Normal       VISUAL      ----------------------------------------------         EJECTION FRACTION:  55 %   ANATOMY  ----------------------------------------------------------------------------------------     LEFT VENTRICLE      ----------------------------------------------         WALL THICKNESS:  Normal          CAVITY SIZE:  Normal          MASS/THROMBUS:  None       RIGHT VENTRICLE      ----------------------------------------------         WALL THICKNESS:  MODERATE HYPERTROPHY          CONTRACTILITY:  Normal          CAVITY SIZE:  Normal          MASS/THROMBUS:  None          PACEMAKER/DEFIBRILLATOR WIRE:  No       INTERVENTRICULAR SEPTUM      ---------------------------------------------- DIASTOLIC FLATTENING (RV VOLUME OVERLOAD)       LEFT ATRIUM      ----------------------------------------------         CAVITY SIZE:  Normal          MASS/THROMBUS:  None  RIGHT ATRIUM      ----------------------------------------------         CAVITY SIZE:  Normal          MASS/THROMBUS:  None       PERICARDIUM      ----------------------------------------------         Normal          EFFUSION:  None       PLEURAL EFFUSION      ---------------------------------------------- None     VALVES  ----------------------------------------------------------------------------------------     AORTIC VALVE      ----------------------------------------------         AORTIC VALVE LEAFLETS:  Trileaflet          AORTIC STENOSIS:  None          AORTIC REGURGITATION:  MILD          PEAK AORTIC VALVE VELOCITY:  1.3 cm/sec         PEAK AORTIC VALVE GRADIENT:  0 mmHg      MITRAL VALVE      ----------------------------------------------         MITRAL VALVE LEAFLETS:  Normal Leaflets          MITRAL STENOSIS:  None          MITRAL REGURGITATION:  MILD       TRICUSPID VALVE      ----------------------------------------------         TRICUSPID VALVE LEAFLETS:  Normal Leaflets          TRICUSPID STENOSIS:  None          TRICUSPID REGURGITATION:  MILD       PULMONIC VALVE      ----------------------------------------------         PULMONIC VALVE LEAFLETS:  BIOPROSTHETIC          PULMONIC STENOSIS:  None          PULMONIC REGURGITATION:  MODERATE-SEVERE          PROSTHETIC PULMONIC VALVE:  Well Seated          PULMONIC REGURGITANT VOLUME:  35 ml   17 SEGMENT  ---------------------------------------------------------------------------------------- .------------------------------------------------------------------------------------------.  Segments            Wall Motion   Hyperenhancement  Stress Perfusion  Interpretation  +--------------------+--------------+------------------+------------------+----------------+  Base Anterior       Normal/Hyper  None                                Normal           Base Anteroseptal   Normal/Hyper  None                                Normal           Base Inferoseptal   Normal/Hyper  1-25%                               Non-CAD Scar     Base Inferior       Normal/Hyper  None                                Normal           Base Inferolateral  Normal/Hyper  None  Normal            Base Anterolateral  Normal/Hyper  None                                Normal           Mid Anterior        Normal/Hyper  None                                Normal           Mid Anteroseptal    Normal/Hyper  None                                Normal           Mid Inferoseptal    Normal/Hyper  None                                Normal           Mid Inferior        Normal/Hyper  None                                Normal           Mid Inferolateral   Normal/Hyper  None                                Normal           Mid Anterolateral   Normal/Hyper  None                                Normal           Apical Anterior     Normal/Hyper  None                                Normal           Apical Septal       Normal/Hyper  None                                Normal           Apical Inferior     Normal/Hyper  None                                Normal           Apical Lateral      Normal/Hyper  None                                Normal           Apex                Normal/Hyper  None  Normal          +--------------------+--------------+------------------+------------------+----------------+  RV Segments         Wall Motion   Hyperenhancement  Stress Perfusion  Interpretation  +--------------------+--------------+------------------+------------------+----------------+  RV Basal Anterior   Normal/Hyper  1-25%                               Scar             RV Basal Inferior   Normal/Hyper  1-25%                               Scar             RV Mid              Normal/Hyper  None                                Normal           RV Apical           Normal/Hyper  None                                Normal          '--------------------+--------------+------------------+------------------+----------------'      FINDINGS       ----------------------------------------------         INFARCT/SCAR SIZE:  1 %   VASCULAR  ==========================================================================================================   SCAN INFO  ==========================================================================================================  GENERAL  ----------------------------------------------------------------------------------------     SEDATION      ----------------------------------------------         SEDATION USED?:  No       CONTRAST AGENT      ----------------------------------------------         TYPE:  Dotarem          VOLUME ADMINISTERED:  20 ml         DOSAGE FOR 0.58M:  0.16 mmol/kg         SERUM CREATININE:  0.9 sCr         GFR:  66.38 ml/min/1.57m^2         FEMALE:  Yes          AFRICAN AMERICAN OR BLACK:  No          CREATININE DATE:  2012-11-27 00:00:00       VITALS      ----------------------------------------------         HEIGHT:  62.01 in         HEIGHT:  157.51 cm         BODY WEIGHT:  139.99 lbs         BODY WEIGHT:  63.5 kgs         BSA::  1.64 m^2         SYSTOLIC BP:  123456 mmHg         DIASTOLIC BP:  56 mmHg         HEART RATE:  82 BPM         HEART RHYTHM:  Sinus Rhythm       PULSE SEQUENCE      ----------------------------------------------         PULSE SEQUENCES:  Single-Shot SSFP, IR SSFP - Single Shot, Single Shot BB TSE, SSFP  Cine, Phase         Contrast Velocity Mapping, 3D MRA w and w/o contrast       SETUP      ----------------------------------------------         TYPE:  Both          INPATIENT:  No          INCOMPLETE SCAN:  No          REASON(S) FOR SCAN:  Vascular Disease, Congenital Heart Disease, Valve Assessment          REFERRING PHYSICIAN:  Joyice Faster, MD          ATTENDING PHYSICIAN:  Nevin Bloodgood, M.D.          TECHNOLOGIST:  Allyson Sabal    BILLING   ----------------------------------------------------------------------------------------     Patient Account         [id number]     CPT Codes         [phone], [ , [id number], [ , [id number]     [place]         [phone]     ICD10 Codes         I37.1, [ , ]Q22.1, [ , ]Q25.6

## 2016-01-25 NOTE — Progress Notes (Signed)
Patient ID: RAYNIAH BENDIXEN, female   DOB: 1950/11/02, 65 y.o.   MRN: [id number]  HPI: KATHERLEEN IMAMURA is a 65 y.o.-year-old female, returning for f/u for DM2, dx 2010, non-insulin-dependent, uncontrolled, without complications. Last visit 5 mo ago.  Last hemoglobin A1c: 08/21/2015: HbA1c calculated from fructosamine is 6.88%  Lab Results  Component Value Date   HGBA1C 9.1 08/21/2015   HGBA1C 7.5* 03/12/2015   HGBA1C 7.9* 09/09/2014  12/18/2014: HbA1c calculated from fructosamine is 6.88% 09/10/2014: HbA1c calculated from fructosamine is 6.9% 03/19/2014:  HbA1c calculated from fructosamine is 6.57%  Pt is on a regimen of: - Metformin 1000 mg po bid >> tolerates it well - Januvia 100 mg daily in am We started Glipizide as we had to stop Invokana >> tremors >> stopped She was on Invokana 300 mg daily >> stopped 2/2 high potassium She was on Invokana 300 mg daily - added 08/2013  - costs her 300$ >> at last visit I gave her a coupon for InvokaMet 2 tablets in am ([phone] mg) daily in am and continued Metformin 1000 mg with dinner. She tells she can get the 3 mo supply for 207%$ Tried insulin Lantus and Novolog - ~2011 Tried Cinnamon and vinegar.  Pt checks sugars 2x a day lately: - am: 136-177 >> 110-141 >> 88-130s >> 100-130 >> 80-115 >> 117-147, 160 >> 109-134, 165 >> 110-115 >> 114-134 - 2h after b'fast: n/c >> 131-141 - before lunch: n/c >> 63-139 >> n/c >> 127-141 - 2h after lunch: n/c >> 78, 137 >> n/c >> 102-132 >> n/c >> 133-163, 178, 283 - before dinner: 140-150s >> n/c >> 103, 120 >> n/c>> 76-102 >> 128 >> 119-152 >> 100-140 >> 137-146, 162 - 2h after dinner: 109 >> 126-182 >> 134-184 >> 158-194, 217 - bedtime: 126-170, few >190-220 >> n/c >> 113-219 >> 127-166, 186 >> 120-160, 180x2 (icecream) >>134-168, 231 - nighttime: n/c >> 138-240, most 138-170 >> n/c >> 129-160 >> n/c Unclear how high or how low she can get or if she has hypoglycemia awareness.   Pt's meals are: -  Breakfast: banana + PB or cereals; coffee + sweetener. - Lunch: yoghurt + peaches, ham sandwich; soup - Dinner: baked chicken; peas; salad Less sweets; more fruit. Does not drink soft drinks.   She exercises 2-3x a week by walking - 30 min.   - no CKD, last BUN/creatinine:  Lab Results  Component Value Date   BUN 25* 03/12/2015   CREATININE 1.01 03/12/2015  03/12/2015: ACR 1.3 - last set of lipids: Lab Results  Component Value Date   CHOL 152 03/12/2015   HDL 48.20 03/12/2015   LDLCALC 88 03/12/2015   LDLDIRECT 171.3 10/31/2011   TRIG 79.0 03/12/2015   CHOLHDL 3 03/12/2015  On Lipitor. - last eye exam was in 10/13/2014. No DR.  - no numbness and tingling in her feet.  She saw cardiology at [facility] (Dr. Leonides Schanz) for fatigue and SOB >> EKG stress test >> several presumed blockages. She had a catheterization >> no blockages. She has the same % of pulm valve insufficiency: 40%, but had a new cardiac MRI >> % could not be calculated.  She also has hypothyroidism. Last TSH: Lab Results  Component Value Date   TSH 2.38 08/21/2015   TSH 6.32* 03/12/2015   TSH 10.17* 12/16/2014   TSH 7.08* 09/09/2014   TSH 3.06 09/13/2013   TSH 4.82 06/28/2012   TSH 6.72* 03/29/2012   FREET4 1.19 08/21/2015   FREET4  0.86 03/12/2015   FREET4 0.71 12/16/2014   FREET4 0.75 03/05/2010   FREET4 0.7 12/08/2009   She is on LT4 137 mcg daily: - no skipped doses - in am - with water - separated by >30 min - no PPI, calcium, Fe, MVI  I reviewed pt's medications, allergies, PMH, social hx, family hx, and changes were documented in the history of present illness. Otherwise, unchanged from my initial visit note.   ROS: .Constitutional: + weight gain, + fatigue, no subjective hyperthermia/hypothermia Eyes: no blurry vision, no xerophthalmia ENT: no sore throat, no nodules palpated in throat, no dysphagia/odynophagia, no hoarseness Cardiovascular: no CP/+ SOB/no palpitations/leg swelling Respiratory: no  cough/+ SOB Gastrointestinal: no N/V/D/C/+ Heartburn Musculoskeletal: no muscle/joint aches Skin: no rashes  PE: BP 110/82 mmHg  Pulse 96  Ht 5' 1.5" (1.562 m)  Wt 153 lb (69.4 kg)  BMI 28.44 kg/m2  SpO2 98% Body mass index is 28.44 kg/(m^2).  Wt Readings from Last 3 Encounters:  01/25/16 153 lb (69.4 kg)  08/21/15 143 lb 9.6 oz (65.137 kg)  03/18/15 144 lb (65.318 kg)   Constitutional: overweight, in NAD Eyes: PERRLA, EOMI, no exophthalmos ENT: moist mucous membranes, no thyromegaly, no cervical lymphadenopathy Cardiovascular: tachycardia, RR, + 3/6 SEM, no RG Respiratory: CTA B Gastrointestinal: abdomen soft, NT, ND, BS+ Musculoskeletal: no deformities, strength intact in all 4 Skin: moist, warm, no rashes Neurological: no tremor with outstretched hands, DTR normal in all 4  ASSESSMENT: 1. DM2, non-insulin-dependent, uncontrolled, without complications  She has been on Invokana 300 in the past, however she had a potassium of 5.8 >> Invokana was stopped. We have tried glipizide after stopping Invokana, however, she developed lows and we had to stop it.   2.  Hypothyroidism   PLAN:  1. Patient with well controlled diabetes, on oral antidiabetic regimen, with metformin + Januvia at last visit.  Sugars are higher. She is interested in [place], which is a great idea for her, provided she stays hydrated very well.  This is much less powerful than Invokana (on which she developed hyperkalemia), however, I do plan to recheck her potassium when she comes back. - She gained 10 pounds since last visit, which worsened her diabetes, and could've contributed to her shortness of breath. She tells me that she did not change her diet significantly. Wilder Glade, will help with weight loss also. - I advised her to: Patient Instructions   Please continue: - Metformin 1000 mg 2x a day - Januvia 100 mg in am  Add: - Farxiga 5 mg in am  Please return in  3 months with your sugar log.   -  continue checking sugars at different times of the day - check 1x a day, rotating checks - She needs a new eye exam  - Will check a fructosamine level  - Return to clinic in 3 mo with sugar log.    2.  Hypothyroidism  - Reviewed normal on LT4 137 mcg daily at last check 2 mo ago. - will check TFTs today as she has SOB and tachycardia -  we again discussed about correct administration of levothyroxine and I advised her to take the thyroid hormone every day, with water, >30 minutes before breakfast, separated by >4 hours from acid reflux medications, calcium, iron, multivitamins. She denies missing doses anymore.  Component     Latest Ref Rng 01/25/2016  TSH     0.35 - 4.50 uIU/mL 0.37  Fructosamine     0 - 285 umol/L  279  T4,Free(Direct)     0.60 - 1.60 ng/dL 1.71 (H)   HbA1c calculated from fructosamine: is 6.35% - excellent. Will Jardiance Wilder Glade not covered) to help alleviate the blood sugar spikes.  TSH is low in the normal range and free T4 is high. I will decrease her levothyroxine to 125 g and recheck her TFTs at next visit.

## 2016-01-25 NOTE — Telephone Encounter (Signed)
PT stated she had discussed taking Jardience this morning with Dr. Cruzita Lederer, Wilder Glade was called in and she wants to know why.  Please advise.

## 2016-01-25 NOTE — Telephone Encounter (Signed)
Patient called and stated that the wrong medication was sent to pharmacy, spoke with MD and checked noted. Dr.Gherghe wants to try the [place] first, and then if insurance does not pay we will go to [place]. Advised patient to go pick up the medication from pharmacy and start taking that, before we send in [place]. Advised patient to call back if she had any questions.

## 2016-01-26 ENCOUNTER — Telehealth: Payer: Self-pay | Admitting: Internal Medicine

## 2016-01-26 ENCOUNTER — Other Ambulatory Visit: Payer: Self-pay

## 2016-01-26 DIAGNOSIS — B0052 Herpesviral keratitis: Secondary | ICD-10-CM | POA: Diagnosis not present

## 2016-01-26 LAB — FRUCTOSAMINE: FRUCTOSAMINE: 279 umol/L (ref 0–285)

## 2016-01-26 LAB — TSH: TSH: 0.37 u[IU]/mL (ref 0.35–4.50)

## 2016-01-26 LAB — T4, FREE: Free T4: 1.71 ng/dL — ABNORMAL HIGH (ref 0.60–1.60)

## 2016-01-26 MED ORDER — EMPAGLIFLOZIN 10 MG PO TABS: 10.0000 mg | ORAL_TABLET | Freq: Every day | ORAL | Status: DC

## 2016-01-26 MED ORDER — LEVOTHYROXINE SODIUM 125 MCG PO TABS: 125.0000 ug | ORAL_TABLET | Freq: Every day | ORAL | Status: DC

## 2016-01-26 NOTE — Telephone Encounter (Signed)
PT said she called [employer] and found out that Haley Young is the cheaper option and would like that to be called into the pharmacy.

## 2016-01-26 NOTE — Telephone Encounter (Signed)
Patient stated she called insurance and they would cover Jardiance for less than the [place]. I discontinued the [place] and ordered the Jardiance, I called patient to notify her, no other questions or concerns from patient.

## 2016-01-27 DIAGNOSIS — H00016 Hordeolum externum left eye, unspecified eyelid: Secondary | ICD-10-CM | POA: Diagnosis not present

## 2016-01-28 ENCOUNTER — Telehealth: Payer: Self-pay | Admitting: *Deleted

## 2016-01-28 NOTE — Telephone Encounter (Signed)
Please advise thanks.

## 2016-01-28 NOTE — Telephone Encounter (Signed)
Yes, I did. 

## 2016-01-28 NOTE — Telephone Encounter (Signed)
Patient requested to become a pt of Dr Derrel Nip along with her son and daughter. She stated she talked to Dr. Derrel Nip in the grocery store on yesterday and Dr. Derrel Nip accepted her and children as new patients, please advise

## 2016-01-28 NOTE — Telephone Encounter (Signed)
Please advise you can call and schedule as new patients per Dr. Derrel Nip, thanks

## 2016-02-05 ENCOUNTER — Telehealth: Payer: Self-pay | Admitting: *Deleted

## 2016-02-05 NOTE — Telephone Encounter (Signed)
Noted thanks,

## 2016-02-05 NOTE — Telephone Encounter (Signed)
Patient called and states she was contacted by our office about a lab that was missing from her chart. She states that they told her it was the A1C. Dr. Cruzita Lederer doesn't order a A1C lab she ordered a Fructosamine lab on her . Pt is just wanting to let a RN know that she never ordered a A1C. Thanks

## 2016-02-11 ENCOUNTER — Other Ambulatory Visit: Payer: Self-pay | Admitting: Internal Medicine

## 2016-03-03 ENCOUNTER — Other Ambulatory Visit: Payer: Self-pay | Admitting: Internal Medicine

## 2016-03-04 NOTE — Telephone Encounter (Signed)
Refilled  for 30 days only.  OFFICE VISIT NEEDED prior to any more refills. Pldsae call patient and schedule

## 2016-03-04 NOTE — Telephone Encounter (Signed)
Pt last saw Dr. Caryl Bis in 05/2015. Pt has not been seen since. Pt said she was going to become a pt under Dr. Derrel Nip, but has not scheduled an OV. Please advise on refill. Last refilled 02/11/16.

## 2016-03-05 ENCOUNTER — Encounter: Payer: Self-pay | Admitting: Internal Medicine

## 2016-03-07 ENCOUNTER — Telehealth: Payer: Self-pay | Admitting: Internal Medicine

## 2016-03-07 ENCOUNTER — Other Ambulatory Visit: Payer: Self-pay

## 2016-03-07 NOTE — Telephone Encounter (Signed)
Patient was scheduled by [facility] staff for appt. thanks

## 2016-03-07 NOTE — Telephone Encounter (Signed)
Ms. Ferone returned Tanya's call. She said she couldn't get to the phone fast enough when it was ringing.  Pt's ph# [phone] Thank you.

## 2016-03-07 NOTE — Telephone Encounter (Signed)
Please advise, thanks See additional note on Son that was sent to you. thanks

## 2016-03-07 NOTE — Telephone Encounter (Signed)
Haley Young called saying she saw Dr. Derrel Nip in a grocery store (while talking about ice cream) and Dr. Derrel Nip told her she'd take her on as a new patient in addition to her son who has Asberger's syndrome and her daughter who has Down Syndrome. She wants her daughter seen first. I informed her Dr. Derrel Nip may not have anything until October 5th. Please give Haley Young a phone call regarding appointments.   Pt's ph# [phone] Thank you.

## 2016-03-08 NOTE — Telephone Encounter (Signed)
Please schedule patient an appointment for medication refills. October.

## 2016-03-15 ENCOUNTER — Other Ambulatory Visit: Payer: Self-pay

## 2016-03-15 NOTE — Telephone Encounter (Signed)
Please advise, new patient for you, last refilled on 01/04/16 #60 no refills, thanks

## 2016-03-17 MED ORDER — LORAZEPAM 1 MG PO TABS: 1.0000 mg | ORAL_TABLET | Freq: Two times a day (BID) | ORAL | 0 refills | Status: DC | PRN

## 2016-03-17 NOTE — Telephone Encounter (Signed)
Noted and faxed thanks

## 2016-03-17 NOTE — Telephone Encounter (Signed)
Authorized since I have met her before,  Will refill until first appt oct 5

## 2016-03-21 ENCOUNTER — Telehealth: Payer: Self-pay

## 2016-03-21 NOTE — Telephone Encounter (Signed)
Called patient to make sure she was aware of her appointment date, as patient had no read the message on mychart yet. Patient wrote down appointment date and had no other questions at this time.

## 2016-04-14 ENCOUNTER — Encounter: Payer: Self-pay | Admitting: Internal Medicine

## 2016-04-14 ENCOUNTER — Ambulatory Visit (INDEPENDENT_AMBULATORY_CARE_PROVIDER_SITE_OTHER): Payer: Medicare Other | Admitting: Internal Medicine

## 2016-04-14 VITALS — BP 130/84 | HR 87 | Temp 97.6°F | Resp 12 | Ht 62.0 in | Wt 141.2 lb

## 2016-04-14 DIAGNOSIS — R5383 Other fatigue: Secondary | ICD-10-CM | POA: Diagnosis not present

## 2016-04-14 DIAGNOSIS — E559 Vitamin D deficiency, unspecified: Secondary | ICD-10-CM

## 2016-04-14 DIAGNOSIS — E039 Hypothyroidism, unspecified: Secondary | ICD-10-CM | POA: Diagnosis not present

## 2016-04-14 DIAGNOSIS — Z23 Encounter for immunization: Secondary | ICD-10-CM | POA: Diagnosis not present

## 2016-04-14 DIAGNOSIS — E1169 Type 2 diabetes mellitus with other specified complication: Secondary | ICD-10-CM | POA: Diagnosis not present

## 2016-04-14 DIAGNOSIS — E119 Type 2 diabetes mellitus without complications: Secondary | ICD-10-CM | POA: Diagnosis not present

## 2016-04-14 DIAGNOSIS — E785 Hyperlipidemia, unspecified: Secondary | ICD-10-CM

## 2016-04-14 DIAGNOSIS — F5104 Psychophysiologic insomnia: Secondary | ICD-10-CM

## 2016-04-14 LAB — COMPREHENSIVE METABOLIC PANEL
ALBUMIN: 4.3 g/dL (ref 3.5–5.2)
ALT: 20 U/L (ref 0–35)
AST: 23 U/L (ref 0–37)
Alkaline Phosphatase: 73 U/L (ref 39–117)
BUN: 23 mg/dL (ref 6–23)
CALCIUM: 9.8 mg/dL (ref 8.4–10.5)
CHLORIDE: 97 meq/L (ref 96–112)
CO2: 26 mEq/L (ref 19–32)
CREATININE: 0.9 mg/dL (ref 0.40–1.20)
GFR: 66.23 mL/min (ref >60.00)
Glucose, Bld: 140 mg/dL — ABNORMAL HIGH (ref 70–99)
POTASSIUM: 5.5 meq/L — AB (ref 3.5–5.1)
Sodium: 132 mEq/L — ABNORMAL LOW (ref 135–145)
Total Bilirubin: 0.5 mg/dL (ref 0.2–1.2)
Total Protein: 7.9 g/dL (ref 6.0–8.3)

## 2016-04-14 LAB — LIPID PANEL
CHOLESTEROL: 239 mg/dL — AB (ref 0–200)
HDL: 57.9 mg/dL (ref >39.00)
LDL Cholesterol: 149 mg/dL — ABNORMAL HIGH (ref 0–99)
NONHDL: 181.36
Total CHOL/HDL Ratio: 4
Triglycerides: 162 mg/dL — ABNORMAL HIGH (ref 0.0–149.0)
VLDL: 32.4 mg/dL (ref 0.0–40.0)

## 2016-04-14 LAB — TSH: TSH: 1.98 u[IU]/mL (ref 0.35–4.50)

## 2016-04-14 LAB — MICROALBUMIN / CREATININE URINE RATIO
Creatinine,U: 74 mg/dL
MICROALB UR: 3.9 mg/dL — AB (ref 0.0–1.9)
MICROALB/CREAT RATIO: 5.3 mg/g (ref 0.0–30.0)

## 2016-04-14 LAB — VITAMIN D 25 HYDROXY (VIT D DEFICIENCY, FRACTURES): VITD: 30.95 ng/mL (ref 30.00–100.00)

## 2016-04-14 LAB — VITAMIN B12: VITAMIN B 12: 235 pg/mL (ref 211–911)

## 2016-04-14 LAB — LDL CHOLESTEROL, DIRECT: LDL DIRECT: 165 mg/dL

## 2016-04-14 MED ORDER — TETANUS-DIPHTH-ACELL PERTUSSIS 5-2.5-18.5 LF-MCG/0.5 IM SUSP: 0.5000 mL | Freq: Once | INTRAMUSCULAR | 0 refills | Status: AC

## 2016-04-14 MED ORDER — TRAZODONE HCL 150 MG PO TABS: 150.0000 mg | ORAL_TABLET | Freq: Every day | ORAL | 1 refills | Status: DC

## 2016-04-14 NOTE — Progress Notes (Signed)
Pre-visit discussion using our clinic review tool. No additional management support is needed unless otherwise documented below in the visit note.  

## 2016-04-14 NOTE — Progress Notes (Signed)
Subjective:  Patient ID: Haley Young, female    DOB: 28-Aug-1950  Age: 65 y.o. MRN: [id number]  CC: The primary encounter diagnosis was Hypothyroidism, unspecified type. Diagnoses of Hyperlipidemia associated with type 2 diabetes mellitus ([place]), Fatigue, unspecified type, Vitamin D deficiency, Diabetes mellitus without complication ([place]), Encounter for immunization, and Psychophysiological insomnia were also pertinent to this visit.  HPI Haley Young presents for transition of care from Dr Gilford Rile  Last seen Nov 2016.  A1c uncontrolled Feb 2017 managed by Dr Renne Crigler next app in October.  Cc: "I feel drained"  Doesn't work,  No schedule,  House is a mess .  Not sleeping well waking up at 4 am and lying there till 7 am .  Goes to bed  at 10 pm.  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    Out of bed at 7 am ,  Lies around on the couch till 10 am.  Her adopted Son Mali as Aspberger's. Lives independently,  Works at [employer] in [employer]  Has to take him around,  As well as his helper  who is a double amputee and helps him manage his household.  Her natural child Cyril Mourning has Down's syndrome,  Lives in a group home.   Needs mammogram  S/p hysterectomy Polyps 5  2009 [place]   Follow yup?    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    Mother died of pancreatic CA  2 days after diagnosis.  Led a sedentary life after retirement   Brother passed away 3 years later at age 70 pf PNA.  Had Down's as well. (his mother was 27)      Lab Results  Component Value Date   HGBA1C 9.1 08/21/2015     Outpatient Medications Prior to Visit  Medication Sig Dispense Refill  . aspirin 81 MG tablet Take 81 mg by mouth daily.      Marland Kitchen atorvastatin (LIPITOR) 20 MG tablet TAKE 1 TABLET EVERY DAY 90 tablet 1    . Cholecalciferol (VITAMIN D3) 2000 UNITS TABS Take 2,000 Units by mouth daily.    . empagliflozin (JARDIANCE) 10 MG TABS tablet Take 10 mg by mouth daily. In the morning 30 tablet 2  . FLUoxetine (PROZAC) 40 MG capsule Take 1 capsule (40 mg total) by mouth daily. 90 capsule 3  . glucose blood (ONE TOUCH ULTRA TEST) test strip Use to test blood glucose 3 times daily as instructed. Dx: E11.9 200 each 3  . JANUVIA 100 MG tablet TAKE ONE TABLET BY MOUTH ONCE DAILY BEFORE BREAKFAST 30 tablet 2  . levothyroxine (SYNTHROID, LEVOTHROID) 125 MCG tablet Take 1 tablet (125 mcg total) by mouth daily. 90 tablet 3  . LORazepam (ATIVAN) 1 MG tablet Take 1 tablet (1 mg total) by mouth 2 (two) times daily as needed. for anxiety 60 tablet 0  . metFORMIN (GLUCOPHAGE) 1000 MG tablet Take 1 tablet (1,000 mg total) by mouth 2 (two) times daily with a meal. 180 tablet 0  . Omega-3 Fatty Acids (FISH OIL) 1200 MG CAPS Take 1,200 mg by mouth daily.    . traZODone (DESYREL) 100 MG tablet TAKE ONE TABLET BY MOUTH AT BEDTIME *NEEDS  TO  BE  SEEN  BY  PCP  BEFORE  FURTHER  REFILLS* 30 tablet 0   No facility-administered medications prior to visit.     Review of Systems;  Patient denies headache, fevers, malaise, unintentional weight loss, skin rash, eye pain, sinus congestion and sinus pain, sore throat, dysphagia,  hemoptysis , cough, dyspnea, wheezing, chest pain, palpitations, orthopnea, edema, abdominal pain, nausea, melena, diarrhea, constipation, flank pain, dysuria, hematuria, urinary  Frequency, nocturia, numbness, tingling, seizures,  Focal weakness, Loss of consciousness,  Tremor, , depression, anxiety, and suicidal ideation.      Objective:  BP 130/84   Pulse 87   Temp 97.6 F (36.4 C) (Oral)   Resp 12   Ht 5\' 2"  (1.575 m)   Wt 141 lb 4 oz (64.1 kg)   SpO2 98%   BMI 25.83 kg/m   BP Readings from Last 3 Encounters:  04/14/16 130/84  01/25/16 110/82  08/21/15 114/68    Wt Readings from Last 3  Encounters:  04/14/16 141 lb 4 oz (64.1 kg)  01/25/16 153 lb (69.4 kg)  08/21/15 143 lb 9.6 oz (65.1 kg)    General appearance: alert, cooperative and appears stated age Ears: normal TM's and external ear canals both ears Throat: lips, mucosa, and tongue normal; teeth and gums normal Neck: no adenopathy, no carotid bruit, supple, symmetrical, trachea midline and thyroid not enlarged, symmetric, no tenderness/mass/nodules Back: symmetric, no curvature. ROM normal. No CVA tenderness. Lungs: clear to auscultation bilaterally Heart: regular rate and rhythm, S1, S2 normal, no murmur, click, rub or gallop Abdomen: soft, non-tender; bowel sounds normal; no masses,  no organomegaly Pulses: 2+ and symmetric Skin: Skin color, texture, turgor normal. No rashes or lesions Lymph nodes: Cervical, supraclavicular, and axillary nodes normal.  Lab Results  Component Value Date   HGBA1C 9.1 08/21/2015   HGBA1C 7.5 (H) 03/12/2015   HGBA1C 7.9 (H) 09/09/2014    Lab Results  Component Value Date   CREATININE 0.90 04/14/2016   CREATININE 1.01 03/12/2015   CREATININE 0.99 01/09/2015    Lab Results  Component Value Date   WBC 11.9 (H) 09/13/2013   HGB 15.3 (H) 09/13/2013   HCT 45.1 09/13/2013   PLT 304.0 09/13/2013   GLUCOSE 140 (H) 04/14/2016   CHOL 239 (H) 04/14/2016   TRIG 162.0 (H) 04/14/2016   HDL 57.90 04/14/2016   LDLDIRECT 165.0 04/14/2016   LDLCALC 149 (H) 04/14/2016   ALT 20 04/14/2016   AST 23 04/14/2016   NA 132 (L) 04/14/2016   K 5.5 (H) 04/14/2016   CL 97 04/14/2016   CREATININE 0.90 04/14/2016   BUN 23 04/14/2016   CO2 26 04/14/2016   TSH 1.98 04/14/2016   HGBA1C 9.1 08/21/2015   MICROALBUR 3.9 (H) 04/14/2016    Mm Screening Breast Tomo Bilateral  Result Date: 10/23/2014  REPORT IMPORTED FROM AN EXTERNAL SYSTEM   CLINICAL DATA:  Screening. EXAM: DIGITAL SCREENING BILATERAL MAMMOGRAM WITH 3D TOMO WITH CAD COMPARISON:  Previous exam(s). ACR Breast Density Category c:  The breast tissue is heterogeneously dense, which may obscure small masses. FINDINGS: There are no findings suspicious for malignancy. Images were processed with CAD. IMPRESSION: No mammographic evidence of malignancy. A result letter of this screening mammogram will be mailed directly to the patient. RECOMMENDATION: Screening mammogram in one year. (Code:SM-B-01Y) BI-RADS CATEGORY  1: Negative. Electronically Signed   By: Lovey Newcomer M.D.   On: 10/23/2014 09:37    Assessment & Plan:   Problem List Items Addressed This Visit  Insomnia    Trazodone increased to 150 mg today      Fatigue    Etiology appears to be sedentary lifestyle since retirement, but may also include complicated grief given the recent death of her son .  Addressed lifestyle  Ned for regular exercise and weight loss.  Lab Results  Component Value Date   TSH 1.98 04/14/2016   Lab Results  Component Value Date   VITAMINB12 235 04/14/2016   Lab Results  Component Value Date   WBC 11.9 (H) 09/13/2013   HGB 15.3 (H) 09/13/2013   HCT 45.1 09/13/2013   MCV 89.9 09/13/2013   PLT 304.0 09/13/2013         Relevant Orders   Vitamin B12 (Completed)   Folate RBC (Completed)   Hyperlipidemia associated with type 2 diabetes mellitus ([place])   Relevant Orders   LDL cholesterol, direct (Completed)   Lipid panel (Completed)   Hypothyroidism - Primary   Relevant Orders   TSH (Completed)    Other Visit Diagnoses    Vitamin D deficiency       Relevant Orders   VITAMIN D 25 Hydroxy (Vit-D Deficiency, Fractures) (Completed)   Diabetes mellitus without complication ([place])       Relevant Orders   Comprehensive metabolic panel (Completed)   Microalbumin / creatinine urine ratio (Completed)   Encounter for immunization       Relevant Orders   Flu vaccine HIGH DOSE PF (Completed)    A total of 25 minutes of face to face time was spent with patient more than half of which was spent in counselling about the above mentioned  conditions  and coordination of care   I have changed Ms. Shan's traZODone. I am also having her start on Tdap. Additionally, I am having her maintain her aspirin, Fish Oil, Vitamin D3, glucose blood, atorvastatin, FLUoxetine, metFORMIN, empagliflozin, levothyroxine, JANUVIA, and LORazepam.  Meds ordered this encounter  Medications  . traZODone (DESYREL) 150 MG tablet    Sig: Take 1 tablet (150 mg total) by mouth at bedtime.    Dispense:  90 tablet    Refill:  1  . Tdap (BOOSTRIX) 5-2.5-18.5 LF-MCG/0.5 injection    Sig: Inject 0.5 mLs into the muscle once.    Dispense:  0.5 mL    Refill:  0    Medications Discontinued During This Encounter  Medication Reason  . traZODone (DESYREL) 100 MG tablet Reorder    Follow-up: No Follow-up on file.   [name], MD

## 2016-04-14 NOTE — Patient Instructions (Addendum)
We can increase the trazodone to 150 mg .    Make yourself get up and go for a 30 minute walk DAILY  Make yourself tackle one room or one task daily in your house  If insomnia still an issue after 2 weeks,  Call for medication  Modification  Your still need your tetanus-diptheria-pertussis vaccine (TDaP) and your Shingles vaccines  but you can get it for less $$$ at a local pharmacy with the scripts I have provided you.

## 2016-04-15 LAB — FOLATE RBC: RBC FOLATE: 486 ng/mL (ref >280)

## 2016-04-16 ENCOUNTER — Other Ambulatory Visit: Payer: Self-pay | Admitting: Internal Medicine

## 2016-04-16 DIAGNOSIS — T50905A Adverse effect of unspecified drugs, medicaments and biological substances, initial encounter: Secondary | ICD-10-CM

## 2016-04-16 DIAGNOSIS — E875 Hyperkalemia: Secondary | ICD-10-CM

## 2016-04-16 DIAGNOSIS — D519 Vitamin B12 deficiency anemia, unspecified: Secondary | ICD-10-CM

## 2016-04-16 NOTE — Assessment & Plan Note (Signed)
secondary to metformin.  patient twill need to start B12 supplementation with injectable

## 2016-04-16 NOTE — Assessment & Plan Note (Signed)
Trazodone increased to 150 mg today

## 2016-04-16 NOTE — Assessment & Plan Note (Addendum)
Etiology appears to be sedentary lifestyle since retirement, but may also include complicated grief given the recent death of her son .  Addressed lifestyle  Ned for regular exercise and weight loss.  Lab Results  Component Value Date   TSH 1.98 04/14/2016   Lab Results  Component Value Date   VITAMINB12 235 04/14/2016   Lab Results  Component Value Date   WBC 11.9 (H) 09/13/2013   HGB 15.3 (H) 09/13/2013   HCT 45.1 09/13/2013   MCV 89.9 09/13/2013   PLT 304.0 09/13/2013

## 2016-04-17 ENCOUNTER — Telehealth: Payer: Self-pay | Admitting: Internal Medicine

## 2016-04-18 ENCOUNTER — Other Ambulatory Visit: Payer: Self-pay

## 2016-04-18 ENCOUNTER — Ambulatory Visit (INDEPENDENT_AMBULATORY_CARE_PROVIDER_SITE_OTHER): Payer: Medicare Other

## 2016-04-18 ENCOUNTER — Other Ambulatory Visit (INDEPENDENT_AMBULATORY_CARE_PROVIDER_SITE_OTHER): Payer: Medicare Other

## 2016-04-18 DIAGNOSIS — E538 Deficiency of other specified B group vitamins: Secondary | ICD-10-CM | POA: Diagnosis not present

## 2016-04-18 DIAGNOSIS — E875 Hyperkalemia: Secondary | ICD-10-CM

## 2016-04-18 LAB — BASIC METABOLIC PANEL
BUN: 21 mg/dL (ref 6–23)
CALCIUM: 9.9 mg/dL (ref 8.4–10.5)
CO2: 27 mEq/L (ref 19–32)
Chloride: 98 mEq/L (ref 96–112)
Creatinine, Ser: 0.93 mg/dL (ref 0.40–1.20)
GFR: 63.77 mL/min (ref >60.00)
GLUCOSE: 167 mg/dL — AB (ref 70–99)
POTASSIUM: 5.4 meq/L — AB (ref 3.5–5.1)
SODIUM: 132 meq/L — AB (ref 135–145)

## 2016-04-18 MED ORDER — METFORMIN HCL 1000 MG PO TABS: 1000.0000 mg | ORAL_TABLET | Freq: Two times a day (BID) | ORAL | 0 refills | Status: DC

## 2016-04-18 MED ORDER — CYANOCOBALAMIN 1000 MCG/ML IJ SOLN
1000.0000 ug | Freq: Once | INTRAMUSCULAR | Status: AC
  Administered 2016-04-18: 1000 ug via INTRAMUSCULAR

## 2016-04-18 NOTE — Progress Notes (Signed)
Patient came in for B12 injection, first one.  Received in L  Deltoid.  Patient tolerated well.

## 2016-04-19 ENCOUNTER — Other Ambulatory Visit: Payer: Self-pay | Admitting: Internal Medicine

## 2016-04-19 ENCOUNTER — Encounter: Payer: Self-pay | Admitting: Internal Medicine

## 2016-04-19 DIAGNOSIS — E875 Hyperkalemia: Secondary | ICD-10-CM | POA: Insufficient documentation

## 2016-04-25 ENCOUNTER — Ambulatory Visit (INDEPENDENT_AMBULATORY_CARE_PROVIDER_SITE_OTHER): Payer: Medicare Other

## 2016-04-25 DIAGNOSIS — E538 Deficiency of other specified B group vitamins: Secondary | ICD-10-CM

## 2016-04-25 MED ORDER — CYANOCOBALAMIN 1000 MCG/ML IJ SOLN
1000.0000 ug | Freq: Once | INTRAMUSCULAR | Status: AC
  Administered 2016-04-25: 1000 ug via INTRAMUSCULAR

## 2016-04-25 NOTE — Progress Notes (Signed)
Patient came for a b12 injection.  REceived in Right deltoid.  Patient tolerated well.

## 2016-04-26 ENCOUNTER — Ambulatory Visit (INDEPENDENT_AMBULATORY_CARE_PROVIDER_SITE_OTHER): Payer: Medicare Other | Admitting: Internal Medicine

## 2016-04-26 ENCOUNTER — Encounter: Payer: Self-pay | Admitting: Internal Medicine

## 2016-04-26 VITALS — BP 128/82 | Ht 61.5 in | Wt 145.0 lb

## 2016-04-26 DIAGNOSIS — E1165 Type 2 diabetes mellitus with hyperglycemia: Secondary | ICD-10-CM | POA: Diagnosis not present

## 2016-04-26 NOTE — Patient Instructions (Addendum)
Please continue: - Metformin 1000 mg 2x a day - Januvia 100 mg daily in am, before b'fast  Please add: - Glipizide ER 5 mg in am, before b'fast  If you have a larger meal or you have dessert, take 1/2 of the Glipizide before that meal. For a very large meal (Thanksgiving dinner), you can take a full Glipizide ER tablet, but crush it).  Please stop at the lab.  Please return in 3 months with your sugar log.

## 2016-04-26 NOTE — Progress Notes (Signed)
Patient ID: Haley Young, female   DOB: 01/11/1951, 65 y.o.   MRN: [id number]  HPI: Haley Young is a 65 y.o.-year-old femalemale, returning for f/u for DM2, dx 2010, non-insulin-dependent, uncontrolled, without complications. Last visit 3 mo ago.  Last hemoglobin A1c: 01/25/2016: HbA1c calculated from fructosamine: is 6.35% 08/21/2015: HbA1c calculated from fructosamine is 6.88%  Lab Results  Component Value Date   HGBA1C 9.1 08/21/2015   HGBA1C 7.5 (H) 03/12/2015   HGBA1C 7.9 (H) 09/09/2014  12/18/2014: HbA1c calculated from fructosamine is 6.88% 09/10/2014: HbA1c calculated from fructosamine is 6.9% 03/19/2014:  HbA1c calculated from fructosamine is 6.57%  Pt is on a regimen of: - Metformin 1000 mg po bid >> tolerates it well - Januvia 100 mg daily in am She was on Jardiance 25 mg daily >> started 01/2016 >> however, potassium increased >> stopped (but also costly) We started Glipizide as we had to stop Invokana >> tremors >> stopped She was on Invokana 300 mg daily >> stopped 2/2 high potassium She was on Invokana 300 mg daily - added 08/2013  - costs her 300$ >> at last visit I gave her a coupon for InvokaMet 2 tablets in am ([id number] mg) daily in am and continued Metformin 1000 mg with dinner. She tells she can get the 3 mo supply for 207%$ Tried insulin Lantus and Novolog - ~2011 Tried Cinnamon and vinegar.  Pt checks sugars 2x a day lately: - am: 88-130s >> 100-130 >> 80-115 >> 117-147, 160 >> 109-134, 165 >> 110-115 >> 114-134 >> 114-176, 189 - 2h after b'fast: n/c >> 131-141 >> n/c - before lunch: n/c >> 63-139 >> n/c >> 127-141 >> 126-172 - 2h after lunch: n/c >> 78, 137 >> n/c >> 102-132 >> n/c >> 133-163, 178, 283 >> 132-171 - before dinner: 103, 120 >> n/c>> 76-102 >> 128 >> 119-152 >> 100-140 >> 137-146, 162 >> 157, 161 - 2h after dinner: 109 >> 126-182 >> 134-184 >> 158-194, 217 >> 139-173, 213 (dessert) - bedtime: 113-219 >> 127-166, 186 >> 120-160, 180x2 (icecream)  >>134-168, 231 >>146-174, 181 - nighttime: n/c >> 138-240, most 138-170 >> n/c >> 129-160 >> n/c  Pt's meals are: - Breakfast: banana + PB or cereals; coffee + sweetener. - Lunch: yoghurt + peaches, ham sandwich; soup - Dinner: baked chicken; peas; salad  She exercises 2-3x a week by walking - 30 min.   - no CKD, last BUN/creatinine:  Lab Results  Component Value Date   BUN 21 04/18/2016   CREATININE 0.93 04/18/2016  03/12/2015: ACR 1.3 - last set of lipids: Lab Results  Component Value Date   CHOL 239 (H) 04/14/2016   HDL 57.90 04/14/2016   LDLCALC 149 (H) 04/14/2016   LDLDIRECT 165.0 04/14/2016   TRIG 162.0 (H) 04/14/2016   CHOLHDL 4 04/14/2016  On Lipitor. - last eye exam was in 10/13/2014. No DR.  - no numbness and tingling in her feet.  She saw [facility]ology at [facility] (Dr. Leonides Schanz) for fatigue and SOB >> EKG stress test >> several presumed blockages. She had a catheterization >> no blockages. She has the same % of pulm valve insufficiency: 40%, but had a new cardiac MRI >> % could not be calculated.  She also has hypothyroidism. Last TSH: Lab Results  Component Value Date   TSH 1.98 04/14/2016   TSH 0.37 01/25/2016   TSH 2.38 08/21/2015   TSH 6.32 (H) 03/12/2015   TSH 10.17 (H) 12/16/2014   TSH 7.08 (H) 09/09/2014  TSH 3.06 09/13/2013   FREET4 1.71 (H) 01/25/2016   FREET4 1.19 08/21/2015   FREET4 0.86 03/12/2015   FREET4 0.71 12/16/2014   FREET4 0.75 03/05/2010   FREET4 0.7 12/08/2009   She is on LT4 137 >> 125 mcg daily: - no skipped doses - in am - with water - separated by >30 min - no PPI, calcium, Fe, MVI  She had a low B12 vitamin >> started im B12 - had 2 inj's.  I reviewed pt's medications, allergies, PMH, social hx, family hx, and changes were documented in the history of present illness. Otherwise, unchanged from my initial visit note.   ROS: .Constitutional: + weight gain, + fatigue, no subjective hyperthermia/hypothermia Eyes: no blurry vision,  no xerophthalmia ENT: no sore throat, no nodules palpated in throat, no dysphagia/odynophagia, no hoarseness Cardiovascular: no CP/+ SOB/no palpitations/leg swelling Respiratory: no cough/+ SOB Gastrointestinal: no N/V/D/C/+ Heartburn Musculoskeletal: no muscle/joint aches Skin: no rashes  PE: BP 128/82   Ht 5' 1.5" (1.562 m)   Wt 145 lb (65.8 kg)   BMI 26.95 kg/m  Body mass index is 26.95 kg/m.  Wt Readings from Last 3 Encounters:  04/26/16 145 lb (65.8 kg)  04/14/16 141 lb 4 oz (64.1 kg)  01/25/16 153 lb (69.4 kg)   Constitutional: overweight, in NAD Eyes: PERRLA, EOMI, no exophthalmos ENT: moist mucous membranes, no thyromegaly, no cervical lymphadenopathy Cardiovascular: tachycardia, RR, + 3/6 SEM, no RG Respiratory: CTA B Gastrointestinal: abdomen soft, NT, ND, BS+ Musculoskeletal: no deformities, strength intact in all 4 Skin: moist, warm, no rashes Neurological: no tremor with outstretched hands, DTR normal in all 4  ASSESSMENT: 1. DM2, non-insulin-dependent, uncontrolled, without complications  She has been on Invokana 300 in the past, however she had a potassium of 5.8 >> Invokana was stopped. We have tried glipizide after stopping Invokana, however, she developed lows and we had to stop it.   2.  Hypothyroidism   PLAN:  1. Patient with well controlled diabetes, on oral antidiabetic regimen, with metformin + Januvia.  She could not afford the Farxiga/Jardiance but we could not have continued as she had a high potassium at last 2 checks. Will recheck BMP. - Will try to add Glipizide as her sugars are mostly above target. She also gets a 200 reading after dessert >> will advise her to add a low dose of Glipizide before a larger meal or if she has dessert. - I advised her to: Patient Instructions  Please continue: - Metformin 1000 mg 2x a day - Januvia 100 mg daily in am, before b'fast  Please add: - Glipizide ER 5 mg in am, before b'fast  If you have a larger  meal or you have dessert, take 1/2 of the Glipizide before that meal. For a very large meal (Thanksgiving dinner), you can take a full Glipizide ER tablet, but crush it).  Please stop at the lab.  Please return in 3 months with your sugar log.   - continue checking sugars at different times of the day - check 1x a day, rotating checks - She needs a new eye exam >> advised to schedule - Will check a fructosamine level  - Return to clinic in 3 mo with sugar log.    2.  Hypothyroidism  - we decreased her LT4 from 137 to 125 mcg daily at last visit - TFTs on this dose were normal earlier this mo -  we again discussed about correct administration of levothyroxine and I advised her  to take the thyroid hormone every day, with water, >30 minutes before breakfast, separated by >4 hours from acid reflux medications, calcium, iron, multivitamins. She denies missing doses anymore.  Office Visit on 04/26/2016  Component Date Value Ref Range Status  . Fructosamine 04/28/2016 281* 190 - 270 umol/L Final  . Sodium 04/27/2016 132* 135 - 146 mmol/L Final  . Potassium 04/27/2016 5.4* 3.5 - 5.3 mmol/L Final  . Chloride 04/27/2016 97* 98 - 110 mmol/L Final  . CO2 04/27/2016 25  20 - 31 mmol/L Final  . Glucose, Bld 04/27/2016 140* 65 - 99 mg/dL Final  . BUN 04/27/2016 24  7 - 25 mg/dL Final  . Creat 04/27/2016 1.03* 0.50 - 0.99 mg/dL Final   Comment:   For patients > or = 67 years of age: The upper reference limit for Creatinine is approximately 13% higher for people identified as African-American.     . Calcium 04/27/2016 9.7  8.6 - 10.4 mg/dL Final  . GFR, Est African American 04/27/2016 65  >=60 mL/min Final  . GFR, Est Non African American 04/27/2016 56* >=60 mL/min Final   HbA1c calculated from the fructosamine is still great, at 6.38%. Potassium is still above the upper limit of normal, was sodium is slightly low. Creatinine is higher than before. I have low suspicion for adrenal insufficiency,  but I do not see any immediate cause for her abnormal electrolytes...   Philemon Kingdom, MD PhD [facility]

## 2016-04-27 ENCOUNTER — Other Ambulatory Visit: Payer: Self-pay

## 2016-04-27 LAB — BASIC METABOLIC PANEL WITH GFR
BUN: 24 mg/dL (ref 7–25)
CALCIUM: 9.7 mg/dL (ref 8.6–10.4)
CHLORIDE: 97 mmol/L — AB (ref 98–110)
CO2: 25 mmol/L (ref 20–31)
CREATININE: 1.03 mg/dL — AB (ref 0.50–0.99)
GFR, Est African American: 65 mL/min (ref >=60)
GFR, Est Non African American: 56 mL/min — ABNORMAL LOW (ref >=60)
GLUCOSE: 140 mg/dL — AB (ref 65–99)
Potassium: 5.4 mmol/L — ABNORMAL HIGH (ref 3.5–5.3)
SODIUM: 132 mmol/L — AB (ref 135–146)

## 2016-04-27 MED ORDER — GLIPIZIDE ER 5 MG PO TB24: ORAL_TABLET | ORAL | 1 refills | Status: DC

## 2016-04-27 MED ORDER — ATORVASTATIN CALCIUM 20 MG PO TABS: 20.0000 mg | ORAL_TABLET | Freq: Every day | ORAL | 1 refills | Status: DC

## 2016-04-27 NOTE — Telephone Encounter (Signed)
Patient stated she was supposed to have some prescriptions called in, haven't been called in yet.  Torvastatin and Glipizide time release.

## 2016-04-28 LAB — FRUCTOSAMINE: Fructosamine: 281 umol/L — ABNORMAL HIGH (ref 190–270)

## 2016-05-02 ENCOUNTER — Ambulatory Visit (INDEPENDENT_AMBULATORY_CARE_PROVIDER_SITE_OTHER): Payer: Medicare Other

## 2016-05-02 DIAGNOSIS — E538 Deficiency of other specified B group vitamins: Secondary | ICD-10-CM | POA: Diagnosis not present

## 2016-05-02 MED ORDER — CYANOCOBALAMIN 1000 MCG/ML IJ SOLN
1000.0000 ug | Freq: Once | INTRAMUSCULAR | Status: AC
  Administered 2016-05-02: 1000 ug via INTRAMUSCULAR

## 2016-05-02 NOTE — Progress Notes (Signed)
Patient came in for b12 injection, received in Left deltoid.  Patient tolerated well.

## 2016-05-10 NOTE — Progress Notes (Signed)
  I have reviewed the above information and agree with above.   Danner Paulding, MD 

## 2016-06-06 ENCOUNTER — Ambulatory Visit (INDEPENDENT_AMBULATORY_CARE_PROVIDER_SITE_OTHER): Payer: Medicare Other | Admitting: Family Medicine

## 2016-06-06 ENCOUNTER — Ambulatory Visit (INDEPENDENT_AMBULATORY_CARE_PROVIDER_SITE_OTHER): Payer: Medicare Other

## 2016-06-06 ENCOUNTER — Encounter: Payer: Self-pay | Admitting: Family Medicine

## 2016-06-06 DIAGNOSIS — R05 Cough: Secondary | ICD-10-CM | POA: Diagnosis not present

## 2016-06-06 DIAGNOSIS — R059 Cough, unspecified: Secondary | ICD-10-CM

## 2016-06-06 DIAGNOSIS — E538 Deficiency of other specified B group vitamins: Secondary | ICD-10-CM | POA: Diagnosis not present

## 2016-06-06 MED ORDER — HYDROCOD POLST-CPM POLST ER 10-8 MG/5ML PO SUER: 5.0000 mL | Freq: Two times a day (BID) | ORAL | 0 refills | Status: DC | PRN

## 2016-06-06 MED ORDER — CYANOCOBALAMIN 1000 MCG/ML IJ SOLN
1000.0000 ug | Freq: Once | INTRAMUSCULAR | Status: AC
  Administered 2016-06-06: 1000 ug via INTRAMUSCULAR

## 2016-06-06 NOTE — Progress Notes (Signed)
Patient comes in for B 12 injection .  Injected right deltoid patient tolerated injection well.

## 2016-06-06 NOTE — Assessment & Plan Note (Signed)
New problem. Post-infectious. Lungs clear. No indication for imaging. Treating with tussionex. Reassurance provided.

## 2016-06-06 NOTE — Patient Instructions (Signed)
Use the cough medication as directed.  Call if you worsen or fail to improve.  Take care  Dr. Lacinda Axon

## 2016-06-06 NOTE — Progress Notes (Signed)
Subjective:  Patient ID: Haley Young, female    DOB: 10-12-1950  Age: 65 y.o. MRN: [id number]  CC: Cough  HPI:  65 year old female with diabetes, pulmonary valve insufficiency, hypothyroidism, hyperlipidemia presents with complaints of cough.  Patient states that she's been sick for the past 2 weeks. She was seen in urgent care and was prescribed amoxicillin. She states that since that time she's had improvement in her symptoms (congestion, sinus pressure). However she still has a productive cough. Mildly productive of discolored sputum. She been using over-the-counter medications in addition to the amoxicillin without significant improvement. No associated fevers or chills. No shortness of breath. Seems to be worse at night. No other associated symptoms. No other complaints at this time.  Social Hx   Social History   Social History  . Marital status: Married    Spouse name: N/A  . Number of children: 2  . Years of education: N/A   Occupational History  . Retired Retired  . Sales     Social History Main Topics  . Smoking status: Never Smoker  . Smokeless tobacco: Never Used  . Alcohol use Yes     Comment: occasional wine   . Drug use: No  . Sexual activity: Not Asked   Other Topics Concern  . None   Social History Narrative   Divorced x 2      Sister is pt here- Surveyor, minerals      Caress for 29 year old mother      46 for daughter with down's syndrome      Adopted sone with Asberger's syndrome      G1P1      Cares for 2 disabled children     Review of Systems  Constitutional: Negative for fever.  Respiratory: Positive for cough. Negative for shortness of breath.    Objective:  Pulse 94   Temp 97.4 F (36.3 C) (Oral)   Resp 16   Wt 141 lb 8 oz (64.2 kg)   SpO2 96%   BMI 26.30 kg/m   BP/Weight 06/06/2016 04/26/2016 [id number]  Systolic BP - 0000000 [id number]  Diastolic BP - 82 84  Wt. (Lbs) 141.5 145 141.25  BMI 26.3 26.95 25.83   Physical Exam    Constitutional: She is oriented to person, place, and time. She appears well-developed. No distress.  HENT:  Mouth/Throat: Oropharynx is clear and moist.  Cardiovascular: Normal rate and regular rhythm.   3/6 murmur noted.  Pulmonary/Chest: Effort normal. She has no wheezes. She has no rales.  Neurological: She is alert and oriented to person, place, and time.  Psychiatric: She has a normal mood and affect.  Vitals reviewed.   Lab Results  Component Value Date   WBC 11.9 (H) 09/13/2013   HGB 15.3 (H) 09/13/2013   HCT 45.1 09/13/2013   PLT 304.0 09/13/2013   GLUCOSE 140 (H) 04/26/2016   CHOL 239 (H) 04/14/2016   TRIG 162.0 (H) 04/14/2016   HDL 57.90 04/14/2016   LDLDIRECT 165.0 04/14/2016   LDLCALC 149 (H) 04/14/2016   ALT 20 04/14/2016   AST 23 04/14/2016   NA 132 (L) 04/26/2016   K 5.4 (H) 04/26/2016   CL 97 (L) 04/26/2016   CREATININE 1.03 (H) 04/26/2016   BUN 24 04/26/2016   CO2 25 04/26/2016   TSH 1.98 04/14/2016   HGBA1C 9.1 08/21/2015   MICROALBUR 3.9 (H) 04/14/2016    Assessment & Plan:   Problem List Items Addressed This Visit  Cough    New problem. Post-infectious. Lungs clear. No indication for imaging. Treating with tussionex. Reassurance provided.         Meds ordered this encounter  Medications  . chlorpheniramine-HYDROcodone (TUSSIONEX PENNKINETIC ER) 10-8 MG/5ML SUER    Sig: Take 5 mLs by mouth every 12 (twelve) hours as needed.    Dispense:  115 mL    Refill:  0    Follow-up: PRN  [place]

## 2016-07-08 ENCOUNTER — Ambulatory Visit (INDEPENDENT_AMBULATORY_CARE_PROVIDER_SITE_OTHER): Payer: Medicare Other

## 2016-07-08 VITALS — BP 122/72 | HR 75 | Temp 97.5°F | Resp 12 | Ht 61.5 in | Wt 142.0 lb

## 2016-07-08 DIAGNOSIS — E2839 Other primary ovarian failure: Secondary | ICD-10-CM

## 2016-07-08 DIAGNOSIS — Z0000 Encounter for general adult medical examination without abnormal findings: Secondary | ICD-10-CM

## 2016-07-08 NOTE — Progress Notes (Signed)
Subjective:   Haley Young is a 65 y.o. female who presents for Medicare Annual (Subsequent) preventive examination.  Review of Systems:  No ROS.  Medicare Wellness Visit.  Cardiac Risk Factors include: diabetes mellitus     Objective:     Vitals: BP 122/72 (BP Location: Left Arm, Patient Position: Sitting, Cuff Size: Normal)   Pulse 75   Temp 97.5 F (36.4 C) (Oral)   Resp 12   Ht 5' 1.5" (1.562 m)   Wt 142 lb (64.4 kg)   SpO2 96%   BMI 26.40 kg/m   Body mass index is 26.4 kg/m.   Tobacco History  Smoking Status  . Never Smoker  Smokeless Tobacco  . Never Used     Counseling given: Not Answered   Past Medical History:  Diagnosis Date  . Asthma   . Chicken pox   . Depression   . Diabetes mellitus   . Frequent UTI   . GERD (gastroesophageal reflux disease)   . Heart disease   . Heart murmur   . 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 Hx of colonic polyps   . Hyperhidrosis    especially with hot flashes    . Hyperlipidemia   . Lipoma    R axilla   . MRSA cellulitis    surgical wound infection  . Osteoporosis   . Pyelocystitis    as a child   . Thyroid disease   . Urinary incontinence    Past Surgical History:  Procedure Laterality Date  . ABDOMINAL HYSTERECTOMY  1970  . [place], 2005   pulmonary stenosis with valve replacement, Dr. Evelina Dun  . PARTIAL HYSTERECTOMY  1970  . PULMONARY VEIN STENOSIS REPAIR    . VALVE REPLACEMENT     Family History  Problem Relation Age of Onset  . Coarctation of the aorta Sister   . Diabetes Mother   . COPD Mother   . Obesity Mother   . Depression Mother   . Alcohol abuse Father   . Cirrhosis Father   . Diabetes Sister   . Down syndrome Brother   . Down syndrome Daughter   . Lupus Daughter   . Ulcerative colitis Daughter    History  Sexual Activity  . Sexual activity: Yes    Outpatient Encounter Prescriptions as of 07/08/2016  Medication Sig  . atorvastatin  (LIPITOR) 20 MG tablet Take 1 tablet (20 mg total) by mouth daily.  . Cholecalciferol (VITAMIN D3) 2000 UNITS TABS Take 2,000 Units by mouth daily.  . empagliflozin (JARDIANCE) 10 MG TABS tablet Take 10 mg by mouth daily. In the morning  . FLUoxetine (PROZAC) 40 MG capsule Take 1 capsule (40 mg total) by mouth daily.  Marland Kitchen glipiZIDE (GLUCOTROL XL) 5 MG 24 hr tablet Please take 5 mg in the am before breakfast.  . glucose blood (ONE TOUCH ULTRA TEST) test strip Use to test blood glucose 3 times daily as instructed. Dx: E11.9  . JANUVIA 100 MG tablet TAKE ONE TABLET BY MOUTH ONCE DAILY BEFORE BREAKFAST  . levothyroxine (SYNTHROID, LEVOTHROID) 125 MCG tablet Take 1 tablet (125 mcg total) by mouth daily.  Marland Kitchen LORazepam (ATIVAN) 1 MG tablet Take 1 tablet (1 mg total) by mouth 2 (two) times daily as needed. for anxiety  . metFORMIN (GLUCOPHAGE) 1000 MG tablet Take 1 tablet (1,000 mg total) by mouth 2 (two) times daily with a meal.  . traZODone (DESYREL) 150 MG tablet  Take 1 tablet (150 mg total) by mouth at bedtime.  Marland Kitchen aspirin 81 MG tablet Take 81 mg by mouth daily.    . chlorpheniramine-HYDROcodone (TUSSIONEX PENNKINETIC ER) 10-8 MG/5ML SUER Take 5 mLs by mouth every 12 (twelve) hours as needed. (Patient not taking: Reported on 07/08/2016)  . Omega-3 Fatty Acids (FISH OIL) 1200 MG CAPS Take 1,200 mg by mouth daily.   No facility-administered encounter medications on file as of 07/08/2016.     Activities of Daily Living In your present state of health, do you have any difficulty performing the following activities: 07/08/2016  Hearing? N  Vision? N  Difficulty concentrating or making decisions? N  Walking or climbing stairs? Y  Dressing or bathing? N  Doing errands, shopping? N  Preparing Food and eating ? N  Using the Toilet? N  In the past six months, have you accidently leaked urine? Y  Do you have problems with loss of bowel control? N  Managing your Medications? N  Managing your Finances? N    Housekeeping or managing your Housekeeping? N  Some recent data might be hidden    Patient Care Team: [name], MD as PCP - General (Internal Medicine)    Assessment:    This is a routine wellness examination for [facility]. The goal of the wellness visit is to assist the patient how to close the gaps in care and create a preventative care plan for the patient.   Taking calcium VIT D as appropriate/Osteoporosis reviewed.  Dexa Scan ordered; follow as directed.  Educational material provided.  Medications reviewed; taking without issues or barriers.  Safety issues reviewed; lives with husband. Smoke detectors in the home. No firearms in the home. Wears seatbelts when driving or riding with others. No violence in the home.  No identified risk were noted; The patient was oriented x 3; appropriate in dress and manner and no objective failures at ADL's or IADL's.   BMI; discussed the importance of a healthy diet, water intake and exercise. Educational material provided.  Patient Concerns:Urine leaks have increased.  Continuing to manage with a daily pad.  Kegel exercises encouraged along with an appointment with PCP for follow up.   Exercise Activities and Dietary recommendations Current Exercise Habits: The patient does not participate in regular exercise at present  Goals    . Increase physical activity      Fall Risk Fall Risk  07/08/2016 03/07/2016 08/08/2014  Falls in the past year? No No Yes  Number falls in past yr: - - 2 or more  Risk for fall due to : - - Impaired balance/gait   Depression Screen PHQ 2/9 Scores 07/08/2016 05/29/2015 08/08/2014  PHQ - 2 Score 0 4 2  PHQ- 9 Score - 14 8     Cognitive Function MMSE - Mini Mental State Exam 07/08/2016  Orientation to time 5  Orientation to Place 5  Registration 3  Attention/ Calculation 5  Recall 3  Language- name 2 objects 2  Language- repeat 1  Language- follow 3 step command 3  Language- read & follow  direction 1  Write a sentence 1  Copy design 1  Total score 30        Immunization History  Administered Date(s) Administered  . H1N1 08/13/2008  . Influenza Split 07/28/2011, 06/28/2012  . Influenza, High Dose Seasonal PF 04/14/2016  . Influenza,inj,Quad PF,36+ Mos 07/25/2013, 08/21/2015  . Pneumococcal Conjugate-13 09/13/2013  . Pneumococcal Polysaccharide-23 07/30/2011   Screening Tests Health Maintenance  Topic Date Due  . Hepatitis C Screening  August 13, 1950  . TETANUS/TDAP  07/25/1967  . ZOSTAVAX  07/24/2008  . DEXA SCAN  07/24/2013  . FOOT EXAM  09/14/2014  . OPHTHALMOLOGY EXAM  10/13/2015  . HEMOGLOBIN A1C  02/18/2016  . PNA vac Low Risk Adult (2 of 2 - PPSV23) 07/29/2016  . MAMMOGRAM  10/22/2016  . URINE MICROALBUMIN  04/14/2017  . COLONOSCOPY  09/14/2022  . INFLUENZA VACCINE  Completed      Plan:    End of life planning; Advance aging; Advanced directives discussed. No HCPOA/Living Will.  Additional information provided to help her start the conversation with her family.  Copy of HCPOA/Living Will short forms requested upon completion.  Time spent with this topic is 23 minutes.  Medicare Attestation I have personally reviewed: The patient's medical and social history Their use of alcohol, tobacco or illicit drugs Their current medications and supplements The patient's functional ability including ADLs,fall risks, home safety risks, cognitive, and hearing and visual impairment Diet and physical activities Evidence for depression   The patient's weight, height, BMI, and visual acuity have been recorded in the chart.  I have made referrals and provided education to the patient based on review of the above and I have provided the patient with a written personalized care plan for preventive services.     During the course of the visit the patient was educated and counseled about the following appropriate screening and preventive services:   Vaccines to include  Pneumoccal, Influenza, Hepatitis B, Td, Zostavax, HCV  Electrocardiogram  Cardiovascular Disease  Colorectal cancer screening  Bone density screening  Diabetes screening  Glaucoma screening  Mammography/PAP  Nutrition counseling   Patient Instructions (the written plan) was given to the patient.   Varney Biles, LPN  [id number]

## 2016-07-08 NOTE — Patient Instructions (Addendum)
Haley Young , Thank you for taking time to come for your Medicare Wellness Visit. I appreciate your ongoing commitment to your health goals. Please review the following plan we discussed and let me know if I can assist you in the future.   Follow up with Dr. Derrel Nip as needed.  You should receive a phone call in 1-2 weeks to schedule the appointment for BONE DENSITY (DEXA SCAN).  Happy Holidays!!  These are the goals we discussed: Goals    . Increase physical activity       This is a list of the screening recommended for you and due dates:  Health Maintenance  Topic Date Due  .  Hepatitis C: One time screening is recommended by Center for Disease Control  (CDC) for  adults born from [birth year] through 1967.   1950/10/07  . Tetanus Vaccine  07/25/1967  . Shingles Vaccine  07/24/2008  . DEXA scan (bone density measurement)  07/24/2013  . Complete foot exam   09/14/2014  . Eye exam for diabetics  10/13/2015  . Hemoglobin A1C  02/18/2016  . Pneumonia vaccines (2 of 2 - PPSV23) 07/29/2016  . Mammogram  10/22/2016  . Urine Protein Check  04/14/2017  . Colon Cancer Screening  09/14/2022  . Flu Shot  Completed   Bone Densitometry Introduction Bone densitometry is an imaging test that uses a special X-ray to measure the amount of calcium and other minerals in your bones (bone density). This test is also known as a bone mineral density test or dual-energy X-ray absorptiometry (DXA). The test can measure bone density at your hip and your spine. It is similar to having a regular X-ray. You may have this test to:  Diagnose a condition that causes weak or thin bones (osteoporosis).  Predict your risk of a broken bone (fracture).  Determine how well osteoporosis treatment is working. Tell a health care provider about:  Any allergies you have.  All medicines you are taking, including vitamins, herbs, eye drops, creams, and over-the-counter medicines.  Any problems you or family members have  had with anesthetic medicines.  Any blood disorders you have.  Any surgeries you have had.  Any medical conditions you have.  Possibility of pregnancy.  Any other medical test you had within the previous 14 days that used contrast material. What are the risks? Generally, this is a safe procedure. However, problems can occur and may include the following:  This test exposes you to a very small amount of radiation.  The risks of radiation exposure may be greater to unborn children. What happens before the procedure?  Do not take any calcium supplements for 24 hours before having the test. You can otherwise eat and drink what you usually do.  Take off all metal jewelry, eyeglasses, dental appliances, and any other metal objects. What happens during the procedure?  You may lie on an exam table. There will be an X-ray generator below you and an imaging device above you.  Other devices, such as boxes or braces, may be used to position your body properly for the scan.  You will need to lie still while the machine slowly scans your body.  The images will show up on a computer monitor. What happens after the procedure? You may need more testing at a later time. This information is not intended to replace advice given to you by your health care provider. Make sure you discuss any questions you have with your health care provider. Document Released: 07/19/2004  Document Revised: 12/03/2015 Document Reviewed: 12/05/2013  2017 Elsevier

## 2016-07-11 NOTE — Progress Notes (Signed)
  I have reviewed the above information and agree with above.   Teresa Tullo, MD 

## 2016-07-12 ENCOUNTER — Ambulatory Visit (INDEPENDENT_AMBULATORY_CARE_PROVIDER_SITE_OTHER): Payer: Medicare Other

## 2016-07-12 DIAGNOSIS — E538 Deficiency of other specified B group vitamins: Secondary | ICD-10-CM

## 2016-07-12 MED ORDER — CYANOCOBALAMIN 1000 MCG/ML IJ SOLN
1000.0000 ug | Freq: Once | INTRAMUSCULAR | Status: AC
  Administered 2016-07-12: 1000 ug via INTRAMUSCULAR

## 2016-07-12 NOTE — Progress Notes (Signed)
Patient comes in for B 12 injection.  Injected left deltoid.  Patient tolerated injection well.  

## 2016-07-13 NOTE — Progress Notes (Signed)
  I have reviewed the above information and agree with above.   Adalberto Metzgar, MD 

## 2016-07-19 ENCOUNTER — Other Ambulatory Visit: Payer: Self-pay | Admitting: Internal Medicine

## 2016-07-21 ENCOUNTER — Telehealth: Payer: Self-pay | Admitting: Internal Medicine

## 2016-07-21 ENCOUNTER — Other Ambulatory Visit: Payer: Self-pay

## 2016-07-21 MED ORDER — GLUCOSE BLOOD VI STRP: ORAL_STRIP | 2 refills | Status: DC

## 2016-07-21 NOTE — Telephone Encounter (Signed)
She can continue on metformin and glipizide ER for now. Continue to check sugars and will review them together at her next appointment in a week.

## 2016-07-21 NOTE — Telephone Encounter (Signed)
Pt called and said that the prescription for the Test Strips did not have the Dx Code on the prescription so it needs to be resubmitted to the pharmacy. Also, the Januvia is costing her $400 due to not meeting her deductible yet, she would like to know if there is anything we have here that will help with the cost.

## 2016-07-22 ENCOUNTER — Telehealth: Payer: Self-pay

## 2016-07-22 NOTE — Telephone Encounter (Signed)
Called and notified patient to stay on medications until appointment next week. She had no other questions, and will come next week to talk to [place].

## 2016-07-25 ENCOUNTER — Ambulatory Visit: Payer: Medicare Other

## 2016-07-27 ENCOUNTER — Ambulatory Visit: Payer: Medicare Other | Admitting: Internal Medicine

## 2016-08-01 ENCOUNTER — Encounter: Payer: Self-pay | Admitting: Internal Medicine

## 2016-08-16 ENCOUNTER — Ambulatory Visit (INDEPENDENT_AMBULATORY_CARE_PROVIDER_SITE_OTHER): Payer: Medicare Other

## 2016-08-16 DIAGNOSIS — E538 Deficiency of other specified B group vitamins: Secondary | ICD-10-CM | POA: Diagnosis not present

## 2016-08-16 MED ORDER — CYANOCOBALAMIN 1000 MCG/ML IJ SOLN
1000.0000 ug | Freq: Once | INTRAMUSCULAR | Status: AC
  Administered 2016-08-16: 1000 ug via INTRAMUSCULAR

## 2016-08-16 NOTE — Progress Notes (Addendum)
Patient comes in for B 12 injection. Injected right deltoid per patients request .  Patient tolerated injection well.   Reviewed.  Dr  Nicki Reaper

## 2016-09-06 ENCOUNTER — Other Ambulatory Visit: Payer: Self-pay

## 2016-09-06 MED ORDER — GLUCOSE BLOOD VI STRP: ORAL_STRIP | 2 refills | Status: DC

## 2016-09-12 ENCOUNTER — Ambulatory Visit
Admission: RE | Admit: 2016-09-12 | Discharge: 2016-09-12 | Disposition: A | Discharge status: Home / Self Care | Payer: Medicare Other | Source: Ambulatory Visit | Attending: Internal Medicine | Admitting: Internal Medicine

## 2016-09-12 DIAGNOSIS — Z780 Asymptomatic menopausal state: Secondary | ICD-10-CM | POA: Diagnosis not present

## 2016-09-12 DIAGNOSIS — E2839 Other primary ovarian failure: Secondary | ICD-10-CM | POA: Diagnosis not present

## 2016-09-12 DIAGNOSIS — Z79899 Other long term (current) drug therapy: Secondary | ICD-10-CM | POA: Diagnosis not present

## 2016-09-12 DIAGNOSIS — E119 Type 2 diabetes mellitus without complications: Secondary | ICD-10-CM | POA: Insufficient documentation

## 2016-09-12 DIAGNOSIS — Z7982 Long term (current) use of aspirin: Secondary | ICD-10-CM | POA: Diagnosis not present

## 2016-09-12 DIAGNOSIS — M85852 Other specified disorders of bone density and structure, left thigh: Secondary | ICD-10-CM | POA: Diagnosis not present

## 2016-09-12 DIAGNOSIS — Z794 Long term (current) use of insulin: Secondary | ICD-10-CM | POA: Insufficient documentation

## 2016-09-13 ENCOUNTER — Other Ambulatory Visit: Payer: Self-pay | Admitting: Internal Medicine

## 2016-09-14 ENCOUNTER — Encounter: Payer: Self-pay | Admitting: Internal Medicine

## 2016-09-15 ENCOUNTER — Ambulatory Visit (INDEPENDENT_AMBULATORY_CARE_PROVIDER_SITE_OTHER): Payer: Medicare Other | Admitting: *Deleted

## 2016-09-15 DIAGNOSIS — T50905A Adverse effect of unspecified drugs, medicaments and biological substances, initial encounter: Secondary | ICD-10-CM | POA: Diagnosis not present

## 2016-09-15 DIAGNOSIS — D519 Vitamin B12 deficiency anemia, unspecified: Secondary | ICD-10-CM | POA: Diagnosis not present

## 2016-09-15 MED ORDER — CYANOCOBALAMIN 1000 MCG/ML IJ SOLN
1000.0000 ug | Freq: Once | INTRAMUSCULAR | Status: AC
  Administered 2016-09-15: 1000 ug via INTRAMUSCULAR

## 2016-09-15 NOTE — Progress Notes (Signed)
Patient presented for monthly B 12 injection to  Patient requested to right deltoid, patient showed no signs of discomfort during injection.

## 2016-09-16 NOTE — Progress Notes (Signed)
  I have reviewed the above information and agree with above.   Clifton Kovacic, MD 

## 2016-09-20 DIAGNOSIS — E109 Type 1 diabetes mellitus without complications: Secondary | ICD-10-CM | POA: Diagnosis not present

## 2016-09-20 DIAGNOSIS — M25512 Pain in left shoulder: Secondary | ICD-10-CM | POA: Diagnosis not present

## 2016-09-26 ENCOUNTER — Encounter: Payer: Self-pay | Admitting: Internal Medicine

## 2016-09-26 ENCOUNTER — Ambulatory Visit (INDEPENDENT_AMBULATORY_CARE_PROVIDER_SITE_OTHER): Payer: Medicare Other | Admitting: Internal Medicine

## 2016-09-26 VITALS — BP 110/68 | HR 72 | Ht 62.0 in | Wt 141.0 lb

## 2016-09-26 DIAGNOSIS — E1165 Type 2 diabetes mellitus with hyperglycemia: Secondary | ICD-10-CM | POA: Diagnosis not present

## 2016-09-26 DIAGNOSIS — E038 Other specified hypothyroidism: Secondary | ICD-10-CM | POA: Diagnosis not present

## 2016-09-26 NOTE — Patient Instructions (Addendum)
Please continue: - Metformin 1000 mg 2x a day - Glipizide ER 5 mg in am, before b'fast  If you have a larger meal or you have dessert, take 1/2 of the Glipizide before that meal.  Please stop at the lab.  Please return in 3 months with your sugar log.

## 2016-09-26 NOTE — Progress Notes (Signed)
Patient ID: Haley Young, female   DOB: 12-30-1950, 66 y.o.   MRN: [id number]  HPI: Haley Young is a 66 y.o.-year-old female, returning for f/u for DM2, dx 2010, non-insulin-dependent, uncontrolled, without complications. Last visit 5 mo ago.  Last hemoglobin A1c: 04/26/2016: HbA1c calculated from fructosamine is 6.38%. 01/25/2016: HbA1c calculated from fructosamine is 6.35% 08/21/2015: HbA1c calculated from fructosamine is 6.88%  Lab Results  Component Value Date   HGBA1C 9.1 08/21/2015   HGBA1C 7.5 (H) 03/12/2015   HGBA1C 7.9 (H) 09/09/2014  12/18/2014: HbA1c calculated from fructosamine is 6.88% 09/10/2014: HbA1c calculated from fructosamine is 6.9% 03/19/2014:  HbA1c calculated from fructosamine is 6.57%  Pt is on a regimen of: - Metformin 1000 mg 2x a day - Januvia 100 mg daily - Glipizide ER 5 mg daily in am  >> Did not use >> Did not use She was on Jardiance 25 mg daily >> started 01/2016 >> however, potassium increased >> stopped (but also costly) We started Glipizide as we had to stop Invokana >> tremors >> stopped She was on Invokana 300 mg daily >> stopped 2/2 high potassium She was on Invokana 300 mg daily - added 08/2013  - costs her 300$ >> at last visit I gave her a coupon for InvokaMet 2 tablets in am [phone]) daily in am and continued Metformin 1000 mg with dinner. She tells she can get the 3 mo supply for 207%$ Tried insulin Lantus and Novolog - ~2011 Tried Cinnamon and vinegar.  Pt checks sugars 2x a day lately: - am: 117-147, 160 >> 109-134, 165 >> 110-115 >> 114-134 >> 114-176, 189 >> 99-136 - 2h after b'fast: n/c >> 131-141 >> n/c - before lunch: n/c >> 63-139 >> n/c >> 127-141 >> 126-172 >> 86-148, 152 - 2h after lunch:  n/c >> 102-132 >> n/c >> 133-163, 178, 283 >> 132-171 >> 131-164, 178 - before dinner: 128 >> 119-152 >> 100-140 >> 137-146, 162 >> 157, 161 >> 133-139 - 2h after dinner: 134-184 >> 158-194, 217 >> 139-173, 213 (dessert) >> 126-178,  182 - bedtime: 120-160, 180x2 (icecream) >>134-168, 231 >>146-174, 181 >> 124-177, 181, 197 - nighttime: n/c >> 138-240, most 138-170 >> n/c >> 129-160 >> n/c  Pt's meals are: - Breakfast: banana + PB or cereals; coffee + sweetener. - Lunch: yoghurt + peaches, ham sandwich; soup - Dinner: baked chicken; peas; salad She exercises 2-3x a week by walking - 30 min.   - no CKD, last BUN/creatinine:  Lab Results  Component Value Date   BUN 24 04/26/2016   CREATININE 1.03 (H) 04/26/2016  03/12/2015: ACR 1.3 - last set of lipids: Lab Results  Component Value Date   CHOL 239 (H) 04/14/2016   HDL 57.90 04/14/2016   LDLCALC 149 (H) 04/14/2016   LDLDIRECT 165.0 04/14/2016   TRIG 162.0 (H) 04/14/2016   CHOLHDL 4 04/14/2016  On Lipitor. - last eye exam was in 10/13/2014. No DR.  - no numbness and tingling in her feet.  She sees cardiology at [facility] (Dr. Leonides Schanz). She had a catheterization >> no blockages. 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  She also has hypothyroidism. Last TSH: Lab Results  Component Value Date   TSH 1.98 04/14/2016   TSH 0.37 01/25/2016   TSH 2.38 08/21/2015   TSH 6.32 (H) 03/12/2015   TSH 10.17 (H) 12/16/2014   TSH 7.08 (H) 09/09/2014   TSH  3.06 09/13/2013   FREET4 1.71 (H) 01/25/2016   FREET4 1.19 08/21/2015   FREET4 0.86 03/12/2015   FREET4 0.71 12/16/2014   FREET4 0.75 03/05/2010   FREET4 0.7 12/08/2009   She is on LT4 137 >> 125 mcg daily: - no skipped doses - in am - with water - separated by >30 min - no PPI, calcium, Fe, MVI  She also has a h/o a low B12 vitamin.  I reviewed pt's medications, allergies, PMH, social hx, family hx, and changes were documented in the history of present illness. Otherwise, unchanged from my initial visit note.   ROS: .Constitutional: no weight loss and gain, no fatigue, no subjective hyperthermia/hypothermia Eyes: no blurry vision, no  xerophthalmia ENT: no sore throat, no nodules palpated in throat, no dysphagia/odynophagia, no hoarseness Cardiovascular: no CP/SOB/no palpitations/leg swelling Respiratory: no cough/SOB Gastrointestinal: no N/V/D/C/Heartburn Musculoskeletal: no muscle/joint aches Skin: no rashes; + vaginal dryness  PE: BP 110/68 (BP Location: Left Arm, Patient Position: Sitting)   Pulse 72   Ht 5\' 2"  (1.575 m)   Wt 141 lb (64 kg)   SpO2 97%   BMI 25.79 kg/m  Body mass index is 25.79 kg/m.  Wt Readings from Last 3 Encounters:  09/26/16 141 lb (64 kg)  07/08/16 142 lb (64.4 kg)  06/06/16 141 lb 8 oz (64.2 kg)   Constitutional: overweight, in NAD Eyes: PERRLA, EOMI, no exophthalmos ENT: moist mucous membranes, no thyromegaly, no cervical lymphadenopathy Cardiovascular: tachycardia, RR, + 3/6 SEM, no RG Respiratory: CTA B Gastrointestinal: abdomen soft, NT, ND, BS+ Musculoskeletal: no deformities, strength intact in all 4 Skin: moist, warm, no rashes Neurological: no tremor with outstretched hands, DTR normal in all 4  ASSESSMENT: 1. DM2, non-insulin-dependent, uncontrolled, without complications  She has been on Invokana 300 in the past, however she had a potassium of 5.8 >> Invokana was stopped. We have tried glipizide after stopping Invokana, however, she developed lows and we had to stop it.   2.  Hypothyroidism   PLAN:  1. Patient with well controlled diabetes, on oral antidiabetic regimen, with metformin + Januvia.  She could not afford the Farxiga/Jardiance but we could not have continued as she had a high potassium at last 2 checks and also has vaginal dryness.  - At last visit, we added Glipizide as her sugars were mostly above target. Sugars have improved at this visit. Will not change the regimen for now. - I advised her to: Patient Instructions  Please continue: - Metformin 1000 mg 2x a day - Glipizide ER 5 mg in am, before b'fast  If you have a larger meal or you have  dessert, take 1/2 of the Glipizide before that meal.  Please stop at the lab.  Please return in 3 months with your sugar log.   - continue checking sugars at different times of the day - check 1x a day, rotating checks - She needs a new eye exam >> again advised to schedule - Will check a fructosamine level today - Return to clinic in 3 mo with sugar log.    2.  Hypothyroidism  - On LT4 125 mcg daily at last visit - TFTs on this dose were normal at last check >> we'll not recheck today -  we again discussed about correct administration of levothyroxine and I advised her to take the thyroid hormone every day, with water, >30 minutes before breakfast, separated by >4 hours from acid reflux medications, calcium, iron, multivitamins. She is taking it correctly.  HbA1c calculated from fructosamine is slightly higher, at 7.0%.  Philemon Kingdom, MD PhD [facility] [facility]

## 2016-09-28 LAB — FRUCTOSAMINE: FRUCTOSAMINE: 318 umol/L — AB (ref 190–270)

## 2016-10-17 ENCOUNTER — Other Ambulatory Visit: Payer: Self-pay | Admitting: Internal Medicine

## 2016-10-17 NOTE — Telephone Encounter (Signed)
Last refill in Ativan was 04/28/16, last OV was with Dr. Lacinda Axon on 06/06/16. Has a scheduled appt tomorrow? Ok to refill?

## 2016-10-18 ENCOUNTER — Ambulatory Visit: Payer: Medicare Other

## 2016-10-18 ENCOUNTER — Ambulatory Visit (INDEPENDENT_AMBULATORY_CARE_PROVIDER_SITE_OTHER): Payer: Medicare Other | Admitting: Family

## 2016-10-18 ENCOUNTER — Encounter: Payer: Self-pay | Admitting: Family

## 2016-10-18 ENCOUNTER — Emergency Department: Payer: Medicare Other

## 2016-10-18 ENCOUNTER — Emergency Department
Admission: EM | Admit: 2016-10-18 | Discharge: 2016-10-18 | Disposition: A | Discharge status: Home / Self Care | Payer: Medicare Other | Attending: Emergency Medicine | Admitting: Emergency Medicine

## 2016-10-18 VITALS — BP 96/54 | HR 126 | Temp 98.3°F | Ht 62.0 in | Wt 135.6 lb

## 2016-10-18 DIAGNOSIS — F329 Major depressive disorder, single episode, unspecified: Secondary | ICD-10-CM | POA: Diagnosis not present

## 2016-10-18 DIAGNOSIS — R000 Tachycardia, unspecified: Secondary | ICD-10-CM

## 2016-10-18 DIAGNOSIS — E039 Hypothyroidism, unspecified: Secondary | ICD-10-CM | POA: Insufficient documentation

## 2016-10-18 DIAGNOSIS — R0602 Shortness of breath: Secondary | ICD-10-CM | POA: Diagnosis not present

## 2016-10-18 DIAGNOSIS — E119 Type 2 diabetes mellitus without complications: Secondary | ICD-10-CM | POA: Insufficient documentation

## 2016-10-18 DIAGNOSIS — Z7982 Long term (current) use of aspirin: Secondary | ICD-10-CM | POA: Diagnosis not present

## 2016-10-18 DIAGNOSIS — Z7984 Long term (current) use of oral hypoglycemic drugs: Secondary | ICD-10-CM | POA: Insufficient documentation

## 2016-10-18 DIAGNOSIS — J45909 Unspecified asthma, uncomplicated: Secondary | ICD-10-CM | POA: Diagnosis not present

## 2016-10-18 DIAGNOSIS — F32A Depression, unspecified: Secondary | ICD-10-CM

## 2016-10-18 DIAGNOSIS — Z79899 Other long term (current) drug therapy: Secondary | ICD-10-CM | POA: Diagnosis not present

## 2016-10-18 LAB — CBC WITH DIFFERENTIAL/PLATELET
BASOS ABS: 0 10*3/uL (ref 0–0.1)
Basophils Relative: 0 %
EOS PCT: 1 %
Eosinophils Absolute: 0.1 10*3/uL (ref 0–0.7)
HEMATOCRIT: 44.4 % (ref 35.0–47.0)
HEMOGLOBIN: 14.6 g/dL (ref 12.0–16.0)
LYMPHS PCT: 15 %
Lymphs Abs: 1.8 10*3/uL (ref 1.0–3.6)
MCH: 29.3 pg (ref 26.0–34.0)
MCHC: 33 g/dL (ref 32.0–36.0)
MCV: 88.7 fL (ref 80.0–100.0)
Monocytes Absolute: 0.7 10*3/uL (ref 0.2–0.9)
Monocytes Relative: 6 %
NEUTROS ABS: 9.9 10*3/uL — AB (ref 1.4–6.5)
NEUTROS PCT: 78 %
PLATELETS: 290 10*3/uL (ref 150–440)
RBC: 5.01 MIL/uL (ref 3.80–5.20)
RDW: 13 % (ref 11.5–14.5)
WBC: 12.6 10*3/uL — AB (ref 3.6–11.0)

## 2016-10-18 LAB — COMPREHENSIVE METABOLIC PANEL
ALK PHOS: 69 U/L (ref 38–126)
ALT: 38 U/L (ref 14–54)
AST: 42 U/L — AB (ref 15–41)
Albumin: 4 g/dL (ref 3.5–5.0)
Anion gap: 9 (ref 5–15)
BUN: 23 mg/dL — ABNORMAL HIGH (ref 6–20)
CHLORIDE: 98 mmol/L — AB (ref 101–111)
CO2: 21 mmol/L — ABNORMAL LOW (ref 22–32)
Calcium: 9.2 mg/dL (ref 8.9–10.3)
Creatinine, Ser: 1.19 mg/dL — ABNORMAL HIGH (ref 0.44–1.00)
GFR calc Af Amer: 53 mL/min — ABNORMAL LOW (ref >60)
GFR, EST NON AFRICAN AMERICAN: 46 mL/min — AB (ref >60)
Glucose, Bld: 257 mg/dL — ABNORMAL HIGH (ref 65–99)
Potassium: 4.5 mmol/L (ref 3.5–5.1)
Sodium: 128 mmol/L — ABNORMAL LOW (ref 135–145)
Total Bilirubin: 0.7 mg/dL (ref 0.3–1.2)
Total Protein: 7.1 g/dL (ref 6.5–8.1)

## 2016-10-18 LAB — BRAIN NATRIURETIC PEPTIDE: B Natriuretic Peptide: 85 pg/mL (ref 0.0–100.0)

## 2016-10-18 LAB — TROPONIN I

## 2016-10-18 NOTE — Patient Instructions (Signed)
Patient left with 911.

## 2016-10-18 NOTE — ED Provider Notes (Signed)
[facility] Emergency Department Provider Note   ____________________________________________    I have reviewed the triage vital signs and the nursing notes.   HISTORY  Chief Complaint Shortness of Breath     HPI Haley Young is a 66 y.o. female who presents with shortness of breath, now resolved. Patient reports she has been getting B12 shots by her PCP because of depression over the last 6 months. She reports she went to lie down to have her B12 shot and she felt short of breath. She has a history of a pulmonary valve replacement 2005, she reports she has been doing quite well since then, this is a bovine valve, she is followed up at [facility] cardiology. She denies any chest pain or palpitations. She feels quite well asks "why am I here".   Past Medical History:  Diagnosis Date  . Asthma   . Chicken pox   . Depression   . Diabetes mellitus   . Frequent UTI   . GERD (gastroesophageal reflux disease)   . Heart disease   . Heart murmur   . 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 Hx of colonic polyps   . Hyperhidrosis    especially with hot flashes    . Hyperlipidemia   . Lipoma    R axilla   . MRSA cellulitis    surgical wound infection  . Osteoporosis   . Pyelocystitis    as a child   . Thyroid disease   . Urinary incontinence     Patient Active Problem List   Diagnosis Date Noted  . Tachycardia 10/18/2016  . Cough 06/06/2016  . Drug-induced vitamin B12 deficiency anemia 04/16/2016  . Type 2 diabetes mellitus with hyperglycemia, without long-term current use of insulin ([place]) 08/21/2015  . Fatigue 03/27/2015  . Overweight (BMI 25.0-29.9) 10/07/2014  . Adjustment disorder with mixed anxiety and depressed mood 08/08/2014  . Medicare annual wellness visit, initial 09/13/2013  . Hyperlipidemia associated with type 2 diabetes mellitus ([place]) 07/25/2013  . Insomnia 07/25/2013  . Anxiety 03/29/2012  . Hypothyroidism  03/29/2012  . Screening for breast cancer 03/29/2012  . Screening for colon cancer 03/29/2012  . PULMONARY VALVE INSUFFICIENCY 08/13/2008  . GERD 08/13/2008  . Osteopenia 08/13/2008  . COLONIC POLYPS, HX OF 08/13/2008    Past Surgical History:  Procedure Laterality Date  . ABDOMINAL HYSTERECTOMY  1970  . [place], 2005   pulmonary stenosis with valve replacement, Dr. Evelina Dun  . PARTIAL HYSTERECTOMY  1970  . PULMONARY VEIN STENOSIS REPAIR    . VALVE REPLACEMENT      Prior to Admission medications   Medication Sig Start Date End Date Taking? Authorizing Provider  atorvastatin (LIPITOR) 20 MG tablet Take 1 tablet (20 mg total) by mouth daily. 04/27/16  Yes Philemon Kingdom, MD  glipiZIDE (GLUCOTROL XL) 5 MG 24 hr tablet Please take 5 mg in the am before breakfast. Patient taking differently: Take 2.5 mg by mouth. As needed 04/27/16  Yes Philemon Kingdom, MD  JANUVIA 100 MG tablet TAKE ONE TABLET BY MOUTH ONCE DAILY BEFORE BREAKFAST 07/21/16  Yes Philemon Kingdom, MD  levothyroxine (SYNTHROID, LEVOTHROID) 125 MCG tablet Take 1 tablet (125 mcg total) by mouth daily. 01/26/16  Yes Philemon Kingdom, MD  LORazepam (ATIVAN) 1 MG tablet Take 1 tablet (1 mg total) by mouth 2 (two) times daily as needed. for anxiety 03/17/16  Yes [name], MD  metFORMIN (GLUCOPHAGE)  1000 MG tablet TAKE ONE TABLET BY MOUTH TWICE DAILY WITH MEALS 09/13/16  Yes Philemon Kingdom, MD  traZODone (DESYREL) 150 MG tablet Take 1 tablet (150 mg total) by mouth at bedtime. 04/14/16  Yes [name], MD  amoxicillin (AMOXIL) 500 MG capsule Take by mouth. 09/21/16   Historical Provider, MD  aspirin 81 MG tablet Take 81 mg by mouth daily.      Historical Provider, MD  chlorpheniramine-HYDROcodone (TUSSIONEX PENNKINETIC ER) 10-8 MG/5ML SUER Take 5 mLs by mouth every 12 (twelve) hours as needed. Patient not taking: Reported on 10/18/2016 06/06/16   Coral Spikes, DO  Cholecalciferol (VITAMIN D3) 2000 UNITS TABS Take  2,000 Units by mouth daily.    Historical Provider, MD  glucose blood (ONE TOUCH ULTRA TEST) test strip USE ONE STRIP TO CHECK GLUCOSE THREE TIMES DAILY AS DIRECTED E11.65 Patient not taking: Reported on 10/18/2016 09/06/16   Philemon Kingdom, MD  Omega-3 Fatty Acids (FISH OIL) 1200 MG CAPS Take 1,200 mg by mouth daily.    Historical Provider, MD     Allergies Oxycodone hcl; Albuterol; and Oxycodone hcl  Family History  Problem Relation Age of Onset  . Coarctation of the aorta Sister   . Diabetes Mother   . COPD Mother   . Obesity Mother   . Depression Mother   . Alcohol abuse Father   . Cirrhosis Father   . Diabetes Sister   . Down syndrome Brother   . Down syndrome Daughter   . Lupus Daughter   . Ulcerative colitis Daughter     Social History Social History  Substance Use Topics  . Smoking status: Never Smoker  . Smokeless tobacco: Never Used  . Alcohol use Yes     Comment: occasional wine     Review of Systems  Constitutional: No fever/chills Eyes: No visual changes.   Cardiovascular: Denies chest pain. Respiratory: As above Gastrointestinal: No abdominal pain.  No nausea, no vomiting.    Musculoskeletal: Negative for back pain. Skin: Negative for rash. Neurological: Negative for headaches or weakness  10-point ROS otherwise negative.  ____________________________________________   PHYSICAL EXAM:  VITAL SIGNS: ED Triage Vitals [10/18/16 1233]  Enc Vitals Group     BP      Pulse      Resp      Temp      Temp src      SpO2      Weight 135 lb (61.2 kg)     Height 5\' 1"  (1.549 m)     Head Circumference      Peak Flow      Pain Score 0     Pain Loc      Pain Edu?      Excl. in [place]?     Constitutional: Alert and oriented. No acute distress. Pleasant and interactive Eyes: Conjunctivae are normal.   Nose: No congestion/rhinnorhea. Mouth/Throat: Mucous membranes are moist.    Cardiovascular: Normal rate, regular rhythm. Systolic murmur over the  pulmonary valve.  Good peripheral circulation. Respiratory: Normal respiratory effort.  No retractions. Lungs CTAB. Gastrointestinal: Soft and nontender. No distention.  No CVA tenderness. Genitourinary: deferred Musculoskeletal: No lower extremity tenderness nor edema.  Warm and well perfused Neurologic:  Normal speech and language. No gross focal neurologic deficits are appreciated.  Skin:  Skin is warm, dry and intact. No rash noted. Psychiatric: Mood and affect are normal. Speech and behavior are normal.  ____________________________________________   LABS (all labs ordered are listed,  but only abnormal results are displayed)  Labs Reviewed  CBC WITH DIFFERENTIAL/PLATELET - Abnormal; Notable for the following:       Result Value   WBC 12.6 (*)    Neutro Abs 9.9 (*)    All other components within normal limits  COMPREHENSIVE METABOLIC PANEL - Abnormal; Notable for the following:    Sodium 128 (*)    Chloride 98 (*)    CO2 21 (*)    Glucose, Bld 257 (*)    BUN 23 (*)    Creatinine, Ser 1.19 (*)    AST 42 (*)    GFR calc non Af Amer 46 (*)    GFR calc Af Amer 53 (*)    All other components within normal limits  TROPONIN I  BRAIN NATRIURETIC PEPTIDE   ____________________________________________  EKG  ED ECG REPORT I, Lavonia Drafts, the attending physician, personally viewed and interpreted this ECG.   Date: 10/18/2016   Rate: 113  Rhythm: sinus tachycardia   Intervals:nonspecific intraventricular conduction delay  ST&T Change: Nonspecific  Compared to EKG from 2013, no significant change  ____________________________________________  RADIOLOGY  Unremarkable chest x-ray  ____________________________________________   PROCEDURES  Procedure(s) performed: No    Critical Care performed: No ____________________________________________   INITIAL IMPRESSION / ASSESSMENT AND PLAN / ED COURSE  Pertinent labs & imaging results that were available during  my care of the patient were reviewed by me and considered in my medical decision making (see chart for details).  Patient well-appearing and in no acute distress. Lab work is reassuring, chest x-ray is benign. Patient has outpatient follow-up with her cardiologist and does not want to stay in the hospital. I feel this is reasonable given that she is asymptomatic. She will call her cardiologist tomorrow. Patient also discussed depressive symptoms with me, no SI or HI, she asked for a recommendation for outpatient psychiatrist which was provided.    ____________________________________________   FINAL CLINICAL IMPRESSION(S) / ED DIAGNOSES  Final diagnoses:  Shortness of breath  Depression, unspecified depression type      NEW MEDICATIONS STARTED DURING THIS VISIT:  New Prescriptions   No medications on file     Note:  This document was prepared using Dragon voice recognition software and may include unintentional dictation errors.    Lavonia Drafts, MD 10/18/16 [phone]

## 2016-10-18 NOTE — ED Notes (Signed)
Sandwich tray given 

## 2016-10-18 NOTE — Telephone Encounter (Signed)
Not until I see her

## 2016-10-18 NOTE — ED Triage Notes (Signed)
Pt arrived via ems for report of shortness of breath - pt went to PCP for vit b12 injection and when she laid down she reported she became short of breath (she also reports this is normal for her and has been ongoing for some time) - ems reported irregular HR in 120's - at this time pt respirations are even and unlabored with no distress noted - pt denies any pain

## 2016-10-18 NOTE — Progress Notes (Signed)
Subjective:    Patient ID: Haley Young, female    DOB: Aug 04, 1950, 66 y.o.   MRN: [id number]  CC: ALLYSSIA SKLUZACEK is a 66 y.o. female who presents today for an acute visit.    HPI: Depression for several weeks.  Feels 'very low.' Endorses fatigue, mild HA ( resolved today).  Sleeps well on trazodone and lorezepam.  b12 injections are not helping.  Worried about lady at [employer] and personal health, pulmonary valve. Also worried about son who has autism. No thoughts of hurting herself or anyone else.   No CP, SOB, palpitations.   Follows with Dr. Leonides Schanz cardiologist at [facility] for leaking pulmonary valve. Follows with MRI.     HISTORY:  Past Medical History:  Diagnosis Date  . Asthma   . Chicken pox   . Depression   . Diabetes mellitus   . Frequent UTI   . GERD (gastroesophageal reflux disease)   . Heart disease   . Heart murmur   . 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 Hx of colonic polyps   . Hyperhidrosis    especially with hot flashes    . Hyperlipidemia   . Lipoma    R axilla   . MRSA cellulitis    surgical wound infection  . Osteoporosis   . Pyelocystitis    as a child   . Thyroid disease   . Urinary incontinence    Past Surgical History:  Procedure Laterality Date  . ABDOMINAL HYSTERECTOMY  1970  . [place], 2005   pulmonary stenosis with valve replacement, Dr. Evelina Dun  . PARTIAL HYSTERECTOMY  1970  . PULMONARY VEIN STENOSIS REPAIR    . VALVE REPLACEMENT     Family History  Problem Relation Age of Onset  . Coarctation of the aorta Sister   . Diabetes Mother   . COPD Mother   . Obesity Mother   . Depression Mother   . Alcohol abuse Father   . Cirrhosis Father   . Diabetes Sister   . Down syndrome Brother   . Down syndrome Daughter   . Lupus Daughter   . Ulcerative colitis Daughter     Allergies: Oxycodone hcl; Albuterol; and Oxycodone hcl Current Outpatient Prescriptions on File Prior to Visit  Medication Sig  Dispense Refill  . amoxicillin (AMOXIL) 500 MG capsule Take by mouth.    Marland Kitchen aspirin 81 MG tablet Take 81 mg by mouth daily.      Marland Kitchen atorvastatin (LIPITOR) 20 MG tablet Take 1 tablet (20 mg total) by mouth daily. 90 tablet 1  . chlorpheniramine-HYDROcodone (TUSSIONEX PENNKINETIC ER) 10-8 MG/5ML SUER Take 5 mLs by mouth every 12 (twelve) hours as needed. 115 mL 0  . Cholecalciferol (VITAMIN D3) 2000 UNITS TABS Take 2,000 Units by mouth daily.    Marland Kitchen glipiZIDE (GLUCOTROL XL) 5 MG 24 hr tablet Please take 5 mg in the am before breakfast. 90 tablet 1  . glucose blood (ONE TOUCH ULTRA TEST) test strip USE ONE STRIP TO CHECK GLUCOSE THREE TIMES DAILY AS DIRECTED E11.65 200 each 2  . JANUVIA 100 MG tablet TAKE ONE TABLET BY MOUTH ONCE DAILY BEFORE BREAKFAST 30 tablet 2  . levothyroxine (SYNTHROID, LEVOTHROID) 125 MCG tablet Take 1 tablet (125 mcg total) by mouth daily. 90 tablet 3  . LORazepam (ATIVAN) 1 MG tablet Take 1 tablet (1 mg total) by mouth 2 (two) times daily as needed. for anxiety 60 tablet 0  .  metFORMIN (GLUCOPHAGE) 1000 MG tablet TAKE ONE TABLET BY MOUTH TWICE DAILY WITH MEALS 180 tablet 0  . Omega-3 Fatty Acids (FISH OIL) 1200 MG CAPS Take 1,200 mg by mouth daily.    . traZODone (DESYREL) 150 MG tablet Take 1 tablet (150 mg total) by mouth at bedtime. 90 tablet 1   No current facility-administered medications on file prior to visit.     Social History  Substance Use Topics  . Smoking status: Never Smoker  . Smokeless tobacco: Never Used  . Alcohol use Yes     Comment: occasional wine     Review of Systems  Constitutional: Positive for fatigue. Negative for chills and fever.  Eyes: Negative for visual disturbance.  Respiratory: Positive for shortness of breath. Negative for cough and wheezing.   Cardiovascular: Negative for chest pain, palpitations and leg swelling.  Gastrointestinal: Negative for nausea and vomiting.  Neurological: Positive for dizziness. Negative for headaches.    Psychiatric/Behavioral: Negative for sleep disturbance and suicidal ideas. The patient is not nervous/anxious.       Objective:    BP (!) 96/54   Pulse (!) 126   Temp 98.3 F (36.8 C) (Oral)   Ht 5\' 2"  (1.575 m)   Wt 135 lb 9.6 oz (61.5 kg)   SpO2 98%   BMI 24.80 kg/m    Physical Exam  Constitutional: She appears well-developed and well-nourished.  Eyes: Conjunctivae are normal.  Cardiovascular: Normal heart sounds and normal pulses.  A regularly irregular rhythm present. Tachycardia present.   Pulmonary/Chest: Effort normal and breath sounds normal. She has no wheezes. She has no rhonchi. She has no rales.  Neurological: She is alert.  Skin: Skin is warm and dry.  Psychiatric: She has a normal mood and affect. Her speech is normal and behavior is normal. Thought content normal.  Vitals reviewed.      Assessment & Plan:  1. Tachycardia Irregular rhythm. Tachycardia to 130s. Concern for afib RVR in context of pulmonary valve insufficiency. When attempted EKG, patient became SOB when lying down and unable to get EKG. We immediately called 911. Report given to [facility] triage.    - EKG 12-Lead     I have discontinued Ms. Cahalan's FLUoxetine. I am also having her maintain her aspirin, Fish Oil, Vitamin D3, levothyroxine, LORazepam, traZODone, glipiZIDE, atorvastatin, chlorpheniramine-HYDROcodone, JANUVIA, glucose blood, metFORMIN, and amoxicillin.   No orders of the defined types were placed in this encounter.   Return precautions given.   Risks, benefits, and alternatives of the medications and treatment plan prescribed today were discussed, and patient expressed understanding.   Education regarding symptom management and diagnosis given to patient on AVS.  Continue to follow with TULLO, Aris Everts, MD for routine health maintenance.   Haley Young and I agreed with plan.   Mable Paris, FNP

## 2016-10-18 NOTE — ED Notes (Signed)
Pt arrived via ems for report of shortness of breath - pt went to PCP for vit b12 injection and when she laid down she reported she became short of breath (she also reports this is normal for her and has been ongoing for some time) - ems reported irregular HR in 120's - at this time pt respirations are even and unlabored with no distress noted - pt denies any pain

## 2016-10-18 NOTE — Progress Notes (Signed)
Pre visit review using our clinic review tool, if applicable. No additional management support is needed unless otherwise documented below in the visit note. 

## 2016-10-25 ENCOUNTER — Telehealth: Payer: Self-pay | Admitting: Internal Medicine

## 2016-10-25 NOTE — Telephone Encounter (Signed)
Pt wanted to know if she still needed to do b12 after her ER visit.Marland Kitchen Please advise

## 2016-10-26 NOTE — Telephone Encounter (Signed)
Can you schedule this pt for an ER follow up on Monday at 11:30am? Thank you!!!

## 2016-10-26 NOTE — Telephone Encounter (Signed)
With it being that she was in the ER for depression and SOBr how soon do you want to see her? You have an 11:30am tomorrow or an 11:30am on Monday?

## 2016-10-26 NOTE — Telephone Encounter (Signed)
MONDAY

## 2016-10-26 NOTE — Telephone Encounter (Signed)
Please advise 

## 2016-10-26 NOTE — Telephone Encounter (Signed)
Done  Patient aware right ?

## 2016-10-26 NOTE — Telephone Encounter (Signed)
She Needs an ER follow up visit to review what happened.she can switch to oral OTC B12 1000 mcg daily  and we will determine if she needs to continue supplements at her appointment.

## 2016-10-28 ENCOUNTER — Other Ambulatory Visit: Payer: Self-pay | Admitting: Internal Medicine

## 2016-10-28 ENCOUNTER — Telehealth: Payer: Self-pay | Admitting: Internal Medicine

## 2016-10-28 ENCOUNTER — Encounter: Payer: Self-pay | Admitting: Internal Medicine

## 2016-10-28 NOTE — Telephone Encounter (Signed)
sent 

## 2016-10-28 NOTE — Telephone Encounter (Signed)
Pt is requesting to have her traZODone (DESYREL) 150 MG tablet refilled.

## 2016-10-31 ENCOUNTER — Ambulatory Visit (INDEPENDENT_AMBULATORY_CARE_PROVIDER_SITE_OTHER): Payer: Medicare Other | Admitting: Internal Medicine

## 2016-10-31 ENCOUNTER — Encounter: Payer: Self-pay | Admitting: Internal Medicine

## 2016-10-31 ENCOUNTER — Ambulatory Visit: Payer: Medicare Other | Admitting: Internal Medicine

## 2016-10-31 VITALS — BP 98/74 | HR 86 | Temp 97.6°F | Resp 15 | Ht 61.0 in | Wt 138.6 lb

## 2016-10-31 DIAGNOSIS — E871 Hypo-osmolality and hyponatremia: Secondary | ICD-10-CM

## 2016-10-31 DIAGNOSIS — D72823 Leukemoid reaction: Secondary | ICD-10-CM | POA: Diagnosis not present

## 2016-10-31 DIAGNOSIS — F418 Other specified anxiety disorders: Secondary | ICD-10-CM

## 2016-10-31 MED ORDER — ESCITALOPRAM OXALATE 5 MG PO TABS: 5.0000 mg | ORAL_TABLET | Freq: Every day | ORAL | 0 refills | Status: DC

## 2016-10-31 NOTE — Progress Notes (Signed)
Subjective:  Patient ID: Haley Young, female    DOB: 04-13-1951  Age: 66 y.o. MRN: [id number]  CC: The primary encounter diagnosis was Hyponatremia. Diagnoses of Leukemoid reaction and Depression with anxiety were also pertinent to this visit.  HPI [name] presents for ER follow up .  Sent to ED by Margarett for evaluation of suspected atrial fib with RVR and orthopnea preventing eval with EKG in office   In ED patient did not appear to understand why she was there, denied orthopnea,  HR was 113 ,  No atrial fib by EKG.  Chest x ray , cardiac enzymes BNP were all noral  , but hyponatremia and hyperglycemia were noted.  She was given IV fluids and sent home.   History of pulmonary valve replacement in2005. States she always feels short of breath in supine position due to pulmonary hypertension.   Sugars have been labile,  But her diabetes is under control and managed by Dr. Renne Crigler   Cc:  GAD/depression:  No longer taking Prozac after years, bc she felt it was no longer  helping , but feels she does need medication to handle her anxiety.  Had a difficult childhood, both siblings affected by Down's syndrome, hass compensated by adopting and having her own children, all of  whom  have physical ,emotional or mental disabilities which at times overwhelm her . they are all adults.    Weight loss unintentional ,  Occurred as a result of having  9 teeth pulled to get denture, still having a lot of dental pain  Fears getting old bc of her children that are both afflicted and take up a lot of her time. (dtr has Down's,  Haley Young has autism, lives alone).  Has insomnia chronic,  But managed with trazodone.   Brother Merry Proud died 2 years ago from respiratory failure (had down's syndrome),  Her sister Armen Pickup died 46 yrs ago,  Mother 5 yrs ago.     Outpatient Medications Prior to Visit  Medication Sig Dispense Refill  . atorvastatin (LIPITOR) 20 MG tablet Take 1 tablet (20 mg total) by mouth daily. 90  tablet 1  . chlorpheniramine-HYDROcodone (TUSSIONEX PENNKINETIC ER) 10-8 MG/5ML SUER Take 5 mLs by mouth every 12 (twelve) hours as needed. 115 mL 0  . glipiZIDE (GLUCOTROL XL) 5 MG 24 hr tablet Please take 5 mg in the am before breakfast. (Patient taking differently: Take 2.5 mg by mouth. As needed) 90 tablet 1  . glucose blood (ONE TOUCH ULTRA TEST) test strip USE ONE STRIP TO CHECK GLUCOSE THREE TIMES DAILY AS DIRECTED E11.65 200 each 2  . JANUVIA 100 MG tablet TAKE ONE TABLET BY MOUTH ONCE DAILY BEFORE BREAKFAST 30 tablet 2  . levothyroxine (SYNTHROID, LEVOTHROID) 125 MCG tablet Take 1 tablet (125 mcg total) by mouth daily. 90 tablet 3  . LORazepam (ATIVAN) 1 MG tablet Take 1 tablet (1 mg total) by mouth 2 (two) times daily as needed. for anxiety 60 tablet 0  . metFORMIN (GLUCOPHAGE) 1000 MG tablet TAKE ONE TABLET BY MOUTH TWICE DAILY WITH MEALS 180 tablet 0  . traZODone (DESYREL) 150 MG tablet TAKE ONE TABLET BY MOUTH AT BEDTIME 90 tablet 1  . amoxicillin (AMOXIL) 500 MG capsule Take by mouth.    Marland Kitchen aspirin 81 MG tablet Take 81 mg by mouth daily.      . Cholecalciferol (VITAMIN D3) 2000 UNITS TABS Take 2,000 Units by mouth daily.    . Omega-3 Fatty Acids (  FISH OIL) 1200 MG CAPS Take 1,200 mg by mouth daily.     No facility-administered medications prior to visit.     Review of Systems;  Patient denies headache, fevers, malaise, unintentional weight loss, skin rash, eye pain, sinus congestion and sinus pain, sore throat, dysphagia,  hemoptysis , cough, dyspnea, wheezing, chest pain, palpitations, orthopnea, edema, abdominal pain, nausea, melena, diarrhea, constipation, flank pain, dysuria, hematuria, urinary  Frequency, nocturia, numbness, tingling, seizures,  Focal weakness, Loss of consciousness,  Tremor, insomnia,  and suicidal ideation.      Objective:  BP 98/74   Pulse 86   Temp 97.6 F (36.4 C) (Oral)   Resp 15   Ht 5\' 1"  (1.549 m)   Wt 138 lb 9.6 oz (62.9 kg)   SpO2 97%    BMI 26.19 kg/m   BP Readings from Last 3 Encounters:  10/31/16 98/74  10/18/16 131/68  10/18/16 (!) 96/54    Wt Readings from Last 3 Encounters:  10/31/16 138 lb 9.6 oz (62.9 kg)  10/18/16 135 lb (61.2 kg)  10/18/16 135 lb 9.6 oz (61.5 kg)    General appearance: alert, cooperative and appears stated age Neck: no adenopathy, no carotid bruit, supple, symmetrical, trachea midline and thyroid not enlarged, symmetric, no tenderness/mass/nodules Back: symmetric, no curvature. ROM normal. No CVA tenderness. Lungs: clear to auscultation bilaterally Heart: regular rate and rhythm, S1, S2 normal, no murmur, click, rub or gallop Abdomen: soft, non-tender; bowel sounds normal; no masses,  no organomegaly Pulses: 2+ and symmetric Skin: Skin color, texture, turgor normal. No rashes or lesions Lymph nodes: Cervical, supraclavicular, and axillary nodes normal. Psych: affect normal, makes good eye contact. No fidgeting,  Smiles easily.  Denies suicidal thoughts   Lab Results  Component Value Date   HGBA1C 9.1 08/21/2015   HGBA1C 7.5 (H) 03/12/2015   HGBA1C 7.9 (H) 09/09/2014    Lab Results  Component Value Date   CREATININE 1.19 (H) 10/18/2016   CREATININE 1.03 (H) 04/26/2016   CREATININE 0.93 04/18/2016    Lab Results  Component Value Date   WBC 12.6 (H) 10/18/2016   HGB 14.6 10/18/2016   HCT 44.4 10/18/2016   PLT 290 10/18/2016   GLUCOSE 257 (H) 10/18/2016   CHOL 239 (H) 04/14/2016   TRIG 162.0 (H) 04/14/2016   HDL 57.90 04/14/2016   LDLDIRECT 165.0 04/14/2016   LDLCALC 149 (H) 04/14/2016   ALT 38 10/18/2016   AST 42 (H) 10/18/2016   NA 128 (L) 10/18/2016   K 4.5 10/18/2016   CL 98 (L) 10/18/2016   CREATININE 1.19 (H) 10/18/2016   BUN 23 (H) 10/18/2016   CO2 21 (L) 10/18/2016   TSH 1.98 04/14/2016   HGBA1C 9.1 08/21/2015   MICROALBUR 3.9 (H) 04/14/2016    Dg Chest 2 View  Result Date: 10/18/2016 CLINICAL DATA:  Shortness of breath. EXAM: CHEST  2 VIEW COMPARISON:   Radiographs of August 24, 2011. FINDINGS: Stable cardiomediastinal silhouette. Status post cardiac valve repair. No pneumothorax or pleural effusion is noted. Stable right apical granuloma. No acute pulmonary disease is noted. Dextroscoliosis of lower thoracic spine is noted. IMPRESSION: No active cardiopulmonary disease. Electronically Signed   By: Marijo Conception, M.D.   On: 10/18/2016 13:29    Assessment & Plan:   Problem List Items Addressed This Visit    Depression with anxiety    Chronic,  With aggravation of symptoms since stopping prozac .  Starting lexapro at 5 mg daily, in crease to 10 mg  after one week.        Hyponatremia - Primary    Partly pseudohypnatrmia from elevated bs,  But patient did appear dehydrated during ER visit and  was given IV fluids.  rechecking today  Lab Results  Component Value Date   NA 128 (L) 10/18/2016   K 4.5 10/18/2016   CL 98 (L) 10/18/2016   CO2 21 (L) 10/18/2016         Relevant Orders   Basic metabolic panel    Other Visit Diagnoses    Leukemoid reaction       Relevant Orders   CBC with Differential/Platelet      I have discontinued Ms. Kennebrew's aspirin, Fish Oil, and Vitamin D3. I am also having her start on escitalopram. Additionally, I am having her maintain her levothyroxine, LORazepam, glipiZIDE, atorvastatin, chlorpheniramine-HYDROcodone, JANUVIA, glucose blood, metFORMIN, amoxicillin, and traZODone.  Meds ordered this encounter  Medications  . escitalopram (LEXAPRO) 5 MG tablet    Sig: Take 1 tablet (5 mg total) by mouth daily.    Dispense:  90 tablet    Refill:  0    Medications Discontinued During This Encounter  Medication Reason  . aspirin 81 MG tablet Patient has not taken in last 30 days  . Cholecalciferol (VITAMIN D3) 2000 UNITS TABS Patient has not taken in last 30 days  . Omega-3 Fatty Acids (FISH OIL) 1200 MG CAPS Patient has not taken in last 30 days    Follow-up: No Follow-up on file.   [name], MD

## 2016-10-31 NOTE — Patient Instructions (Signed)
Please start the Lexapro (escitalopram) at 1/2 tablet daily in the evening for the first few days to avoid nausea.  You can increase to a full tablet after 4 days if you havenot developed side effects of nausea.  You can increase to 2 tablets daily afer 2 weeks of the 1 tablet daily   If the lexapro interferes with your sleep, take it in the morning instead  Please  e mail me to let me know how it is helping your depression

## 2016-10-31 NOTE — Progress Notes (Signed)
Pre visit review using our clinic review tool, if applicable. No additional management support is needed unless otherwise documented below in the visit note. 

## 2016-11-01 ENCOUNTER — Other Ambulatory Visit: Payer: Self-pay | Admitting: Internal Medicine

## 2016-11-01 ENCOUNTER — Encounter: Payer: Self-pay | Admitting: Internal Medicine

## 2016-11-01 DIAGNOSIS — E875 Hyperkalemia: Secondary | ICD-10-CM

## 2016-11-01 DIAGNOSIS — E871 Hypo-osmolality and hyponatremia: Secondary | ICD-10-CM | POA: Insufficient documentation

## 2016-11-01 LAB — BASIC METABOLIC PANEL
BUN: 24 mg/dL — ABNORMAL HIGH (ref 6–23)
CHLORIDE: 100 meq/L (ref 96–112)
CO2: 28 meq/L (ref 19–32)
Calcium: 9.7 mg/dL (ref 8.4–10.5)
Creatinine, Ser: 0.93 mg/dL (ref 0.40–1.20)
GFR: 63.67 mL/min (ref >60.00)
GLUCOSE: 106 mg/dL — AB (ref 70–99)
POTASSIUM: 5.4 meq/L — AB (ref 3.5–5.1)
SODIUM: 133 meq/L — AB (ref 135–145)

## 2016-11-01 LAB — CBC WITH DIFFERENTIAL/PLATELET
BASOS ABS: 0.1 10*3/uL (ref 0.0–0.1)
BASOS PCT: 0.9 % (ref 0.0–3.0)
EOS ABS: 0.2 10*3/uL (ref 0.0–0.7)
Eosinophils Relative: 2.4 % (ref 0.0–5.0)
HCT: 42.2 % (ref 36.0–46.0)
Hemoglobin: 14.1 g/dL (ref 12.0–15.0)
LYMPHS PCT: 22 % (ref 12.0–46.0)
Lymphs Abs: 2 10*3/uL (ref 0.7–4.0)
MCHC: 33.4 g/dL (ref 30.0–36.0)
MCV: 90 fl (ref 78.0–100.0)
MONO ABS: 0.8 10*3/uL (ref 0.1–1.0)
Monocytes Relative: 8.3 % (ref 3.0–12.0)
NEUTROS ABS: 6.1 10*3/uL (ref 1.4–7.7)
Neutrophils Relative %: 66.4 % (ref 43.0–77.0)
PLATELETS: 271 10*3/uL (ref 150.0–400.0)
RBC: 4.69 Mil/uL (ref 3.87–5.11)
RDW: 13.5 % (ref 11.5–15.5)
WBC: 9.2 10*3/uL (ref 4.0–10.5)

## 2016-11-01 NOTE — Assessment & Plan Note (Signed)
Repeat in one week

## 2016-11-01 NOTE — Assessment & Plan Note (Signed)
Partly pseudohypnatrmia from elevated bs,  But patient did appear dehydrated during ER visit and  was given IV fluids.  rechecking today  Lab Results  Component Value Date   NA 128 (L) 10/18/2016   K 4.5 10/18/2016   CL 98 (L) 10/18/2016   CO2 21 (L) 10/18/2016

## 2016-11-01 NOTE — Assessment & Plan Note (Signed)
Chronic,  With aggravation of symptoms since stopping prozac .  Starting lexapro at 5 mg daily, in crease to 10 mg after one week.

## 2016-11-01 NOTE — Assessment & Plan Note (Signed)
persisent since death of her brother in 07-Nov-2014.  Continue trazodone.

## 2016-11-07 ENCOUNTER — Other Ambulatory Visit (INDEPENDENT_AMBULATORY_CARE_PROVIDER_SITE_OTHER): Payer: Medicare Other

## 2016-11-07 DIAGNOSIS — E875 Hyperkalemia: Secondary | ICD-10-CM

## 2016-11-08 ENCOUNTER — Encounter: Payer: Self-pay | Admitting: Internal Medicine

## 2016-11-08 LAB — BASIC METABOLIC PANEL
BUN: 22 mg/dL (ref 6–23)
CHLORIDE: 101 meq/L (ref 96–112)
CO2: 27 mEq/L (ref 19–32)
CREATININE: 1 mg/dL (ref 0.40–1.20)
Calcium: 9.5 mg/dL (ref 8.4–10.5)
GFR: 58.55 mL/min — ABNORMAL LOW (ref >60.00)
Glucose, Bld: 142 mg/dL — ABNORMAL HIGH (ref 70–99)
POTASSIUM: 5 meq/L (ref 3.5–5.1)
Sodium: 134 mEq/L — ABNORMAL LOW (ref 135–145)

## 2016-11-18 DIAGNOSIS — Q221 Congenital pulmonary valve stenosis: Secondary | ICD-10-CM | POA: Diagnosis not present

## 2016-11-18 DIAGNOSIS — I371 Nonrheumatic pulmonary valve insufficiency: Secondary | ICD-10-CM | POA: Diagnosis not present

## 2016-11-18 DIAGNOSIS — I10 Essential (primary) hypertension: Secondary | ICD-10-CM | POA: Diagnosis not present

## 2016-11-22 ENCOUNTER — Other Ambulatory Visit: Payer: Self-pay | Admitting: Internal Medicine

## 2016-11-29 ENCOUNTER — Encounter: Payer: Self-pay | Admitting: Internal Medicine

## 2016-11-29 ENCOUNTER — Other Ambulatory Visit: Payer: Self-pay | Admitting: Internal Medicine

## 2016-11-29 DIAGNOSIS — Q221 Congenital pulmonary valve stenosis: Secondary | ICD-10-CM | POA: Diagnosis not present

## 2016-11-29 DIAGNOSIS — I371 Nonrheumatic pulmonary valve insufficiency: Secondary | ICD-10-CM | POA: Diagnosis not present

## 2016-11-29 MED ORDER — ALOGLIPTIN BENZOATE 25 MG PO TABS: ORAL_TABLET | ORAL | 11 refills | Status: DC

## 2016-11-30 ENCOUNTER — Other Ambulatory Visit: Payer: Self-pay | Admitting: Internal Medicine

## 2016-11-30 ENCOUNTER — Other Ambulatory Visit: Payer: Self-pay

## 2016-11-30 MED ORDER — GLIPIZIDE ER 5 MG PO TB24: ORAL_TABLET | ORAL | 3 refills | Status: DC

## 2016-11-30 MED ORDER — GLIPIZIDE ER 5 MG PO TB24: ORAL_TABLET | ORAL | 1 refills | Status: DC

## 2016-12-01 ENCOUNTER — Encounter: Payer: Self-pay | Admitting: Internal Medicine

## 2016-12-02 ENCOUNTER — Other Ambulatory Visit: Payer: Self-pay

## 2016-12-02 MED ORDER — SITAGLIPTIN PHOSPHATE 100 MG PO TABS: ORAL_TABLET | ORAL | 0 refills | Status: DC

## 2016-12-19 ENCOUNTER — Other Ambulatory Visit: Payer: Self-pay

## 2016-12-19 MED ORDER — SITAGLIPTIN PHOSPHATE 100 MG PO TABS: ORAL_TABLET | ORAL | 1 refills | Status: DC

## 2017-01-04 ENCOUNTER — Other Ambulatory Visit: Payer: Self-pay | Admitting: Internal Medicine

## 2017-01-13 ENCOUNTER — Other Ambulatory Visit: Payer: Self-pay | Admitting: Internal Medicine

## 2017-01-13 NOTE — Telephone Encounter (Signed)
REFILLED

## 2017-01-13 NOTE — Telephone Encounter (Signed)
Patient's last OV was 10/31/16, Last refill was 11/23/2016, #60 with no refills, please advise, thanks

## 2017-01-23 ENCOUNTER — Ambulatory Visit: Payer: Medicare Other | Admitting: Internal Medicine

## 2017-01-31 ENCOUNTER — Telehealth: Payer: Self-pay | Admitting: Internal Medicine

## 2017-01-31 NOTE — Telephone Encounter (Signed)
Spoke with patient, was stung by approximately 6-7 Wasps yesterday, her and her son were looking for something by the front door and disturbed a Wasp nest.  She has one bite on her right arm and the reminder are on the left arm.  Arm is much better today, she said it is red and swollen, to be expected. She has taken 2 aleve and is planning to get some benadryl today.  Slept last night due to husband gave her a topical spray that numbed her arm.  It is sore today, she can move the arms, no issues, just sore.  Advised that I could get her in for another provider today if she wanted and she declined at this time.  I advised that if she needed anything to return a call to the office. thanks

## 2017-01-31 NOTE — Telephone Encounter (Signed)
Pt called and stated that she got stung by 6 or 7 wasps yesterday in her left arm. Pt states that it is painful and it is red and swollen, not hot to the touch. Pt has taken tylenol. Does pt need to be seen? Please advise, thank you!  Call pt @ [phone]

## 2017-02-12 DIAGNOSIS — L0390 Cellulitis, unspecified: Secondary | ICD-10-CM | POA: Diagnosis not present

## 2017-02-12 DIAGNOSIS — T63441A Toxic effect of venom of bees, accidental (unintentional), initial encounter: Secondary | ICD-10-CM | POA: Diagnosis not present

## 2017-02-28 DIAGNOSIS — E119 Type 2 diabetes mellitus without complications: Secondary | ICD-10-CM | POA: Diagnosis not present

## 2017-02-28 LAB — HM DIABETES EYE EXAM

## 2017-05-02 ENCOUNTER — Other Ambulatory Visit: Payer: Self-pay | Admitting: Internal Medicine

## 2017-05-10 ENCOUNTER — Encounter: Payer: Self-pay | Admitting: Family Medicine

## 2017-05-10 ENCOUNTER — Telehealth: Payer: Self-pay

## 2017-05-10 ENCOUNTER — Ambulatory Visit (INDEPENDENT_AMBULATORY_CARE_PROVIDER_SITE_OTHER): Payer: Medicare Other | Admitting: Family Medicine

## 2017-05-10 VITALS — HR 100 | Temp 97.8°F | Wt 145.8 lb

## 2017-05-10 DIAGNOSIS — R0609 Other forms of dyspnea: Secondary | ICD-10-CM

## 2017-05-10 DIAGNOSIS — R000 Tachycardia, unspecified: Secondary | ICD-10-CM | POA: Diagnosis not present

## 2017-05-10 DIAGNOSIS — J984 Other disorders of lung: Secondary | ICD-10-CM

## 2017-05-10 LAB — CBC
HCT: 45.9 % (ref 36.0–46.0)
Hemoglobin: 15.1 g/dL — ABNORMAL HIGH (ref 12.0–15.0)
MCHC: 32.9 g/dL (ref 30.0–36.0)
MCV: 91.4 fl (ref 78.0–100.0)
Platelets: 304 10*3/uL (ref 150.0–400.0)
RBC: 5.02 Mil/uL (ref 3.87–5.11)
RDW: 13.4 % (ref 11.5–15.5)
WBC: 10.3 10*3/uL (ref 4.0–10.5)

## 2017-05-10 LAB — COMPREHENSIVE METABOLIC PANEL
ALBUMIN: 4.5 g/dL (ref 3.5–5.2)
ALK PHOS: 77 U/L (ref 39–117)
ALT: 17 U/L (ref 0–35)
AST: 25 U/L (ref 0–37)
BUN: 26 mg/dL — AB (ref 6–23)
CO2: 25 mEq/L (ref 19–32)
CREATININE: 1.36 mg/dL — AB (ref 0.40–1.20)
Calcium: 10.3 mg/dL (ref 8.4–10.5)
Chloride: 97 mEq/L (ref 96–112)
GFR: 41 mL/min — ABNORMAL LOW (ref >60.00)
GLUCOSE: 86 mg/dL (ref 70–99)
Potassium: 5.2 mEq/L — ABNORMAL HIGH (ref 3.5–5.1)
SODIUM: 133 meq/L — AB (ref 135–145)
TOTAL PROTEIN: 7.9 g/dL (ref 6.0–8.3)
Total Bilirubin: 0.5 mg/dL (ref 0.2–1.2)

## 2017-05-10 LAB — TSH: TSH: 6.36 u[IU]/mL — AB (ref 0.35–4.50)

## 2017-05-10 MED ORDER — FLUTICASONE PROPIONATE HFA 110 MCG/ACT IN AERO
2.0000 | INHALATION_SPRAY | Freq: Two times a day (BID) | RESPIRATORY_TRACT | 12 refills | Status: DC
Start: 2017-05-10 — End: 2018-01-03

## 2017-05-10 NOTE — Telephone Encounter (Signed)
I spoke with pt and the elevated heart rate and felt faint on 05/09/17 while at grocery store; pt did not faint and went home and laid down to rest. Pt did not go anywhere yesterday. Pt did not faint; pt is diabetic but she had eaten so it was not her BS. Pt texted her cardiologist at [facility] this morning and advised of episode yesterday. Card advised pt to see PCP or UC today. Today pt does not feel faint, No CP,SOB or dizziness. Today pts heart rate is 105. Pt wants to keep appt with Glenda Chroman FNP but if condition worsens prior to appt pt will go to ED. FYI  To Glenda Chroman FNP.

## 2017-05-10 NOTE — Patient Instructions (Addendum)
Please follow up your shortness of breath with Dr. Derrel Nip in 2-3 weeks  I have sent you a prescription for an inhaled steroid- please use 2 puffs twice a day   How to Use a Metered Dose Inhaler A metered dose inhaler is a handheld device for taking medicine that must be breathed into the lungs (inhaled). The device can be used to deliver a variety of inhaled medicines, including:  Quick relief or rescue medicines, such as bronchodilators.  Controller medicines, such as corticosteroids.  The medicine is delivered by pushing down on a metal canister to release a preset amount of spray and medicine. Each device contains the amount of medicine that is needed for a preset number of uses (inhalations). Your health care provider may recommend that you use a spacer with your inhaler to help you take the medicine more effectively. A spacer is a plastic tube with a mouthpiece on one end and an opening that connects to the inhaler on the other end. A spacer holds the medicine in a tube for a short time, which allows you to inhale more medicine. What are the risks? If you do not use your inhaler correctly, medicine might not reach your lungs to help you breathe. Inhaler medicine can cause side effects, such as:  Mouth or throat infection.  Cough.  Hoarseness.  Headache.  Nausea and vomiting.  Lung infection (pneumonia) in people who have a lung condition called COPD.  How to use a metered dose inhaler without a spacer 1. Remove the cap from the inhaler. 2. If you are using the inhaler for the first time, shake it for 5 seconds, turn it away from your face, then release 4 puffs into the air. This is called priming. 3. Shake the inhaler for 5 seconds. 4. Position the inhaler so the top of the canister faces up. 5. Put your index finger on the top of the medicine canister. Support the bottom of the inhaler with your thumb. 6. Breathe out normally and as completely as possible, away from the  inhaler. 7. Either place the inhaler between your teeth and close your lips tightly around the mouthpiece, or hold the inhaler 1-2 inches (2.5-5 cm) away from your open mouth. Keep your tongue down out of the way. If you are unsure which technique to use, ask your health care provider. 8. Press the canister down with your index finger to release the medicine, then inhale deeply and slowly through your mouth (not your nose) until your lungs are completely filled. Inhaling should take 4-6 seconds. 9. Hold the medicine in your lungs for 5-10 seconds (10 seconds is best). This helps the medicine get into the small airways of your lungs. 10. With your lips in a tight circle (pursed), breathe out slowly. 11. Repeat steps 3-10 until you have taken the number of puffs that your health care provider directed. Wait about 1 minute between puffs or as directed. 12. Put the cap on the inhaler. 13. If you are using a steroid inhaler, rinse your mouth with water, gargle, and spit out the water. Do not swallow the water. How to use a metered dose inhaler with a spacer 1. Remove the cap from the inhaler. 2. If you are using the inhaler for the first time, shake it for 5 seconds, turn it away from your face, then release 4 puffs into the air. This is called priming. 3. Shake the inhaler for 5 seconds. 4. Place the open end of the spacer onto  the inhaler mouthpiece. 5. Position the inhaler so the top of the canister faces up and the spacer mouthpiece faces you. 6. Put your index finger on the top of the medicine canister. Support the bottom of the inhaler and the spacer with your thumb. 7. Breathe out normally and as completely as possible, away from the spacer. 8. Place the spacer between your teeth and close your lips tightly around it. Keep your tongue down out of the way. 9. Press the canister down with your index finger to release the medicine, then inhale deeply and slowly through your mouth (not your nose)  until your lungs are completely filled. Inhaling should take 4-6 seconds. 10. Hold the medicine in your lungs for 5-10 seconds (10 seconds is best). This helps the medicine get into the small airways of your lungs. 11. With your lips in a tight circle (pursed), breathe out slowly. 12. Repeat steps 3-11 until you have taken the number of puffs that your health care provider directed. Wait about 1 minute between puffs or as directed. 13. Remove the spacer from the inhaler and put the cap on the inhaler. 14. If you are using a steroid inhaler, rinse your mouth with water, gargle, and spit out the water. Do not swallow the water. Follow these instructions at home:  Take your inhaled medicine only as told by your health care provider. Do not use the inhaler more than directed by your health care provider.  Keep all follow-up visits as told by your health care provider. This is important.  If your inhaler has a counter, you can check it to determine how full your inhaler is. If your inhaler does not have a counter, ask your health care provider when you will need to refill your inhaler and write the refill date on a calendar or on your inhaler canister. Note that you cannot know when an inhaler is empty by shaking it.  Follow directions on the package insert for care and cleaning of your inhaler and spacer. Contact a health care provider if:  Symptoms are only partially relieved with your inhaler.  You are having trouble using your inhaler.  You have an increase in phlegm.  You have headaches. Get help right away if:  You feel little or no relief after using your inhaler.  You have dizziness.  You have a fast heart rate.  You have chills or a fever.  You have night sweats.  There is blood in your phlegm. Summary  A metered dose inhaler is a handheld device for taking medicine that must be breathed into the lungs (inhaled).  The medicine is delivered by pushing down on a metal  canister to release a preset amount of spray and medicine.  Each device contains the amount of medicine that is needed for a preset number of uses (inhalations). This information is not intended to replace advice given to you by your health care provider. Make sure you discuss any questions you have with your health care provider. Document Released: 06/27/2005 Document Revised: 05/17/2016 Document Reviewed: 05/17/2016 Elsevier Interactive Patient Education  2017 [employer].

## 2017-05-10 NOTE — Progress Notes (Signed)
Subjective:    Patient ID: Haley Young, female    DOB: [birth year], 66 y.o.   MRN: [id number]  HPI This is a 66 yo female who presents today with elevated HR and feeling faint. Yesterday she felt faint while at the grocery store. HR was 157 and O2 was 98. Lasted for awhile, gradually decreased to 133, then 105. She notified her [place] Cardiologist who instructed her to see her PCP or go to UC. Her PCP, Dr. Derrel Nip did not have any appointments. Had Holter monitor about 1 year ago. Didn't show any problems.  Has DOE for "long time." Was previously on inhaler but is not currently, has had pulmonary appointment at [facility] in past, not sure about diagnosis? COPD.  Past Medical History:  Diagnosis Date  . Asthma   . Chicken pox   . Depression   . Diabetes mellitus   . Frequent UTI   . GERD (gastroesophageal reflux disease)   . Heart disease   . Heart murmur   . 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 Hx of colonic polyps   . Hyperhidrosis    especially with hot flashes    . Hyperlipidemia   . Lipoma    R axilla   . MRSA cellulitis    surgical wound infection  . Osteoporosis   . Pyelocystitis    as a child   . Thyroid disease   . Urinary incontinence    Past Surgical History:  Procedure Laterality Date  . ABDOMINAL HYSTERECTOMY  1970  . [place], 2005   pulmonary stenosis with valve replacement, Dr. Evelina Dun  . PARTIAL HYSTERECTOMY  1970  . PULMONARY VEIN STENOSIS REPAIR    . VALVE REPLACEMENT     Family History  Problem Relation Age of Onset  . Coarctation of the aorta Sister   . Diabetes Mother   . COPD Mother   . Obesity Mother   . Depression Mother   . Alcohol abuse Father   . Cirrhosis Father   . Diabetes Sister   . Down syndrome Brother   . Down syndrome Daughter   . Lupus Daughter   . Ulcerative colitis Daughter    Social History  Substance Use Topics  . Smoking status: Never Smoker  . Smokeless tobacco: Never Used  .  Alcohol use Yes     Comment: occasional wine       Review of Systems Per HPI    Objective:   Physical Exam  Constitutional: She is oriented to person, place, and time. She appears well-developed and well-nourished. No distress.  Cardiovascular: Normal rate and regular rhythm.  Murmur heard. Musculoskeletal: She exhibits no edema.  Neurological: She is alert and oriented to person, place, and time.  Skin: Skin is warm and dry. She is not diaphoretic.  Psychiatric: She has a normal mood and affect. Her behavior is normal. Judgment and thought content normal.  Vitals reviewed.     Pulse 100   Temp 97.8 F (36.6 C) (Oral)   Wt 145 lb 12 oz (66.1 kg)   SpO2 97%   BMI 27.54 kg/m  BP Readings from Last 3 Encounters:  05/11/17 124/82  10/31/16 98/74  10/18/16 131/68  Office Spirometry Results:    EKG- Sinus  Tachycardia rate 106 -Incomplete left bundle branch block and right axis.   -Poor R-wave progression -may be secondary to conduction defect  consider old anterior infarct.   -  Nonspecific T-abnormality.  -  unchanged from 4/18 EKG    Spirometry (graph located in media tab) FVC 55% predicted FEV1 52% predicted FEV!/FVC 94% predicted Interpretation- moderately severe restriction  Assessment & Plan:  1. Tachycardia - EKG 12-Lead - CBC - Comprehensive metabolic panel - TSH - follow up with Dr. Derrel Nip in 2 weeks, sooner if increased symptoms  2. DOE (dyspnea on exertion) - Spirometry with Graph - fluticasone (FLOVENT HFA) 110 MCG/ACT inhaler; Inhale 2 puffs into the lungs 2 (two) times daily.  Dispense: 1 Inhaler; Refill: 12 - CBC - Comprehensive metabolic panel - chronic according to patient, ? Worse with increased HR - Follow up with Dr. Derrel Nip in 2 weeks, sooner if worsening  3. Restrictive airway disease - fluticasone (FLOVENT HFA) 110 MCG/ACT inhaler; Inhale 2 puffs into the lungs 2 (two) times daily.  Dispense: 1 Inhaler; Refill: [place],  FNP-BC  [facility], [place] [facility]  05/13/2017 6:32 PM

## 2017-05-11 ENCOUNTER — Ambulatory Visit (INDEPENDENT_AMBULATORY_CARE_PROVIDER_SITE_OTHER): Payer: Medicare Other | Admitting: Internal Medicine

## 2017-05-11 ENCOUNTER — Encounter: Payer: Self-pay | Admitting: Internal Medicine

## 2017-05-11 VITALS — BP 124/82 | HR 106 | Wt 146.0 lb

## 2017-05-11 DIAGNOSIS — E039 Hypothyroidism, unspecified: Secondary | ICD-10-CM | POA: Diagnosis not present

## 2017-05-11 DIAGNOSIS — E1165 Type 2 diabetes mellitus with hyperglycemia: Secondary | ICD-10-CM | POA: Diagnosis not present

## 2017-05-11 LAB — HEMOGLOBIN A1C: Hgb A1c MFr Bld: 7 % — ABNORMAL HIGH (ref 4.6–6.5)

## 2017-05-11 NOTE — Patient Instructions (Signed)
Please continue: - Metformin 1000 mg 2x a day - Glipizide ER 5 mg in am, before b'fast If you have a larger meal or you have dessert, take 1/2-1 Glipizide tablet before that meal.  Please stop at the lab.  Please return in 3-4 months with your sugar log.

## 2017-05-11 NOTE — Progress Notes (Signed)
Patient ID: Haley Young, female   DOB: [birth year], 66 y.o.   MRN: [id number]  HPI: Haley Young is a 66 y.o.-year-old female, returning for f/u for DM2, dx 2010, non-insulin-dependent, uncontrolled, with complications (CKD). Last visit 7.5 mo ago.  She had an episode of SOB yesterday + tachycardia >> 140. Saw Primary Care >> EKG Unchanged, labs checked: potassium higher and GFR lower. Given an inhaler and she needs to go back in 2 weeks for a recheck.  Last hemoglobin A1c: 09/26/2016: HbA1c calculated from fructosamine is slightly higher, at 7.0%. 04/26/2016: HbA1c calculated from fructosamine is 6.38%. 01/25/2016: HbA1c calculated from fructosamine is 6.35% 08/21/2015: HbA1c calculated from fructosamine is 6.88%  Lab Results  Component Value Date   HGBA1C 9.1 08/21/2015   HGBA1C 7.5 (H) 03/12/2015   HGBA1C 7.9 (H) 09/09/2014  12/18/2014: HbA1c calculated from fructosamine is 6.88% 09/10/2014: HbA1c calculated from fructosamine is 6.9% 03/19/2014:  HbA1c calculated from fructosamine is 6.57%  Pt is on a regimen of: - Metformin 1000 mg 2x a day - Glipizide ER 5 mg daily in am (tried 2 tabs >> Glu dropping too low) If you have a larger meal or you have dessert, take 1 of the Glipizide before that meal >> Did not use She was on Januvia and Alogliptin >> too expensive She was on Jardiance 25 mg daily >> started 01/2016 >> however, potassium increased >> stopped (but also costly) We started Glipizide as we had to stop Invokana >> tremors >> stopped She was on Invokana 300 mg daily >> stopped 2/2 high potassium She was on Invokana 300 mg daily - added 08/2013  - costs her 300$ >> at last visit I gave her a coupon for InvokaMet 2 tablets in am ([phone] mg) daily in am and continued Metformin 1000 mg with dinner. She tells she can get the 3 mo supply for 207%$ Tried insulin Lantus and Novolog - ~2011 Tried Cinnamon and vinegar.  Pt checks sugars 2x a day: - am:  114-176, 189 >> 99-136 >>  87-132, 144 - 2h after b'fast: n/c >> 131-141 >> n/c  - before lunch: 126-172 >> 86-148, 152 >> 56, 70 (10 mg Glipizide),123-144  - 2h after lunch: 132-171 >> 131-164, 178 >> 116-166. 172 - before dinner: 157, 161 >> 133-139 >> 142 - 2h after dinner: 139-173, 213 (dessert) >> 126-178, 182 >> 126-167, 174 - bedtime: 146-174, 181 >> 124-177, 181, 197 >> 124-161, 174 - nighttime:  129-160 >> n/c  Pt's meals are: - Breakfast: banana + PB or cereals; coffee + sweetener. - Lunch: yoghurt + peaches, ham sandwich; soup - Dinner: baked chicken; peas; salad She exercises 2-3x a week by walking - 30 min.   - no CKD, last BUN/creatinine:  Lab Results  Component Value Date   BUN 26 (H) 05/10/2017   CREATININE 1.36 (H) 05/10/2017   Lab Results  Component Value Date   MICRALBCREAT 5.3 04/14/2016   MICRALBCREAT 1.3 03/12/2015   MICRALBCREAT 1.5 09/09/2014   MICRALBCREAT 2.0 09/13/2013   MICRALBCREAT 1.2 06/28/2012   MICRALBCREAT 2.7 03/29/2012   05/10/2017: GFR 41 Lab Results  Component Value Date   GFRNONAA 46 (L) 10/18/2016   GFRNONAA 56 (L) 04/26/2016   GFRNONAA 60 01/09/2015   GFRNONAA >60 07/27/2011   GFRNONAA >60 07/26/2011   GFRNONAA >60 07/25/2011   GFRNONAA 91 08/13/2008   - last set of lipids: Lab Results  Component Value Date   CHOL 239 (H) 04/14/2016   HDL 57.90 04/14/2016  LDLCALC 149 (H) 04/14/2016   LDLDIRECT 165.0 04/14/2016   TRIG 162.0 (H) 04/14/2016   CHOLHDL 4 04/14/2016  On Lipitor. - last eye exam was in 02/2017 >> No DR - no numbness and tingling in her feet.  She sees cardiology at [facility] (Dr. Leonides Schanz). She had a catheterization >> no blockages. 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  She also has hypothyroidism. Last TSH was high yesterday: Lab Results  Component Value Date   TSH 6.36 (H) 05/10/2017   TSH 1.98 04/14/2016   TSH 0.37 01/25/2016   TSH 2.38 08/21/2015   TSH 6.32 (H)  03/12/2015   TSH 10.17 (H) 12/16/2014   TSH 7.08 (H) 09/09/2014   FREET4 1.71 (H) 01/25/2016   FREET4 1.19 08/21/2015   FREET4 0.86 03/12/2015   FREET4 0.71 12/16/2014   FREET4 0.75 03/05/2010   FREET4 0.7 12/08/2009   She is on LT4 125 mcg daily: - may have missed few doses - in am - fasting - at least 30 min from b'fast - no Ca, Fe, MVI, PPIs - not on Biotin  She also has a h/o a low B12 vitamin.  ROS: Constitutional: + weight gain/no weight loss, no fatigue, no subjective hyperthermia, no subjective hypothermia Eyes: no blurry vision, no xerophthalmia ENT: no sore throat, no nodules palpated in throat, no dysphagia, no odynophagia, no hoarseness Cardiovascular: no CP/+ SOB/no palpitations/no leg swelling Respiratory: no cough+ SOB/no wheezing Gastrointestinal: no N/no V/no D/no C/no acid reflux Musculoskeletal: no muscle aches/no joint aches Skin: no rashes, no hair loss Neurological: no tremors/no numbness/no tingling/no dizziness  I reviewed pt's medications, allergies, PMH, social hx, family hx, and changes were documented in the history of present illness. Otherwise, unchanged from my initial visit note.  PE: BP 124/82 (BP Location: Left Arm, Patient Position: Sitting)   Pulse (!) 106   Wt 146 lb (66.2 kg)   SpO2 97%   BMI 27.59 kg/m  Body mass index is 27.59 kg/m.  Wt Readings from Last 3 Encounters:  05/11/17 146 lb (66.2 kg)  05/10/17 145 lb 12 oz (66.1 kg)  10/31/16 138 lb 9.6 oz (62.9 kg)   Constitutional: overweight, in NAD Eyes: PERRLA, EOMI, no exophthalmos ENT: moist mucous membranes, no thyromegaly, no cervical lymphadenopathy Cardiovascular: RRR, No RG, + 3/6 SEM Respiratory: CTA B Gastrointestinal: abdomen soft, NT, ND, BS+ Musculoskeletal: no deformities, strength intact in all 4 Skin: moist, warm, no rashes Neurological: no tremor with outstretched hands, DTR normal in all 4  ASSESSMENT: 1. DM2, non-insulin-dependent, uncontrolled,  without complications  She has been on Invokana 300 in the past, however she had a potassium of 5.8 >> Invokana was stopped. We have tried glipizide after stopping Invokana, however, she developed lows and we had to stop it.   2.  Hypothyroidism   PLAN:  1. Patient with fairly well controlled diabetes, on oral DM medications with Metformin and Glipizide but had to come off DPP4 inh b/c price. Her sugars are at or close to goal on this regimen, but she had some lows when she was taking 10 mg Glipizide in am >> advised to stay with 5 mg. - reviewed her kidney tests >> GFR decreasing. I think for now we can stay on the same Metformin dose,, but advised her to stay very well hydrated. May need to decrease the dose at next visit if GFR does not improve - I advised her  to: Patient Instructions  Please continue: - Metformin 1000 mg 2x a day - Glipizide ER 5 mg in am, before b'fast If you have a larger meal or you have dessert, take 1/2-1 Glipizide tablet before that meal.  Please stop at the lab.  Please return in 3-4 months with your sugar log.   - will check a fructosamine today - continue checking sugars at different times of the day - check 1x a day, rotating checks - advised for yearly eye exams >> she is UTD - Return to clinic in 3 mo with sugar log   2.  Hypothyroidism  - latest thyroid labs reviewed with pt >> high, yesterday, but she admits to missing few doses - she continues on LT4 125 mcg daily >> continue on this >> will recheck TFTs at next visit - pt feels good on this dose. - we discussed about taking the thyroid hormone with water, >30 minutes before breakfast, separated by >4 hours from acid reflux medications, calcium, iron, multivitamins. Pt. is taking it correctly.   Office Visit on 05/11/2017  Component Date Value Ref Range Status  . Hgb A1c MFr Bld 05/11/2017 7.0* 4.6 - 6.5 % Final   Glycemic Control Guidelines for People with Diabetes:Non Diabetic:  <6%Goal of  Therapy: <7%Additional Action Suggested:  >8%   . Fructosamine 05/11/2017 282* 190 - 270 umol/L Final   05/11/2017: HbA1c calculated from fructosamine is 6.4%.  Haley Kingdom, MD PhD [facility]

## 2017-05-12 ENCOUNTER — Encounter: Payer: Self-pay | Admitting: Internal Medicine

## 2017-05-12 NOTE — Telephone Encounter (Signed)
Please schedule patient for 4:30 on 05/17/17. Patient is aware of date and time and Dr. Derrel Nip has approved appt time. Thank you

## 2017-05-12 NOTE — Telephone Encounter (Signed)
Patient has been scheduled

## 2017-05-12 NOTE — Telephone Encounter (Signed)
Yes, ok to use Nov 4 at 4:30 . Her labs and symtoms suggest dehdyration so she needs to increase her fluid inake until her urine is  light yellow

## 2017-05-12 NOTE — Telephone Encounter (Signed)
Copied from [place] [id number]. Topic: General - Other >> May 12, 2017  8:29 AM Vernona Rieger wrote: Reason for CRM:  Wants to talk to a nurse at the office about her kidney function. Went to the ENDOCRINOLOGIST yesterday, wants TULLO to know what is going on & has questions for his nurse. Please give her a call back asap  Patient wanted you to be aware that she is having SOB, elevated heart rate, just not feeling well. She says she is sending in a more detailed MyChart message. She has been having these symptoms for awhile. She was seen on 10/31 by D. Gessner at [facility] and had labs done. Kidney function is decreased. NP at [facility] ([facility]) recommended that patient follow up with you within 2 weeks. She seen endocrinology yesterday. Can we use 05/16/17 at 4:30?

## 2017-05-13 ENCOUNTER — Other Ambulatory Visit: Payer: Self-pay | Admitting: Internal Medicine

## 2017-05-13 DIAGNOSIS — R944 Abnormal results of kidney function studies: Secondary | ICD-10-CM

## 2017-05-15 LAB — FRUCTOSAMINE: Fructosamine: 282 umol/L — ABNORMAL HIGH (ref 190–270)

## 2017-05-17 ENCOUNTER — Encounter: Payer: Self-pay | Admitting: Internal Medicine

## 2017-05-17 ENCOUNTER — Ambulatory Visit (INDEPENDENT_AMBULATORY_CARE_PROVIDER_SITE_OTHER): Payer: Medicare Other | Admitting: Internal Medicine

## 2017-05-17 VITALS — BP 104/64 | HR 74 | Temp 97.9°F | Resp 16 | Ht 61.0 in | Wt 149.8 lb

## 2017-05-17 DIAGNOSIS — E1165 Type 2 diabetes mellitus with hyperglycemia: Secondary | ICD-10-CM

## 2017-05-17 DIAGNOSIS — N183 Chronic kidney disease, stage 3 unspecified: Secondary | ICD-10-CM

## 2017-05-17 DIAGNOSIS — E875 Hyperkalemia: Secondary | ICD-10-CM | POA: Diagnosis not present

## 2017-05-17 DIAGNOSIS — R944 Abnormal results of kidney function studies: Secondary | ICD-10-CM | POA: Diagnosis not present

## 2017-05-17 DIAGNOSIS — R350 Frequency of micturition: Secondary | ICD-10-CM

## 2017-05-17 MED ORDER — LORAZEPAM 1 MG PO TABS: 1.0000 mg | ORAL_TABLET | Freq: Two times a day (BID) | ORAL | 3 refills | Status: DC | PRN

## 2017-05-17 NOTE — Patient Instructions (Signed)
I have ordered an ultrasound of your kidneys to evaluate their size and shape  We are repeating your potassium level today   I recommend staying away from high potassium foods   Hyperkalemia Hyperkalemia is when you have too much potassium in your blood. Potassium is normally removed (excreted) from your body by your kidneys. If there is too much potassium in your blood, it can affect how your heart works. Follow these instructions at home:  Take medicines only as told by your doctor.  Do not take any supplements, natural products, herbs, or vitamins unless your doctor says it is okay.  Limit your alcohol intake as told by your doctor.  Stop illegal drug use. If you need help quitting, ask your doctor.  Keep all follow-up visits as told by your doctor. This is important.  If you have kidney disease, you may need to follow a low potassium diet. A food specialist (dietitian) can help you. Contact a doctor if:  Your heartbeat is not regular or very slow.  You feel dizzy (light-headed).  You feel weak.  You feel sick to your stomach (nauseous).  You have tingling in your hands or feet.  You cannot feel your hands or feet. Get help right away if:  You are short of breath.  You have chest pain.  You pass out (faint).  You cannot move your muscles. This information is not intended to replace advice given to you by your health care provider. Make sure you discuss any questions you have with your health care provider. Document Released: 06/27/2005 Document Revised: 12/03/2015 Document Reviewed: 10/02/2013 Elsevier Interactive Patient Education  2018 [employer].

## 2017-05-17 NOTE — Progress Notes (Signed)
Subjective:  Patient ID: Haley Young, female    DOB: 01-Jul-1951  Age: 66 y.o. MRN: [id number]  CC: The primary encounter diagnosis was Hyperkalemia. Diagnoses of Type 2 diabetes mellitus with hyperglycemia, without long-term current use of insulin ([place]), Chronic kidney disease, stage 3, mod decreased GFR ([place]), Urinary frequency, and Decreased GFR were also pertinent to this visit.  HPI Haley Young presents for concerns about change in GFR noted  On Oct 31st while at Dr Chrissie Noa .patient was not fasting when labs were done, and typically drinks 3 to 5 bottles of water daily .   Very  little caffeine ,  No NSAIDS.   Reviewed labs and medications with patient.  Mild recurrent Hyperkalemia and persistent mild hyponatremia which predates the initiation of lexapro (replaced prozac) .  She has a family history CKD in her brother  Who developed ESKD late in life.   Outpatient Medications Prior to Visit  Medication Sig Dispense Refill  . atorvastatin (LIPITOR) 20 MG tablet TAKE ONE TABLET BY MOUTH ONCE DAILY 90 tablet 1  . escitalopram (LEXAPRO) 5 MG tablet TAKE 1 TABLET BY MOUTH ONCE DAILY 90 tablet 0  . fluticasone (FLOVENT HFA) 110 MCG/ACT inhaler Inhale 2 puffs into the lungs 2 (two) times daily. 1 Inhaler 12  . glipiZIDE (GLUCOTROL XL) 5 MG 24 hr tablet Please take 2 tablets in the morning, 1 tablet in the evening. 270 tablet 3  . glucose blood (ONE TOUCH ULTRA TEST) test strip USE ONE STRIP TO CHECK GLUCOSE THREE TIMES DAILY AS DIRECTED E11.65 200 each 2  . levothyroxine (SYNTHROID, LEVOTHROID) 125 MCG tablet TAKE ONE TABLET BY MOUTH ONCE DAILY 90 tablet 3  . metFORMIN (GLUCOPHAGE) 1000 MG tablet TAKE 1 TABLET BY MOUTH TWICE DAILY WITH MEALS 180 tablet 0  . traZODone (DESYREL) 150 MG tablet TAKE 1 TABLET BY MOUTH AT BEDTIME 90 tablet 1  . LORazepam (ATIVAN) 1 MG tablet TAKE 1 TABLET BY MOUTH TWICE DAILY AS NEEDED FOR ANXIETY 60 tablet 3   No facility-administered medications prior to visit.      Review of Systems;  Patient denies headache, fevers, malaise, unintentional weight loss, skin rash, eye pain, sinus congestion and sinus pain, sore throat, dysphagia,  hemoptysis , cough, dyspnea, wheezing, chest pain, palpitations, orthopnea, edema, abdominal pain, nausea, melena, diarrhea, constipation, flank pain, dysuria, hematuria, urinary  Frequency, nocturia, numbness, tingling, seizures,  Focal weakness, Loss of consciousness,  Tremor, insomnia, depression, anxiety, and suicidal ideation.      Objective:  BP 104/64 (BP Location: Left Arm, Patient Position: Sitting, Cuff Size: Normal)   Pulse 74   Temp 97.9 F (36.6 C) (Oral)   Resp 16   Ht 5\' 1"  (1.549 m)   Wt 149 lb 12.8 oz (67.9 kg)   SpO2 98%   BMI 28.30 kg/m   BP Readings from Last 3 Encounters:  05/17/17 104/64  05/11/17 124/82  10/31/16 98/74    Wt Readings from Last 3 Encounters:  05/17/17 149 lb 12.8 oz (67.9 kg)  05/11/17 146 lb (66.2 kg)  05/10/17 145 lb 12 oz (66.1 kg)    General appearance: alert, cooperative and appears stated age Ears: normal TM's and external ear canals both ears Throat: lips, mucosa, and tongue normal; teeth and gums normal Neck: no adenopathy, no carotid bruit, supple, symmetrical, trachea midline and thyroid not enlarged, symmetric, no tenderness/mass/nodules Back: symmetric, no curvature. ROM normal. No CVA tenderness. Lungs: clear to auscultation bilaterally Heart: regular rate and rhythm,  S1, S2 normal, no murmur, click, rub or gallop Abdomen: soft, non-tender; bowel sounds normal; no masses,  no organomegaly Pulses: 2+ and symmetric Skin: Skin color, texture, turgor normal. No rashes or lesions Lymph nodes: Cervical, supraclavicular, and axillary nodes normal.  Lab Results  Component Value Date   HGBA1C 7.0 (H) 05/11/2017   HGBA1C 9.1 08/21/2015   HGBA1C 7.5 (H) 03/12/2015    Lab Results  Component Value Date   CREATININE 1.02 05/17/2017   CREATININE 1.36 (H)  05/10/2017   CREATININE 1.00 11/07/2016    Lab Results  Component Value Date   WBC 10.3 05/10/2017   HGB 15.1 (H) 05/10/2017   HCT 45.9 05/10/2017   PLT 304.0 05/10/2017   GLUCOSE 135 (H) 05/17/2017   CHOL 239 (H) 04/14/2016   TRIG 162.0 (H) 04/14/2016   HDL 57.90 04/14/2016   LDLDIRECT 165.0 04/14/2016   LDLCALC 149 (H) 04/14/2016   ALT 17 05/10/2017   AST 25 05/10/2017   NA 134 (L) 05/17/2017   K 5.0 05/17/2017   CL 101 05/17/2017   CREATININE 1.02 05/17/2017   BUN 20 05/17/2017   CO2 26 05/17/2017   TSH 6.36 (H) 05/10/2017   HGBA1C 7.0 (H) 05/11/2017   MICROALBUR 1.2 05/17/2017     Assessment & Plan:   Problem List Items Addressed This Visit    Decreased GFR    Improved with hydration .  UTI suggested by current UA.  Renal ultrasound ordered      Hyperkalemia - Primary    Mild, recurrent.  Resolved on repeat bmet .  Low potassium diet handout given   Lab Results  Component Value Date   NA 134 (L) 05/17/2017   K 5.0 05/17/2017   CL 101 05/17/2017   CO2 26 05/17/2017         Relevant Orders   Basic metabolic panel (Completed)   Type 2 diabetes mellitus with hyperglycemia, without long-term current use of insulin (HCC)   Relevant Orders   Microalbumin / creatinine urine ratio (Completed)    Other Visit Diagnoses    Chronic kidney disease, stage 3, mod decreased GFR (HCC)       Relevant Orders   US Renal   Urinalysis, Routine w reflex microscopic (Completed)   Urinary frequency       Relevant Orders   Urine Culture    A total of 25 minutes of face to face time was spent with patient more than half of which was spent in counselling about the above mentioned conditions  and coordination of care  I have changed Zigmund Daniel S. Keeble's LORazepam. I am also having her maintain her glucose blood, glipiZIDE, atorvastatin, levothyroxine, escitalopram, metFORMIN, traZODone, and fluticasone.  Meds ordered this encounter  Medications  . LORazepam (ATIVAN) 1 MG tablet     Sig: Take 1 tablet (1 mg total) 2 (two) times daily as needed by mouth. for anxiety    Dispense:  60 tablet    Refill:  3    Medications Discontinued During This Encounter  Medication Reason  . LORazepam (ATIVAN) 1 MG tablet Reorder    Follow-up: No Follow-up on file.   [name], MD

## 2017-05-18 ENCOUNTER — Encounter: Payer: Self-pay | Admitting: Internal Medicine

## 2017-05-18 DIAGNOSIS — N183 Chronic kidney disease, stage 3 unspecified: Secondary | ICD-10-CM | POA: Insufficient documentation

## 2017-05-18 DIAGNOSIS — R350 Frequency of micturition: Secondary | ICD-10-CM | POA: Diagnosis not present

## 2017-05-18 DIAGNOSIS — R944 Abnormal results of kidney function studies: Secondary | ICD-10-CM | POA: Insufficient documentation

## 2017-05-18 DIAGNOSIS — E1122 Type 2 diabetes mellitus with diabetic chronic kidney disease: Secondary | ICD-10-CM | POA: Insufficient documentation

## 2017-05-18 DIAGNOSIS — Z794 Long term (current) use of insulin: Secondary | ICD-10-CM | POA: Insufficient documentation

## 2017-05-18 LAB — BASIC METABOLIC PANEL
BUN: 20 mg/dL (ref 6–23)
CALCIUM: 9.8 mg/dL (ref 8.4–10.5)
CHLORIDE: 101 meq/L (ref 96–112)
CO2: 26 mEq/L (ref 19–32)
CREATININE: 1.02 mg/dL (ref 0.40–1.20)
GFR: 57.14 mL/min — ABNORMAL LOW (ref >60.00)
Glucose, Bld: 135 mg/dL — ABNORMAL HIGH (ref 70–99)
Potassium: 5 mEq/L (ref 3.5–5.1)
Sodium: 134 mEq/L — ABNORMAL LOW (ref 135–145)

## 2017-05-18 LAB — URINALYSIS, ROUTINE W REFLEX MICROSCOPIC
Bilirubin Urine: NEGATIVE
HGB URINE DIPSTICK: NEGATIVE
KETONES UR: NEGATIVE
Nitrite: NEGATIVE
RBC / HPF: NONE SEEN (ref 0–?)
Specific Gravity, Urine: 1.015 (ref 1.000–1.030)
TOTAL PROTEIN, URINE-UPE24: NEGATIVE
URINE GLUCOSE: NEGATIVE
UROBILINOGEN UA: 0.2 (ref 0.0–1.0)
pH: 6 (ref 5.0–8.0)

## 2017-05-18 LAB — MICROALBUMIN / CREATININE URINE RATIO
Creatinine,U: 54.5 mg/dL
MICROALB UR: 1.2 mg/dL (ref 0.0–1.9)
Microalb Creat Ratio: 2.3 mg/g (ref 0.0–30.0)

## 2017-05-18 NOTE — Assessment & Plan Note (Signed)
Improved with hydration .  UTI suggested by current UA.  Renal ultrasound ordered

## 2017-05-18 NOTE — Assessment & Plan Note (Signed)
Mild, recurrent.  Resolved on repeat bmet .  Low potassium diet handout given   Lab Results  Component Value Date   NA 134 (L) 05/17/2017   K 5.0 05/17/2017   CL 101 05/17/2017   CO2 26 05/17/2017

## 2017-05-19 ENCOUNTER — Encounter: Payer: Self-pay | Admitting: Internal Medicine

## 2017-05-20 ENCOUNTER — Encounter: Payer: Self-pay | Admitting: Family Medicine

## 2017-05-21 ENCOUNTER — Encounter: Payer: Self-pay | Admitting: Internal Medicine

## 2017-05-21 ENCOUNTER — Other Ambulatory Visit: Payer: Self-pay | Admitting: Internal Medicine

## 2017-05-21 LAB — URINE CULTURE
MICRO NUMBER: 81258645
SPECIMEN QUALITY:: ADEQUATE

## 2017-05-21 MED ORDER — CIPROFLOXACIN HCL 250 MG PO TABS: 250.0000 mg | ORAL_TABLET | Freq: Two times a day (BID) | ORAL | 0 refills | Status: DC

## 2017-05-25 ENCOUNTER — Ambulatory Visit: Payer: Medicare Other

## 2017-06-14 ENCOUNTER — Ambulatory Visit: Payer: Medicare Other | Admitting: Internal Medicine

## 2017-07-10 ENCOUNTER — Ambulatory Visit (INDEPENDENT_AMBULATORY_CARE_PROVIDER_SITE_OTHER): Payer: Medicare Other

## 2017-07-10 VITALS — BP 102/64 | HR 68 | Temp 98.3°F | Resp 14 | Ht 61.0 in | Wt 149.8 lb

## 2017-07-10 DIAGNOSIS — Z1159 Encounter for screening for other viral diseases: Secondary | ICD-10-CM | POA: Diagnosis not present

## 2017-07-10 DIAGNOSIS — Z1231 Encounter for screening mammogram for malignant neoplasm of breast: Secondary | ICD-10-CM | POA: Diagnosis not present

## 2017-07-10 DIAGNOSIS — Z23 Encounter for immunization: Secondary | ICD-10-CM

## 2017-07-10 DIAGNOSIS — Z1239 Encounter for other screening for malignant neoplasm of breast: Secondary | ICD-10-CM

## 2017-07-10 DIAGNOSIS — Z0000 Encounter for general adult medical examination without abnormal findings: Secondary | ICD-10-CM

## 2017-07-10 NOTE — Progress Notes (Signed)
Subjective:   Haley Young is a 66 y.o. female who presents for Medicare Annual (Subsequent) preventive examination.  Review of Systems:  No ROS.  Medicare Wellness Visit. Additional risk factors are reflected in the social history.  Cardiac Risk Factors include: advanced age (>23men, >33 women);diabetes mellitus     Objective:     Vitals: BP 102/64 (BP Location: Left Arm, Patient Position: Sitting, Cuff Size: Normal)   Pulse 68   Temp 98.3 F (36.8 C) (Oral)   Resp 14   Ht 5\' 1"  (1.549 m)   Wt 149 lb 12.8 oz (67.9 kg)   SpO2 97%   BMI 28.30 kg/m   Body mass index is 28.3 kg/m.  Advanced Directives 07/10/2017 10/18/2016 07/08/2016  Does Patient Have a Medical Advance Directive? No No No  Would patient like information on creating a medical advance directive? No - Patient declined - Yes (MAU/Ambulatory/Procedural Areas - Information given)    Tobacco Social History   Tobacco Use  Smoking Status Never Smoker  Smokeless Tobacco Never Used     Counseling given: Not Answered   Clinical Intake:  Pre-visit preparation completed: Yes  Pain : No/denies pain     Nutritional Status: BMI 25 -29 Overweight Diabetes: Yes(Followed by [facility])  How often do you need to have someone help you when you read instructions, pamphlets, or other written materials from your doctor or pharmacy?: 1 - Never  Interpreter Needed?: No     Past Medical History:  Diagnosis Date  . Asthma   . Chicken pox   . Depression   . Diabetes mellitus   . Frequent UTI   . GERD (gastroesophageal reflux disease)   . Heart disease   . Heart murmur   . 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 Hx of colonic polyps   . Hyperhidrosis    especially with hot flashes    . Hyperlipidemia   . Lipoma    R axilla   . MRSA cellulitis    surgical wound infection  . Osteoporosis   . Pyelocystitis    as a child   . Thyroid disease   . Urinary incontinence     Past Surgical History:  Procedure Laterality Date  . ABDOMINAL HYSTERECTOMY  1970  . [place], 2005   pulmonary stenosis with valve replacement, Dr. Evelina Dun  . PARTIAL HYSTERECTOMY  1970  . PULMONARY VEIN STENOSIS REPAIR    . VALVE REPLACEMENT     Family History  Problem Relation Age of Onset  . Coarctation of the aorta Sister   . Diabetes Mother   . COPD Mother   . Obesity Mother   . Depression Mother   . Pancreatic cancer Mother   . Alcohol abuse Father   . Cirrhosis Father   . Down syndrome Brother   . Pneumonia Brother   . Down syndrome Daughter   . Lupus Daughter   . Ulcerative colitis Daughter    Social History   Socioeconomic History  . Marital status: Married    Spouse name: None  . Number of children: 2  . Years of education: None  . Highest education level: None  Social Needs  . Financial resource strain: None  . Food insecurity - worry: Never true  . Food insecurity - inability: Never true  . Transportation needs - medical: No  . Transportation needs - non-medical: No  Occupational History  . Occupation: Retired  Employer: RETIRED  . Occupation: Sales   Tobacco Use  . Smoking status: Never Smoker  . Smokeless tobacco: Never Used  Substance and Sexual Activity  . Alcohol use: Yes    Comment: occasional wine   . Drug use: No  . Sexual activity: Yes  Other Topics Concern  . None  Social History Narrative   Divorced x 2      Sister is pt here- Surveyor, minerals      [place]ress for 10 year old mother      11 for daughter with down's syndrome      Adopted sone with Asberger's syndrome      G1P1      Cares for 2 disabled children     Outpatient Encounter Medications as of 07/10/2017  Medication Sig  . atorvastatin (LIPITOR) 20 MG tablet TAKE ONE TABLET BY MOUTH ONCE DAILY  . escitalopram (LEXAPRO) 5 MG tablet TAKE 1 TABLET BY MOUTH ONCE DAILY  . fluticasone (FLOVENT HFA) 110 MCG/ACT inhaler Inhale 2 puffs into the lungs 2 (two)  times daily.  Marland Kitchen glipiZIDE (GLUCOTROL XL) 5 MG 24 hr tablet Please take 2 tablets in the morning, 1 tablet in the evening.  Marland Kitchen glucose blood (ONE TOUCH ULTRA TEST) test strip USE ONE STRIP TO CHECK GLUCOSE THREE TIMES DAILY AS DIRECTED E11.65  . levothyroxine (SYNTHROID, LEVOTHROID) 125 MCG tablet TAKE ONE TABLET BY MOUTH ONCE DAILY  . LORazepam (ATIVAN) 1 MG tablet Take 1 tablet (1 mg total) 2 (two) times daily as needed by mouth. for anxiety  . metFORMIN (GLUCOPHAGE) 1000 MG tablet TAKE 1 TABLET BY MOUTH TWICE DAILY WITH MEALS  . traZODone (DESYREL) 150 MG tablet TAKE 1 TABLET BY MOUTH AT BEDTIME  . [DISCONTINUED] ciprofloxacin (CIPRO) 250 MG tablet Take 1 tablet (250 mg total) 2 (two) times daily by mouth.   No facility-administered encounter medications on file as of 07/10/2017.     Activities of Daily Living In your present state of health, do you have any difficulty performing the following activities: 07/10/2017  Hearing? N  Vision? N  Difficulty concentrating or making decisions? Y  Comment States she has difficulty remembering names   Walking or climbing stairs? N  Dressing or bathing? N  Doing errands, shopping? N  Preparing Food and eating ? N  Using the Toilet? N  In the past six months, have you accidently leaked urine? Y  Comment Managed with a daily pad  Do you have problems with loss of bowel control? N  Managing your Medications? N  Managing your Finances? N  Housekeeping or managing your Housekeeping? N  Some recent data might be hidden    Patient Care Team: [name], MD as PCP - General (Internal Medicine)    Assessment:   This is a routine wellness examination for [employer]. The goal of the wellness visit is to assist the patient how to close the gaps in care and create a preventative care plan for the patient.   The roster of all physicians providing medical care to patient is listed in the Snapshot section of the chart.  Osteoporosis risk reviewed.     Safety issues reviewed; Smoke and carbon monoxide detectors in the home. No firearms in the home.  Wears seatbelts when driving or riding with others. Patient does wear sunscreen or protective clothing when in direct sunlight. No violence in the home.  Patient is alert, normal appearance, oriented to person/place/and time. Correctly identified the president of the [place], recall  of 3/3 words, and performing simple calculations. Displays appropriate judgement and can read correct time from watch face.   No new identified risk were noted.  No failures at ADL's or IADL's.    BMI- discussed the importance of a healthy diet, water intake and the benefits of aerobic exercise. Educational material provided.   24 hour diet recall: Breakfast: instant breakfast with milk Lunch: sesame chicken  Dinner: 1slice of pizza   Daily fluid intake: 1 cups of caffeine, 8-10 cups of water  Dental- Dentures.    Eye- Visual acuity not assessed per patient preference since they have regular follow up with the ophthalmologist.  Wears corrective lenses.  Sleep patterns- Sleeps 8 hours at night.  Wakes feeling rested. Taking medication as prescribed.    High dose influenza administered L deltoid, tolerated well.   Educational material provided.  Mammogram ordered; follow as directed.  Educational material provided.  TDAP vaccine deferred per patient preference.  Follow up with insurance.  Educational material provided.  Hepatitis C Screening completed.  Patient Concerns: None at this time. Follow up with PCP as needed.  Exercise Activities and Dietary recommendations Current Exercise Habits: The patient does not participate in regular exercise at present  Goals    . DIET - INCREASE LEAN PROTEINS     Low carb foods    . Increase physical activity     Walk for exercise 5 days weekly, 30-45 minutes Join the gym/check with Silver sneaker program       Fall Risk Fall Risk  07/10/2017 07/08/2016  03/07/2016 08/08/2014  Falls in the past year? No No No Yes  Comment - - Emmi Telephone Survey: data to providers prior to load -  Number falls in past yr: - - - 2 or more  Risk for fall due to : - - - Impaired balance/gait   Depression Screen PHQ 2/9 Scores 07/10/2017 07/08/2016 05/29/2015 08/08/2014  PHQ - 2 Score 0 0 4 2  PHQ- 9 Score - - 14 8     Cognitive Function MMSE - Mini Mental State Exam 07/10/2017 07/08/2016  Orientation to time 5 5  Orientation to Place 5 5  Registration 3 3  Attention/ Calculation 5 5  Recall 3 3  Language- name 2 objects 2 2  Language- repeat 1 1  Language- follow 3 step command 3 3  Language- read & follow direction 1 1  Write a sentence 1 1  Copy design 1 1  Total score 30 30        Immunization History  Administered Date(s) Administered  . H1N1 08/13/2008  . Influenza Split 07/28/2011, 06/28/2012  . Influenza, High Dose Seasonal PF 04/14/2016, 07/10/2017  . Influenza,inj,Quad PF,6+ Mos 07/25/2013, 08/21/2015  . Influenza-Unspecified 07/22/2015  . Pneumococcal Conjugate-13 09/13/2013  . Pneumococcal Polysaccharide-23 07/30/2011  . Zoster 09/13/2013    Screening Tests Health Maintenance  Topic Date Due  . Hepatitis C Screening  07-31-1950  . TETANUS/TDAP  07/25/1967  . FOOT EXAM  09/14/2014  . PNA vac Low Risk Adult (2 of 2 - PPSV23) 07/29/2016  . MAMMOGRAM  10/22/2016  . HEMOGLOBIN A1C  11/08/2017  . OPHTHALMOLOGY EXAM  02/28/2018  . URINE MICROALBUMIN  05/17/2018  . COLONOSCOPY  09/14/2022  . INFLUENZA VACCINE  Completed  . DEXA SCAN  Completed      Plan:   End of life planning; Advanced aging; Advanced directives discussed.  No HCPOA/Living Will.  Additional information declined at this time.  I have personally reviewed  and noted the following in the patient's chart:   . Medical and social history . Use of alcohol, tobacco or illicit drugs  . Current medications and supplements . Functional ability and  status . Nutritional status . Physical activity . Advanced directives . List of other physicians . Hospitalizations, surgeries, and ER visits in previous 12 months . Vitals . Screenings to include cognitive, depression, and falls . Referrals and appointments  In addition, I have reviewed and discussed with patient certain preventive protocols, quality metrics, and best practice recommendations. A written personalized care plan for preventive services as well as general preventive health recommendations were provided to patient.     OBrien-Blaney, Jewell Ryans L, LPN  09/31/1216    I have reviewed the above information and agree with above.   Deborra Medina, MD

## 2017-07-10 NOTE — Patient Instructions (Addendum)
Haley Young , Thank you for taking time to come for your Medicare Wellness Visit. I appreciate your ongoing commitment to your health goals. Please review the following plan we discussed and let me know if I can assist you in the future.   Follow up with Dr. Derrel Nip as needed.    Mammogram ordered, follow as directed.  Have a great day and Happy New Year!  These are the goals we discussed: Goals    . DIET - INCREASE LEAN PROTEINS     Low carb foods    . Increase physical activity     Walk for exercise 5 days weekly, 30-45 minutes Join the gym/check with Silver sneaker program       This is a list of the screening recommended for you and due dates:  Health Maintenance  Topic Date Due  .  Hepatitis C: One time screening is recommended by Center for Disease Control  (CDC) for  adults born from [birth year] through 1967.   May 13, 1951  . Tetanus Vaccine  07/25/1967  . Complete foot exam   09/14/2014  . Pneumonia vaccines (2 of 2 - PPSV23) 07/29/2016  . Mammogram  10/22/2016  . Hemoglobin A1C  11/08/2017  . Eye exam for diabetics  02/28/2018  . Urine Protein Check  05/17/2018  . Colon Cancer Screening  09/14/2022  . Flu Shot  Completed  . DEXA scan (bone density measurement)  Completed    Mammogram A mammogram is an X-ray of the breasts that is done to check for abnormal changes. This procedure can screen for and detect any changes that may suggest breast cancer. A mammogram can also identify other changes and variations in the breast, such as:  Inflammation of the breast tissue (mastitis).  An infected area that contains a collection of pus (abscess).  A fluid-filled sac (cyst).  Fibrocystic changes. This is when breast tissue becomes denser, which can make the tissue feel rope-like or uneven under the skin.  Tumors that are not cancerous (benign).  Tell a health care provider about:  Any allergies you have.  If you have breast implants.  If you have had previous breast  disease, biopsy, or surgery.  If you are breastfeeding.  Any possibility that you could be pregnant, if this applies.  If you are younger than age 48.  If you have a family history of breast cancer. What are the risks? Generally, this is a safe procedure. However, problems may occur, including:  Exposure to radiation. Radiation levels are very low with this test.  The results being misinterpreted.  The need for further tests.  The inability of the mammogram to detect certain cancers.  What happens before the procedure?  Schedule your test about 1-2 weeks after your menstrual period. This is usually when your breasts are the least tender.  If you have had a mammogram done at a different facility in the past, get the mammogram X-rays or have them sent to your current exam facility in order to compare them.  Wash your breasts and under your arms the day of the test.  Do not wear deodorants, perfumes, lotions, or powders anywhere on your body on the day of the test.  Remove any jewelry from your neck.  Wear clothes that you can change into and out of easily. What happens during the procedure?  You will undress from the waist up and put on a gown.  You will stand in front of the X-ray machine.  Each breast will  be placed between two plastic or glass plates. The plates will compress your breast for a few seconds. Try to stay as relaxed as possible during the procedure. This does not cause any harm to your breasts and any discomfort you feel will be very brief.  X-rays will be taken from different angles of each breast. The procedure may vary among health care providers and hospitals. What happens after the procedure?  The mammogram will be examined by a specialist (radiologist).  You may need to repeat certain parts of the test, depending on the quality of the images. This is commonly done if the radiologist needs a better view of the breast tissue.  Ask when your test  results will be ready. Make sure you get your test results.  You may resume your normal activities. This information is not intended to replace advice given to you by your health care provider. Make sure you discuss any questions you have with your health care provider. Document Released: 06/24/2000 Document Revised: 11/30/2015 Document Reviewed: 09/05/2014 Elsevier Interactive Patient Education  [employer].

## 2017-07-11 LAB — HEPATITIS C ANTIBODY
HEP C AB: NONREACTIVE
SIGNAL TO CUT-OFF: 0.02 (ref <1.00)

## 2017-08-09 ENCOUNTER — Other Ambulatory Visit: Payer: Self-pay

## 2017-08-09 ENCOUNTER — Emergency Department: Payer: Medicare Other

## 2017-08-09 ENCOUNTER — Emergency Department (HOSPITAL_COMMUNITY)
Admit: 2017-08-09 | Discharge: 2017-08-09 | Disposition: A | Discharge status: Home / Self Care | Payer: Medicare Other | Attending: Emergency Medicine | Admitting: Emergency Medicine

## 2017-08-09 ENCOUNTER — Ambulatory Visit (HOSPITAL_COMMUNITY)
Admission: AD | Admit: 2017-08-09 | Discharge: 2017-08-09 | Disposition: A | Discharge status: Home / Self Care | Payer: Medicare Other | Source: Other Acute Inpatient Hospital | Attending: Internal Medicine | Admitting: Internal Medicine

## 2017-08-09 ENCOUNTER — Emergency Department
Admission: EM | Admit: 2017-08-09 | Discharge: 2017-08-09 | DRG: 639 | Payer: Medicare Other | Attending: Internal Medicine | Admitting: Internal Medicine

## 2017-08-09 ENCOUNTER — Encounter: Payer: Self-pay | Admitting: Emergency Medicine

## 2017-08-09 DIAGNOSIS — I441 Atrioventricular block, second degree: Secondary | ICD-10-CM | POA: Diagnosis present

## 2017-08-09 DIAGNOSIS — Z86010 Personal history of colonic polyps: Secondary | ICD-10-CM | POA: Diagnosis not present

## 2017-08-09 DIAGNOSIS — G4700 Insomnia, unspecified: Secondary | ICD-10-CM | POA: Diagnosis not present

## 2017-08-09 DIAGNOSIS — Z7984 Long term (current) use of oral hypoglycemic drugs: Secondary | ICD-10-CM | POA: Diagnosis not present

## 2017-08-09 DIAGNOSIS — I371 Nonrheumatic pulmonary valve insufficiency: Secondary | ICD-10-CM | POA: Diagnosis present

## 2017-08-09 DIAGNOSIS — I50810 Right heart failure, unspecified: Secondary | ICD-10-CM

## 2017-08-09 DIAGNOSIS — I959 Hypotension, unspecified: Secondary | ICD-10-CM | POA: Diagnosis present

## 2017-08-09 DIAGNOSIS — Z90710 Acquired absence of both cervix and uterus: Secondary | ICD-10-CM | POA: Insufficient documentation

## 2017-08-09 DIAGNOSIS — F419 Anxiety disorder, unspecified: Secondary | ICD-10-CM | POA: Diagnosis present

## 2017-08-09 DIAGNOSIS — Z8614 Personal history of Methicillin resistant Staphylococcus aureus infection: Secondary | ICD-10-CM | POA: Insufficient documentation

## 2017-08-09 DIAGNOSIS — R002 Palpitations: Secondary | ICD-10-CM

## 2017-08-09 DIAGNOSIS — Z800 Family history of malignant neoplasm of digestive organs: Secondary | ICD-10-CM | POA: Insufficient documentation

## 2017-08-09 DIAGNOSIS — Z885 Allergy status to narcotic agent status: Secondary | ICD-10-CM | POA: Diagnosis not present

## 2017-08-09 DIAGNOSIS — I454 Nonspecific intraventricular block: Secondary | ICD-10-CM | POA: Diagnosis present

## 2017-08-09 DIAGNOSIS — Z8269 Family history of other diseases of the musculoskeletal system and connective tissue: Secondary | ICD-10-CM | POA: Insufficient documentation

## 2017-08-09 DIAGNOSIS — I5021 Acute systolic (congestive) heart failure: Secondary | ICD-10-CM | POA: Diagnosis not present

## 2017-08-09 DIAGNOSIS — Z87440 Personal history of urinary (tract) infections: Secondary | ICD-10-CM | POA: Diagnosis not present

## 2017-08-09 DIAGNOSIS — R001 Bradycardia, unspecified: Secondary | ICD-10-CM

## 2017-08-09 DIAGNOSIS — Z79890 Hormone replacement therapy (postmenopausal): Secondary | ICD-10-CM | POA: Insufficient documentation

## 2017-08-09 DIAGNOSIS — I442 Atrioventricular block, complete: Secondary | ICD-10-CM | POA: Insufficient documentation

## 2017-08-09 DIAGNOSIS — R570 Cardiogenic shock: Secondary | ICD-10-CM | POA: Diagnosis not present

## 2017-08-09 DIAGNOSIS — E871 Hypo-osmolality and hyponatremia: Secondary | ICD-10-CM | POA: Insufficient documentation

## 2017-08-09 DIAGNOSIS — I459 Conduction disorder, unspecified: Secondary | ICD-10-CM

## 2017-08-09 DIAGNOSIS — I5023 Acute on chronic systolic (congestive) heart failure: Secondary | ICD-10-CM | POA: Diagnosis not present

## 2017-08-09 DIAGNOSIS — R0602 Shortness of breath: Secondary | ICD-10-CM | POA: Diagnosis not present

## 2017-08-09 DIAGNOSIS — Z952 Presence of prosthetic heart valve: Secondary | ICD-10-CM | POA: Insufficient documentation

## 2017-08-09 DIAGNOSIS — Z818 Family history of other mental and behavioral disorders: Secondary | ICD-10-CM | POA: Insufficient documentation

## 2017-08-09 DIAGNOSIS — I361 Nonrheumatic tricuspid (valve) insufficiency: Secondary | ICD-10-CM | POA: Diagnosis not present

## 2017-08-09 DIAGNOSIS — I447 Left bundle-branch block, unspecified: Secondary | ICD-10-CM | POA: Diagnosis not present

## 2017-08-09 DIAGNOSIS — Z825 Family history of asthma and other chronic lower respiratory diseases: Secondary | ICD-10-CM | POA: Diagnosis not present

## 2017-08-09 DIAGNOSIS — K219 Gastro-esophageal reflux disease without esophagitis: Secondary | ICD-10-CM | POA: Diagnosis not present

## 2017-08-09 DIAGNOSIS — N183 Chronic kidney disease, stage 3 (moderate): Secondary | ICD-10-CM | POA: Diagnosis not present

## 2017-08-09 DIAGNOSIS — E785 Hyperlipidemia, unspecified: Secondary | ICD-10-CM | POA: Insufficient documentation

## 2017-08-09 DIAGNOSIS — Z7951 Long term (current) use of inhaled steroids: Secondary | ICD-10-CM | POA: Insufficient documentation

## 2017-08-09 DIAGNOSIS — Z833 Family history of diabetes mellitus: Secondary | ICD-10-CM | POA: Diagnosis not present

## 2017-08-09 DIAGNOSIS — I509 Heart failure, unspecified: Secondary | ICD-10-CM | POA: Diagnosis not present

## 2017-08-09 DIAGNOSIS — E039 Hypothyroidism, unspecified: Secondary | ICD-10-CM | POA: Diagnosis not present

## 2017-08-09 DIAGNOSIS — F418 Other specified anxiety disorders: Secondary | ICD-10-CM | POA: Insufficient documentation

## 2017-08-09 DIAGNOSIS — R110 Nausea: Secondary | ICD-10-CM | POA: Diagnosis not present

## 2017-08-09 DIAGNOSIS — Z953 Presence of xenogenic heart valve: Secondary | ICD-10-CM | POA: Diagnosis not present

## 2017-08-09 DIAGNOSIS — I472 Ventricular tachycardia: Secondary | ICD-10-CM | POA: Diagnosis present

## 2017-08-09 DIAGNOSIS — Z811 Family history of alcohol abuse and dependence: Secondary | ICD-10-CM | POA: Insufficient documentation

## 2017-08-09 DIAGNOSIS — Z8379 Family history of other diseases of the digestive system: Secondary | ICD-10-CM | POA: Insufficient documentation

## 2017-08-09 DIAGNOSIS — E119 Type 2 diabetes mellitus without complications: Secondary | ICD-10-CM | POA: Insufficient documentation

## 2017-08-09 DIAGNOSIS — Q221 Congenital pulmonary valve stenosis: Secondary | ICD-10-CM | POA: Diagnosis not present

## 2017-08-09 DIAGNOSIS — I110 Hypertensive heart disease with heart failure: Secondary | ICD-10-CM | POA: Diagnosis not present

## 2017-08-09 DIAGNOSIS — Z888 Allergy status to other drugs, medicaments and biological substances status: Secondary | ICD-10-CM | POA: Insufficient documentation

## 2017-08-09 DIAGNOSIS — Z8249 Family history of ischemic heart disease and other diseases of the circulatory system: Secondary | ICD-10-CM | POA: Diagnosis not present

## 2017-08-09 DIAGNOSIS — Z79899 Other long term (current) drug therapy: Secondary | ICD-10-CM | POA: Diagnosis not present

## 2017-08-09 DIAGNOSIS — E1122 Type 2 diabetes mellitus with diabetic chronic kidney disease: Secondary | ICD-10-CM | POA: Diagnosis not present

## 2017-08-09 DIAGNOSIS — J45909 Unspecified asthma, uncomplicated: Secondary | ICD-10-CM | POA: Insufficient documentation

## 2017-08-09 LAB — COMPREHENSIVE METABOLIC PANEL
ALK PHOS: 73 U/L (ref 38–126)
ALT: 17 U/L (ref 14–54)
AST: 38 U/L (ref 15–41)
Albumin: 4 g/dL (ref 3.5–5.0)
Anion gap: 10 (ref 5–15)
BILIRUBIN TOTAL: 0.7 mg/dL (ref 0.3–1.2)
BUN: 26 mg/dL — ABNORMAL HIGH (ref 6–20)
CALCIUM: 9.3 mg/dL (ref 8.9–10.3)
CO2: 18 mmol/L — ABNORMAL LOW (ref 22–32)
CREATININE: 1.18 mg/dL — AB (ref 0.44–1.00)
Chloride: 101 mmol/L (ref 101–111)
GFR calc non Af Amer: 46 mL/min — ABNORMAL LOW (ref >60)
GFR, EST AFRICAN AMERICAN: 53 mL/min — AB (ref >60)
GLUCOSE: 253 mg/dL — AB (ref 65–99)
Potassium: 5 mmol/L (ref 3.5–5.1)
Sodium: 129 mmol/L — ABNORMAL LOW (ref 135–145)
Total Protein: 7.2 g/dL (ref 6.5–8.1)

## 2017-08-09 LAB — BRAIN NATRIURETIC PEPTIDE: B NATRIURETIC PEPTIDE 5: 143 pg/mL — AB (ref 0.0–100.0)

## 2017-08-09 LAB — CBC
HEMATOCRIT: 42.4 % (ref 35.0–47.0)
HEMOGLOBIN: 14.2 g/dL (ref 12.0–16.0)
MCH: 30.6 pg (ref 26.0–34.0)
MCHC: 33.5 g/dL (ref 32.0–36.0)
MCV: 91.4 fL (ref 80.0–100.0)
Platelets: 214 10*3/uL (ref 150–440)
RBC: 4.63 MIL/uL (ref 3.80–5.20)
RDW: 13.3 % (ref 11.5–14.5)
WBC: 7.3 10*3/uL (ref 3.6–11.0)

## 2017-08-09 LAB — ECHOCARDIOGRAM COMPLETE
Height: 61 in
Weight: 2240 oz

## 2017-08-09 LAB — GLUCOSE, CAPILLARY: GLUCOSE-CAPILLARY: 114 mg/dL — AB (ref 65–99)

## 2017-08-09 LAB — TROPONIN I: Troponin I: <0.03 ng/mL (ref <0.03)

## 2017-08-09 MED ORDER — DOPAMINE-DEXTROSE 3.2-5 MG/ML-% IV SOLN
INTRAVENOUS | Status: AC
Start: 2017-08-09 — End: 2017-08-09
  Administered 2017-08-09: 800 mg
  Filled 2017-08-09: qty 250

## 2017-08-09 MED ORDER — SODIUM CHLORIDE 0.9 % IV BOLUS (SEPSIS)
1000.0000 mL | Freq: Once | INTRAVENOUS | Status: AC
  Administered 2017-08-09: 1000 mL via INTRAVENOUS

## 2017-08-09 MED ORDER — ONDANSETRON HCL 4 MG/2ML IJ SOLN
INTRAMUSCULAR | Status: AC
  Administered 2017-08-09: 4 mg
  Filled 2017-08-09: qty 2

## 2017-08-09 MED ORDER — ATROPINE SULFATE 1 MG/10ML IJ SOSY
PREFILLED_SYRINGE | INTRAMUSCULAR | Status: AC
  Administered 2017-08-09: 0.5 mg
  Filled 2017-08-09: qty 10

## 2017-08-09 NOTE — Consult Note (Addendum)
[facility] [place] NAME: Haley Young    MR#:  [id number]  DATE OF BIRTH:  1951/04/26  DATE OF ADMISSION:  08/09/2017  PRIMARY Young PHYSICIAN: [name], MD   REQUESTING/REFERRING PHYSICIAN: Harvest Dark, MD  CHIEF COMPLAINT:   Chief Complaint  Patient presents with  . Shortness of Breath  . Palpitations   Shortness of breath and palpitation. HISTORY OF PRESENT ILLNESS:  Haley Young  is a 67 y.o. female with a known history of of medical problems as below.  The patient started to have shortness of breath since last night which is on and off.  She denies any orthopnea or nocturnal dyspnea or leg edema.  She was found bradycardia at 50s-60s but occasionally job to 30s in the ED.  ED physician discussed with Dr. Ernestine Mcmurray who suggested the patient has CHF and start Lasix IV.  However the patient developed Mobitz 2 second-degree AV block.  I discussed with Dr. Rockey Situ, who suggested admitted to stepdown unit.  The patient wants to be transferred to [facility] since her cardiologist is there.  The patient then developed hypotension at 73/54 and complete AV block with heart rate dropped to 27.  She was given 1 dose of atropine and dopamine drip is started.  PAST MEDICAL HISTORY:   Past Medical History:  Diagnosis Date  . Asthma   . Chicken pox   . Depression   . Diabetes mellitus   . Frequent UTI   . GERD (gastroesophageal reflux disease)   . Heart disease   . Heart murmur   . 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 Hx of colonic polyps   . Hyperhidrosis    especially with hot flashes    . Hyperlipidemia   . Lipoma    R axilla   . MRSA cellulitis    surgical wound infection  . Osteoporosis   . Pyelocystitis    as a child   . Thyroid disease   . Urinary incontinence     PAST SURGICAL HISTORY:   Past Surgical History:  Procedure Laterality Date  . ABDOMINAL HYSTERECTOMY  1970  . [place], 2005   pulmonary stenosis with valve replacement, Dr. Evelina Dun  . PARTIAL HYSTERECTOMY  1970  . PULMONARY VEIN STENOSIS REPAIR    . VALVE REPLACEMENT      SOCIAL HISTORY:   Social History   Tobacco Use  . Smoking status: Never Smoker  . Smokeless tobacco: Never Used  Substance Use Topics  . Alcohol use: Yes    Comment: occasional wine     FAMILY HISTORY:   Family History  Problem Relation Age of Onset  . Coarctation of the aorta Sister   . Diabetes Mother   . COPD Mother   . Obesity Mother   . Depression Mother   . Pancreatic cancer Mother   . Alcohol abuse Father   . Cirrhosis Father   . Down syndrome Brother   . Pneumonia Brother   . Down syndrome Daughter   . Lupus Daughter   . Ulcerative colitis Daughter     DRUG ALLERGIES:   Allergies  Allergen Reactions  . Oxycodone Hcl     REACTION: hallucinations  . Albuterol Anxiety    Other reaction(s): Other (See Comments) Tremors   . Oxycodone Hcl Other (See Comments)    halucinations    REVIEW OF SYSTEMS:   Review of Systems  Constitutional: Negative for chills, fever and malaise/fatigue.  HENT: Negative for sore throat.   Eyes: Negative for blurred vision and double vision.  Respiratory: Positive for shortness of breath. Negative for cough, hemoptysis, wheezing and stridor.   Cardiovascular: Positive for palpitations. Negative for chest pain, orthopnea and leg swelling.  Gastrointestinal: Negative for abdominal pain, blood in stool, diarrhea, melena, nausea and vomiting.  Genitourinary: Negative for dysuria, flank pain and hematuria.  Musculoskeletal: Negative for back pain and joint pain.  Skin: Negative for rash.  Neurological: Negative for dizziness, sensory change, focal weakness, seizures, loss of consciousness, weakness and headaches.  Endo/Heme/Allergies: Negative for polydipsia.  Psychiatric/Behavioral: Negative for depression. The patient is not nervous/anxious.     MEDICATIONS AT  HOME:   Prior to Admission medications   Medication Sig Start Date End Date Taking? Authorizing Provider  atorvastatin (LIPITOR) 20 MG tablet TAKE ONE TABLET BY MOUTH ONCE DAILY 01/04/17  Yes Philemon Kingdom, MD  fluticasone (FLOVENT HFA) 110 MCG/ACT inhaler Inhale 2 puffs into the lungs 2 (two) times daily. 05/10/17  Yes Elby Beck, FNP  glipiZIDE (GLUCOTROL XL) 5 MG 24 hr tablet Please take 2 tablets in the morning, 1 tablet in the evening. Patient taking differently: Take 5 mg by mouth 3 (three) times daily.  11/30/16  Yes Philemon Kingdom, MD  glucose blood (ONE TOUCH ULTRA TEST) test strip USE ONE STRIP TO CHECK GLUCOSE THREE TIMES DAILY AS DIRECTED E11.65 09/06/16  Yes Philemon Kingdom, MD  levothyroxine (SYNTHROID, LEVOTHROID) 125 MCG tablet TAKE ONE TABLET BY MOUTH ONCE DAILY 05/02/17  Yes Philemon Kingdom, MD  LORazepam (ATIVAN) 1 MG tablet Take 1 tablet (1 mg total) 2 (two) times daily as needed by mouth. for anxiety Patient taking differently: Take 0.5 mg by mouth at bedtime. for anxiety 05/17/17  Yes [name], MD  metFORMIN (GLUCOPHAGE) 1000 MG tablet TAKE 1 TABLET BY MOUTH TWICE DAILY WITH MEALS 05/02/17  Yes Philemon Kingdom, MD  traZODone (DESYREL) 150 MG tablet TAKE 1 TABLET BY MOUTH AT BEDTIME 05/02/17  Yes [name], MD  escitalopram (LEXAPRO) 5 MG tablet TAKE 1 TABLET BY MOUTH ONCE DAILY Patient not taking: Reported on 08/09/2017 05/02/17   [name], MD      VITAL SIGNS:  Blood pressure (!) 122/109, pulse (!) 28, temperature 98.2 F (36.8 C), temperature source Oral, resp. rate 14, height 5\' 1"  (1.549 m), weight 140 lb (63.5 kg), SpO2 100 %.  PHYSICAL EXAMINATION:  Physical Exam  GENERAL:  67 y.o.-year-old patient lying in the bed with no acute distress.  EYES: Pupils equal, round, reactive to light and accommodation. No scleral icterus. Extraocular muscles intact.  HEENT: Head atraumatic, normocephalic. Oropharynx and nasopharynx clear.    NECK:  Supple, no jugular venous distention. No thyroid enlargement, no tenderness.  LUNGS: Normal breath sounds bilaterally, no wheezing, rales,rhonchi or crepitation. No use of accessory muscles of respiration.  CARDIOVASCULAR: S1, S2 normal. Positive heart murmurs, no rubs, or gallops.  ABDOMEN: Soft, nontender, nondistended. Bowel sounds present. No organomegaly or mass.  EXTREMITIES: No pedal edema, cyanosis, or clubbing.  NEUROLOGIC: Cranial nerves II through XII are intact. Muscle strength 5/5 in all extremities. Sensation intact. Gait not checked.  PSYCHIATRIC: The patient is alert and oriented x 3.  SKIN: No obvious rash, lesion, or ulcer.   LABORATORY PANEL:   CBC Recent Labs  Lab 08/09/17 1123  WBC 7.3  HGB 14.2  HCT 42.4  PLT 214   ------------------------------------------------------------------------------------------------------------------  Chemistries  Recent Labs  Lab 08/09/17 1123  NA 129*  K 5.0  CL 101  CO2 18*  GLUCOSE 253*  BUN 26*  CREATININE 1.18*  CALCIUM 9.3  AST 38  ALT 17  ALKPHOS 73  BILITOT 0.7   ------------------------------------------------------------------------------------------------------------------  Cardiac Enzymes Recent Labs  Lab 08/09/17 1123  TROPONINI <0.03   ------------------------------------------------------------------------------------------------------------------  RADIOLOGY:  Dg Chest 2 View  Result Date: 08/09/2017 CLINICAL DATA:  Shortness of breath for a week.  Nonsmoker. EXAM: CHEST  2 VIEW COMPARISON:  10/18/2016 FINDINGS: Prior median sternotomy. Moderate thoracic spondylosis. Convex right thoracic spine curvature. Midline trachea. Mild cardiomegaly. Prior pulmonary valve repair. No pleural effusion or pneumothorax. No overt congestive failure. Left base scarring. IMPRESSION: No acute cardiopulmonary disease. Cardiomegaly without congestive failure. Electronically Signed   By: Abigail Miyamoto M.D.   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  But she developed Mobitz 2 second-degree AV block but then complete AV block.  Started dopamine drip.   Pt is on pacer pads.   Hypotension.  Cardiogenic due to above. The patient is given normal saline bolus and started dopamine drip.  Acute systolic and CHF LV EF: 27% -   45% Start Lasix 40 mg IV twice daily per Dr. Rockey Situ.  Hold Lasix due to hypotension.  Hyponatremia, the patient is given normal saline IV. Diabetes.  Hold metformin and start sliding scale.  Discussed with Dr. Rockey Situ and Dr.Paduchowski.  The patient want to be transferred to [place] is coordinating transferring to [facility]. All the records are reviewed and case discussed with ED provider. Management plans discussed with the patient, family and they are in agreement.  CODE STATUS: Full code  TOTAL CRITICAL TIME TAKING Young OF THIS PATIENT: 62 minutes.    Demetrios Loll M.D on 08/09/2017 at 7:07 PM  Between 7am to 6pm - Pager - [phone]  After 6pm go to [email or web address] - Proofreader  [facility]  Office  [phone]  CC: Primary Young physician; [name], MD   Note: This dictation was prepared with Dragon dictation along with smaller phrase technology. Any transcriptional errors that result from this process are unin

## 2017-08-09 NOTE — ED Notes (Signed)
Pt began having increasing sob and HR dropping into 20s, pt was diaphoretic and appeared to be gasping to breathe.  Pt remains conscious.  EDP called to bedside.  Pt was placed on dopamine drip at that time.  Pt had periods of 5:1 conversion (5p waves to one qrs).  RN continues to be at the bedside, pt is still on pads and crash cart remains at the bedside.  Await further orders, will continue to monitor

## 2017-08-09 NOTE — ED Notes (Signed)
Pt HR in mid 30's.  EKG done and given to EDP.  On monitor it appears as though every other p wave is not folowed by QRS.  Pt denies any CP or sob with this.  Pt denies any dizziness or other problems.  Crash cart brought to the bedside, will place on pads as discussed with EDP

## 2017-08-09 NOTE — ED Notes (Signed)
Echocardiogram being performed at this time at bedside.

## 2017-08-09 NOTE — ED Notes (Signed)
Report was given to [facility] when they were 20-25 minutes out.  Spoke with [facility] about 10 minutes later and they said that they would not be able to get here before but to let them know if anything changes. [facility] arrives at this time

## 2017-08-09 NOTE — ED Notes (Signed)
EMTALA reviewed. 

## 2017-08-09 NOTE — ED Notes (Addendum)
Family calls out that pt cant't breathe as occurred several times before.  Pt found gasping.  Alert and with spo2 continued at 99% on 2L [place].  Order to increase dopamine drip received and carried out.    Call received from [facility] transfer that pt has a bed assignment, I received the following information:   [facility]  [place]. (life flight number [phone]) [place]  Number for report: [phone]  Lavonna Monarch

## 2017-08-09 NOTE — Progress Notes (Signed)
*  PRELIMINARY RESULTS* Echocardiogram 2D Echocardiogram has been performed.  Haley Young 08/09/2017, 1:34 PM

## 2017-08-09 NOTE — ED Notes (Addendum)
Patient reports feeling SOB and palpitations, denies pain. O2 sats 96-98% at this time HR at 70s-50s. MD made aware.

## 2017-08-09 NOTE — ED Notes (Signed)
EDP notified to see if he wants to check pt Mg.  CBG checked

## 2017-08-09 NOTE — ED Notes (Signed)
Admitting MD notified of change in cardiac rhythm.  Admitting MD is at the bedside at this time.  Await further orders

## 2017-08-09 NOTE — ED Notes (Signed)
V.O by Dr. Mamie Nick for 0.5mg  Atropine IVP.

## 2017-08-09 NOTE — ED Notes (Addendum)
EDP notified that pt has been brady to 30.  Pt still have crash cart in room and is on the pads.  At this time pt is asymptomatic, no sob or CP or dizziness with this bradycardia.  Pt does state that she feels tired.  Await further orders

## 2017-08-09 NOTE — ED Notes (Signed)
Report given to Nurse, children's at [facility].  Await transport

## 2017-08-09 NOTE — ED Notes (Signed)
Pt called out with SOB.  Pt was gasping for air. Spo2 99%.  Placed pt on 2L [place].  Pt is still on pacer pads. EDP to the bedside.  Pt BP stable.  Pt is having up to 4 p waves without QRS to follow as well as what appears to be a short run of V-tach.  EKG's done and given to EDP, Dr. Mamie Nick.

## 2017-08-09 NOTE — ED Triage Notes (Signed)
Pt present to ED with complaints of SOB, Weakness and felt that her heart was racing. She reports that she was setting up for an event and became SOB. EMS gave 324 of ASA.

## 2017-08-09 NOTE — ED Notes (Signed)
Pt left with [facility] 

## 2017-08-09 NOTE — ED Provider Notes (Addendum)
[facility] Emergency Department Provider Note  Time seen: 11:34 AM  I have reviewed the triage vital signs and the nursing notes.   HISTORY  Chief Complaint Shortness of Breath and Palpitations    HPI Haley Young is a 67 y.o. female with a past medical history of diabetes, depression, pulmonary valve replacement 2005, hyperlipidemia, presents to the emergency department for shortness of breath.  According to the patient last night she was feeling very short of breath while attempting to help out at her church.  States this morning the shortness of breath continued.  Patient had a sensation of palpitations thought her heart might be racing, EMS was called, EMS states her heart rate was mostly in the 50s or 60s with occasionally drop as low as 39 bpm on the monitor.  Patient denies any chest pain or nausea.  Denies any leg swelling.  Patient states her last echocardiogram was approximately 6 months ago at [facility] where all of her cardiac care is.  Past Medical History:  Diagnosis Date  . Asthma   . Chicken pox   . Depression   . Diabetes mellitus   . Frequent UTI   . GERD (gastroesophageal reflux disease)   . Heart disease   . Heart murmur   . 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 Hx of colonic polyps   . Hyperhidrosis    especially with hot flashes    . Hyperlipidemia   . Lipoma    R axilla   . MRSA cellulitis    surgical wound infection  . Osteoporosis   . Pyelocystitis    as a child   . Thyroid disease   . Urinary incontinence     Patient Active Problem List   Diagnosis Date Noted  . Decreased GFR 05/18/2017  . Hyponatremia 11/01/2016  . Hyperkalemia 04/19/2016  . Drug-induced vitamin B12 deficiency anemia 04/16/2016  . Type 2 diabetes mellitus with hyperglycemia, without long-term current use of insulin ([place]) 08/21/2015  . Fatigue 03/27/2015  . Overweight (BMI 25.0-29.9) 10/07/2014  . Depression with anxiety  08/08/2014  . Medicare annual wellness visit, initial 09/13/2013  . Hyperlipidemia associated with type 2 diabetes mellitus ([place]) 07/25/2013  . Insomnia 07/25/2013  . Hypothyroidism 03/29/2012  . Screening for breast cancer 03/29/2012  . Screening for colon cancer 03/29/2012  . PULMONARY VALVE INSUFFICIENCY 08/13/2008  . GERD 08/13/2008  . Osteopenia 08/13/2008  . COLONIC POLYPS, HX OF 08/13/2008    Past Surgical History:  Procedure Laterality Date  . ABDOMINAL HYSTERECTOMY  1970  . [place], 2005   pulmonary stenosis with valve replacement, Dr. Evelina Dun  . PARTIAL HYSTERECTOMY  1970  . PULMONARY VEIN STENOSIS REPAIR    . VALVE REPLACEMENT      Prior to Admission medications   Medication Sig Start Date End Date Taking? Authorizing Provider  glucose blood (ONE TOUCH ULTRA TEST) test strip USE ONE STRIP TO CHECK GLUCOSE THREE TIMES DAILY AS DIRECTED E11.65 09/06/16  Yes Philemon Kingdom, MD  atorvastatin (LIPITOR) 20 MG tablet TAKE ONE TABLET BY MOUTH ONCE DAILY 01/04/17   Philemon Kingdom, MD  escitalopram (LEXAPRO) 5 MG tablet TAKE 1 TABLET BY MOUTH ONCE DAILY 05/02/17   [name], MD  fluticasone (FLOVENT HFA) 110 MCG/ACT inhaler Inhale 2 puffs into the lungs 2 (two) times daily. 05/10/17   Elby Beck, FNP  glipiZIDE (GLUCOTROL XL) 5 MG 24 hr tablet Please take 2 tablets  in the morning, 1 tablet in the evening. 11/30/16   Philemon Kingdom, MD  levothyroxine (SYNTHROID, LEVOTHROID) 125 MCG tablet TAKE ONE TABLET BY MOUTH ONCE DAILY 05/02/17   Philemon Kingdom, MD  LORazepam (ATIVAN) 1 MG tablet Take 1 tablet (1 mg total) 2 (two) times daily as needed by mouth. for anxiety 05/17/17   [name], MD  metFORMIN (GLUCOPHAGE) 1000 MG tablet TAKE 1 TABLET BY MOUTH TWICE DAILY WITH MEALS 05/02/17   Philemon Kingdom, MD  traZODone (DESYREL) 150 MG tablet TAKE 1 TABLET BY MOUTH AT BEDTIME 05/02/17   [name], MD    Allergies  Allergen Reactions  .  Oxycodone Hcl     REACTION: hallucinations  . Albuterol Anxiety    Other reaction(s): Other (See Comments) Tremors   . Oxycodone Hcl Other (See Comments)    halucinations    Family History  Problem Relation Age of Onset  . Coarctation of the aorta Sister   . Diabetes Mother   . COPD Mother   . Obesity Mother   . Depression Mother   . Pancreatic cancer Mother   . Alcohol abuse Father   . Cirrhosis Father   . Down syndrome Brother   . Pneumonia Brother   . Down syndrome Daughter   . Lupus Daughter   . Ulcerative colitis Daughter     Social History Social History   Tobacco Use  . Smoking status: Never Smoker  . Smokeless tobacco: Never Used  Substance Use Topics  . Alcohol use: Yes    Comment: occasional wine   . Drug use: No    Review of Systems Constitutional: Negative for fever. Eyes: Negative for visual complaints ENT: Negative for recent illness/congestion Cardiovascular: Negative for chest pain.  Occasional palpitation. Respiratory: Positive for shortness of breath Gastrointestinal: Negative for abdominal pain, vomiting Genitourinary: Negative for urinary compaints Musculoskeletal: Negative for leg pain or swelling Skin: Negative for skin complaints  Neurological: Negative for headache All other ROS negative  ____________________________________________   PHYSICAL EXAM:  VITAL SIGNS: ED Triage Vitals  Enc Vitals Group     BP 08/09/17 1121 (!) 138/54     Pulse Rate 08/09/17 1121 (!) 56     Resp 08/09/17 1121 19     Temp 08/09/17 1121 98.2 F (36.8 C)     Temp Source 08/09/17 1121 Oral     SpO2 08/09/17 1121 97 %     Weight 08/09/17 1124 140 lb (63.5 kg)     Height 08/09/17 1124 5\' 1"  (1.549 m)     Head Circumference --      Peak Flow --      Pain Score 08/09/17 1123 0     Pain Loc --      Pain Edu? --      Excl. in [place]? --    Constitutional: Alert and oriented. Well appearing and in no distress. Eyes: Normal exam ENT   Head:  Normocephalic and atraumatic.   Mouth/Throat: Mucous membranes are moist. Cardiovascular: Irregular rhythm, rate around 70 bpm.  Patient does have a 2/6 murmur. Respiratory: Normal respiratory effort without tachypnea nor retractions. Breath sounds are clear  Gastrointestinal: Soft and nontender. No distention.  Musculoskeletal: Nontender with normal range of motion in all extremities. No lower extremity tenderness or edema. Neurologic:  Normal speech and language. No gross focal neurologic deficits Skin:  Skin is warm, dry and intact.  Psychiatric: Mood and affect are normal.  ____________________________________________    EKG  EKG shows  a sinus rhythm at 54 bpm with a slightly widened QRS, right axis deviation, largely normal intervals no concerning ST changes.  Repeat EKG reviewed and interpreted by myself 15: 36: 33 shows a sinus rhythm at 74 with occasional dropped beat possibly consistent with a second-degree block.  Slightly widened QRS, right axis deviation, prolonged QTC, nonspecific ST changes.  ____________________________________________    RADIOLOGY  Chest x-ray negative  ____________________________________________   INITIAL IMPRESSION / ASSESSMENT AND PLAN / ED COURSE  Pertinent labs & imaging results that were available during my care of the patient were reviewed by me and considered in my medical decision making (see chart for details).  Patient presents to the emergency department for shortness of breath since yesterday.  Continues to state mild shortness of breath which she describes as not being able to take a full deep breath.  Current heart rate around 50-60 bpm.  97-98% on room air.  Differential would include ACS, pneumonia, pneumothorax, pulmonary edema, pulmonary regurgitation.  Overall the patient appears very well, calm, cooperative, appears comfortable and in no distress.  I reviewed the patient's records, last echocardiogram appears to be in May  when she had moderate pulmonary regurgitation at that time which could explain her murmur however those notes state no murmur.  We will check labs including cardiac enzymes, chest x-ray and attempt to obtain an echocardiogram from the emergency department.   Patient continues to be short of breath significant shortness of breath especially with any type of exertion.  Discussed with Dr. Rockey Situ of cardiology the patient's echocardiogram.  States increased right-sided pressures with moderate regurgitation.  States she would benefit from diuresis.  Given the patient's symptomatic nature irregular heartbeat and echocardiogram findings we will begin IV diuresis and admit to the hospital.   Repeat EKG reviewed and interpreted by myself shows bradycardia at 35 bpm with a widened QRS, right axis, largely normal intervals.  Now the patient appears to be having a 3-1 AV block.  Patient is largely asymptomatic.  We will place on the zoll monitor and continue to closely monitor.  We will discuss with cardiology once again.  Patient's blood pressure currently 105/87.  Discussed with Dr. Rockey Situ we will continue to closely monitor.  We will admit on telemetry.  ----------------------------------------- 5:42 PM on 08/09/2017 -----------------------------------------  Patient has become symptomatic, heart rate now sustained in the low to mid 30s.  Patient has had several prolonged episodes of P waves with no ventricular conduction.  At times appear to be in complete heart block.  Repeat EKG reviewed and interpreted by myself 17: 37: 29 shows bradycardia with possible complete heart block.  Slightly widened QRS, normal axis, no ST-T elevation.  Given the patient's progressive decline now sustained bradycardia her extensive cardiac history at [facility] we will discuss with [facility] for possible transfer to their facility as the patient may require a pacemaker.  Patient agreeable to this plan of care.  Remains alert and  oriented x4.  Will occasionally feel short of breath without appears to be her only symptom.  ----------------------------------------- 8:22 PM on 08/09/2017 -----------------------------------------  Patient initially accepted to [facility].  However we are called back a short time later saying that they are on diversion and cannot accept the patient.  Patient was then accepted to [facility].  I discussed the patient with the CCU attending at the regional hospital who is accepted the patient, pending bed availability.  We are informed approximate hour later that there was a bed  available for the patient but they had no immediate transport available.  Patient will require ICU transport given the severity of her illness.  Currently awaiting ICU transport which I am told should be very soon.  Patient remains very stable however several times her heart rate has dropped into the mid to low 20s and she has felt very symptomatic and at one point became diaphoretic.  Have initiated dopamine infusion for the patient.  Awaiting ICU transport to [facility].  CRITICAL CARE Performed by: Harvest Dark   Total critical care time: 120 minutes  Critical care time was exclusive of separately billable procedures and treating other patients.  Critical care was necessary to treat or prevent imminent or life-threatening deterioration.  Critical care was time spent personally by me on the following activities: development of treatment plan with patient and/or surrogate as well as nursing, discussions with consultants, evaluation of patient's response to treatment, examination of patient, obtaining history from patient or surrogate, ordering and performing treatments and interventions, ordering and review of laboratory studies, ordering and review of radiographic studies, pulse oximetry and re-evaluation of patient's condition.   ____________________________________________   FINAL CLINICAL  IMPRESSION(S) / ED DIAGNOSES  Shortness of breath bradycardia Heart Block   Harvest Dark, MD 08/09/17 1547    Harvest Dark, MD 08/09/17 1647    Harvest Dark, MD 08/09/17 2024

## 2017-08-09 NOTE — ED Notes (Signed)
[facility] has loaded pt, pt vomited after becoming nauseated.  Zofran given IVP per protocol

## 2017-08-09 NOTE — ED Notes (Signed)
Increasing shortness of breath for some time.  Pt states that this is with activity.  Pt is alert and oriented at this time, she appears in no distress.  No CP or SOB at this time as she is resting.  Delay explained to pt.

## 2017-08-11 ENCOUNTER — Telehealth: Payer: Self-pay | Admitting: Internal Medicine

## 2017-08-11 ENCOUNTER — Telehealth: Payer: Self-pay

## 2017-08-11 MED ORDER — ATORVASTATIN CALCIUM 20 MG PO TABS
20.00 | ORAL_TABLET | ORAL | Status: DC
Start: 2017-08-12 — End: 2017-08-11

## 2017-08-11 MED ORDER — DEXTROSE 50 % IV SOLN
12.50 | INTRAVENOUS | Status: DC
Start: ? — End: 2017-08-11

## 2017-08-11 MED ORDER — GENERIC EXTERNAL MEDICATION
Status: DC
Start: ? — End: 2017-08-11

## 2017-08-11 MED ORDER — TRAZODONE HCL 50 MG PO TABS
150.00 | ORAL_TABLET | ORAL | Status: DC
Start: 2017-08-11 — End: 2017-08-11

## 2017-08-11 MED ORDER — INSULIN REGULAR HUMAN 100 UNIT/ML IJ SOLN
0.00 | INTRAMUSCULAR | Status: DC
Start: 2017-08-11 — End: 2017-08-11

## 2017-08-11 MED ORDER — GLUCAGON HCL RDNA (DIAGNOSTIC) 1 MG IJ SOLR
1.00 | INTRAMUSCULAR | Status: DC
Start: ? — End: 2017-08-11

## 2017-08-11 MED ORDER — ESCITALOPRAM OXALATE 10 MG PO TABS
5.00 | ORAL_TABLET | ORAL | Status: DC
Start: 2017-08-12 — End: 2017-08-11

## 2017-08-11 MED ORDER — ACETAMINOPHEN 325 MG PO TABS
650.00 | ORAL_TABLET | ORAL | Status: DC
Start: ? — End: 2017-08-11

## 2017-08-11 MED ORDER — GENERIC EXTERNAL MEDICATION
137.00 | Status: DC
Start: 2017-08-12 — End: 2017-08-11

## 2017-08-11 NOTE — Telephone Encounter (Signed)
Left message for patient to return call.

## 2017-08-11 NOTE — Telephone Encounter (Signed)
Copied from [place]. Topic: Inquiry >> Aug 11, 2017  3:04 PM Corie Chiquito, [place] wrote: Reason for CRM: Patient calling because she would like to speak with Dr.Tullo's nurse about her pace-maker. If she could give her a call back at [phone]

## 2017-08-11 NOTE — Telephone Encounter (Signed)
Second attempt made for TCM  Could not leave message voicemail full.

## 2017-08-14 NOTE — Telephone Encounter (Signed)
Transition Care Management Follow-up Telephone Call  How have you been since you were released from the hospital? Patient dischargedfrom [place] on 08/11/17, feeling better since pacemaker placement was advised to follow up with PCP ASAP patient scheduled for 08/17/17   Do you understand why you were in the hospital? yes   Do you understand the discharge instrcutions? yes  Items Reviewed:  Medications reviewed: yes  Allergies reviewed: yes  Dietary changes reviewed: yes  Referrals reviewed: yes   Functional Questionnaire:   Activities of Daily Living (ADLs):   She states they are independent in the following: ambulation, bathing and hygiene, feeding, continence, grooming, toileting and dressing States they require assistance with the following: no assistance needed   Any transportation issues/concerns?: no   Any patient concerns? No   Confirmed importance and date/time of follow-up visits scheduled: yes   Confirmed with patient if condition begins to worsen call PCP or go to the ER.  Patient was given the Call-a-Nurse line [phone]: yes

## 2017-08-17 ENCOUNTER — Ambulatory Visit (INDEPENDENT_AMBULATORY_CARE_PROVIDER_SITE_OTHER): Payer: Medicare Other

## 2017-08-17 ENCOUNTER — Ambulatory Visit (INDEPENDENT_AMBULATORY_CARE_PROVIDER_SITE_OTHER): Payer: Medicare Other | Admitting: Internal Medicine

## 2017-08-17 ENCOUNTER — Encounter: Payer: Self-pay | Admitting: Internal Medicine

## 2017-08-17 VITALS — BP 112/66 | HR 112 | Temp 98.7°F | Resp 16 | Ht 61.0 in | Wt 146.8 lb

## 2017-08-17 DIAGNOSIS — R000 Tachycardia, unspecified: Secondary | ICD-10-CM | POA: Diagnosis not present

## 2017-08-17 DIAGNOSIS — H6502 Acute serous otitis media, left ear: Secondary | ICD-10-CM

## 2017-08-17 DIAGNOSIS — R0602 Shortness of breath: Secondary | ICD-10-CM | POA: Diagnosis not present

## 2017-08-17 DIAGNOSIS — I442 Atrioventricular block, complete: Secondary | ICD-10-CM

## 2017-08-17 DIAGNOSIS — J069 Acute upper respiratory infection, unspecified: Secondary | ICD-10-CM | POA: Diagnosis not present

## 2017-08-17 DIAGNOSIS — H6690 Otitis media, unspecified, unspecified ear: Secondary | ICD-10-CM | POA: Insufficient documentation

## 2017-08-17 LAB — CBC WITH DIFFERENTIAL/PLATELET
BASOS PCT: 0.5 % (ref 0.0–3.0)
Basophils Absolute: 0 10*3/uL (ref 0.0–0.1)
EOS ABS: 0.3 10*3/uL (ref 0.0–0.7)
Eosinophils Relative: 3 % (ref 0.0–5.0)
HEMATOCRIT: 42.1 % (ref 36.0–46.0)
Hemoglobin: 14.1 g/dL (ref 12.0–15.0)
LYMPHS PCT: 23 % (ref 12.0–46.0)
Lymphs Abs: 2.4 10*3/uL (ref 0.7–4.0)
MCHC: 33.4 g/dL (ref 30.0–36.0)
MCV: 90.7 fl (ref 78.0–100.0)
MONOS PCT: 7.3 % (ref 3.0–12.0)
Monocytes Absolute: 0.8 10*3/uL (ref 0.1–1.0)
NEUTROS ABS: 6.9 10*3/uL (ref 1.4–7.7)
Neutrophils Relative %: 66.2 % (ref 43.0–77.0)
PLATELETS: 255 10*3/uL (ref 150.0–400.0)
RBC: 4.64 Mil/uL (ref 3.87–5.11)
RDW: 13.2 % (ref 11.5–15.5)
WBC: 10.4 10*3/uL (ref 4.0–10.5)

## 2017-08-17 MED ORDER — AMOXICILLIN-POT CLAVULANATE 875-125 MG PO TABS: 1.0000 | ORAL_TABLET | Freq: Two times a day (BID) | ORAL | 0 refills | Status: DC

## 2017-08-17 MED ORDER — METOPROLOL SUCCINATE ER 25 MG PO TB24: 25.0000 mg | ORAL_TABLET | Freq: Every day | ORAL | 1 refills | Status: DC

## 2017-08-17 NOTE — Assessment & Plan Note (Signed)
S/p pacemaker implant Feb 1 at [facility].  EKG done today showing paced rhythm at 98 bpm .  Will forward to her cardiologist

## 2017-08-17 NOTE — Patient Instructions (Signed)
I am starting you on metoprolol XL to slow your heart rate down since it is making you short of breath  I am starting you on Augmentin for the ear infection .  This will also cover pneumonia,  But I do not think you have pneumonia (chest x ray results will be relayed to you ASAP)    You should avoid oral decongestants because they can speed up your heart rate again  However you can and should use Afrin nasal spray every 12 hours for the next 3 to 5 days to manage the sinus congestion   Benadryl for your post nasal drip at bedtime only   Please take a probiotic ( Align, Floraque or Culturelle) for 3 weeks  to prevent a serious antibiotic associated diarrhea  Called clostirudium dificile colitis and a vaginal yeast infection

## 2017-08-17 NOTE — Assessment & Plan Note (Signed)
Left ear.  Augmenitn,  Afrin.  Avoiding prednisone given post op status from pacer implant Feb 1

## 2017-08-17 NOTE — Assessment & Plan Note (Addendum)
Unclear if her current tachycardia is secondary  to acute illness or underlying cardiac disease.  Given that she is symptomatic (dyspneic) and s/p pacemaker implant on Feb 2 for [facility], will check chest x ray,  start metoprolol xl 25 mg and have her follow up with cardiologist ASAP .   EKG shows a paced rhythm  at 98 bpm today

## 2017-08-17 NOTE — Progress Notes (Signed)
Subjective:  Patient ID: Haley Young, female    DOB: 17-Feb-1951  Age: 67 y.o. MRN: [id number]  CC: The primary encounter diagnosis was Tachycardia. Diagnoses of Shortness of breath, Viral upper respiratory tract infection, Acute serous otitis media of left ear, recurrence not specified, and CHB (complete heart block) ([place]) were also pertinent to this visit.  HPI SARAGRACE SELKE presents for hospital follow up.  Admitted to [facility] from ER on jan 30th  With sudden onset of dyspnea and palpitations.  Was bradycardic  to 30's in ED secondary to [facility] 2 second degree AV block .  Developed CHB with hypotension and admitted to ICU on dopamine drip.  Patient was  stabilized and transferred to [facility]  On Jan 31  per patient request . (she has a History of congenital PVS s/p pulmonic valve replacement at [facility] in 2005.  ) she  underwent pacemaker implantation on Jan 31 and discharged home on Feb 1    Patient is not feeling well today.  States that she has been having ear pain and thick sinus drainage for the last 7  days .and has been having fevers to 102 at home.   Notes that her sore throat started before hospital discharge but she was todl it was due ot her intubation.   Patient is tachycardic and short of breath today,  Had a fever 102 tuesday night ,  No body aches, but chest wall hurting  And right ear hurts   Outpatient Medications Prior to Visit  Medication Sig Dispense Refill  . atorvastatin (LIPITOR) 20 MG tablet TAKE ONE TABLET BY MOUTH ONCE DAILY 90 tablet 1  . escitalopram (LEXAPRO) 5 MG tablet TAKE 1 TABLET BY MOUTH ONCE DAILY 90 tablet 0  . fluticasone (FLOVENT HFA) 110 MCG/ACT inhaler Inhale 2 puffs into the lungs 2 (two) times daily. 1 Inhaler 12  . glipiZIDE (GLUCOTROL XL) 5 MG 24 hr tablet Please take 2 tablets in the morning, 1 tablet in the evening. (Patient taking differently: Take 5 mg by mouth 3 (three) times daily. ) 270 tablet 3  . glucose blood (ONE TOUCH ULTRA TEST) test  strip USE ONE STRIP TO CHECK GLUCOSE THREE TIMES DAILY AS DIRECTED E11.65 200 each 2  . levothyroxine (SYNTHROID, LEVOTHROID) 125 MCG tablet TAKE ONE TABLET BY MOUTH ONCE DAILY 90 tablet 3  . LORazepam (ATIVAN) 1 MG tablet Take 1 tablet (1 mg total) 2 (two) times daily as needed by mouth. for anxiety (Patient taking differently: Take 0.5 mg by mouth at bedtime. for anxiety) 60 tablet 3  . metFORMIN (GLUCOPHAGE) 1000 MG tablet TAKE 1 TABLET BY MOUTH TWICE DAILY WITH MEALS 180 tablet 0  . traZODone (DESYREL) 150 MG tablet TAKE 1 TABLET BY MOUTH AT BEDTIME 90 tablet 1   No facility-administered medications prior to visit.     Review of Systems;  Patient denies  unintentional weight loss, skin rash, eye pain, , dysphagia,  hemoptysis , producgiv cough, dyspnea, wheezing, chest pain, palpitations, orthopnea, edema, abdominal pain, nausea, melena, diarrhea, constipation, flank pain, dysuria, hematuria, urinary  Frequency, nocturia, numbness, tingling, seizures,  Focal weakness, Loss of consciousness,  Tremor, insomnia, depression, anxiety, and suicidal ideation.      Objective:  BP 112/66 (BP Location: Left Arm, Patient Position: Sitting, Cuff Size: Normal)   Pulse (!) 112   Temp 98.7 F (37.1 C) (Oral)   Resp 16   Ht 5\' 1"  (1.549 m)   Wt 146 lb 12.8 oz (66.6  kg)   SpO2 98%   BMI 27.74 kg/m   BP Readings from Last 3 Encounters:  08/17/17 112/66  08/09/17 (!) 95/42  07/10/17 102/64    Wt Readings from Last 3 Encounters:  08/17/17 146 lb 12.8 oz (66.6 kg)  08/09/17 143 lb 4.8 oz (65 kg)  07/10/17 149 lb 12.8 oz (67.9 kg)    General appearance: alert, ill appearing.  cooperative and appears stated age Ears:right TM erythematous TM,  noral left TM Throat: lips, mucosa, and tongue normal; teeth and gums normal Neck: no adenopathy, no carotid bruit, supple, symmetrical, trachea midline and thyroid not enlarged, symmetric, no tenderness/mass/nodules Back: symmetric, no curvature. ROM  normal. No CVA tenderness. Lungs: clear to auscultation bilaterally Heart: tachycardic ,  Regular,  Loud 3/6 systolic murmur best at LUSB Chest wall: tender over pacer site,  Healing ecchymoses,   Post op incision clean and not draining.  Abdomen: soft, non-tender; bowel sounds normal; no masses,  no organomegaly Pulses: 2+ and symmetric Skin: Skin color, texture, turgor normal. No rashes or lesions Lymph nodes: Cervical LAD noted,   supraclavicular, and axillary nodes normal.   Assessment & Plan:   Problem List Items Addressed This Visit    Tachycardia - Primary    Unclear if her current tachycardia is secondary  to acute illness or underlying cardiac disease.  Given that she is symptomatic (dyspneic) and s/p pacemaker implant on Feb 2 for CHB, will check chest x ray,  start metoprolol xl 25 mg and have her follow up with cardiologist ASAP .   EKG shows a paced rhythm  at 98 bpm today       Relevant Orders   EKG 12-Lead (Completed)   Otitis media    Left ear.  Augmenitn,  Afrin.  Avoiding prednisone given post op status from pacer implant Feb 1       Relevant Medications   amoxicillin-clavulanate (AUGMENTIN) 875-125 MG tablet   CHB (complete heart block) ([facility])    S/p pacemaker implant Feb 1 at [facility].  EKG done today showing paced rhythm at 98 bpm .  Will forward to her cardiologist       Relevant Medications   metoprolol succinate (TOPROL-XL) 25 MG 24 hr tablet    Other Visit Diagnoses    Shortness of breath       Relevant Orders   DG Chest 2 View   Viral upper respiratory tract infection       Relevant Orders   CBC with Differential/Platelet      I am having Kirkland Hun Burkman start on amoxicillin-clavulanate and metoprolol succinate. I am also having her maintain her glucose blood, glipiZIDE, atorvastatin, levothyroxine, escitalopram, metFORMIN, traZODone, fluticasone, and LORazepam.  Meds ordered this encounter  Medications  . amoxicillin-clavulanate (AUGMENTIN)  875-125 MG tablet    Sig: Take 1 tablet by mouth 2 (two) times daily.    Dispense:  14 tablet    Refill:  0  . metoprolol succinate (TOPROL-XL) 25 MG 24 hr tablet    Sig: Take 1 tablet (25 mg total) by mouth daily.    Dispense:  30 tablet    Refill:  1    There are no discontinued medications.  Follow-up: No Follow-up on file.   [name], MD

## 2017-08-21 ENCOUNTER — Ambulatory Visit: Payer: Medicare Other | Admitting: Internal Medicine

## 2017-08-21 ENCOUNTER — Telehealth: Payer: Self-pay | Admitting: Internal Medicine

## 2017-08-21 NOTE — Telephone Encounter (Signed)
She does not have Mychart.   I called her advised  her to start the metoprolol .

## 2017-08-21 NOTE — Telephone Encounter (Signed)
Please advise 

## 2017-08-21 NOTE — Telephone Encounter (Signed)
Spoke with pt and she stated that she was told by her Cardiologist not to take the metoprolol. The pt stated that her heart rate went up to 136 today and she was real weak. Pt stated that she has not been checking her sugars lately and is wondering if low blood sugar or high blood sugar could be causing her heart to race. The pt also stated that when the heart goes up she gets really weak and short of breath. Asked the pt if she was having any other symptoms such as arm pain, chest pain, jaw pain and pt stated that she has not. Pt was advised to go to the ED if her symptoms got worse or if she developed any other symptoms.

## 2017-08-21 NOTE — Telephone Encounter (Signed)
Copied from [place]. Topic: Quick Communication - See Telephone Encounter >> Aug 21, 2017  9:31 AM Burnis Medin, NT wrote: CRM for notification. See Telephone encounter for: Patient is calling to see if the doctor or nurse could give her  a call back regarding her metoprolol succinate (TOPROL-XL) 25 MG 24 hr tablet.   08/21/17.

## 2017-08-22 NOTE — Telephone Encounter (Signed)
fyi

## 2017-08-22 NOTE — Telephone Encounter (Signed)
Pt called and said she is going to take the metoprolol.

## 2017-08-24 ENCOUNTER — Inpatient Hospital Stay: Payer: Medicare Other | Admitting: Internal Medicine

## 2017-08-25 DIAGNOSIS — Z950 Presence of cardiac pacemaker: Secondary | ICD-10-CM | POA: Diagnosis not present

## 2017-08-25 DIAGNOSIS — Z45018 Encounter for adjustment and management of other part of cardiac pacemaker: Secondary | ICD-10-CM | POA: Diagnosis not present

## 2017-08-25 DIAGNOSIS — I442 Atrioventricular block, complete: Secondary | ICD-10-CM | POA: Diagnosis not present

## 2017-09-08 ENCOUNTER — Other Ambulatory Visit: Payer: Self-pay | Admitting: Internal Medicine

## 2017-09-08 ENCOUNTER — Encounter: Payer: Self-pay | Admitting: Internal Medicine

## 2017-09-08 ENCOUNTER — Telehealth: Payer: Self-pay | Admitting: Internal Medicine

## 2017-09-08 ENCOUNTER — Ambulatory Visit (INDEPENDENT_AMBULATORY_CARE_PROVIDER_SITE_OTHER): Payer: Medicare Other | Admitting: Internal Medicine

## 2017-09-08 VITALS — BP 100/62 | HR 86 | Ht 61.0 in | Wt 143.0 lb

## 2017-09-08 DIAGNOSIS — E1165 Type 2 diabetes mellitus with hyperglycemia: Secondary | ICD-10-CM | POA: Diagnosis not present

## 2017-09-08 DIAGNOSIS — E039 Hypothyroidism, unspecified: Secondary | ICD-10-CM

## 2017-09-08 MED ORDER — METFORMIN HCL 1000 MG PO TABS: 1000.0000 mg | ORAL_TABLET | Freq: Two times a day (BID) | ORAL | 3 refills | Status: DC

## 2017-09-08 MED ORDER — SITAGLIPTIN PHOSPHATE 100 MG PO TABS: 100.0000 mg | ORAL_TABLET | Freq: Every day | ORAL | 11 refills | Status: DC

## 2017-09-08 NOTE — Patient Instructions (Addendum)
Please continue: - Metformin 1000 mg 2x a day - Glipizide ER 5 mg in am, before b'fast and before dinner  Please start back on Januvia 100 mg before b'fast.  Please return in 3-4 months with your sugar log.

## 2017-09-08 NOTE — Telephone Encounter (Signed)
sitaGLIPtin (JANUVIA) 100 MG tablet  Patient states she can not afford this medication. It will cost her over 600 dollars.  She would like to know if there is anything else she can take in the place of this that she can afford.

## 2017-09-08 NOTE — Progress Notes (Signed)
Patient ID: Haley Young, female   DOB: 04/22/1951, 67 y.o.   MRN: [id number]  HPI: Haley Young is a 67 y.o.-year-old female, returning for f/u for DM2, dx 2010, non-insulin-dependent, uncontrolled, with complications (CKD). Last visit 7.5 mo ago.  She was recently admitted 07/2017 at [facility] for heart block + SOB +   She had a pacemaker implanted. She still not feeling back to baseline.  Still has some shortness of breath.  Sugars have been higher.  Last hemoglobin A1c: 08/10/2017: HbA1c 7.3% 05/11/2017: HbA1c calculated from fructosamine is 6.4%. 09/26/2016: HbA1c calculated from fructosamine is slightly higher, at 7.0%. 04/26/2016: HbA1c calculated from fructosamine is 6.38%. 01/25/2016: HbA1c calculated from fructosamine is 6.35% 08/21/2015: HbA1c calculated from fructosamine is 6.88%  Lab Results  Component Value Date   HGBA1C 7.0 (H) 05/11/2017   HGBA1C 9.1 08/21/2015   HGBA1C 7.5 (H) 03/12/2015  12/18/2014: HbA1c calculated from fructosamine is 6.88% 09/10/2014: HbA1c calculated from fructosamine is 6.9% 03/19/2014:  HbA1c calculated from fructosamine is 6.57%  Pt is on a regimen of: - Metformin 1000 2x a day - Glipizide ER 5 mg 2x a day If you have a larger meal or you have dessert, take 1 of the Glipizide before that meal >> Did not use She was on Januvia and Alogliptin >> too expensive She was on Jardiance 25 mg daily >> started 01/2016 >> however, potassium increased >> stopped (but also costly) We started Glipizide as we had to stop Invokana >> tremors >> stopped She was on Invokana 300 mg daily >> stopped 2/2 high potassium She was on Invokana 300 mg daily - added 08/2013  - costs her 300$ >> at last visit I gave her a coupon for InvokaMet 2 tablets in am ([id number] mg) daily in am and continued Metformin 1000 mg with dinner. She tells she can get the 3 mo supply for 207%$ Tried insulin Lantus and Novolog - ~2011 Tried Cinnamon and vinegar.  Pt checks sugars 2x a day: - am:   114-176, 189 >> 99-136 >> 87-132, 144 >> 111-180, mostly 120s - 2h after b'fast: n/c >> 131-141 >> n/c  - before lunch:56, 70 (10 mg Glipizide),123-144 >> 140-160 - 2h after lunch:  131-164, 178 >> 116-166. 172 >> n/c - before dinner: 157, 161 >> 133-139 >> 142 >> n/c - 2h after dinner: 126-178, 182 >> 126-167, 174 >> 115-130 - bedtime: 146-174, 181 >> 124-177, 181, 197 >> 124-161, 174 >> n/c - nighttime:  129-160 >> n/c  Pt's meals are: - Breakfast: banana + PB or cereals; coffee + sweetener. - Lunch: yoghurt + peaches, ham sandwich; soup - Dinner: baked chicken; peas; salad She exercises 2-3x a week by walking - 30 min.   -+ Decrease in GFR recently, last BUN/creatinine:  08/11/2017: Glucose 136, BUN 19/creatinine 0.8, GFR >60 Lab Results  Component Value Date   BUN 26 (H) 08/09/2017   CREATININE 1.18 (H) 08/09/2017   Lab Results  Component Value Date   MICRALBCREAT 2.3 05/17/2017   MICRALBCREAT 5.3 04/14/2016   MICRALBCREAT 1.3 03/12/2015   MICRALBCREAT 1.5 09/09/2014   MICRALBCREAT 2.0 09/13/2013   MICRALBCREAT 1.2 06/28/2012   MICRALBCREAT 2.7 03/29/2012   05/10/2017: GFR 41 Lab Results  Component Value Date   GFRNONAA 46 (L) 08/09/2017   GFRNONAA 46 (L) 10/18/2016   GFRNONAA 56 (L) 04/26/2016   GFRNONAA 60 01/09/2015   GFRNONAA >60 07/27/2011   GFRNONAA >60 07/26/2011   GFRNONAA >60 07/25/2011   GFRNONAA 91 08/13/2008   -+  HL; last set of lipids: Lab Results  Component Value Date   CHOL 239 (H) 04/14/2016   HDL 57.90 04/14/2016   LDLCALC 149 (H) 04/14/2016   LDLDIRECT 165.0 04/14/2016   TRIG 162.0 (H) 04/14/2016   CHOLHDL 4 04/14/2016  On Lipitor. - last eye exam was in 02/2017: No DR -No numbness and tingling in her feet.  She sees cardiology at [facility] (Dr. Leonides Schanz). She had a catheterization >> no blockages. 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  She also has hypothyroidism.  At last  visit, TSH was slightly high as she missed some doses before the test.  However, she had a TSH checked since then and this was normal: 08/10/2017: TSH 4.54 (0.34-5.66) Lab Results  Component Value Date   TSH 6.36 (H) 05/10/2017   TSH 1.98 04/14/2016   TSH 0.37 01/25/2016   TSH 2.38 08/21/2015   TSH 6.32 (H) 03/12/2015   TSH 10.17 (H) 12/16/2014   TSH 7.08 (H) 09/09/2014   FREET4 1.71 (H) 01/25/2016   FREET4 1.19 08/21/2015   FREET4 0.86 03/12/2015   FREET4 0.71 12/16/2014   FREET4 0.75 03/05/2010   FREET4 0.7 12/08/2009   Pt is on levothyroxine 125 mcg daily, taken: - in am - fasting - at least 30 min from b'fast - no Ca, Fe, MVI, PPIs - not on Biotin  ROS: Constitutional: no weight gain/no weight loss, no fatigue, no subjective hyperthermia, no subjective hypothermia Eyes: no blurry vision, no xerophthalmia ENT: no sore throat, no nodules palpated in throat, no dysphagia, no odynophagia, no hoarseness Cardiovascular: no CP/no SOB/no palpitations/no leg swelling Respiratory: + cough/no SOB/no wheezing Gastrointestinal: no N/no V/no D/no C/no acid reflux Musculoskeletal: no muscle aches/no joint aches Skin: no rashes, no hair loss Neurological: no tremors/no numbness/no tingling/no dizziness  I reviewed pt's medications, allergies, PMH, social hx, family hx, and changes were documented in the history of present illness. Otherwise, unchanged from my initial visit note.  PE: BP 100/62   Pulse 86   Ht 5\' 1"  (1.549 m)   Wt 143 lb (64.9 kg)   SpO2 94%   BMI 27.02 kg/m  Body mass index is 27.02 kg/m.  Wt Readings from Last 3 Encounters:  09/08/17 143 lb (64.9 kg)  08/17/17 146 lb 12.8 oz (66.6 kg)  08/09/17 143 lb 4.8 oz (65 kg)   Constitutional: overweight, in NAD Eyes: PERRLA, EOMI, no exophthalmos ENT: moist mucous membranes, no thyromegaly, no cervical lymphadenopathy Cardiovascular: RRR, no rubs or gallops,+ 3/6 SEM Respiratory: CTA B Gastrointestinal: abdomen  soft, NT, ND, BS+ Musculoskeletal: no deformities, strength intact in all 4 Skin: moist, warm, no rashes Neurological: no tremor with outstretched hands, DTR normal in all 4  ASSESSMENT: 1. DM2, non-insulin-dependent, uncontrolled, without complications  She has been on Invokana 300 in the past, however she had a potassium of 5.8 >> Invokana was stopped. We have tried glipizide after stopping Invokana, however, she developed lows and we had to stop it.   2.  Hypothyroidism   PLAN:  1. Patient with previously fairly well-controlled diabetes, on oral antidiabetic regimen with metformin and glipizide, now with higher blood sugars due to stress.  She has been in the hospital and had a pacemaker implanted.  Her sugars stayed high, especially in the morning and they improved during the day.  She increase her glipizide since last visit to twice a day.  However, sugars are still  high in the morning and we discussed about adding back a DPP 4 inhibitor, which she used in the past with good results.  She agrees to try this.  I am sure that we can de-escalate the regimen when her.  The stress is over.  We did try Jardiance and Invokana in the past but this caused hyperkalemia.With - I advised her to: Patient Instructions  Please continue: - Metformin 1000 mg 2x a day - Glipizide ER 5 mg in am, before b'fast and before dinner  Please start back on Januvia 100 mg before b'fast.  Please return in 3-4 months with your sugar log.    - Reviewed recent HbA1c, and this was slightly higher, at 7.3% we will not check a fructosamine level today, but I plan to do so at next visit.  - continue checking sugars at different times of the day - check 1x a day, rotating checks - advised for yearly eye exams >> she is UTD - Return to clinic in 3 mo with sugar log    2.  Hypothyroidism  - latest thyroid labs reviewed with pt >> slightly high, 4.5 months ago, but at that time, she skipped a few doses.  Since then, she  had another TSH at the end of January, and this was normal. - she continues on LT4 125 mcg daily - pt feels good on this dose. - we discussed about taking the thyroid hormone every day, with water, >30 minutes before breakfast, separated by >4 hours from acid reflux medications, calcium, iron, multivitamins. Pt. is taking it correctly.  Philemon Kingdom, MD PhD [facility] Endocrinology

## 2017-09-11 NOTE — Telephone Encounter (Signed)
LMTCB

## 2017-09-11 NOTE — Telephone Encounter (Signed)
Please advise on below  

## 2017-09-11 NOTE — Telephone Encounter (Signed)
We can try to stay on the same regimen >> she needs to watch her diet: mostly veggies and fruit, low in fat and low in concentrated sweets and prepackaged foods. We can also try to have her take 1.5 tab of Glipizide XL in am.

## 2017-09-12 DIAGNOSIS — Q221 Congenital pulmonary valve stenosis: Secondary | ICD-10-CM | POA: Diagnosis not present

## 2017-09-12 DIAGNOSIS — I442 Atrioventricular block, complete: Secondary | ICD-10-CM | POA: Diagnosis not present

## 2017-09-12 DIAGNOSIS — I10 Essential (primary) hypertension: Secondary | ICD-10-CM | POA: Diagnosis not present

## 2017-09-12 DIAGNOSIS — I471 Supraventricular tachycardia: Secondary | ICD-10-CM | POA: Diagnosis not present

## 2017-09-13 NOTE — Telephone Encounter (Signed)
Called pt. No answer °

## 2017-09-14 ENCOUNTER — Encounter: Payer: Self-pay | Admitting: Internal Medicine

## 2017-09-14 ENCOUNTER — Telehealth: Payer: Self-pay | Admitting: Internal Medicine

## 2017-09-14 NOTE — Telephone Encounter (Signed)
Please see previous telephone note on 03/01

## 2017-09-14 NOTE — Telephone Encounter (Signed)
Spoke to patient. Gave instructions to patient per Dr. Arman Filter note on 09/11/17. Patient verbalized understanding.

## 2017-09-14 NOTE — Telephone Encounter (Signed)
Patient stated that she received a call from the nurse and she is returning the call.  Patient states she is needing a different medication for Blood sugar.    Please advise

## 2017-09-14 NOTE — Telephone Encounter (Signed)
Patient requests that Dr. Cruzita Lederer or her Nurse send the patient a list of foods that she should be eating on patient's MyChart. Patient would like the foods she should eat in writing so that can reference without any confusion.

## 2017-09-15 ENCOUNTER — Other Ambulatory Visit: Payer: Self-pay | Admitting: Internal Medicine

## 2017-09-15 DIAGNOSIS — E1165 Type 2 diabetes mellitus with hyperglycemia: Secondary | ICD-10-CM

## 2017-09-15 NOTE — Telephone Encounter (Signed)
Spoke to patient. Patient states she is very confused and concerned about what she should be eating. She says she understands Dr. Arman Filter previous recommendation but would like to discuss meals and b/s readings further.Pt willing to be referred to dietician.

## 2017-09-15 NOTE — Telephone Encounter (Signed)
I referred her to nutrition - to Mickel Baas.

## 2017-09-15 NOTE — Telephone Encounter (Signed)
Great. Called pt left vmail.

## 2017-10-06 DIAGNOSIS — Q221 Congenital pulmonary valve stenosis: Secondary | ICD-10-CM | POA: Diagnosis not present

## 2017-10-06 DIAGNOSIS — I471 Supraventricular tachycardia: Secondary | ICD-10-CM | POA: Diagnosis not present

## 2017-10-06 DIAGNOSIS — I10 Essential (primary) hypertension: Secondary | ICD-10-CM | POA: Diagnosis not present

## 2017-10-06 DIAGNOSIS — I442 Atrioventricular block, complete: Secondary | ICD-10-CM | POA: Diagnosis not present

## 2017-10-30 ENCOUNTER — Other Ambulatory Visit: Payer: Self-pay | Admitting: Internal Medicine

## 2017-11-01 ENCOUNTER — Other Ambulatory Visit: Payer: Self-pay | Admitting: Internal Medicine

## 2017-11-02 DIAGNOSIS — I10 Essential (primary) hypertension: Secondary | ICD-10-CM | POA: Diagnosis not present

## 2017-11-02 DIAGNOSIS — R0602 Shortness of breath: Secondary | ICD-10-CM | POA: Diagnosis not present

## 2017-11-02 DIAGNOSIS — I471 Supraventricular tachycardia: Secondary | ICD-10-CM | POA: Diagnosis not present

## 2017-11-02 DIAGNOSIS — I442 Atrioventricular block, complete: Secondary | ICD-10-CM | POA: Diagnosis not present

## 2017-11-03 DIAGNOSIS — Z950 Presence of cardiac pacemaker: Secondary | ICD-10-CM | POA: Diagnosis not present

## 2017-11-03 DIAGNOSIS — I442 Atrioventricular block, complete: Secondary | ICD-10-CM | POA: Diagnosis not present

## 2017-11-03 DIAGNOSIS — Z45018 Encounter for adjustment and management of other part of cardiac pacemaker: Secondary | ICD-10-CM | POA: Diagnosis not present

## 2017-11-10 DIAGNOSIS — Z954 Presence of other heart-valve replacement: Secondary | ICD-10-CM | POA: Insufficient documentation

## 2017-11-20 ENCOUNTER — Telehealth: Payer: Self-pay | Admitting: Internal Medicine

## 2017-11-20 DIAGNOSIS — F411 Generalized anxiety disorder: Secondary | ICD-10-CM

## 2017-11-20 MED ORDER — FLUOXETINE HCL 10 MG PO TABS: 10.0000 mg | ORAL_TABLET | Freq: Every day | ORAL | 0 refills | Status: DC

## 2017-11-20 NOTE — Telephone Encounter (Signed)
Prozac:  Refilled: 10/26/2016 Last OV: 08/17/2017 Next OV: not scheduled  Medication is in pt's historical med list.

## 2017-11-20 NOTE — Telephone Encounter (Signed)
Ok to resume  the prozac but needs to start at a much lower dose to avoid nausea. 10 gmg dose sent to [facility],  May increaase to 20 mg after one week

## 2017-11-20 NOTE — Telephone Encounter (Signed)
Copied from [place] [id number]. Topic: Quick Communication - Rx Refill/Question >> Nov 20, 2017  7:27 AM Lennox Solders wrote: Medication: prozac Has the patient contacted their pharmacy? {no (Agent: If no, request that the patient contact the pharmacy for the refill.) Preferred Pharmacy (with phone number or street name): [facility] [address] in [place]. Pt is having heart surgery on 12-05-17. Pt has been snappy at people

## 2017-11-21 NOTE — Telephone Encounter (Signed)
LMTCB. PEC may speak with pt.  

## 2017-11-22 NOTE — Telephone Encounter (Signed)
Pt informed of Dr. Lupita Dawn notes, call pt if needed

## 2017-11-22 NOTE — Telephone Encounter (Signed)
FYI

## 2017-12-01 DIAGNOSIS — E02 Subclinical iodine-deficiency hypothyroidism: Secondary | ICD-10-CM | POA: Diagnosis not present

## 2017-12-01 DIAGNOSIS — F418 Other specified anxiety disorders: Secondary | ICD-10-CM | POA: Diagnosis not present

## 2017-12-01 DIAGNOSIS — I442 Atrioventricular block, complete: Secondary | ICD-10-CM | POA: Diagnosis not present

## 2017-12-01 DIAGNOSIS — I471 Supraventricular tachycardia: Secondary | ICD-10-CM | POA: Diagnosis not present

## 2017-12-01 DIAGNOSIS — I371 Nonrheumatic pulmonary valve insufficiency: Secondary | ICD-10-CM | POA: Diagnosis not present

## 2017-12-01 DIAGNOSIS — Q221 Congenital pulmonary valve stenosis: Secondary | ICD-10-CM | POA: Diagnosis not present

## 2017-12-01 DIAGNOSIS — K219 Gastro-esophageal reflux disease without esophagitis: Secondary | ICD-10-CM | POA: Diagnosis not present

## 2017-12-01 DIAGNOSIS — I10 Essential (primary) hypertension: Secondary | ICD-10-CM | POA: Diagnosis not present

## 2017-12-01 DIAGNOSIS — I5020 Unspecified systolic (congestive) heart failure: Secondary | ICD-10-CM | POA: Diagnosis not present

## 2017-12-01 DIAGNOSIS — E0800 Diabetes mellitus due to underlying condition with hyperosmolarity without nonketotic hyperglycemic-hyperosmolar coma (NKHHC): Secondary | ICD-10-CM | POA: Diagnosis not present

## 2017-12-05 DIAGNOSIS — Z950 Presence of cardiac pacemaker: Secondary | ICD-10-CM | POA: Diagnosis not present

## 2017-12-05 DIAGNOSIS — I130 Hypertensive heart and chronic kidney disease with heart failure and stage 1 through stage 4 chronic kidney disease, or unspecified chronic kidney disease: Secondary | ICD-10-CM | POA: Diagnosis present

## 2017-12-05 DIAGNOSIS — E039 Hypothyroidism, unspecified: Secondary | ICD-10-CM | POA: Diagnosis not present

## 2017-12-05 DIAGNOSIS — R918 Other nonspecific abnormal finding of lung field: Secondary | ICD-10-CM | POA: Diagnosis not present

## 2017-12-05 DIAGNOSIS — K219 Gastro-esophageal reflux disease without esophagitis: Secondary | ICD-10-CM | POA: Diagnosis present

## 2017-12-05 DIAGNOSIS — E119 Type 2 diabetes mellitus without complications: Secondary | ICD-10-CM | POA: Diagnosis not present

## 2017-12-05 DIAGNOSIS — R768 Other specified abnormal immunological findings in serum: Secondary | ICD-10-CM | POA: Diagnosis not present

## 2017-12-05 DIAGNOSIS — Q256 Stenosis of pulmonary artery: Secondary | ICD-10-CM | POA: Diagnosis not present

## 2017-12-05 DIAGNOSIS — Z7984 Long term (current) use of oral hypoglycemic drugs: Secondary | ICD-10-CM | POA: Diagnosis not present

## 2017-12-05 DIAGNOSIS — E785 Hyperlipidemia, unspecified: Secondary | ICD-10-CM | POA: Diagnosis present

## 2017-12-05 DIAGNOSIS — I509 Heart failure, unspecified: Secondary | ICD-10-CM | POA: Diagnosis present

## 2017-12-05 DIAGNOSIS — J984 Other disorders of lung: Secondary | ICD-10-CM | POA: Diagnosis present

## 2017-12-05 DIAGNOSIS — F418 Other specified anxiety disorders: Secondary | ICD-10-CM | POA: Diagnosis present

## 2017-12-05 DIAGNOSIS — I370 Nonrheumatic pulmonary valve stenosis: Secondary | ICD-10-CM | POA: Diagnosis not present

## 2017-12-05 DIAGNOSIS — Z952 Presence of prosthetic heart valve: Secondary | ICD-10-CM | POA: Diagnosis not present

## 2017-12-05 DIAGNOSIS — N189 Chronic kidney disease, unspecified: Secondary | ICD-10-CM | POA: Diagnosis not present

## 2017-12-05 DIAGNOSIS — J45909 Unspecified asthma, uncomplicated: Secondary | ICD-10-CM | POA: Diagnosis not present

## 2017-12-05 DIAGNOSIS — I361 Nonrheumatic tricuspid (valve) insufficiency: Secondary | ICD-10-CM | POA: Diagnosis not present

## 2017-12-05 DIAGNOSIS — I340 Nonrheumatic mitral (valve) insufficiency: Secondary | ICD-10-CM | POA: Diagnosis not present

## 2017-12-05 DIAGNOSIS — E1122 Type 2 diabetes mellitus with diabetic chronic kidney disease: Secondary | ICD-10-CM | POA: Diagnosis present

## 2017-12-05 DIAGNOSIS — I2510 Atherosclerotic heart disease of native coronary artery without angina pectoris: Secondary | ICD-10-CM | POA: Diagnosis not present

## 2017-12-05 DIAGNOSIS — I350 Nonrheumatic aortic (valve) stenosis: Secondary | ICD-10-CM | POA: Diagnosis not present

## 2017-12-05 DIAGNOSIS — Q221 Congenital pulmonary valve stenosis: Secondary | ICD-10-CM | POA: Diagnosis not present

## 2017-12-05 DIAGNOSIS — N182 Chronic kidney disease, stage 2 (mild): Secondary | ICD-10-CM | POA: Diagnosis present

## 2017-12-06 DIAGNOSIS — I2510 Atherosclerotic heart disease of native coronary artery without angina pectoris: Secondary | ICD-10-CM | POA: Diagnosis not present

## 2017-12-06 DIAGNOSIS — E119 Type 2 diabetes mellitus without complications: Secondary | ICD-10-CM | POA: Diagnosis not present

## 2017-12-06 DIAGNOSIS — Z950 Presence of cardiac pacemaker: Secondary | ICD-10-CM | POA: Diagnosis not present

## 2017-12-06 DIAGNOSIS — I361 Nonrheumatic tricuspid (valve) insufficiency: Secondary | ICD-10-CM | POA: Diagnosis not present

## 2017-12-06 DIAGNOSIS — N189 Chronic kidney disease, unspecified: Secondary | ICD-10-CM | POA: Diagnosis not present

## 2017-12-06 DIAGNOSIS — I370 Nonrheumatic pulmonary valve stenosis: Secondary | ICD-10-CM | POA: Diagnosis not present

## 2017-12-06 DIAGNOSIS — I340 Nonrheumatic mitral (valve) insufficiency: Secondary | ICD-10-CM | POA: Diagnosis not present

## 2017-12-06 DIAGNOSIS — R918 Other nonspecific abnormal finding of lung field: Secondary | ICD-10-CM | POA: Diagnosis not present

## 2017-12-06 DIAGNOSIS — I350 Nonrheumatic aortic (valve) stenosis: Secondary | ICD-10-CM | POA: Diagnosis not present

## 2017-12-06 MED ORDER — DEXTROSE 50 % IV SOLN
12.50 | INTRAVENOUS | Status: DC
Start: ? — End: 2017-12-06

## 2017-12-06 MED ORDER — GENERIC EXTERNAL MEDICATION
12.50 | Status: DC
Start: 2017-12-06 — End: 2017-12-06

## 2017-12-06 MED ORDER — GENERIC EXTERNAL MEDICATION
1.00 | Status: DC
Start: ? — End: 2017-12-06

## 2017-12-06 MED ORDER — ACETAMINOPHEN 325 MG PO TABS
650.00 | ORAL_TABLET | ORAL | Status: DC
Start: ? — End: 2017-12-06

## 2017-12-06 MED ORDER — ASPIRIN 81 MG PO CHEW
81.00 | CHEWABLE_TABLET | ORAL | Status: DC
Start: 2017-12-07 — End: 2017-12-06

## 2017-12-06 MED ORDER — MELATONIN 3 MG PO TABS
3.00 | ORAL_TABLET | ORAL | Status: DC
Start: ? — End: 2017-12-06

## 2017-12-06 MED ORDER — ESCITALOPRAM OXALATE 10 MG PO TABS
5.00 | ORAL_TABLET | ORAL | Status: DC
Start: 2017-12-06 — End: 2017-12-06

## 2017-12-06 MED ORDER — INSULIN REGULAR HUMAN 100 UNIT/ML IJ SOLN
0.00 | INTRAMUSCULAR | Status: DC
Start: 2017-12-06 — End: 2017-12-06

## 2017-12-06 MED ORDER — GENERIC EXTERNAL MEDICATION
125.00 | Status: DC
Start: 2017-12-06 — End: 2017-12-06

## 2017-12-06 MED ORDER — ATORVASTATIN CALCIUM 40 MG PO TABS
40.00 | ORAL_TABLET | ORAL | Status: DC
Start: ? — End: 2017-12-06

## 2017-12-06 MED ORDER — TRAZODONE HCL 50 MG PO TABS
150.00 | ORAL_TABLET | ORAL | Status: DC
Start: 2017-12-06 — End: 2017-12-06

## 2017-12-06 MED ORDER — LORAZEPAM 1 MG PO TABS
1.00 | ORAL_TABLET | ORAL | Status: DC
Start: 2017-12-06 — End: 2017-12-06

## 2017-12-13 ENCOUNTER — Ambulatory Visit: Payer: Medicare Other | Admitting: Internal Medicine

## 2017-12-13 NOTE — Progress Notes (Deleted)
Patient ID: SHONTAVIA MICKEL, female   DOB: 1950/09/11, 67 y.o.   MRN: [id number]  HPI: MAKALEY STORTS is a 67 y.o.-year-old female, returning for f/u for DM2, dx 2010, non-insulin-dependent, uncontrolled, with complications (CKD). Last visit 3 mo ago.  Since last visit, she was in the hospital on 12/05/2017.  DM2: Last hemoglobin A1c: 08/10/2017: HbA1c 7.3% 05/11/2017: HbA1c calculated from fructosamine is 6.4%. 09/26/2016: HbA1c calculated from fructosamine is slightly higher, at 7.0%. 04/26/2016: HbA1c calculated from fructosamine is 6.38%. 01/25/2016: HbA1c calculated from fructosamine is 6.35% 08/21/2015: HbA1c calculated from fructosamine is 6.88%  Lab Results  Component Value Date   HGBA1C 7.0 (H) 05/11/2017   HGBA1C 9.1 08/21/2015   HGBA1C 7.5 (H) 03/12/2015  12/18/2014: HbA1c calculated from fructosamine is 6.88% 09/10/2014: HbA1c calculated from fructosamine is 6.9% 03/19/2014:  HbA1c calculated from fructosamine is 6.57%  Pt is on a regimen of: - Metformin 1000 2x a day - Glipizide ER 7.5 mg before breakfast and 5 mg before dinner If you have a larger meal or you have dessert, take 1 of the Glipizide before that meal >> Did not use She was on Januvia and Alogliptin >> too expensive; again we tried Januvia in 2019 >> still expensive She was on Jardiance 25 mg daily >> started 01/2016 >> however, potassium increased >> stopped (but also costly) We started Glipizide as we had to stop Invokana >> tremors >> stopped She was on Invokana 300 mg daily >> stopped 2/2 high potassium She was on Invokana 300 mg daily - added 08/2013  - costs her 300$ >> at last visit I gave her a coupon for InvokaMet 2 tablets in am ([phone] mg) daily in am and continued Metformin 1000 mg with dinner. She tells she can get the 3 mo supply for 207%$ Tried insulin Lantus and Novolog - ~2011 Tried Cinnamon and vinegar.  Pt checks sugars 2x a day: - am:   87-132, 144 >> 111-180, mostly 120s - 2h after b'fast:  n/c >> 131-141 >> n/c  - before lunch:56, 70 (10 mg Glipizide),123-144 >> 140-160 - 2h after lunch:  131-164, 178 >> 116-166. 172 >> n/c - before dinner: 157, 161 >> 133-139 >> 142 >> n/c - 2h after dinner: 126-178, 182 >> 126-167, 174 >> 115-130 - bedtime: 1 124-177, 181, 197 >> 124-161, 174 >> n/c - nighttime:  129-160 >> n/c  Pt's meals are: - Breakfast: banana + PB or cereals; coffee + sweetener. - Lunch: yoghurt + peaches, ham sandwich; soup - Dinner: baked chicken; peas; salad She exercises 2-3 times a week by walking 30 minutes.  = + low CKD recently,  last BUN/creatinine:   08/11/2017: Glucose 136, BUN 19/creatinine 0.8, GFR >60 Lab Results  Component Value Date   BUN 26 (H) 08/09/2017   CREATININE 1.18 (H) 08/09/2017   Lab Results  Component Value Date   MICRALBCREAT 2.3 05/17/2017   MICRALBCREAT 5.3 04/14/2016   MICRALBCREAT 1.3 03/12/2015   MICRALBCREAT 1.5 09/09/2014   MICRALBCREAT 2.0 09/13/2013   MICRALBCREAT 1.2 06/28/2012   MICRALBCREAT 2.7 03/29/2012   05/10/2017: GFR 41 Lab Results  Component Value Date   GFRNONAA 46 (L) 08/09/2017   GFRNONAA 46 (L) 10/18/2016   GFRNONAA 56 (L) 04/26/2016   GFRNONAA 60 01/09/2015   GFRNONAA >60 07/27/2011   GFRNONAA >60 07/26/2011   GFRNONAA >60 07/25/2011   GFRNONAA 91 08/13/2008   -+ HL; last set of lipids: Lab Results  Component Value Date   CHOL 239 (H) 04/14/2016  HDL 57.90 04/14/2016   LDLCALC 149 (H) 04/14/2016   LDLDIRECT 165.0 04/14/2016   TRIG 162.0 (H) 04/14/2016   CHOLHDL 4 04/14/2016  On Lipitor. - last eye exam was in 08 02/2017: No DR - no numbness and tingling in her feet.  She sees cardiology at [facility] (Dr. Leonides Schanz). She had a catheterization >> no blockages. 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  Hypothyroidism:   In 04/2017, TSH was higher as she was missing some doses of levothyroxine.  Since then, this was rechecked and  it was normal: 08/10/2017: TSH 4.54 (0.34-5.66) Lab Results  Component Value Date   TSH 6.36 (H) 05/10/2017   TSH 1.98 04/14/2016   TSH 0.37 01/25/2016   TSH 2.38 08/21/2015   TSH 6.32 (H) 03/12/2015   TSH 10.17 (H) 12/16/2014   TSH 7.08 (H) 09/09/2014   FREET4 1.71 (H) 01/25/2016   FREET4 1.19 08/21/2015   FREET4 0.86 03/12/2015   FREET4 0.71 12/16/2014   FREET4 0.75 03/05/2010   FREET4 0.7 12/08/2009   Pt is on levothyroxine 125 mcg daily, taken: - in am - fasting - at least 30 min from b'fast - no Ca, Fe, MVI, PPIs - not on Biotin  ROS: Constitutional: no weight gain/no weight loss, no fatigue, no subjective hyperthermia, no subjective hypothermia Eyes: no blurry vision, no xerophthalmia ENT: no sore throat, no nodules palpated in throat, no dysphagia, no odynophagia, no hoarseness Cardiovascular: no CP/no SOB/no palpitations/no leg swelling Respiratory: no cough/no SOB/no wheezing Gastrointestinal: no N/no V/no D/no C/no acid reflux Musculoskeletal: no muscle aches/no joint aches Skin: no rashes, no hair loss Neurological: no tremors/no numbness/no tingling/no dizziness  I reviewed pt's medications, allergies, PMH, social hx, family hx, and changes were documented in the history of present illness. Otherwise, unchanged from my initial visit note.  PE: There were no vitals taken for this visit. There is no height or weight on file to calculate BMI.  Wt Readings from Last 3 Encounters:  09/08/17 143 lb (64.9 kg)  08/17/17 146 lb 12.8 oz (66.6 kg)  08/09/17 143 lb 4.8 oz (65 kg)   Constitutional: overweight, in NAD Eyes: PERRLA, EOMI, no exophthalmos ENT: moist mucous membranes, no thyromegaly, no cervical lymphadenopathy Cardiovascular: RRR, No RG, + 3/6 SEM Respiratory: CTA B Gastrointestinal: abdomen soft, NT, ND, BS+ Musculoskeletal: no deformities, strength intact in all 4 Skin: moist, warm, no rashes Neurological: no tremor with outstretched hands, DTR  normal in all 4  ASSESSMENT: 1. DM2, non-insulin-dependent, uncontrolled, without complications  She has been on Invokana 300 in the past, however she had a potassium of 5.8 >> Invokana was stopped. We have tried glipizide after stopping Invokana, however, she developed lows and we had to stop it.   2.  Hypothyroidism   PLAN:  1. Patient with previously well-controlled diabetes, on oral antidiabetic regimen with metformin and glipizide, with higher blood sugars at last visit due to stress.  At that time, she was in the hospital and had a pacemaker.  I suggested to add Januvia, but she could not afford it.  In that case, we increased her glipizide in the morning.  We did try Jardiance and Invokana in the past but these caused hyperkalemia.  - I advised her to: Patient Instructions  Please continue: - Metformin 1000 mg 2x a day - Glipizide ER 7.5 mg before b'fast and 5 mg before dinner  Please return in 4  months with your sugar log.   - today, HbA1c is 7%  - continue checking sugars at different times of the day - check 1x a day, rotating checks - advised for yearly eye exams >> she is UTD - Return to clinic in 3 mo with sugar log    2.  Hypothyroidism  - latest thyroid labs reviewed with pt >> slightly higher in 07/2017, but still in the normal range - she continues on LT4 125 mcg daily - pt feels good on this dose. - we discussed about taking the thyroid hormone every day, with water, >30 minutes before breakfast, separated by >4 hours from acid reflux medications, calcium, iron, multivitamins. Pt. is taking it correctly.   Philemon Kingdom, MD PhD [facility]

## 2017-12-18 DIAGNOSIS — Z87898 Personal history of other specified conditions: Secondary | ICD-10-CM | POA: Diagnosis not present

## 2017-12-18 DIAGNOSIS — R0902 Hypoxemia: Secondary | ICD-10-CM | POA: Diagnosis not present

## 2017-12-18 DIAGNOSIS — M25471 Effusion, right ankle: Secondary | ICD-10-CM | POA: Diagnosis not present

## 2017-12-18 DIAGNOSIS — I442 Atrioventricular block, complete: Secondary | ICD-10-CM | POA: Diagnosis not present

## 2017-12-18 DIAGNOSIS — M25472 Effusion, left ankle: Secondary | ICD-10-CM | POA: Diagnosis not present

## 2017-12-18 DIAGNOSIS — Z952 Presence of prosthetic heart valve: Secondary | ICD-10-CM | POA: Diagnosis not present

## 2017-12-18 DIAGNOSIS — J9811 Atelectasis: Secondary | ICD-10-CM | POA: Diagnosis not present

## 2017-12-18 DIAGNOSIS — R0602 Shortness of breath: Secondary | ICD-10-CM | POA: Diagnosis not present

## 2017-12-18 DIAGNOSIS — Z7984 Long term (current) use of oral hypoglycemic drugs: Secondary | ICD-10-CM | POA: Diagnosis not present

## 2017-12-18 DIAGNOSIS — E119 Type 2 diabetes mellitus without complications: Secondary | ICD-10-CM | POA: Diagnosis not present

## 2017-12-18 DIAGNOSIS — I370 Nonrheumatic pulmonary valve stenosis: Secondary | ICD-10-CM | POA: Diagnosis not present

## 2017-12-18 DIAGNOSIS — R9431 Abnormal electrocardiogram [ECG] [EKG]: Secondary | ICD-10-CM | POA: Diagnosis not present

## 2017-12-18 DIAGNOSIS — Z79899 Other long term (current) drug therapy: Secondary | ICD-10-CM | POA: Diagnosis not present

## 2017-12-18 DIAGNOSIS — I10 Essential (primary) hypertension: Secondary | ICD-10-CM | POA: Diagnosis not present

## 2017-12-18 DIAGNOSIS — R918 Other nonspecific abnormal finding of lung field: Secondary | ICD-10-CM | POA: Diagnosis not present

## 2017-12-18 DIAGNOSIS — I5020 Unspecified systolic (congestive) heart failure: Secondary | ICD-10-CM | POA: Diagnosis not present

## 2017-12-18 DIAGNOSIS — I5032 Chronic diastolic (congestive) heart failure: Secondary | ICD-10-CM | POA: Diagnosis not present

## 2017-12-18 DIAGNOSIS — J811 Chronic pulmonary edema: Secondary | ICD-10-CM | POA: Diagnosis not present

## 2017-12-18 DIAGNOSIS — I471 Supraventricular tachycardia: Secondary | ICD-10-CM | POA: Diagnosis not present

## 2017-12-18 DIAGNOSIS — I110 Hypertensive heart disease with heart failure: Secondary | ICD-10-CM | POA: Diagnosis not present

## 2017-12-18 DIAGNOSIS — Z9889 Other specified postprocedural states: Secondary | ICD-10-CM | POA: Diagnosis not present

## 2017-12-18 DIAGNOSIS — Z950 Presence of cardiac pacemaker: Secondary | ICD-10-CM | POA: Diagnosis not present

## 2017-12-18 DIAGNOSIS — Z7982 Long term (current) use of aspirin: Secondary | ICD-10-CM | POA: Diagnosis not present

## 2017-12-20 ENCOUNTER — Telehealth: Payer: Self-pay

## 2017-12-20 NOTE — Telephone Encounter (Signed)
Spoke with pt and she stated that she was in the ED at [facility] yesterday for SOBr. She stated that they could not find out what what causing her to be so short of breath. Pt wanted to know if she could get a referral to Pulmonology or if she would need to come in for an appointment before the referral can be ordered.

## 2017-12-20 NOTE — Telephone Encounter (Signed)
Needs to be seen

## 2017-12-21 NOTE — Telephone Encounter (Signed)
Please advise 

## 2017-12-21 NOTE — Telephone Encounter (Signed)
Patients says she can't wait until 07/09/219 9am to be seen for her pulmonary concenrs. Please call back to notify her whether or not she can be worked in.

## 2017-12-22 NOTE — Telephone Encounter (Signed)
Spoke with pt to let her know that Dr. Derrel Nip is out of the office until Monday and that she does not have any available appts until Wednesday. Pt stated that she sees her cardiologist at [facility] on Tuesday. She stated that she would discuss the issue with them and see if they would get her a referral to a pulmonologist at [facility].

## 2017-12-26 DIAGNOSIS — Q221 Congenital pulmonary valve stenosis: Secondary | ICD-10-CM | POA: Diagnosis not present

## 2017-12-26 DIAGNOSIS — Z950 Presence of cardiac pacemaker: Secondary | ICD-10-CM | POA: Diagnosis not present

## 2017-12-26 DIAGNOSIS — R0602 Shortness of breath: Secondary | ICD-10-CM | POA: Diagnosis not present

## 2017-12-26 DIAGNOSIS — I371 Nonrheumatic pulmonary valve insufficiency: Secondary | ICD-10-CM | POA: Diagnosis not present

## 2017-12-26 DIAGNOSIS — R0609 Other forms of dyspnea: Secondary | ICD-10-CM | POA: Diagnosis not present

## 2017-12-26 DIAGNOSIS — I5023 Acute on chronic systolic (congestive) heart failure: Secondary | ICD-10-CM | POA: Diagnosis not present

## 2017-12-30 ENCOUNTER — Other Ambulatory Visit: Payer: Self-pay | Admitting: Internal Medicine

## 2018-01-01 ENCOUNTER — Telehealth: Payer: Self-pay | Admitting: *Deleted

## 2018-01-01 NOTE — Telephone Encounter (Signed)
Refilled: 05/17/2017 Last OV: 08/17/2017 Next OV: not scheduled

## 2018-01-01 NOTE — Telephone Encounter (Signed)
Copied from [place] [id number]. Topic: Referral - Request >> Jan 01, 2018  7:50 AM Yvette Rack wrote: Reason for CRM: pt would like to speak with Janett Billow about a referral for the SOB that anyone could never find out why she has been to [facility] and they couldn't figure the cause of it also she would like to have it within the [place] system

## 2018-01-01 NOTE — Telephone Encounter (Signed)
Printed, signed and faxed.  

## 2018-01-02 ENCOUNTER — Other Ambulatory Visit: Payer: Self-pay

## 2018-01-02 MED ORDER — LORAZEPAM 1 MG PO TABS: 0.5000 mg | ORAL_TABLET | Freq: Two times a day (BID) | ORAL | 3 refills | Status: DC | PRN

## 2018-01-02 NOTE — Telephone Encounter (Signed)
Spoke with pt and she stated that she called [facility] and they have scheduled her for tomorrow.

## 2018-01-02 NOTE — Progress Notes (Signed)
[facility] Consultation      Assessment and Plan:  Acute on dyspnea of uncertain etiology, with weakness, dizziness. - Discussed with patient and husband, all testing including CT scan and echocardiogram performed at [facility] were reviewed.  I see no evidence of lung disease currently.  I discussed with patient that we could try an empiric inhaler and see if this helps.  Patient was walked in our office, without evidence of desaturations and tachycardia.  Doubtful that she would be able to complete a pulmonary function test given her dyspnea and fatigue. - Patient has an appointment to follow-up next week at [facility] with her surgeon, recommended that she do so.  I am curious as to whether she may respond to a pulmonary vasodilator given her previous pulmonic valve regurgitation which was corrected with valve replacement. - She might benefit from enrollment at cardiac rehab here, but I think it would be prudent for her to be cleared by her surgical team at [facility] first.  Lung nodule. - Sub-solid 8 mm nodule right middle lobe. - Needs repeat follow-up in 6 months to 1 year.  Can order at next visit.   Date: 01/03/2018  MRN# [id number] Haley Young [birth year]   Haley Young is a 67 y.o. old female seen in consultation for chief complaint of:    Chief Complaint  Patient presents with  . 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 She was admitted to [facility] on January 2019 with complete heart block, subsequently had a pacemaker inserted. She had a repeat PVR in May 28,2019; catheter based.   Subjective: Since her May admission to the hospital she has been very winded and short of breath. The replacement was performed at [facility],. She underwent CT chest and  echo were unrevealing and she was started on lasix.  She has told several years ago that she has asthma. No copd, she has never been a smoker, her parents smoked. She did not grow up on a farm. She lived in [place] for 4 years, she has travelled to [place], took a cruise once to [place].  They have no pets, she denies reflux, a bit of sinus drainage.  She has not been on an inhaler recently, she has been tried on albuterol but it makes her shake.  Before the surgery she could load the dishwasher, cook, bathe and dress, walk around a supermarket, but has trouble with those activities now.  She feels that the dyspnea is not improving.   **Desat walk 01/03/18>> at rest on RA sat was 99% and HR 111. Walked 150 feet, sat was 98% HR 123, moderate dyspnea, slow, unsteady gait.  **CT chest [facility] 12/19/17>> negative for PE; enlargement of central pulm arteries; 7mm lung nodule,  **Echo 12/06/17>>RVSP 29; EF55%; trivial TR.  **Chest x-ray 08/17/2017>> imaging personally reviewed, mild scoliosis, mild right mid zone scarring.  Lungs are otherwise unremarkable.  **Cardiac catheterization 12/05/17:>>Procedural Findings:  Catheterization demonstrates:  Severe bioprosthetic pulmonary regurgitation and mild-moderate pulmonic stenosis.  Normal pulmonary artery pressures and biatrial filling pressures with a normal cardiac output.  Mild non-obstructive coronary artery disease.  Successful transcatheter pulmonary valve implantation of a 22 mm Melody valve with an excellent hemodynamic result.   CT chest 12/19/17 @[facility] .  Impression:  1.No evidence of acute pulmonary embolus. Linear, eccentric filling defect within the right middle lobar pulmonary artery is indeterminate and could represent sequela of chronic thrombus or possibly mixing artifact. No other filling defects are identified. 2.Marked enlargement of the central pulmonary arteries noted. A prosthetic pulmonic valve is in place and well seated in the  right ventricular outflow tract. 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 3.A subsolid nodular opacity is seen in the right middle lobe measuring up to 0.8 cm in size, possibly infectious/inflammatory in etiology. Recommend follow-up CT in 3-6 months to document resolution.  The preliminary report (critical or emergent communication) was reviewed prior to this dictation and there are no substantial differences between the preliminary results and the impressions in this final report.    PMHX:   Past Medical History:  Diagnosis Date  . Asthma   . Chicken pox   . Depression   . Diabetes mellitus   . Frequent UTI   . GERD (gastroesophageal reflux disease)   . Heart disease   . Heart murmur   . 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 Hx of colonic polyps   . Hyperhidrosis    especially with hot flashes    . Hyperlipidemia   . Lipoma    R axilla   . MRSA cellulitis    surgical wound infection  . Osteoporosis   . Pyelocystitis    as a child   . Thyroid disease   . Urinary incontinence    Surgical Hx:  Past Surgical History:  Procedure Laterality Date  . ABDOMINAL HYSTERECTOMY  1970  . [place], 2005   pulmonary stenosis with valve replacement, Dr. Evelina Dun  . PARTIAL HYSTERECTOMY  1970  . PULMONARY VEIN STENOSIS REPAIR    . VALVE REPLACEMENT     Family Hx:  Family History  Problem Relation Age of Onset  . Coarctation of the aorta Sister   . Diabetes Mother   . COPD Mother   . Obesity Mother   . Depression Mother   . Pancreatic cancer Mother   . Alcohol abuse Father   . Cirrhosis Father   . Down syndrome Brother   . Pneumonia Brother   . Down syndrome Daughter   . Lupus Daughter   . Ulcerative colitis Daughter    Social Hx:   Social History   Tobacco Use  . Smoking status: Never Smoker  . Smokeless tobacco: Never Used  Substance Use Topics  . Alcohol use: Yes    Comment: occasional wine   .  Drug use: No   Medication:    Current Outpatient Medications:  .  aspirin 81 MG chewable tablet, Chew 81 mg by mouth daily., Disp: , Rfl:  .  atorvastatin (LIPITOR) 20 MG tablet, TAKE 1 TABLET BY MOUTH ONCE DAILY, Disp: 90 tablet, Rfl: 1 .  FLUoxetine (PROZAC) 10 MG tablet, Take 1 tablet (10 mg total) by mouth daily., Disp: 90 tablet, Rfl: 0 .  furosemide (LASIX) 20 MG tablet, Take 20 mg by mouth 2 (two) times daily., Disp: , Rfl:  .  glipiZIDE (GLUCOTROL XL) 5 MG 24 hr tablet, Please take 2 tablets in the morning, 1 tablet in the evening. (Patient taking differently: Take 5 mg by mouth 3 (three) times daily. ), Disp: 270 tablet, Rfl: 3 .  glucose blood (ONE TOUCH ULTRA TEST) test strip, USE ONE STRIP TO CHECK GLUCOSE THREE TIMES DAILY AS DIRECTED E11.65, Disp: 200 each, Rfl:  2 .  levothyroxine (SYNTHROID, LEVOTHROID) 125 MCG tablet, TAKE ONE TABLET BY MOUTH ONCE DAILY, Disp: 90 tablet, Rfl: 3 .  LORazepam (ATIVAN) 1 MG tablet, Take 0.5 tablets (0.5 mg total) by mouth 2 (two) times daily as needed. for anxiety, Disp: 30 tablet, Rfl: 3 .  metFORMIN (GLUCOPHAGE) 1000 MG tablet, Take 1 tablet (1,000 mg total) by mouth 2 (two) times daily with a meal., Disp: 180 tablet, Rfl: 3 .  traZODone (DESYREL) 150 MG tablet, TAKE 1 TABLET BY MOUTH AT BEDTIME, Disp: 90 tablet, Rfl: 1   Allergies:  Oxycodone hcl; Albuterol; and Oxycodone hcl  Review of Systems: Gen:  Denies  fever, sweats, chills HEENT: Denies blurred vision, double vision. bleeds, sore throat Cvc:  No dizziness, chest pain. Resp:   Denies cough or sputum production. Gi: Denies swallowing difficulty, stomach pain. Gu:  Denies bladder incontinence, burning urine Ext:   No Joint pain, stiffness. Skin: No skin rash,  hives  Endoc:  No polyuria, polydipsia. Psych: No depression, insomnia. Other:  All other systems were reviewed with the patient and were negative other that what is mentioned in the HPI.   Physical Examination:   VS: BP  118/78 (BP Location: Left Arm, Cuff Size: Normal)   Pulse (!) 112   Ht 5\' 1"  (1.549 m)   Wt 131 lb (59.4 kg)   SpO2 99%   BMI 24.75 kg/m   General Appearance: No distress  Neuro:without focal findings,  speech normal,  HEENT: PERRLA, EOM intact.   Pulmonary: normal breath sounds, No wheezing.  CardiovascularNormal S1,S2.  No m/r/g.   Abdomen: Benign, Soft, non-tender. Renal:  No costovertebral tenderness  GU:  No performed at this time. Endoc: No evident thyromegaly, no signs of acromegaly. Skin:   warm, no rashes, no ecchymosis  Extremities: normal, no cyanosis, clubbing.  Other findings:    LABORATORY PANEL:   CBC No results for input(s): WBC, HGB, HCT, PLT in the last 168 hours. ------------------------------------------------------------------------------------------------------------------  Chemistries  No results for input(s): NA, K, CL, CO2, GLUCOSE, BUN, CREATININE, CALCIUM, MG, AST, ALT, ALKPHOS, BILITOT in the last 168 hours.  Invalid input(s): GFRCGP ------------------------------------------------------------------------------------------------------------------  Cardiac Enzymes No results for input(s): TROPONINI in the last 168 hours. ------------------------------------------------------------  RADIOLOGY:  No results found.     Thank  you for the consultation and for allowing [facility] to assist in the care of your patient. Our recommendations are noted above.  Please contact us if we can be of further service.   Marda Stalker, M.D., F.C.C.P.  Board Certified in Internal Medicine, Pulmonary Medicine, [place], and Sleep Medicine.  [facility] Office Number: [phone]   01/03/2018

## 2018-01-03 ENCOUNTER — Ambulatory Visit (INDEPENDENT_AMBULATORY_CARE_PROVIDER_SITE_OTHER): Payer: Medicare Other | Admitting: Internal Medicine

## 2018-01-03 ENCOUNTER — Encounter: Payer: Self-pay | Admitting: Internal Medicine

## 2018-01-03 VITALS — BP 118/78 | HR 112 | Ht 61.0 in | Wt 131.0 lb

## 2018-01-03 DIAGNOSIS — I083 Combined rheumatic disorders of mitral, aortic and tricuspid valves: Secondary | ICD-10-CM | POA: Diagnosis not present

## 2018-01-03 DIAGNOSIS — I371 Nonrheumatic pulmonary valve insufficiency: Secondary | ICD-10-CM | POA: Diagnosis not present

## 2018-01-03 DIAGNOSIS — R600 Localized edema: Secondary | ICD-10-CM | POA: Diagnosis not present

## 2018-01-03 DIAGNOSIS — R0602 Shortness of breath: Secondary | ICD-10-CM | POA: Diagnosis not present

## 2018-01-03 DIAGNOSIS — R911 Solitary pulmonary nodule: Secondary | ICD-10-CM

## 2018-01-03 DIAGNOSIS — I471 Supraventricular tachycardia: Secondary | ICD-10-CM | POA: Diagnosis not present

## 2018-01-03 DIAGNOSIS — Z952 Presence of prosthetic heart valve: Secondary | ICD-10-CM | POA: Diagnosis not present

## 2018-01-03 DIAGNOSIS — R0609 Other forms of dyspnea: Secondary | ICD-10-CM | POA: Diagnosis not present

## 2018-01-03 DIAGNOSIS — Z8774 Personal history of (corrected) congenital malformations of heart and circulatory system: Secondary | ICD-10-CM | POA: Diagnosis not present

## 2018-01-03 DIAGNOSIS — Q221 Congenital pulmonary valve stenosis: Secondary | ICD-10-CM | POA: Diagnosis not present

## 2018-01-03 MED ORDER — UMECLIDINIUM-VILANTEROL 62.5-25 MCG/INH IN AEPB: 1.0000 | INHALATION_SPRAY | Freq: Every day | RESPIRATORY_TRACT | 0 refills | Status: DC

## 2018-01-03 NOTE — Patient Instructions (Signed)
Start Anoro inhaler 1 puff once daily.  Call us if it seems to help and we can give you a prescription.

## 2018-01-05 DIAGNOSIS — I471 Supraventricular tachycardia: Secondary | ICD-10-CM | POA: Diagnosis not present

## 2018-01-05 DIAGNOSIS — Q221 Congenital pulmonary valve stenosis: Secondary | ICD-10-CM | POA: Diagnosis not present

## 2018-01-05 DIAGNOSIS — I2510 Atherosclerotic heart disease of native coronary artery without angina pectoris: Secondary | ICD-10-CM | POA: Diagnosis not present

## 2018-01-05 DIAGNOSIS — Q249 Congenital malformation of heart, unspecified: Secondary | ICD-10-CM | POA: Diagnosis not present

## 2018-01-08 DIAGNOSIS — I110 Hypertensive heart disease with heart failure: Secondary | ICD-10-CM | POA: Diagnosis present

## 2018-01-08 DIAGNOSIS — Z950 Presence of cardiac pacemaker: Secondary | ICD-10-CM | POA: Diagnosis not present

## 2018-01-08 DIAGNOSIS — E1165 Type 2 diabetes mellitus with hyperglycemia: Secondary | ICD-10-CM | POA: Diagnosis not present

## 2018-01-08 DIAGNOSIS — E871 Hypo-osmolality and hyponatremia: Secondary | ICD-10-CM | POA: Diagnosis not present

## 2018-01-08 DIAGNOSIS — E785 Hyperlipidemia, unspecified: Secondary | ICD-10-CM | POA: Diagnosis present

## 2018-01-08 DIAGNOSIS — K6389 Other specified diseases of intestine: Secondary | ICD-10-CM | POA: Diagnosis not present

## 2018-01-08 DIAGNOSIS — Z90710 Acquired absence of both cervix and uterus: Secondary | ICD-10-CM | POA: Diagnosis not present

## 2018-01-08 DIAGNOSIS — Q221 Congenital pulmonary valve stenosis: Secondary | ICD-10-CM | POA: Diagnosis not present

## 2018-01-08 DIAGNOSIS — I442 Atrioventricular block, complete: Secondary | ICD-10-CM | POA: Diagnosis present

## 2018-01-08 DIAGNOSIS — J45909 Unspecified asthma, uncomplicated: Secondary | ICD-10-CM | POA: Diagnosis present

## 2018-01-08 DIAGNOSIS — K219 Gastro-esophageal reflux disease without esophagitis: Secondary | ICD-10-CM | POA: Diagnosis present

## 2018-01-08 DIAGNOSIS — Z954 Presence of other heart-valve replacement: Secondary | ICD-10-CM | POA: Diagnosis not present

## 2018-01-08 DIAGNOSIS — E271 Primary adrenocortical insufficiency: Secondary | ICD-10-CM | POA: Diagnosis not present

## 2018-01-08 DIAGNOSIS — I950 Idiopathic hypotension: Secondary | ICD-10-CM | POA: Diagnosis not present

## 2018-01-08 DIAGNOSIS — E861 Hypovolemia: Secondary | ICD-10-CM | POA: Diagnosis present

## 2018-01-08 DIAGNOSIS — I2510 Atherosclerotic heart disease of native coronary artery without angina pectoris: Secondary | ICD-10-CM | POA: Diagnosis not present

## 2018-01-08 DIAGNOSIS — E118 Type 2 diabetes mellitus with unspecified complications: Secondary | ICD-10-CM | POA: Diagnosis not present

## 2018-01-08 DIAGNOSIS — F418 Other specified anxiety disorders: Secondary | ICD-10-CM | POA: Diagnosis present

## 2018-01-08 DIAGNOSIS — Z9049 Acquired absence of other specified parts of digestive tract: Secondary | ICD-10-CM | POA: Diagnosis not present

## 2018-01-08 DIAGNOSIS — E2740 Unspecified adrenocortical insufficiency: Secondary | ICD-10-CM | POA: Diagnosis not present

## 2018-01-08 DIAGNOSIS — E063 Autoimmune thyroiditis: Secondary | ICD-10-CM | POA: Diagnosis not present

## 2018-01-08 DIAGNOSIS — Q256 Stenosis of pulmonary artery: Secondary | ICD-10-CM | POA: Diagnosis not present

## 2018-01-08 DIAGNOSIS — Z952 Presence of prosthetic heart valve: Secondary | ICD-10-CM | POA: Diagnosis not present

## 2018-01-08 DIAGNOSIS — E039 Hypothyroidism, unspecified: Secondary | ICD-10-CM | POA: Diagnosis present

## 2018-01-08 DIAGNOSIS — E875 Hyperkalemia: Secondary | ICD-10-CM | POA: Diagnosis not present

## 2018-01-08 DIAGNOSIS — E038 Other specified hypothyroidism: Secondary | ICD-10-CM | POA: Diagnosis not present

## 2018-01-08 DIAGNOSIS — I509 Heart failure, unspecified: Secondary | ICD-10-CM | POA: Diagnosis present

## 2018-01-13 MED ORDER — DEXTROSE 50 % IV SOLN
12.50 | INTRAVENOUS | Status: DC
Start: ? — End: 2018-01-13

## 2018-01-13 MED ORDER — GENERIC EXTERNAL MEDICATION
1.00 | Status: DC
Start: ? — End: 2018-01-13

## 2018-01-13 MED ORDER — LORAZEPAM 1 MG PO TABS
1.00 | ORAL_TABLET | ORAL | Status: DC
Start: ? — End: 2018-01-13

## 2018-01-13 MED ORDER — ASPIRIN 81 MG PO CHEW
81.00 | CHEWABLE_TABLET | ORAL | Status: DC
Start: 2018-01-14 — End: 2018-01-13

## 2018-01-13 MED ORDER — MAGNESIUM OXIDE 400 MG PO TABS
400.00 | ORAL_TABLET | ORAL | Status: DC
Start: 2018-01-13 — End: 2018-01-13

## 2018-01-13 MED ORDER — ACETAMINOPHEN 325 MG PO TABS
650.00 | ORAL_TABLET | ORAL | Status: DC
Start: ? — End: 2018-01-13

## 2018-01-13 MED ORDER — LIDOCAINE HCL 1 % IJ SOLN
0.50 | INTRAMUSCULAR | Status: DC
Start: ? — End: 2018-01-13

## 2018-01-13 MED ORDER — TRAZODONE HCL 50 MG PO TABS
150.00 | ORAL_TABLET | ORAL | Status: DC
Start: ? — End: 2018-01-13

## 2018-01-13 MED ORDER — HYDROCORTISONE 10 MG PO TABS
20.00 | ORAL_TABLET | ORAL | Status: DC
Start: 2018-01-14 — End: 2018-01-13

## 2018-01-13 MED ORDER — FLUOXETINE HCL 10 MG PO CAPS
10.00 | ORAL_CAPSULE | ORAL | Status: DC
Start: 2018-01-14 — End: 2018-01-13

## 2018-01-13 MED ORDER — ATORVASTATIN CALCIUM 40 MG PO TABS
40.00 | ORAL_TABLET | ORAL | Status: DC
Start: 2018-01-13 — End: 2018-01-13

## 2018-01-13 MED ORDER — FLUDROCORTISONE ACETATE 0.1 MG PO TABS
0.10 | ORAL_TABLET | ORAL | Status: DC
Start: 2018-01-14 — End: 2018-01-13

## 2018-01-13 MED ORDER — HYDROCORTISONE 10 MG PO TABS
10.00 | ORAL_TABLET | ORAL | Status: DC
Start: 2018-01-13 — End: 2018-01-13

## 2018-01-13 MED ORDER — GENERIC EXTERNAL MEDICATION
125.00 | Status: DC
Start: 2018-01-14 — End: 2018-01-13

## 2018-01-13 MED ORDER — SENNOSIDES-DOCUSATE SODIUM 8.6-50 MG PO TABS
2.00 | ORAL_TABLET | ORAL | Status: DC
Start: 2018-01-13 — End: 2018-01-13

## 2018-01-15 ENCOUNTER — Telehealth: Payer: Self-pay | Admitting: Emergency Medicine

## 2018-01-15 ENCOUNTER — Telehealth: Payer: Self-pay | Admitting: Internal Medicine

## 2018-01-15 ENCOUNTER — Other Ambulatory Visit: Payer: Self-pay | Admitting: Internal Medicine

## 2018-01-15 MED ORDER — GLUCOSE BLOOD VI STRP: ORAL_STRIP | 2 refills | Status: DC

## 2018-01-15 NOTE — Telephone Encounter (Signed)
Resent with DX code

## 2018-01-15 NOTE — Telephone Encounter (Signed)
Patient called asking that you call the pharmacy about her test strips. They are needing a code to get this filled. Thanks.

## 2018-01-15 NOTE — Telephone Encounter (Signed)
Copied from [place] [id number]. Topic: Inquiry >> Jan 15, 2018  8:19 AM Pricilla Handler wrote: Reason for CRM: Patient called wanting to speak with Adair Laundry and/or Dr. Derrel Nip regarding her new diagnosis of Addison's Disease and Schmidt Syndrome. 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 Patient has requested that [facility] and/or Dr. Derrel Nip give her a call today.       Thank You!!!

## 2018-01-16 ENCOUNTER — Encounter: Payer: Self-pay | Admitting: Internal Medicine

## 2018-01-17 ENCOUNTER — Encounter: Payer: Self-pay | Admitting: Internal Medicine

## 2018-01-17 ENCOUNTER — Ambulatory Visit (INDEPENDENT_AMBULATORY_CARE_PROVIDER_SITE_OTHER): Payer: Medicare Other | Admitting: Internal Medicine

## 2018-01-17 VITALS — BP 92/56 | HR 67 | Temp 98.4°F | Resp 16 | Ht 61.0 in | Wt 140.4 lb

## 2018-01-17 DIAGNOSIS — E871 Hypo-osmolality and hyponatremia: Secondary | ICD-10-CM | POA: Diagnosis not present

## 2018-01-17 DIAGNOSIS — E875 Hyperkalemia: Secondary | ICD-10-CM | POA: Diagnosis not present

## 2018-01-17 DIAGNOSIS — E271 Primary adrenocortical insufficiency: Secondary | ICD-10-CM | POA: Diagnosis not present

## 2018-01-17 DIAGNOSIS — E039 Hypothyroidism, unspecified: Secondary | ICD-10-CM | POA: Diagnosis not present

## 2018-01-17 DIAGNOSIS — E1165 Type 2 diabetes mellitus with hyperglycemia: Secondary | ICD-10-CM | POA: Diagnosis not present

## 2018-01-17 DIAGNOSIS — Z09 Encounter for follow-up examination after completed treatment for conditions other than malignant neoplasm: Secondary | ICD-10-CM | POA: Diagnosis not present

## 2018-01-17 LAB — COMPREHENSIVE METABOLIC PANEL
ALT: 27 U/L (ref 0–35)
AST: 26 U/L (ref 0–37)
Albumin: 3.7 g/dL (ref 3.5–5.2)
Alkaline Phosphatase: 79 U/L (ref 39–117)
BILIRUBIN TOTAL: 0.4 mg/dL (ref 0.2–1.2)
BUN: 12 mg/dL (ref 6–23)
CALCIUM: 9.6 mg/dL (ref 8.4–10.5)
CHLORIDE: 97 meq/L (ref 96–112)
CO2: 29 meq/L (ref 19–32)
Creatinine, Ser: 0.98 mg/dL (ref 0.40–1.20)
GFR: 59.72 mL/min — AB (ref >60.00)
GLUCOSE: 239 mg/dL — AB (ref 70–99)
Potassium: 5.1 mEq/L (ref 3.5–5.1)
Sodium: 133 mEq/L — ABNORMAL LOW (ref 135–145)
Total Protein: 6.6 g/dL (ref 6.0–8.3)

## 2018-01-17 LAB — HEMOGLOBIN A1C: HEMOGLOBIN A1C: 8.4 % — AB (ref 4.6–6.5)

## 2018-01-17 LAB — TSH: TSH: 3.54 u[IU]/mL (ref 0.35–4.50)

## 2018-01-17 NOTE — Progress Notes (Signed)
Subjective:  Patient ID: Haley Young, female    DOB: 1951/06/17  Age: 67 y.o. MRN: [id number]  CC: The primary encounter diagnosis was Hyperkalemia. Diagnoses of Hypothyroidism, unspecified type, Hyponatremia, Type 2 diabetes mellitus with hyperglycemia, without long-term current use of insulin ([place]), Adrenal insufficiency (Addison's disease) ([place]), and Hospital discharge follow-up were also pertinent to this visit.  HPI Haley Young presents for hospital follow up.  Patient was admitted to [facility] on July 1 with a history of persistent dyspnea with minimal exertion,  generalized weakness, anorexia,  hypotension and her unintnentional weight loss  Of 6 lbs.  Symptoms started after her pulmonic valve replacement (AKA Melody valve) in  May and were initially presumed to be secondary to failure of valve and worsening heart failure . However she had hyponatremia and hyperkalemia, and underwent cortisol stim test in the cath labs which confirmed arenal insufficiency  when cortisol level failed to increase (actually dropped from  8.8 to 5.6) and ACTH was elevated at  360 . She had no recent history of steroid use.  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  She was discharged home on July 3 with new medications (twice daily hydrocortisone and once daily florinef) and  has been taking them as directed    She is currenlty endorsing persistent fatigue , early mornign weakness,  And some shortness of breath with exertion.  Has not been  checking bp yet,  Doesn't own a cuff.  Metformin dose was  increased to 1000 mg bid.  She denies diarrhea  Today's fasting BS was 147    Lab Results  Component Value Date   HGBA1C 8.4 (H) 01/17/2018    a Outpatient Medications Prior to Visit  Medication Sig Dispense Refill  . aspirin 81 MG chewable tablet Chew 81 mg by mouth  daily.    Marland Kitchen atorvastatin (LIPITOR) 20 MG tablet TAKE 1 TABLET BY MOUTH ONCE DAILY 90 tablet 1  . fludrocortisone (FLORINEF) 0.1 MG tablet Take 100 mcg by mouth daily.  0  . FLUoxetine (PROZAC) 10 MG tablet Take 1 tablet (10 mg total) by mouth daily. 90 tablet 0  . glipiZIDE (GLIPIZIDE XL) 5 MG 24 hr tablet TAKE 2 TABLETS BY MOUTH IN THE MORNING AND 1 IN THE EVENING 270 tablet 3  . glucose blood (ONE TOUCH ULTRA TEST) test strip USE ONE STRIP TO CHECK GLUCOSE THREE TIMES DAILY AS DIRECTED E11.65 200 each 2  . hydrocortisone (CORTEF) 10 MG tablet Take 10 mg by mouth daily.  0  . hydrocortisone (CORTEF) 20 MG tablet Take 20 mg by mouth daily.  0  . levothyroxine (SYNTHROID, LEVOTHROID) 125 MCG tablet TAKE ONE TABLET BY MOUTH ONCE DAILY 90 tablet 3  . LORazepam (ATIVAN) 1 MG tablet Take 0.5 tablets (0.5 mg total) by mouth 2 (two) times daily as needed. for anxiety 30 tablet 3  . metFORMIN (GLUCOPHAGE) 1000 MG tablet Take 1 tablet (1,000 mg total) by mouth 2 (two) times daily with a meal. 180 tablet 3  . SOLU-CORTEF 100 MG SOLR injection INJECT 2MLS INTO THE MUSCLE AS DIRECTED  0  . traZODone (DESYREL) 150 MG tablet TAKE 1 TABLET BY MOUTH AT BEDTIME 90 tablet 1  . furosemide (LASIX) 20 MG tablet Take 20 mg by mouth 2 (two) times daily.    Marland Kitchen umeclidinium-vilanterol [facility]  ELLIPTA) 62.5-25 MCG/INH AEPB Inhale 1 puff into the lungs daily. (Patient not taking: Reported on 01/17/2018) 14 each 0   No facility-administered medications prior to visit.     Review of Systems;  Patient denies headache, fevers, malaise, unintentional weight loss, skin rash, eye pain, sinus congestion and sinus pain, sore throat, dysphagia,  hemoptysis , cough, wheezing, chest pain, palpitations, orthopnea, edema, abdominal pain, nausea, melena, diarrhea, constipation, flank pain, dysuria, hematuria, urinary  Frequency, nocturia, numbness, tingling, seizures,  Focal weakness, Loss of consciousness,  Tremor, insomnia, depression,  anxiety, and suicidal ideation.      Objective:  BP (!) 92/56 (BP Location: Left Arm, Patient Position: Sitting, Cuff Size: Normal)   Pulse 67   Temp 98.4 F (36.9 C) (Oral)   Resp 16   Ht 5\' 1"  (1.549 m)   Wt 140 lb 6.4 oz (63.7 kg)   SpO2 98%   BMI 26.53 kg/m   BP Readings from Last 3 Encounters:  01/17/18 (!) 92/56  01/03/18 118/78  09/08/17 100/62    Wt Readings from Last 3 Encounters:  01/17/18 140 lb 6.4 oz (63.7 kg)  01/03/18 131 lb (59.4 kg)  09/08/17 143 lb (64.9 kg)    General appearance: alert, cooperative and appears stated age Ears: normal TM's and external ear canals both ears Throat: lips, mucosa, and tongue normal; teeth and gums normal Neck: no adenopathy, no carotid bruit, supple, symmetrical, trachea midline and thyroid not enlarged, symmetric, no tenderness/mass/nodules Back: symmetric, no curvature. ROM normal. No CVA tenderness. Lungs: clear to auscultation bilaterally Heart: regular rate and rhythm, S1, S2 normal, no murmur, click, rub or gallop Abdomen: soft, non-tender; bowel sounds normal; no masses,  no organomegaly Pulses: 2+ and symmetric Skin: Skin color, texture, turgor normal. No rashes or lesions Lymph nodes: Cervical, supraclavicular, and axillary nodes normal.  Lab Results  Component Value Date   HGBA1C 8.4 (H) 01/17/2018   HGBA1C 7.0 (H) 05/11/2017   HGBA1C 9.1 08/21/2015    Lab Results  Component Value Date   CREATININE 0.98 01/17/2018   CREATININE 1.18 (H) 08/09/2017   CREATININE 1.02 05/17/2017    Lab Results  Component Value Date   WBC 10.4 08/17/2017   HGB 14.1 08/17/2017   HCT 42.1 08/17/2017   PLT 255.0 08/17/2017   GLUCOSE 239 (H) 01/17/2018   CHOL 239 (H) 04/14/2016   TRIG 162.0 (H) 04/14/2016   HDL 57.90 04/14/2016   LDLDIRECT 165.0 04/14/2016   LDLCALC 149 (H) 04/14/2016   ALT 27 01/17/2018   AST 26 01/17/2018   NA 133 (L) 01/17/2018   K 5.1 01/17/2018   CL 97 01/17/2018   CREATININE 0.98 01/17/2018     BUN 12 01/17/2018   CO2 29 01/17/2018   TSH 3.54 01/17/2018   HGBA1C 8.4 (H) 01/17/2018   MICROALBUR 1.2 05/17/2017    Dg Chest 2 View  Result Date: 08/09/2017 CLINICAL DATA:  Shortness of breath for a week.  Nonsmoker. EXAM: CHEST  2 VIEW COMPARISON:  10/18/2016 FINDINGS: Prior median sternotomy. Moderate thoracic spondylosis. Convex right thoracic spine curvature. Midline trachea. Mild cardiomegaly. Prior pulmonary valve repair. No pleural effusion or pneumothorax. No overt congestive failure. Left base scarring. IMPRESSION: No acute cardiopulmonary disease. Cardiomegaly without congestive failure. Electronically Signed   By: Abigail Miyamoto M.D.   On: 08/09/2017 12:02    Assessment & Plan:   Problem List Items Addressed This Visit    Adrenal insufficiency (Addison's disease) ([place])    Newly diagnosed during [facility] admission  July 1 presenting with hypotension, hyponatremia, weight loss .  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  Increasing her florinef dos to 0.2 mg daily given continued hypotension and profound fatigue.   Continue hydrocortisone bid  Med alert bracelet and home BP monitor needed ASAP  .  Checking lytes, today,  Sodium slightly low and K at upper limit of normal due to AI.  Low potassium diet recommended.  Lab Results  Component Value Date   CREATININE 0.98 01/17/2018   Lab Results  Component Value Date   NA 133 (L) 01/17/2018   K 5.1 01/17/2018   CL 97 01/17/2018   CO2 29 01/17/2018         Hospital discharge follow-up    Patient has unresolved issues post discharge but is relatively stable and does not need readmission.  Issues were addressed today at  hospital follow up. All labs , imaging studies and progress notes from admission were reviewed with patient today        RESOLVED: Hyperkalemia - Primary   Hyponatremia   Relevant Orders   Comprehensive metabolic panel (Completed)   Hypothyroidism   Relevant Orders   TSH (Completed)   Type 2 diabetes mellitus with  hyperglycemia, without long-term current use of insulin ([place])    Has become uncontrolled since last evaluation .  Will likely need to add Jardiance vs insulin since she is maximally using metformin and glipizide.  Will decide once I can assess her blood sugars fasting and random for the next 2 weeks.    Lab Results  Component Value Date   HGBA1C 8.4 (H) 01/17/2018         Relevant Orders   Hemoglobin A1c (Completed)      I have discontinued Zigmund Daniel S. Whittaker's furosemide and umeclidinium-vilanterol. I am also having her maintain her levothyroxine, metFORMIN, atorvastatin, traZODone, FLUoxetine, glipiZIDE, LORazepam, aspirin, glucose blood, fludrocortisone, hydrocortisone, hydrocortisone, and SOLU-CORTEF.  No orders of the defined types were placed in this encounter.   Medications Discontinued During This Encounter  Medication Reason  . furosemide (LASIX) 20 MG tablet Patient has not taken in last 30 days  . umeclidinium-vilanterol (ANORO ELLIPTA) 62.5-25 MCG/INH AEPB Patient has not taken in last 30 days    Follow-up: No follow-ups on file.   [name], MD

## 2018-01-17 NOTE — Telephone Encounter (Signed)
Pt was in the office today and discussed this with Dr. Derrel Nip.

## 2018-01-17 NOTE — Progress Notes (Signed)
e

## 2018-01-17 NOTE — Patient Instructions (Addendum)
Your blood pressure is still too low. I want you to double your florinef dose ,   To 0.2 mg daily and follow your BP readings.  Goal is 100/60 .  If you do not see the bp improve,  Let me know   Return next week for potassium check and BP check

## 2018-01-19 DIAGNOSIS — I370 Nonrheumatic pulmonary valve stenosis: Secondary | ICD-10-CM | POA: Diagnosis not present

## 2018-01-19 DIAGNOSIS — I5023 Acute on chronic systolic (congestive) heart failure: Secondary | ICD-10-CM | POA: Diagnosis not present

## 2018-01-19 DIAGNOSIS — I442 Atrioventricular block, complete: Secondary | ICD-10-CM | POA: Diagnosis not present

## 2018-01-19 DIAGNOSIS — I10 Essential (primary) hypertension: Secondary | ICD-10-CM | POA: Diagnosis not present

## 2018-01-20 DIAGNOSIS — Z09 Encounter for follow-up examination after completed treatment for conditions other than malignant neoplasm: Secondary | ICD-10-CM | POA: Insufficient documentation

## 2018-01-20 DIAGNOSIS — E271 Primary adrenocortical insufficiency: Secondary | ICD-10-CM | POA: Insufficient documentation

## 2018-01-20 NOTE — Assessment & Plan Note (Signed)
Patient has unresolved issues post discharge but is relatively stable and does not need readmission.  Issues were addressed today at  hospital follow up. All labs , imaging studies and progress notes from admission were reviewed with patient today

## 2018-01-20 NOTE — Assessment & Plan Note (Signed)
Has become uncontrolled since last evaluation .  Will likely need to add Jardiance vs insulin since she is maximally using metformin and glipizide.  Will decide once I can assess her blood sugars fasting and random for the next 2 weeks.    Lab Results  Component Value Date   HGBA1C 8.4 (H) 01/17/2018

## 2018-01-20 NOTE — Assessment & Plan Note (Signed)
Newly diagnosed during [facility] admission July 1 presenting with hypotension, hyponatremia, weight loss .  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  Increasing her florinef dos to 0.2 mg daily given continued hypotension and profound fatigue.   Continue hydrocortisone bid  Med alert bracelet and home BP monitor needed ASAP  .  Checking lytes, today,  Sodium slightly low and K at upper limit of normal due to AI.  Low potassium diet recommended.  Lab Results  Component Value Date   CREATININE 0.98 01/17/2018   Lab Results  Component Value Date   NA 133 (L) 01/17/2018   K 5.1 01/17/2018   CL 97 01/17/2018   CO2 29 01/17/2018

## 2018-01-22 ENCOUNTER — Telehealth: Payer: Self-pay

## 2018-01-22 NOTE — Telephone Encounter (Signed)
Spoke with pt to let her know that I have placed some blood sugar logs up front for her to come by and pick up. Pt gave a verbal understanding.

## 2018-01-22 NOTE — Telephone Encounter (Signed)
Copied from [place] [id number]. Topic: General - Other >> Jan 22, 2018  9:22 AM Synthia Innocent wrote: Reason for CRM: Requesting a flow chart for her blood sugars. Do we have one on hand?

## 2018-01-23 ENCOUNTER — Other Ambulatory Visit: Payer: Self-pay | Admitting: Internal Medicine

## 2018-01-23 DIAGNOSIS — E875 Hyperkalemia: Secondary | ICD-10-CM

## 2018-01-25 ENCOUNTER — Ambulatory Visit (INDEPENDENT_AMBULATORY_CARE_PROVIDER_SITE_OTHER): Payer: Medicare Other | Admitting: *Deleted

## 2018-01-25 VITALS — BP 118/66 | HR 60 | Resp 16

## 2018-01-25 DIAGNOSIS — E271 Primary adrenocortical insufficiency: Secondary | ICD-10-CM | POA: Diagnosis not present

## 2018-01-25 DIAGNOSIS — I9589 Other hypotension: Secondary | ICD-10-CM

## 2018-01-25 NOTE — Progress Notes (Signed)
Patient returned for 1 week Follow up on BP, for Addison's disease, after increasing Florinef to 0.2 mg daily, Goal BP for patient in PCP 100/60. Today's BP left arm 110/60 pulse 60, right arm 15 minutes later 118/66 pulse 60.

## 2018-01-26 NOTE — Progress Notes (Signed)
BP is at goal.  Continue 0.2 mg florinef daily   I have reviewed the encounter and have made recommendations above.   Deborra Medina, MD

## 2018-01-31 NOTE — Progress Notes (Signed)
Patient notified and voiced understanding.

## 2018-02-07 ENCOUNTER — Other Ambulatory Visit: Payer: Self-pay | Admitting: Internal Medicine

## 2018-02-13 DIAGNOSIS — E310 Autoimmune polyglandular failure: Secondary | ICD-10-CM | POA: Diagnosis not present

## 2018-02-13 DIAGNOSIS — E271 Primary adrenocortical insufficiency: Secondary | ICD-10-CM | POA: Diagnosis not present

## 2018-02-13 DIAGNOSIS — E038 Other specified hypothyroidism: Secondary | ICD-10-CM | POA: Diagnosis not present

## 2018-02-13 DIAGNOSIS — E1165 Type 2 diabetes mellitus with hyperglycemia: Secondary | ICD-10-CM | POA: Diagnosis not present

## 2018-02-13 DIAGNOSIS — E063 Autoimmune thyroiditis: Secondary | ICD-10-CM | POA: Diagnosis not present

## 2018-02-14 ENCOUNTER — Encounter: Payer: Self-pay | Admitting: Internal Medicine

## 2018-02-14 DIAGNOSIS — I371 Nonrheumatic pulmonary valve insufficiency: Secondary | ICD-10-CM | POA: Diagnosis not present

## 2018-02-14 DIAGNOSIS — Q221 Congenital pulmonary valve stenosis: Secondary | ICD-10-CM | POA: Diagnosis not present

## 2018-03-06 ENCOUNTER — Ambulatory Visit (INDEPENDENT_AMBULATORY_CARE_PROVIDER_SITE_OTHER): Payer: Medicare Other | Admitting: Family Medicine

## 2018-03-06 ENCOUNTER — Encounter: Payer: Self-pay | Admitting: Family Medicine

## 2018-03-06 VITALS — BP 98/60 | HR 92 | Temp 98.4°F | Resp 15 | Ht 61.0 in | Wt 143.1 lb

## 2018-03-06 DIAGNOSIS — M545 Low back pain, unspecified: Secondary | ICD-10-CM

## 2018-03-06 DIAGNOSIS — R1084 Generalized abdominal pain: Secondary | ICD-10-CM

## 2018-03-06 DIAGNOSIS — R197 Diarrhea, unspecified: Secondary | ICD-10-CM | POA: Diagnosis not present

## 2018-03-06 LAB — HEPATIC FUNCTION PANEL
ALK PHOS: 56 U/L (ref 39–117)
ALT: 14 U/L (ref 0–35)
AST: 19 U/L (ref 0–37)
Albumin: 3.9 g/dL (ref 3.5–5.2)
BILIRUBIN DIRECT: 0.1 mg/dL (ref 0.0–0.3)
Total Bilirubin: 0.4 mg/dL (ref 0.2–1.2)
Total Protein: 6.7 g/dL (ref 6.0–8.3)

## 2018-03-06 LAB — BASIC METABOLIC PANEL
BUN: 11 mg/dL (ref 6–23)
CALCIUM: 9.7 mg/dL (ref 8.4–10.5)
CO2: 31 mEq/L (ref 19–32)
Chloride: 102 mEq/L (ref 96–112)
Creatinine, Ser: 0.92 mg/dL (ref 0.40–1.20)
GFR: 64.21 mL/min (ref >60.00)
GLUCOSE: 195 mg/dL — AB (ref 70–99)
POTASSIUM: 4.1 meq/L (ref 3.5–5.1)
SODIUM: 140 meq/L (ref 135–145)

## 2018-03-06 LAB — CBC
HCT: 37.6 % (ref 36.0–46.0)
Hemoglobin: 12.4 g/dL (ref 12.0–15.0)
MCHC: 33 g/dL (ref 30.0–36.0)
MCV: 90.2 fl (ref 78.0–100.0)
Platelets: 248 10*3/uL (ref 150.0–400.0)
RBC: 4.17 Mil/uL (ref 3.87–5.11)
RDW: 14.5 % (ref 11.5–15.5)
WBC: 8.2 10*3/uL (ref 4.0–10.5)

## 2018-03-06 LAB — LIPASE: Lipase: 56 U/L (ref 11.0–59.0)

## 2018-03-06 LAB — AMYLASE: Amylase: 38 U/L (ref 27–131)

## 2018-03-06 MED ORDER — LOPERAMIDE HCL 2 MG PO TABS: 2.0000 mg | ORAL_TABLET | Freq: Four times a day (QID) | ORAL | 0 refills | Status: DC | PRN

## 2018-03-06 NOTE — Patient Instructions (Signed)

## 2018-03-06 NOTE — Progress Notes (Signed)
Subjective:    Patient ID: Haley Young, female    DOB: [birth year], 67 y.o.   MRN: [id number]  HPI  Presents to clinic c/o diarrhea and ABD pain for 2 weeks. States when she feels gas with diffuse abdominal , and urge have BM - she has to run to the bathroom quickly and will have loose, watery stools.   States she has been keeping self well-hydrated with water.  Has been eating a fairly normal diet, had macaroni and cheese for dinner last night.  Denies fever or chills.  Denies vomiting.  Also reports left-sided low back pain for the past few days.  Denies any fall or known injury.  States that she has been using a heated massager on area which helps reduce pain.  Has no numbness and tingling down the legs, no saddle anesthesia, no loss of bowel or bladder control.  Patient Active Problem List   Diagnosis Date Noted  . Adrenal insufficiency (Addison's disease) ([place]) 01/20/2018  . Hospital discharge follow-up 01/20/2018  . 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 Otitis media 08/17/2017  . CHB (complete heart block) ([place]) 08/17/2017  . CHF (congestive heart failure) ([place]) 08/09/2017  . Mobitz type 2 second degree AV block 08/09/2017  . Decreased GFR 05/18/2017  . Hyponatremia 11/01/2016  . Tachycardia 10/18/2016  . Drug-induced vitamin B12 deficiency anemia 04/16/2016  . Type 2 diabetes mellitus with hyperglycemia, without long-term current use of insulin ([place]) 08/21/2015  . Overweight (BMI 25.0-29.9) 10/07/2014  . Depression with anxiety 08/08/2014  . Medicare annual wellness visit, initial 09/13/2013  . Hyperlipidemia associated with type 2 diabetes mellitus ([place]) 07/25/2013  . Insomnia 07/25/2013  . Hypothyroidism 03/29/2012  . Screening for breast cancer 03/29/2012  . Screening for colon cancer 03/29/2012  . GERD 08/13/2008  . Osteopenia 08/13/2008  . COLONIC POLYPS, HX OF 08/13/2008   Social History   Tobacco Use  . Smoking status: Never Smoker  .  Smokeless tobacco: Never Used  Substance Use Topics  . Alcohol use: Yes    Comment: occasional wine     Review of Systems  Constitutional: Negative for chills, fatigue and fever.  HENT: Negative for congestion, ear pain, sinus pain and sore throat.   Eyes: Negative.   Respiratory: Negative for cough, shortness of breath and wheezing.   Cardiovascular: Negative for chest pain, palpitations and leg swelling.  Gastrointestinal: Positive for abdominal pain, diarrhea.  No nausea and vomiting.  Genitourinary: Negative for dysuria, frequency and urgency.  Musculoskeletal: +left low back pain Skin: Negative for color change, pallor and rash.  Neurological: Negative for syncope, light-headedness and headaches.  Psychiatric/Behavioral: The patient is not nervous/anxious.    Objective:   Physical Exam  Constitutional: She appears well-developed and well-nourished. No distress.  HENT:  Head: Normocephalic and atraumatic.  Eyes: EOM are normal. No scleral icterus.  Neck: Normal range of motion. Neck supple. No tracheal deviation present.  Cardiovascular: Normal rate and regular rhythm.  Pulmonary/Chest: Effort normal and breath sounds normal. No respiratory distress. She has no wheezes. She has no rales.  Abdominal: Soft. Bowel sounds are normal. She exhibits no distension and no mass. There is no rebound and no guarding.  No specific point tenderness on ABD exam  Musculoskeletal: Normal range of motion. She exhibits no deformity.       Lumbar back: She exhibits tenderness.  Gait normal. +left sided paraspinal muscle tenderness with palpation.  Skin: She is not diaphoretic.  Nursing note and  vitals reviewed.   Vitals:   03/06/18 0919  BP: 98/60  Pulse: 92  Resp: 15  Temp: 98.4 F (36.9 C)  SpO2: 97%     Assessment & Plan:    ABD pain/diarrhea --patient advised to eat a bland diet, and can use Imodium as needed for diarrhea control.  Prescription for Imodium sent to pharmacy.   Lab work collected including CBC, electrolyte panel, liver kidney function, amylase and lipase.  Patient raises concerns about her pancreas would like amylase and lipase checked.  Also due to length of time of diarrhea stool culture and C. difficile testing ordered. CT abd/pelvis ordered to further eval pain and diarrhea.   Back pain - left-sided tenderness, but otherwise range of motion intact.  Patient advised she can use Tylenol as needed for pain, and topical Biofreeze rub.  Also encouraged to continue use of heated massaging pad as this can help soothe pain.  Follow up if symptoms worsen or fail to improve. Testing results will help lead [place] to next step in plan of care.

## 2018-03-14 DIAGNOSIS — Z950 Presence of cardiac pacemaker: Secondary | ICD-10-CM | POA: Diagnosis not present

## 2018-03-14 DIAGNOSIS — Z45018 Encounter for adjustment and management of other part of cardiac pacemaker: Secondary | ICD-10-CM | POA: Diagnosis not present

## 2018-03-14 DIAGNOSIS — I442 Atrioventricular block, complete: Secondary | ICD-10-CM | POA: Diagnosis not present

## 2018-03-21 DIAGNOSIS — I4891 Unspecified atrial fibrillation: Secondary | ICD-10-CM | POA: Diagnosis present

## 2018-03-21 DIAGNOSIS — E872 Acidosis: Secondary | ICD-10-CM | POA: Diagnosis not present

## 2018-03-21 DIAGNOSIS — I509 Heart failure, unspecified: Secondary | ICD-10-CM | POA: Diagnosis not present

## 2018-03-21 DIAGNOSIS — I469 Cardiac arrest, cause unspecified: Secondary | ICD-10-CM | POA: Diagnosis not present

## 2018-03-21 DIAGNOSIS — I462 Cardiac arrest due to underlying cardiac condition: Secondary | ICD-10-CM | POA: Diagnosis not present

## 2018-03-21 DIAGNOSIS — R0989 Other specified symptoms and signs involving the circulatory and respiratory systems: Secondary | ICD-10-CM | POA: Diagnosis not present

## 2018-03-21 DIAGNOSIS — J9811 Atelectasis: Secondary | ICD-10-CM | POA: Diagnosis not present

## 2018-03-21 DIAGNOSIS — M549 Dorsalgia, unspecified: Secondary | ICD-10-CM | POA: Diagnosis not present

## 2018-03-21 DIAGNOSIS — I371 Nonrheumatic pulmonary valve insufficiency: Secondary | ICD-10-CM | POA: Diagnosis not present

## 2018-03-21 DIAGNOSIS — I21A1 Myocardial infarction type 2: Secondary | ICD-10-CM | POA: Diagnosis not present

## 2018-03-21 DIAGNOSIS — Z952 Presence of prosthetic heart valve: Secondary | ICD-10-CM | POA: Diagnosis not present

## 2018-03-21 DIAGNOSIS — R918 Other nonspecific abnormal finding of lung field: Secondary | ICD-10-CM | POA: Diagnosis not present

## 2018-03-21 DIAGNOSIS — I361 Nonrheumatic tricuspid (valve) insufficiency: Secondary | ICD-10-CM | POA: Diagnosis not present

## 2018-03-21 DIAGNOSIS — J9601 Acute respiratory failure with hypoxia: Secondary | ICD-10-CM | POA: Diagnosis not present

## 2018-03-21 DIAGNOSIS — M7989 Other specified soft tissue disorders: Secondary | ICD-10-CM | POA: Diagnosis not present

## 2018-03-21 DIAGNOSIS — I340 Nonrheumatic mitral (valve) insufficiency: Secondary | ICD-10-CM | POA: Diagnosis not present

## 2018-03-21 DIAGNOSIS — J90 Pleural effusion, not elsewhere classified: Secondary | ICD-10-CM | POA: Diagnosis not present

## 2018-03-21 DIAGNOSIS — I517 Cardiomegaly: Secondary | ICD-10-CM | POA: Diagnosis not present

## 2018-03-21 DIAGNOSIS — R0602 Shortness of breath: Secondary | ICD-10-CM | POA: Diagnosis not present

## 2018-03-21 DIAGNOSIS — K5641 Fecal impaction: Secondary | ICD-10-CM | POA: Diagnosis not present

## 2018-03-21 DIAGNOSIS — Z9911 Dependence on respirator [ventilator] status: Secondary | ICD-10-CM | POA: Diagnosis not present

## 2018-03-21 DIAGNOSIS — E860 Dehydration: Secondary | ICD-10-CM | POA: Diagnosis not present

## 2018-03-21 DIAGNOSIS — Z8679 Personal history of other diseases of the circulatory system: Secondary | ICD-10-CM | POA: Diagnosis not present

## 2018-03-21 DIAGNOSIS — J690 Pneumonitis due to inhalation of food and vomit: Secondary | ICD-10-CM | POA: Diagnosis not present

## 2018-03-21 DIAGNOSIS — Z950 Presence of cardiac pacemaker: Secondary | ICD-10-CM | POA: Diagnosis not present

## 2018-03-21 DIAGNOSIS — E1165 Type 2 diabetes mellitus with hyperglycemia: Secondary | ICD-10-CM | POA: Diagnosis not present

## 2018-03-21 DIAGNOSIS — E876 Hypokalemia: Secondary | ICD-10-CM | POA: Diagnosis present

## 2018-03-21 DIAGNOSIS — D72829 Elevated white blood cell count, unspecified: Secondary | ICD-10-CM | POA: Diagnosis not present

## 2018-03-21 DIAGNOSIS — Z953 Presence of xenogenic heart valve: Secondary | ICD-10-CM | POA: Diagnosis not present

## 2018-03-21 DIAGNOSIS — I4892 Unspecified atrial flutter: Secondary | ICD-10-CM | POA: Diagnosis not present

## 2018-03-21 DIAGNOSIS — G9340 Encephalopathy, unspecified: Secondary | ICD-10-CM | POA: Diagnosis not present

## 2018-03-21 DIAGNOSIS — R197 Diarrhea, unspecified: Secondary | ICD-10-CM | POA: Diagnosis not present

## 2018-03-21 DIAGNOSIS — Q249 Congenital malformation of heart, unspecified: Secondary | ICD-10-CM | POA: Diagnosis not present

## 2018-03-21 DIAGNOSIS — E271 Primary adrenocortical insufficiency: Secondary | ICD-10-CM | POA: Diagnosis not present

## 2018-03-21 DIAGNOSIS — J929 Pleural plaque without asbestos: Secondary | ICD-10-CM | POA: Diagnosis not present

## 2018-03-21 DIAGNOSIS — J189 Pneumonia, unspecified organism: Secondary | ICD-10-CM | POA: Diagnosis not present

## 2018-03-21 DIAGNOSIS — Z452 Encounter for adjustment and management of vascular access device: Secondary | ICD-10-CM | POA: Diagnosis not present

## 2018-03-21 DIAGNOSIS — R579 Shock, unspecified: Secondary | ICD-10-CM | POA: Diagnosis not present

## 2018-03-21 DIAGNOSIS — I6782 Cerebral ischemia: Secondary | ICD-10-CM | POA: Diagnosis not present

## 2018-03-21 DIAGNOSIS — E874 Mixed disorder of acid-base balance: Secondary | ICD-10-CM | POA: Diagnosis not present

## 2018-03-21 DIAGNOSIS — I351 Nonrheumatic aortic (valve) insufficiency: Secondary | ICD-10-CM | POA: Diagnosis not present

## 2018-03-22 DIAGNOSIS — R531 Weakness: Secondary | ICD-10-CM | POA: Diagnosis not present

## 2018-03-22 DIAGNOSIS — E272 Addisonian crisis: Secondary | ICD-10-CM | POA: Diagnosis not present

## 2018-03-22 DIAGNOSIS — Z7409 Other reduced mobility: Secondary | ICD-10-CM | POA: Diagnosis not present

## 2018-03-22 DIAGNOSIS — I472 Ventricular tachycardia: Secondary | ICD-10-CM | POA: Diagnosis not present

## 2018-03-22 DIAGNOSIS — I340 Nonrheumatic mitral (valve) insufficiency: Secondary | ICD-10-CM | POA: Diagnosis not present

## 2018-03-22 DIAGNOSIS — Z7189 Other specified counseling: Secondary | ICD-10-CM | POA: Diagnosis not present

## 2018-03-22 DIAGNOSIS — E038 Other specified hypothyroidism: Secondary | ICD-10-CM | POA: Diagnosis not present

## 2018-03-22 DIAGNOSIS — Q221 Congenital pulmonary valve stenosis: Secondary | ICD-10-CM | POA: Diagnosis not present

## 2018-03-22 DIAGNOSIS — J45909 Unspecified asthma, uncomplicated: Secondary | ICD-10-CM | POA: Diagnosis present

## 2018-03-22 DIAGNOSIS — F418 Other specified anxiety disorders: Secondary | ICD-10-CM | POA: Diagnosis not present

## 2018-03-22 DIAGNOSIS — I371 Nonrheumatic pulmonary valve insufficiency: Secondary | ICD-10-CM | POA: Diagnosis not present

## 2018-03-22 DIAGNOSIS — I462 Cardiac arrest due to underlying cardiac condition: Secondary | ICD-10-CM | POA: Diagnosis not present

## 2018-03-22 DIAGNOSIS — E872 Acidosis: Secondary | ICD-10-CM | POA: Diagnosis not present

## 2018-03-22 DIAGNOSIS — I442 Atrioventricular block, complete: Secondary | ICD-10-CM | POA: Diagnosis present

## 2018-03-22 DIAGNOSIS — Z950 Presence of cardiac pacemaker: Secondary | ICD-10-CM | POA: Diagnosis not present

## 2018-03-22 DIAGNOSIS — I10 Essential (primary) hypertension: Secondary | ICD-10-CM | POA: Diagnosis present

## 2018-03-22 DIAGNOSIS — E039 Hypothyroidism, unspecified: Secondary | ICD-10-CM | POA: Diagnosis not present

## 2018-03-22 DIAGNOSIS — I69391 Dysphagia following cerebral infarction: Secondary | ICD-10-CM | POA: Diagnosis not present

## 2018-03-22 DIAGNOSIS — I110 Hypertensive heart disease with heart failure: Secondary | ICD-10-CM | POA: Diagnosis not present

## 2018-03-22 DIAGNOSIS — R579 Shock, unspecified: Secondary | ICD-10-CM | POA: Diagnosis present

## 2018-03-22 DIAGNOSIS — E119 Type 2 diabetes mellitus without complications: Secondary | ICD-10-CM | POA: Diagnosis not present

## 2018-03-22 DIAGNOSIS — E1165 Type 2 diabetes mellitus with hyperglycemia: Secondary | ICD-10-CM | POA: Diagnosis present

## 2018-03-22 DIAGNOSIS — I2721 Secondary pulmonary arterial hypertension: Secondary | ICD-10-CM | POA: Diagnosis present

## 2018-03-22 DIAGNOSIS — G4700 Insomnia, unspecified: Secondary | ICD-10-CM | POA: Diagnosis not present

## 2018-03-22 DIAGNOSIS — E271 Primary adrenocortical insufficiency: Secondary | ICD-10-CM | POA: Diagnosis not present

## 2018-03-22 DIAGNOSIS — J9601 Acute respiratory failure with hypoxia: Secondary | ICD-10-CM | POA: Diagnosis not present

## 2018-03-22 DIAGNOSIS — E318 Other polyglandular dysfunction: Secondary | ICD-10-CM | POA: Diagnosis not present

## 2018-03-22 DIAGNOSIS — R262 Difficulty in walking, not elsewhere classified: Secondary | ICD-10-CM | POA: Diagnosis not present

## 2018-03-22 DIAGNOSIS — I708 Atherosclerosis of other arteries: Secondary | ICD-10-CM | POA: Diagnosis present

## 2018-03-22 DIAGNOSIS — E063 Autoimmune thyroiditis: Secondary | ICD-10-CM | POA: Diagnosis not present

## 2018-03-22 DIAGNOSIS — J9690 Respiratory failure, unspecified, unspecified whether with hypoxia or hypercapnia: Secondary | ICD-10-CM | POA: Diagnosis not present

## 2018-03-22 DIAGNOSIS — J690 Pneumonitis due to inhalation of food and vomit: Secondary | ICD-10-CM | POA: Diagnosis not present

## 2018-03-22 DIAGNOSIS — I252 Old myocardial infarction: Secondary | ICD-10-CM | POA: Diagnosis not present

## 2018-03-22 DIAGNOSIS — Z7401 Bed confinement status: Secondary | ICD-10-CM | POA: Diagnosis not present

## 2018-03-22 DIAGNOSIS — I517 Cardiomegaly: Secondary | ICD-10-CM | POA: Diagnosis not present

## 2018-03-22 DIAGNOSIS — I959 Hypotension, unspecified: Secondary | ICD-10-CM | POA: Diagnosis not present

## 2018-03-22 DIAGNOSIS — Z7982 Long term (current) use of aspirin: Secondary | ICD-10-CM | POA: Diagnosis not present

## 2018-03-22 DIAGNOSIS — I469 Cardiac arrest, cause unspecified: Secondary | ICD-10-CM | POA: Diagnosis not present

## 2018-03-22 DIAGNOSIS — I82611 Acute embolism and thrombosis of superficial veins of right upper extremity: Secondary | ICD-10-CM | POA: Diagnosis not present

## 2018-03-22 DIAGNOSIS — G931 Anoxic brain damage, not elsewhere classified: Secondary | ICD-10-CM | POA: Diagnosis not present

## 2018-03-22 DIAGNOSIS — R590 Localized enlarged lymph nodes: Secondary | ICD-10-CM | POA: Diagnosis not present

## 2018-03-22 DIAGNOSIS — I5022 Chronic systolic (congestive) heart failure: Secondary | ICD-10-CM | POA: Diagnosis not present

## 2018-03-22 DIAGNOSIS — Z9889 Other specified postprocedural states: Secondary | ICD-10-CM | POA: Diagnosis not present

## 2018-03-22 DIAGNOSIS — Z9181 History of falling: Secondary | ICD-10-CM | POA: Diagnosis not present

## 2018-03-22 DIAGNOSIS — Z952 Presence of prosthetic heart valve: Secondary | ICD-10-CM | POA: Diagnosis not present

## 2018-03-22 DIAGNOSIS — I214 Non-ST elevation (NSTEMI) myocardial infarction: Secondary | ICD-10-CM | POA: Diagnosis not present

## 2018-03-22 DIAGNOSIS — D638 Anemia in other chronic diseases classified elsewhere: Secondary | ICD-10-CM | POA: Diagnosis not present

## 2018-03-22 DIAGNOSIS — R4182 Altered mental status, unspecified: Secondary | ICD-10-CM | POA: Diagnosis not present

## 2018-03-22 DIAGNOSIS — R739 Hyperglycemia, unspecified: Secondary | ICD-10-CM | POA: Diagnosis not present

## 2018-03-22 DIAGNOSIS — R918 Other nonspecific abnormal finding of lung field: Secondary | ICD-10-CM | POA: Diagnosis not present

## 2018-03-22 DIAGNOSIS — E874 Mixed disorder of acid-base balance: Secondary | ICD-10-CM | POA: Diagnosis present

## 2018-03-22 DIAGNOSIS — J90 Pleural effusion, not elsewhere classified: Secondary | ICD-10-CM | POA: Diagnosis not present

## 2018-03-22 DIAGNOSIS — G9340 Encephalopathy, unspecified: Secondary | ICD-10-CM | POA: Diagnosis present

## 2018-03-22 DIAGNOSIS — R270 Ataxia, unspecified: Secondary | ICD-10-CM | POA: Diagnosis not present

## 2018-03-22 DIAGNOSIS — I771 Stricture of artery: Secondary | ICD-10-CM | POA: Diagnosis not present

## 2018-03-22 DIAGNOSIS — J810 Acute pulmonary edema: Secondary | ICD-10-CM | POA: Diagnosis not present

## 2018-03-22 DIAGNOSIS — E873 Alkalosis: Secondary | ICD-10-CM | POA: Diagnosis not present

## 2018-03-22 DIAGNOSIS — R1312 Dysphagia, oropharyngeal phase: Secondary | ICD-10-CM | POA: Diagnosis not present

## 2018-03-22 DIAGNOSIS — Z8679 Personal history of other diseases of the circulatory system: Secondary | ICD-10-CM | POA: Diagnosis not present

## 2018-03-22 DIAGNOSIS — Q256 Stenosis of pulmonary artery: Secondary | ICD-10-CM | POA: Diagnosis not present

## 2018-03-22 DIAGNOSIS — E0800 Diabetes mellitus due to underlying condition with hyperosmolarity without nonketotic hyperglycemic-hyperosmolar coma (NKHHC): Secondary | ICD-10-CM | POA: Diagnosis not present

## 2018-03-22 DIAGNOSIS — I82612 Acute embolism and thrombosis of superficial veins of left upper extremity: Secondary | ICD-10-CM | POA: Diagnosis not present

## 2018-03-22 DIAGNOSIS — R5381 Other malaise: Secondary | ICD-10-CM | POA: Diagnosis not present

## 2018-03-22 DIAGNOSIS — I21A1 Myocardial infarction type 2: Secondary | ICD-10-CM | POA: Diagnosis present

## 2018-03-22 DIAGNOSIS — Z8674 Personal history of sudden cardiac arrest: Secondary | ICD-10-CM | POA: Diagnosis not present

## 2018-03-22 DIAGNOSIS — R29818 Other symptoms and signs involving the nervous system: Secondary | ICD-10-CM | POA: Diagnosis not present

## 2018-03-22 DIAGNOSIS — M6281 Muscle weakness (generalized): Secondary | ICD-10-CM | POA: Diagnosis not present

## 2018-03-22 DIAGNOSIS — E870 Hyperosmolality and hypernatremia: Secondary | ICD-10-CM | POA: Diagnosis not present

## 2018-03-22 DIAGNOSIS — I7100 Dissection of unspecified site of aorta: Secondary | ICD-10-CM | POA: Diagnosis not present

## 2018-03-22 DIAGNOSIS — I4892 Unspecified atrial flutter: Secondary | ICD-10-CM | POA: Diagnosis present

## 2018-03-22 DIAGNOSIS — J189 Pneumonia, unspecified organism: Secondary | ICD-10-CM | POA: Diagnosis not present

## 2018-03-22 DIAGNOSIS — R4701 Aphasia: Secondary | ICD-10-CM | POA: Diagnosis not present

## 2018-03-22 DIAGNOSIS — J9602 Acute respiratory failure with hypercapnia: Secondary | ICD-10-CM | POA: Diagnosis not present

## 2018-03-22 DIAGNOSIS — I501 Left ventricular failure: Secondary | ICD-10-CM | POA: Diagnosis not present

## 2018-03-22 DIAGNOSIS — E785 Hyperlipidemia, unspecified: Secondary | ICD-10-CM | POA: Diagnosis present

## 2018-03-22 DIAGNOSIS — D72829 Elevated white blood cell count, unspecified: Secondary | ICD-10-CM | POA: Diagnosis not present

## 2018-03-22 DIAGNOSIS — D509 Iron deficiency anemia, unspecified: Secondary | ICD-10-CM | POA: Diagnosis not present

## 2018-03-22 DIAGNOSIS — I2510 Atherosclerotic heart disease of native coronary artery without angina pectoris: Secondary | ICD-10-CM | POA: Diagnosis present

## 2018-03-22 DIAGNOSIS — J811 Chronic pulmonary edema: Secondary | ICD-10-CM | POA: Diagnosis not present

## 2018-04-11 ENCOUNTER — Encounter
Admission: RE | Admit: 2018-04-11 | Discharge: 2018-04-11 | Disposition: A | Discharge status: Home / Self Care | Payer: Medicare Other | Source: Ambulatory Visit | Attending: Internal Medicine | Admitting: Internal Medicine

## 2018-04-11 DIAGNOSIS — Z8673 Personal history of transient ischemic attack (TIA), and cerebral infarction without residual deficits: Secondary | ICD-10-CM | POA: Diagnosis not present

## 2018-04-11 DIAGNOSIS — J690 Pneumonitis due to inhalation of food and vomit: Secondary | ICD-10-CM | POA: Diagnosis not present

## 2018-04-11 DIAGNOSIS — Z7401 Bed confinement status: Secondary | ICD-10-CM | POA: Diagnosis not present

## 2018-04-11 DIAGNOSIS — E271 Primary adrenocortical insufficiency: Secondary | ICD-10-CM | POA: Diagnosis not present

## 2018-04-11 DIAGNOSIS — I69391 Dysphagia following cerebral infarction: Secondary | ICD-10-CM | POA: Diagnosis not present

## 2018-04-11 DIAGNOSIS — Z950 Presence of cardiac pacemaker: Secondary | ICD-10-CM | POA: Diagnosis not present

## 2018-04-11 DIAGNOSIS — I110 Hypertensive heart disease with heart failure: Secondary | ICD-10-CM | POA: Diagnosis not present

## 2018-04-11 DIAGNOSIS — Z9181 History of falling: Secondary | ICD-10-CM | POA: Diagnosis not present

## 2018-04-11 DIAGNOSIS — I371 Nonrheumatic pulmonary valve insufficiency: Secondary | ICD-10-CM | POA: Diagnosis not present

## 2018-04-11 DIAGNOSIS — K5909 Other constipation: Secondary | ICD-10-CM | POA: Diagnosis not present

## 2018-04-11 DIAGNOSIS — Z8674 Personal history of sudden cardiac arrest: Secondary | ICD-10-CM | POA: Diagnosis not present

## 2018-04-11 DIAGNOSIS — D638 Anemia in other chronic diseases classified elsewhere: Secondary | ICD-10-CM | POA: Diagnosis not present

## 2018-04-11 DIAGNOSIS — E0800 Diabetes mellitus due to underlying condition with hyperosmolarity without nonketotic hyperglycemic-hyperosmolar coma (NKHHC): Secondary | ICD-10-CM | POA: Diagnosis not present

## 2018-04-11 DIAGNOSIS — E1165 Type 2 diabetes mellitus with hyperglycemia: Secondary | ICD-10-CM | POA: Diagnosis not present

## 2018-04-11 DIAGNOSIS — J918 Pleural effusion in other conditions classified elsewhere: Secondary | ICD-10-CM | POA: Diagnosis not present

## 2018-04-11 DIAGNOSIS — R1312 Dysphagia, oropharyngeal phase: Secondary | ICD-10-CM | POA: Diagnosis not present

## 2018-04-11 DIAGNOSIS — E272 Addisonian crisis: Secondary | ICD-10-CM | POA: Diagnosis not present

## 2018-04-11 DIAGNOSIS — Q221 Congenital pulmonary valve stenosis: Secondary | ICD-10-CM | POA: Diagnosis not present

## 2018-04-11 DIAGNOSIS — I252 Old myocardial infarction: Secondary | ICD-10-CM | POA: Diagnosis not present

## 2018-04-11 DIAGNOSIS — E063 Autoimmune thyroiditis: Secondary | ICD-10-CM | POA: Diagnosis not present

## 2018-04-11 DIAGNOSIS — R209 Unspecified disturbances of skin sensation: Secondary | ICD-10-CM | POA: Diagnosis not present

## 2018-04-11 DIAGNOSIS — G629 Polyneuropathy, unspecified: Secondary | ICD-10-CM | POA: Diagnosis not present

## 2018-04-11 DIAGNOSIS — F339 Major depressive disorder, recurrent, unspecified: Secondary | ICD-10-CM | POA: Diagnosis not present

## 2018-04-11 DIAGNOSIS — I771 Stricture of artery: Secondary | ICD-10-CM | POA: Diagnosis not present

## 2018-04-11 DIAGNOSIS — Z7409 Other reduced mobility: Secondary | ICD-10-CM | POA: Diagnosis not present

## 2018-04-11 DIAGNOSIS — E119 Type 2 diabetes mellitus without complications: Secondary | ICD-10-CM | POA: Diagnosis not present

## 2018-04-11 DIAGNOSIS — I469 Cardiac arrest, cause unspecified: Secondary | ICD-10-CM | POA: Diagnosis not present

## 2018-04-11 DIAGNOSIS — R29898 Other symptoms and signs involving the musculoskeletal system: Secondary | ICD-10-CM | POA: Diagnosis not present

## 2018-04-11 DIAGNOSIS — R5381 Other malaise: Secondary | ICD-10-CM | POA: Diagnosis not present

## 2018-04-11 DIAGNOSIS — Z7189 Other specified counseling: Secondary | ICD-10-CM | POA: Diagnosis not present

## 2018-04-11 DIAGNOSIS — I442 Atrioventricular block, complete: Secondary | ICD-10-CM | POA: Diagnosis not present

## 2018-04-11 DIAGNOSIS — G4700 Insomnia, unspecified: Secondary | ICD-10-CM | POA: Diagnosis not present

## 2018-04-11 DIAGNOSIS — E039 Hypothyroidism, unspecified: Secondary | ICD-10-CM | POA: Diagnosis not present

## 2018-04-11 DIAGNOSIS — R262 Difficulty in walking, not elsewhere classified: Secondary | ICD-10-CM | POA: Diagnosis not present

## 2018-04-11 DIAGNOSIS — D509 Iron deficiency anemia, unspecified: Secondary | ICD-10-CM | POA: Diagnosis not present

## 2018-04-11 DIAGNOSIS — I4892 Unspecified atrial flutter: Secondary | ICD-10-CM | POA: Diagnosis not present

## 2018-04-11 DIAGNOSIS — I4891 Unspecified atrial fibrillation: Secondary | ICD-10-CM | POA: Diagnosis not present

## 2018-04-11 DIAGNOSIS — E785 Hyperlipidemia, unspecified: Secondary | ICD-10-CM | POA: Diagnosis not present

## 2018-04-11 DIAGNOSIS — M792 Neuralgia and neuritis, unspecified: Secondary | ICD-10-CM | POA: Diagnosis not present

## 2018-04-11 DIAGNOSIS — J189 Pneumonia, unspecified organism: Secondary | ICD-10-CM | POA: Diagnosis not present

## 2018-04-11 DIAGNOSIS — J9690 Respiratory failure, unspecified, unspecified whether with hypoxia or hypercapnia: Secondary | ICD-10-CM | POA: Diagnosis not present

## 2018-04-11 DIAGNOSIS — I5023 Acute on chronic systolic (congestive) heart failure: Secondary | ICD-10-CM | POA: Diagnosis not present

## 2018-04-11 DIAGNOSIS — M6281 Muscle weakness (generalized): Secondary | ICD-10-CM | POA: Diagnosis not present

## 2018-04-11 DIAGNOSIS — E038 Other specified hypothyroidism: Secondary | ICD-10-CM | POA: Diagnosis not present

## 2018-04-11 DIAGNOSIS — I480 Paroxysmal atrial fibrillation: Secondary | ICD-10-CM | POA: Diagnosis not present

## 2018-04-11 DIAGNOSIS — I82612 Acute embolism and thrombosis of superficial veins of left upper extremity: Secondary | ICD-10-CM | POA: Diagnosis not present

## 2018-04-11 DIAGNOSIS — R0602 Shortness of breath: Secondary | ICD-10-CM | POA: Diagnosis not present

## 2018-04-11 DIAGNOSIS — Z794 Long term (current) use of insulin: Secondary | ICD-10-CM | POA: Diagnosis not present

## 2018-04-11 DIAGNOSIS — E876 Hypokalemia: Secondary | ICD-10-CM | POA: Diagnosis not present

## 2018-04-11 DIAGNOSIS — F418 Other specified anxiety disorders: Secondary | ICD-10-CM | POA: Diagnosis not present

## 2018-04-11 DIAGNOSIS — R609 Edema, unspecified: Secondary | ICD-10-CM | POA: Diagnosis not present

## 2018-04-11 DIAGNOSIS — E318 Other polyglandular dysfunction: Secondary | ICD-10-CM | POA: Diagnosis not present

## 2018-04-11 DIAGNOSIS — I5022 Chronic systolic (congestive) heart failure: Secondary | ICD-10-CM | POA: Diagnosis not present

## 2018-04-11 DIAGNOSIS — E1169 Type 2 diabetes mellitus with other specified complication: Secondary | ICD-10-CM | POA: Diagnosis not present

## 2018-04-11 MED ORDER — POLYETHYLENE GLYCOL 3350 17 G PO PACK
17.00 | PACK | ORAL | Status: DC
Start: ? — End: 2018-04-11

## 2018-04-11 MED ORDER — GENERIC EXTERNAL MEDICATION
20.00 | Status: DC
Start: 2018-04-12 — End: 2018-04-11

## 2018-04-11 MED ORDER — DEXTROSE 50 % IV SOLN
12.50 | INTRAVENOUS | Status: DC
Start: ? — End: 2018-04-11

## 2018-04-11 MED ORDER — NYSTATIN 100000 UNIT/ML MT SUSP
5.00 | OROMUCOSAL | Status: DC
Start: 2018-04-11 — End: 2018-04-11

## 2018-04-11 MED ORDER — TRAZODONE HCL 50 MG PO TABS
50.00 | ORAL_TABLET | ORAL | Status: DC
Start: 2018-04-11 — End: 2018-04-11

## 2018-04-11 MED ORDER — APIXABAN 2.5 MG PO TABS
5.00 | ORAL_TABLET | ORAL | Status: DC
Start: 2018-04-11 — End: 2018-04-11

## 2018-04-11 MED ORDER — MELATONIN 3 MG PO TABS
6.00 | ORAL_TABLET | ORAL | Status: DC
Start: ? — End: 2018-04-11

## 2018-04-11 MED ORDER — HYDROCORTISONE 10 MG PO TABS
10.00 | ORAL_TABLET | ORAL | Status: DC
Start: 2018-04-12 — End: 2018-04-11

## 2018-04-11 MED ORDER — FLUOXETINE HCL 10 MG PO CAPS
10.00 | ORAL_CAPSULE | ORAL | Status: DC
Start: 2018-04-12 — End: 2018-04-11

## 2018-04-11 MED ORDER — ASPIRIN 81 MG PO CHEW
81.00 | CHEWABLE_TABLET | ORAL | Status: DC
Start: 2018-04-12 — End: 2018-04-11

## 2018-04-11 MED ORDER — INSULIN GLARGINE 100 UNIT/ML ~~LOC~~ SOLN
12.00 | SUBCUTANEOUS | Status: DC
Start: ? — End: 2018-04-11

## 2018-04-11 MED ORDER — ATORVASTATIN CALCIUM 40 MG PO TABS
40.00 | ORAL_TABLET | ORAL | Status: DC
Start: 2018-04-11 — End: 2018-04-11

## 2018-04-11 MED ORDER — INSULIN LISPRO 100 UNIT/ML ~~LOC~~ SOLN
8.00 | SUBCUTANEOUS | Status: DC
Start: 2018-04-11 — End: 2018-04-11

## 2018-04-11 MED ORDER — ACETAMINOPHEN 160 MG/5ML PO SUSP
650.00 | ORAL | Status: DC
Start: ? — End: 2018-04-11

## 2018-04-11 MED ORDER — HYDROCORTISONE 10 MG PO TABS
20.00 | ORAL_TABLET | ORAL | Status: DC
Start: 2018-04-12 — End: 2018-04-11

## 2018-04-11 MED ORDER — SENNOSIDES-DOCUSATE SODIUM 8.6-50 MG PO TABS
2.00 | ORAL_TABLET | ORAL | Status: DC
Start: ? — End: 2018-04-11

## 2018-04-11 MED ORDER — FUROSEMIDE 40 MG PO TABS
40.00 | ORAL_TABLET | ORAL | Status: DC
Start: 2018-04-12 — End: 2018-04-11

## 2018-04-11 MED ORDER — GENERIC EXTERNAL MEDICATION
125.00 | Status: DC
Start: 2018-04-12 — End: 2018-04-11

## 2018-04-11 MED ORDER — FLUDROCORTISONE ACETATE 0.1 MG PO TABS
0.05 | ORAL_TABLET | ORAL | Status: DC
Start: 2018-04-12 — End: 2018-04-11

## 2018-04-11 MED ORDER — INSULIN LISPRO 100 UNIT/ML ~~LOC~~ SOLN
0.00 | SUBCUTANEOUS | Status: DC
Start: 2018-04-11 — End: 2018-04-11

## 2018-04-11 MED ORDER — GENERIC EXTERNAL MEDICATION
1.00 | Status: DC
Start: ? — End: 2018-04-11

## 2018-04-12 DIAGNOSIS — E0800 Diabetes mellitus due to underlying condition with hyperosmolarity without nonketotic hyperglycemic-hyperosmolar coma (NKHHC): Secondary | ICD-10-CM | POA: Diagnosis not present

## 2018-04-12 DIAGNOSIS — I469 Cardiac arrest, cause unspecified: Secondary | ICD-10-CM | POA: Diagnosis not present

## 2018-04-12 DIAGNOSIS — I5023 Acute on chronic systolic (congestive) heart failure: Secondary | ICD-10-CM | POA: Diagnosis not present

## 2018-04-12 DIAGNOSIS — I4891 Unspecified atrial fibrillation: Secondary | ICD-10-CM | POA: Diagnosis not present

## 2018-04-12 DIAGNOSIS — R29898 Other symptoms and signs involving the musculoskeletal system: Secondary | ICD-10-CM | POA: Diagnosis not present

## 2018-04-12 DIAGNOSIS — J690 Pneumonitis due to inhalation of food and vomit: Secondary | ICD-10-CM | POA: Diagnosis not present

## 2018-04-12 DIAGNOSIS — E271 Primary adrenocortical insufficiency: Secondary | ICD-10-CM | POA: Diagnosis not present

## 2018-04-16 ENCOUNTER — Non-Acute Institutional Stay (SKILLED_NURSING_FACILITY): Payer: Medicare Other | Admitting: Adult Health

## 2018-04-16 DIAGNOSIS — I5022 Chronic systolic (congestive) heart failure: Secondary | ICD-10-CM | POA: Diagnosis not present

## 2018-04-16 DIAGNOSIS — G629 Polyneuropathy, unspecified: Secondary | ICD-10-CM

## 2018-04-16 DIAGNOSIS — E271 Primary adrenocortical insufficiency: Secondary | ICD-10-CM | POA: Diagnosis not present

## 2018-04-17 ENCOUNTER — Encounter: Payer: Self-pay | Admitting: Adult Health

## 2018-04-17 ENCOUNTER — Other Ambulatory Visit
Admission: RE | Admit: 2018-04-17 | Discharge: 2018-04-17 | Disposition: A | Discharge status: Home / Self Care | Payer: Medicare Other | Source: Ambulatory Visit | Attending: Internal Medicine | Admitting: Internal Medicine

## 2018-04-18 ENCOUNTER — Other Ambulatory Visit
Admission: RE | Admit: 2018-04-18 | Discharge: 2018-04-18 | Disposition: A | Discharge status: Home / Self Care | Payer: Medicare Other | Source: Ambulatory Visit | Attending: Internal Medicine | Admitting: Internal Medicine

## 2018-04-18 DIAGNOSIS — R609 Edema, unspecified: Secondary | ICD-10-CM | POA: Insufficient documentation

## 2018-04-18 LAB — BASIC METABOLIC PANEL
ANION GAP: 10 (ref 5–15)
BUN: 11 mg/dL (ref 8–23)
CHLORIDE: 98 mmol/L (ref 98–111)
CO2: 34 mmol/L — AB (ref 22–32)
Calcium: 8.5 mg/dL — ABNORMAL LOW (ref 8.9–10.3)
Creatinine, Ser: 0.53 mg/dL (ref 0.44–1.00)
GFR calc non Af Amer: >60 mL/min (ref >60)
Glucose, Bld: 38 mg/dL — CL (ref 70–99)
POTASSIUM: 2.5 mmol/L — AB (ref 3.5–5.1)
Sodium: 142 mmol/L (ref 135–145)

## 2018-04-19 ENCOUNTER — Non-Acute Institutional Stay (SKILLED_NURSING_FACILITY): Payer: Medicare Other | Admitting: Adult Health

## 2018-04-19 ENCOUNTER — Encounter: Payer: Self-pay | Admitting: Adult Health

## 2018-04-19 DIAGNOSIS — E1169 Type 2 diabetes mellitus with other specified complication: Secondary | ICD-10-CM | POA: Diagnosis not present

## 2018-04-19 DIAGNOSIS — E039 Hypothyroidism, unspecified: Secondary | ICD-10-CM

## 2018-04-19 DIAGNOSIS — I5022 Chronic systolic (congestive) heart failure: Secondary | ICD-10-CM | POA: Diagnosis not present

## 2018-04-19 DIAGNOSIS — K5909 Other constipation: Secondary | ICD-10-CM | POA: Diagnosis not present

## 2018-04-19 DIAGNOSIS — R1312 Dysphagia, oropharyngeal phase: Secondary | ICD-10-CM

## 2018-04-19 DIAGNOSIS — F339 Major depressive disorder, recurrent, unspecified: Secondary | ICD-10-CM | POA: Diagnosis not present

## 2018-04-19 DIAGNOSIS — E271 Primary adrenocortical insufficiency: Secondary | ICD-10-CM

## 2018-04-19 DIAGNOSIS — E785 Hyperlipidemia, unspecified: Secondary | ICD-10-CM

## 2018-04-19 DIAGNOSIS — Z794 Long term (current) use of insulin: Secondary | ICD-10-CM

## 2018-04-19 DIAGNOSIS — E1165 Type 2 diabetes mellitus with hyperglycemia: Secondary | ICD-10-CM | POA: Diagnosis not present

## 2018-04-19 NOTE — Progress Notes (Signed)
[facility]:   [facility] Room Number: [place] of Service:  SNF (31)   CODE STATUS: Full Code  Allergies  Allergen Reactions  . Oxycodone Hcl     REACTION: hallucinations  . Albuterol Anxiety    Other reaction(s): Other (See Comments) Tremors   . Oxycodone Hcl Other (See Comments)    halucinations    Chief Complaint  Patient presents with  . Medical Management of Chronic Issues    Chronic systolic heart failure; diabetes; hyperlipidemia; hypothyroidism weekly follow up for the first 30 days post hospitalization.     HPI:  She is a 67 year old short term rehab patient being seen for the management of her chronic illnesses: chronic systolic heart failure; diabetes; hyperlipidemia; hypothyroidism. She is complaining of shortness of breath; no cough; no chest pain; 02 sat is normal. She is on long term eliquis. There are no reports of fevers present.   Past Medical History:  Diagnosis Date  . Asthma   . Chicken pox   . Depression   . Diabetes mellitus   . Frequent UTI   . GERD (gastroesophageal reflux disease)   . Heart disease   . Heart murmur   . 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 Hx of colonic polyps   . Hyperhidrosis    especially with hot flashes    . Hyperlipidemia   . Lipoma    R axilla   . MRSA cellulitis    surgical wound infection  . Osteoporosis   . Pyelocystitis    as a child   . Thyroid disease   . Urinary incontinence     Past Surgical History:  Procedure Laterality Date  . ABDOMINAL HYSTERECTOMY  1970  . [place], 2005   pulmonary stenosis with valve replacement, Dr. Evelina Dun  . PARTIAL HYSTERECTOMY  1970  . PULMONARY VEIN STENOSIS REPAIR    . VALVE REPLACEMENT      Social History   Socioeconomic History  . Marital status: Married    Spouse name: Not on file  . Number of children: 2  . Years of education: Not on file  . Highest education level: Not on file    Occupational History  . Occupation: Retired    Fish farm manager: RETIRED  . Occupation: Press photographer   Social Needs  . Financial resource strain: Not on file  . Food insecurity:    Worry: Never true    Inability: Never true  . Transportation needs:    Medical: No    Non-medical: No  Tobacco Use  . Smoking status: Never Smoker  . Smokeless tobacco: Never Used  Substance and Sexual Activity  . Alcohol use: Yes    Comment: occasional wine   . Drug use: No  . Sexual activity: Yes  Lifestyle  . Physical activity:    Days per week: 0 days    Minutes per session: 0 min  . Stress: Only a little  Relationships  . Social connections:    Talks on phone: Patient refused    Gets together: Patient refused    Attends religious service: Patient refused    Active member of club or organization: Patient refused    Attends meetings of clubs or organizations: Patient refused    Relationship status: Patient refused  . Intimate partner violence:    Fear of current or ex partner: No    Emotionally abused: No    Physically abused: No  Forced sexual activity: No  Other Topics Concern  . Not on file  Social History Narrative   Divorced x 2      Sister is pt here- [employer]or, minerals      Cares for 52 year old mother      52 for daughter with Down's syndrome      Adopted son with Asberger's syndrome      G1P1      Cares for 2 disabled children    Family History  Problem Relation Age of Onset  . Coarctation of the aorta Sister   . Diabetes Mother   . COPD Mother   . Obesity Mother   . Depression Mother   . Pancreatic cancer Mother   . Alcohol abuse Father   . Cirrhosis Father   . Down syndrome Brother   . Pneumonia Brother   . Down syndrome Daughter   . Lupus Daughter   . Ulcerative colitis Daughter       VITAL SIGNS BP (!) 148/72   Pulse 86   Temp 97.9 F (36.6 C)   Resp 20   Ht 5\' 1"  (1.549 m)   Wt 142 lb 4.8 oz (64.5 kg)   SpO2 93%   BMI 26.89 kg/m   Outpatient  Encounter Medications as of 04/19/2018  Medication Sig  . acetaminophen (TYLENOL) 160 MG/5ML elixir Take by mouth every 6 (six) hours as needed for fever. 20.3 mL of 160 mg/100mL  . apixaban (ELIQUIS) 5 MG TABS tablet Take 5 mg by mouth 2 (two) times daily.  Marland Kitchen aspirin 81 MG chewable tablet Chew 81 mg by mouth daily.  Marland Kitchen atorvastatin (LIPITOR) 40 MG tablet Take 40 mg by mouth daily.  . Dermatological Products, Misc. (ITCH-ENDER! EX) Endit cream - Apply every shift and as needed  . fludrocortisone (FLORINEF) 0.1 MG tablet Take 0.05 mg by mouth daily.   Marland Kitchen FLUoxetine (PROZAC) 10 MG tablet TAKE 1 TABLET BY MOUTH ONCE DAILY  . furosemide (LASIX) 40 MG tablet Take 40 mg by mouth daily.  Marland Kitchen glucose blood (ONE TOUCH ULTRA TEST) test strip USE ONE STRIP TO CHECK GLUCOSE THREE TIMES DAILY AS DIRECTED E11.65  . guaifenesin (ROBITUSSIN) 100 MG/5ML syrup Take 200 mg by mouth every 4 (four) hours as needed for cough.  . hydrocortisone (CORTEF) 10 MG tablet Take 10 mg by mouth daily. Take at 1 PM  . hydrocortisone (CORTEF) 20 MG tablet Take 20 mg by mouth daily. Take at 8 AM  . insulin glargine (LANTUS) 100 UNIT/ML injection Inject 16 Units into the skin at bedtime.  . insulin lispro (HUMALOG) 100 UNIT/ML injection Inject 1-4 Units into the skin 3 (three) times daily before meals. SSI:  0-200 = 0 units, 201-250 = 1 unit, 251-300 = 2 units, 301-350 = 3 units, >350 = 4 units and call MD  . levothyroxine (SYNTHROID, LEVOTHROID) 125 MCG tablet TAKE ONE TABLET BY MOUTH ONCE DAILY  . Melatonin 3 MG TABS Take 6 mg by mouth. Take 2 tablets to = 6 mg  . metFORMIN (GLUCOPHAGE) 500 MG tablet Take by mouth 2 (two) times daily with a meal.  . NON FORMULARY Diet Type:  NCS, Honey Thick Liquids, Mechanical soft Chopped Meat  . nystatin (MYCOSTATIN) 100000 UNIT/ML suspension Take 5 mLs by mouth 4 (four) times daily.  . polyethylene glycol (MIRALAX / GLYCOLAX) packet Take 17 g by mouth daily as needed for mild constipation.  .  potassium chloride (K-DUR) 10 MEQ tablet Take 10 mEq by  mouth daily.  . Prasterone, DHEA, 25 MG TABS Take 50 mg by mouth daily. Take 2 tablets to = 50 mg  . senna-docusate (SENOKOT-S) 8.6-50 MG tablet Take 2 tablets by mouth daily.  . traZODone (DESYREL) 50 MG tablet Take 50 mg by mouth at bedtime.  . [DISCONTINUED] hydrocortisone (CORTEF) 10 MG tablet Take 10 mg by mouth daily.   No facility-administered encounter medications on file as of 04/19/2018.      SIGNIFICANT DIAGNOSTIC EXAMS  LABS REVIEWED TODAY:   01-17-18: hgb a1c  8.4 tsh 3.54 04-18-18: glucose 38; bun 11  creat 0.53 k+ 2.5; na++ 142 ca 8.5   Review of Systems  Constitutional: Negative for malaise/fatigue.  Respiratory: Positive for shortness of breath. Negative for cough.        Shortness of breath   Cardiovascular: Negative for chest pain, palpitations and leg swelling.  Gastrointestinal: Negative for abdominal pain, constipation and heartburn.  Musculoskeletal: Negative for back pain, joint pain and myalgias.  Skin: Negative.   Neurological: Negative for dizziness.  Psychiatric/Behavioral: The patient is not nervous/anxious.     Physical Exam  Constitutional: She is oriented to person, place, and time. She appears well-developed and well-nourished. No distress.  Neck: No thyromegaly present.  Cardiovascular: Normal rate, regular rhythm, normal heart sounds and intact distal pulses.  Pulmonary/Chest: Effort normal and breath sounds normal. No respiratory distress.  Abdominal: Soft. Bowel sounds are normal. She exhibits no distension. There is no tenderness.  Musculoskeletal: Normal range of motion. She exhibits no edema.  Lymphadenopathy:    She has no cervical adenopathy.  Neurological: She is alert and oriented to person, place, and time.  Skin: Skin is warm and dry. She is not diaphoretic.  Psychiatric: She has a normal mood and affect.     ASSESSMENT/ PLAN:  TODAY:   1. Chronic atrial fibrillation:  heart rate is stable will continue eliquis 5 mg twice daily   2.  Chronic systolic heart failure: is stable EF 55% (04-03-18) will continue lasix 40 mg daily   3. Hypokalemia: is worse k+ 2.5; will give k+ 40 meq twice today will then begin 20 meq daily   4.  Type 2 diabetes mellitus with hyperglycemia with long term current use of insulin: hgb 8.4; is hypoglycemic this AM 38 will lower lantus to 12 units nightly and will continue Humalog SSI: 201-250: 1 unit; 251-300: 2 units; 301-350: 3 units; >250 4 units metformin 500 mg twice daily   5. Hyperlipidemia associated with type 2 diabetes mellitus: is stable will continue lipitor 40 mg daily  6. Acquired hypothyroidism: stable tsh 3.54; will continue synthroid 125 mcg daily   7. Dysphagia oropharyngeal is without signs of aspiration present: will continue honey thick liquids  8. Chronic constipation: is stable will continue senna s 2 tabs daily   9. Depression, recurrent: is stable will continue prozac 10 mg daily and trazodone 50 mg nightly   10.  Adrenal insufficiency (Addisons disease) is stable will continue florinef 0.05 mg daily cortef 20 mg in the AM and 10 mg at 1 PM.   Will get chest x-ray:     MD is aware of resident's narcotic use and is in agreement with current plan of care. We will attempt to wean resident as apropriate   Ok Edwards NP [facility]  Contact [phone] Monday through Friday 8am- 5pm  After hours call [phone]

## 2018-04-20 ENCOUNTER — Encounter: Payer: Self-pay | Admitting: Adult Health

## 2018-04-20 ENCOUNTER — Non-Acute Institutional Stay (SKILLED_NURSING_FACILITY): Payer: Medicare Other | Admitting: Adult Health

## 2018-04-20 ENCOUNTER — Other Ambulatory Visit
Admission: RE | Admit: 2018-04-20 | Discharge: 2018-04-20 | Disposition: A | Discharge status: Home / Self Care | Payer: No Typology Code available for payment source | Source: Skilled Nursing Facility | Attending: Adult Health | Admitting: Adult Health

## 2018-04-20 DIAGNOSIS — Z794 Long term (current) use of insulin: Secondary | ICD-10-CM

## 2018-04-20 DIAGNOSIS — I5022 Chronic systolic (congestive) heart failure: Secondary | ICD-10-CM | POA: Diagnosis not present

## 2018-04-20 DIAGNOSIS — J189 Pneumonia, unspecified organism: Secondary | ICD-10-CM

## 2018-04-20 DIAGNOSIS — E1165 Type 2 diabetes mellitus with hyperglycemia: Secondary | ICD-10-CM

## 2018-04-20 LAB — BASIC METABOLIC PANEL
ANION GAP: 11 (ref 5–15)
BUN: 10 mg/dL (ref 8–23)
CHLORIDE: 103 mmol/L (ref 98–111)
CO2: 30 mmol/L (ref 22–32)
CREATININE: 0.51 mg/dL (ref 0.44–1.00)
Calcium: 8.2 mg/dL — ABNORMAL LOW (ref 8.9–10.3)
GFR calc non Af Amer: >60 mL/min (ref >60)
Glucose, Bld: 31 mg/dL — CL (ref 70–99)
POTASSIUM: 3.9 mmol/L (ref 3.5–5.1)
SODIUM: 144 mmol/L (ref 135–145)

## 2018-04-20 NOTE — Progress Notes (Signed)
[facility]:   [facility] Room Number: [place] of Service:  SNF (31)   CODE STATUS: Full Code  Allergies  Allergen Reactions  . Oxycodone Hcl     REACTION: hallucinations  . Albuterol Anxiety    Other reaction(s): Other (See Comments) Tremors   . Oxycodone Hcl Other (See Comments)    halucinations    Chief Complaint  Patient presents with  . Acute Visit    Follow up chest xray    HPI:  Her chest x-ray demonstrates worsening chf and pneumonia. Her cbg is critically low this AM at 31. She does have a cough and shortness of breath. She denies any chest pain; there are reports of fevers present in the past 24 hours.   Past Medical History:  Diagnosis Date  . Asthma   . Chicken pox   . Depression   . Diabetes mellitus   . Frequent UTI   . GERD (gastroesophageal reflux disease)   . Heart disease   . Heart murmur   . 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 Hx of colonic polyps   . Hyperhidrosis    especially with hot flashes    . Hyperlipidemia   . Lipoma    R axilla   . MRSA cellulitis    surgical wound infection  . Osteoporosis   . Pyelocystitis    as a child   . Thyroid disease   . Urinary incontinence     Past Surgical History:  Procedure Laterality Date  . ABDOMINAL HYSTERECTOMY  1970  . [place], 2005   pulmonary stenosis with valve replacement, Dr. Evelina Dun  . PARTIAL HYSTERECTOMY  1970  . PULMONARY VEIN STENOSIS REPAIR    . VALVE REPLACEMENT      Social History   Socioeconomic History  . Marital status: Married    Spouse name: Not on file  . Number of children: 2  . Years of education: Not on file  . Highest education level: Not on file  Occupational History  . Occupation: Retired    Fish farm manager: RETIRED  . Occupation: Press photographer   Social Needs  . Financial resource strain: Not on file  . Food insecurity:    Worry: Never true    Inability: Never true  . Transportation needs:   Medical: No    Non-medical: No  Tobacco Use  . Smoking status: Never Smoker  . Smokeless tobacco: Never Used  Substance and Sexual Activity  . Alcohol use: Yes    Comment: occasional wine   . Drug use: No  . Sexual activity: Yes  Lifestyle  . Physical activity:    Days per week: 0 days    Minutes per session: 0 min  . Stress: Only a little  Relationships  . Social connections:    Talks on phone: Patient refused    Gets together: Patient refused    Attends religious service: Patient refused    Active member of club or organization: Patient refused    Attends meetings of clubs or organizations: Patient refused    Relationship status: Patient refused  . Intimate partner violence:    Fear of current or ex partner: No    Emotionally abused: No    Physically abused: No    Forced sexual activity: No  Other Topics Concern  . Not on file  Social History Narrative   Divorced x 2      Sister is pt  here- [place] for 58 year old mother      73 for daughter with Down's syndrome      Adopted son with Asberger's syndrome      G1P1      Cares for 2 disabled children    Family History  Problem Relation Age of Onset  . Coarctation of the aorta Sister   . Diabetes Mother   . COPD Mother   . Obesity Mother   . Depression Mother   . Pancreatic cancer Mother   . Alcohol abuse Father   . Cirrhosis Father   . Down syndrome Brother   . Pneumonia Brother   . Down syndrome Daughter   . Lupus Daughter   . Ulcerative colitis Daughter       VITAL SIGNS BP (!) 112/54   Pulse 76   Temp 98 F (36.7 C)   Resp 17   Ht 5\' 1"  (1.549 m)   Wt 141 lb 11.2 oz (64.3 kg)   SpO2 95%   BMI 26.77 kg/m   Outpatient Encounter Medications as of 04/20/2018  Medication Sig  . acetaminophen (TYLENOL) 160 MG/5ML elixir Take by mouth every 6 (six) hours as needed for fever. 20.3 mL of 160 mg/34mL  . apixaban (ELIQUIS) 5 MG TABS tablet Take 5 mg by mouth 2 (two) times daily.   Marland Kitchen aspirin 81 MG chewable tablet Chew 81 mg by mouth daily.  Marland Kitchen atorvastatin (LIPITOR) 40 MG tablet Take 40 mg by mouth daily.  . fludrocortisone (FLORINEF) 0.1 MG tablet Take 0.05 mg by mouth daily.   Marland Kitchen FLUoxetine (PROZAC) 10 MG tablet TAKE 1 TABLET BY MOUTH ONCE DAILY  . furosemide (LASIX) 40 MG tablet Take 40 mg by mouth daily.  Marland Kitchen glucose blood (ONE TOUCH ULTRA TEST) test strip USE ONE STRIP TO CHECK GLUCOSE THREE TIMES DAILY AS DIRECTED E11.65  . hydrocortisone (CORTEF) 10 MG tablet Take 10 mg by mouth daily. Take at 1 PM  . hydrocortisone (CORTEF) 20 MG tablet Take 20 mg by mouth daily. Take at 8 AM  . Insulin Glargine (LANTUS SOLOSTAR) 100 UNIT/ML Solostar Pen Inject 16 Units into the skin at bedtime.  . insulin lispro (HUMALOG) 100 UNIT/ML injection Inject 1-4 Units into the skin 3 (three) times daily before meals. SSI:  0-200 = 0 units, 201-250 = 1 unit, 251-300 = 2 units, 301-350 = 3 units, >350 = 4 units and call MD  . levothyroxine (SYNTHROID, LEVOTHROID) 125 MCG tablet TAKE ONE TABLET BY MOUTH ONCE DAILY  . Melatonin 3 MG TABS Take 6 mg by mouth. Take 2 tablets to = 6 mg  . metFORMIN (GLUCOPHAGE) 500 MG tablet Take by mouth 2 (two) times daily with a meal.  . NON FORMULARY Diet Type:  NCS, Honey Thick Liquids, Mechanical soft Chopped Meat  . nystatin (MYCOSTATIN) 100000 UNIT/ML suspension Take 5 mLs by mouth 4 (four) times daily.  . polyethylene glycol (MIRALAX / GLYCOLAX) packet Take 17 g by mouth daily as needed for mild constipation.  . Potassium Chloride ER 20 MEQ TBCR Take 20 mEq by mouth daily.   . Prasterone, DHEA, 25 MG TABS Take 50 mg by mouth daily. Take 2 tablets to = 50 mg  . senna-docusate (SENOKOT-S) 8.6-50 MG tablet Take 2 tablets by mouth daily.  . Skin Protectants, Misc. (ENDIT EX) Apply every shift and as needed  . traZODone (DESYREL) 50 MG tablet Take 50 mg by mouth at bedtime.  . [  [place], Misc. (ITCH-ENDER! EX) Endit cream - Apply  every shift and as needed  . [DISCONTINUED] guaifenesin (ROBITUSSIN) 100 MG/5ML syrup Take 200 mg by mouth every 4 (four) hours as needed for cough.  . [DISCONTINUED] insulin glargine (LANTUS) 100 UNIT/ML injection Inject 16 Units into the skin at bedtime.   No facility-administered encounter medications on file as of 04/20/2018.      SIGNIFICANT DIAGNOSTIC EXAMS  TODAY:   04-19-18: chest x-ray: 1. Cardiomegaly with elevated pulmonary vasculature and patchy interstitial changes suggest congestive heart failure with probable interstitial pulmonary edema. Pneumonia could produce a similar picture  LABS REVIEWED PREVIOUS :   01-17-18: hgb a1c  8.4 tsh 3.54 04-18-18: glucose 38; bun 11  creat 0.53 k+ 2.5; na++ 142 ca 8.5  TODAY   04-20-18: glucose 31; bun 10; creat 0.51; k+ 3.9; na++ 144; ca 8.2   Review of Systems  Constitutional: Negative for malaise/fatigue.  Respiratory: Positive for cough and shortness of breath.   Cardiovascular: Negative for chest pain, palpitations and leg swelling.  Gastrointestinal: Negative for abdominal pain, constipation and heartburn.  Musculoskeletal: Negative for back pain, joint pain and myalgias.  Skin: Negative.   Neurological: Negative for dizziness.  Psychiatric/Behavioral: The patient is not nervous/anxious.     Physical Exam  Constitutional: She is oriented to person, place, and time. She appears well-developed and well-nourished. No distress.  Neck: No thyromegaly present.  Cardiovascular: Normal rate, regular rhythm, normal heart sounds and intact distal pulses.  Pulmonary/Chest: Effort normal. No respiratory distress.  Breath sounds dminished   Abdominal: Soft. Bowel sounds are normal. She exhibits no distension. There is no tenderness.  Musculoskeletal: Normal range of motion. She exhibits no edema.  Lymphadenopathy:    She has no cervical adenopathy.  Neurological: She is alert and oriented to person, place, and time.  Skin: Skin is  warm and dry. She is not diaphoretic.  Psychiatric: She has a normal mood and affect.     ASSESSMENT/ PLAN:  TODAY:   1. Chronic systolic heart failure: worse 2. Type 2 diabetes mellitus with hyperglycemia with long term current use of insulin: worse   Will stop lantus at this time  Will give an extra lasix 40 mg daily for 3 days with k+ 20 meq for 3 days Will begin levaquin 750 mg daily from 04-20-18 through 04-29-18.   MD is aware of resident's narcotic use and is in agreement with current plan of care. We will attempt to wean resident as apropriate   Ok Edwards NP [facility]  Contact [phone] Monday through Friday 8am- 5pm  After hours call [phone]

## 2018-04-23 ENCOUNTER — Encounter: Payer: Self-pay | Admitting: Adult Health

## 2018-04-23 ENCOUNTER — Non-Acute Institutional Stay (SKILLED_NURSING_FACILITY): Payer: Medicare Other | Admitting: Adult Health

## 2018-04-23 ENCOUNTER — Other Ambulatory Visit
Admission: RE | Admit: 2018-04-23 | Discharge: 2018-04-23 | Disposition: A | Discharge status: Home / Self Care | Payer: No Typology Code available for payment source | Source: Ambulatory Visit | Attending: Adult Health | Admitting: Adult Health

## 2018-04-23 DIAGNOSIS — E876 Hypokalemia: Secondary | ICD-10-CM | POA: Diagnosis not present

## 2018-04-23 DIAGNOSIS — M792 Neuralgia and neuritis, unspecified: Secondary | ICD-10-CM | POA: Diagnosis not present

## 2018-04-23 DIAGNOSIS — I5022 Chronic systolic (congestive) heart failure: Secondary | ICD-10-CM | POA: Insufficient documentation

## 2018-04-23 LAB — CBC WITH DIFFERENTIAL/PLATELET
Abs Immature Granulocytes: 0.06 10*3/uL (ref 0.00–0.07)
BASOS PCT: 1 %
Basophils Absolute: 0 10*3/uL (ref 0.0–0.1)
EOS PCT: 3 %
Eosinophils Absolute: 0.2 10*3/uL (ref 0.0–0.5)
HEMATOCRIT: 34.1 % — AB (ref 36.0–46.0)
HEMOGLOBIN: 10.5 g/dL — AB (ref 12.0–15.0)
Immature Granulocytes: 1 %
Lymphocytes Relative: 33 %
Lymphs Abs: 2.4 10*3/uL (ref 0.7–4.0)
MCH: 29.4 pg (ref 26.0–34.0)
MCHC: 30.8 g/dL (ref 30.0–36.0)
MCV: 95.5 fL (ref 80.0–100.0)
MONO ABS: 0.7 10*3/uL (ref 0.1–1.0)
Monocytes Relative: 9 %
NEUTROS ABS: 4 10*3/uL (ref 1.7–7.7)
NRBC: 0 % (ref 0.0–0.2)
Neutrophils Relative %: 53 %
Platelets: 193 10*3/uL (ref 150–400)
RBC: 3.57 MIL/uL — ABNORMAL LOW (ref 3.87–5.11)
RDW: 16.3 % — ABNORMAL HIGH (ref 11.5–15.5)
WBC: 7.3 10*3/uL (ref 4.0–10.5)

## 2018-04-23 LAB — BASIC METABOLIC PANEL
Anion gap: 12 (ref 5–15)
BUN: 15 mg/dL (ref 8–23)
CHLORIDE: 94 mmol/L — AB (ref 98–111)
CO2: 33 mmol/L — AB (ref 22–32)
CREATININE: 0.74 mg/dL (ref 0.44–1.00)
Calcium: 8.3 mg/dL — ABNORMAL LOW (ref 8.9–10.3)
GFR calc Af Amer: >60 mL/min (ref >60)
GFR calc non Af Amer: >60 mL/min (ref >60)
GLUCOSE: 158 mg/dL — AB (ref 70–99)
POTASSIUM: 3.2 mmol/L — AB (ref 3.5–5.1)
Sodium: 139 mmol/L (ref 135–145)

## 2018-04-23 NOTE — Progress Notes (Signed)
Location:   [facility] Room Number: [place] of Service:  SNF ([id number])   CODE STATUS: Full Code  Allergies  Allergen Reactions  . Oxycodone Hcl     REACTION: hallucinations  . Albuterol Anxiety    Other reaction(s): Other (See Comments) Tremors   . Oxycodone Hcl Other (See Comments)    halucinations    Chief Complaint  Patient presents with  . Acute Visit    Lab Follow up    HPI:  Her edema is much less. She is complaining that her hands and feet are very cold. She has burning pain in both hands with the left worse than right. She denies any muscle cramping. She denies any chest pain or shortness of breath.   Past Medical History:  Diagnosis Date  . Asthma   . Chicken pox   . Depression   . Diabetes mellitus   . Frequent UTI   . GERD (gastroesophageal reflux disease)   . Heart disease   . Heart murmur   . 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 Hx of colonic polyps   . Hyperhidrosis    especially with hot flashes    . Hyperlipidemia   . Lipoma    R axilla   . MRSA cellulitis    surgical wound infection  . Osteoporosis   . Pyelocystitis    as a child   . Thyroid disease   . Urinary incontinence     Past Surgical History:  Procedure Laterality Date  . ABDOMINAL HYSTERECTOMY  1970  . [place], 2005   pulmonary stenosis with valve replacement, Dr. Evelina Dun  . PARTIAL HYSTERECTOMY  1970  . PULMONARY VEIN STENOSIS REPAIR    . VALVE REPLACEMENT      Social History   Socioeconomic History  . Marital status: Married    Spouse name: Not on file  . Number of children: 2  . Years of education: Not on file  . Highest education level: Not on file  Occupational History  . Occupation: Retired    Fish farm manager: RETIRED  . Occupation: Press photographer   Social Needs  . Financial resource strain: Not on file  . Food insecurity:    Worry: Never true    Inability: Never true  . Transportation needs:   Medical: No    Non-medical: No  Tobacco Use  . Smoking status: Never Smoker  . Smokeless tobacco: Never Used  Substance and Sexual Activity  . Alcohol use: Yes    Comment: occasional wine   . Drug use: No  . Sexual activity: Yes  Lifestyle  . Physical activity:    Days per week: 0 days    Minutes per session: 0 min  . Stress: Only a little  Relationships  . Social connections:    Talks on phone: Patient refused    Gets together: Patient refused    Attends religious service: Patient refused    Active member of club or organization: Patient refused    Attends meetings of clubs or organizations: Patient refused    Relationship status: Patient refused  . Intimate partner violence:    Fear of current or ex partner: No    Emotionally abused: No    Physically abused: No    Forced sexual activity: No  Other Topics Concern  . Not on file  Social History Narrative   Divorced x 2      Sister is pt  here- [place] for 49 year old mother      62 for daughter with Down's syndrome      Adopted son with Asberger's syndrome      G1P1      Cares for 2 disabled children    Family History  Problem Relation Age of Onset  . Coarctation of the aorta Sister   . Diabetes Mother   . COPD Mother   . Obesity Mother   . Depression Mother   . Pancreatic cancer Mother   . Alcohol abuse Father   . Cirrhosis Father   . Down syndrome Brother   . Pneumonia Brother   . Down syndrome Daughter   . Lupus Daughter   . Ulcerative colitis Daughter       VITAL SIGNS BP (!) 99/55   Pulse 70   Temp 98.1 F (36.7 C)   Resp 18   Ht 5\' 1"  (1.549 m)   Wt 131 lb 14.4 oz (59.8 kg)   SpO2 97%   BMI 24.92 kg/m   Outpatient Encounter Medications as of 04/23/2018  Medication Sig  . acetaminophen (TYLENOL) 160 MG/5ML elixir Take by mouth every 6 (six) hours as needed for fever. 20.3 mL of 160 mg/63mL  . apixaban (ELIQUIS) 5 MG TABS tablet Take 5 mg by mouth 2 (two) times  daily.  Marland Kitchen aspirin 81 MG tablet Take 81 mg by mouth daily.   Marland Kitchen atorvastatin (LIPITOR) 40 MG tablet Take 40 mg by mouth daily.  . fludrocortisone (FLORINEF) 0.1 MG tablet Take 0.05 mg by mouth daily.   Marland Kitchen FLUoxetine (PROZAC) 10 MG tablet TAKE 1 TABLET BY MOUTH ONCE DAILY  . furosemide (LASIX) 40 MG tablet Take 40 mg by mouth daily.  Marland Kitchen glucose blood (ONE TOUCH ULTRA TEST) test strip USE ONE STRIP TO CHECK GLUCOSE THREE TIMES DAILY AS DIRECTED E11.65  . hydrocortisone (CORTEF) 10 MG tablet Take 10 mg by mouth daily. Take at 1 PM  . hydrocortisone (CORTEF) 20 MG tablet Take 20 mg by mouth daily. Take at 8 AM  . insulin lispro (HUMALOG) 100 UNIT/ML injection Inject 1-4 Units into the skin 3 (three) times daily before meals. SSI:  0-200 = 0 units, 201-250 = 1 unit, 251-300 = 2 units, 301-350 = 3 units, >350 = 4 units and call MD  . levofloxacin (LEVAQUIN) 750 MG tablet Take 750 mg by mouth daily.  Marland Kitchen levothyroxine (SYNTHROID, LEVOTHROID) 125 MCG tablet TAKE ONE TABLET BY MOUTH ONCE DAILY  . Melatonin 3 MG TABS Take 6 mg by mouth. Take 2 tablets to = 6 mg  . metFORMIN (GLUCOPHAGE) 500 MG tablet Take by mouth 2 (two) times daily with a meal.  . NON FORMULARY Diet Type:  NCS, Honey Thick Liquids, Mechanical soft Chopped Meat  . polyethylene glycol (MIRALAX / GLYCOLAX) packet Take 17 g by mouth daily as needed for mild constipation.  . Potassium Chloride ER 20 MEQ TBCR Take 20 mEq by mouth daily.   . Prasterone, DHEA, 25 MG TABS Take 50 mg by mouth daily. Take 2 tablets to = 50 mg  . saccharomyces boulardii (FLORASTOR) 250 MG capsule Take 250 mg by mouth 2 (two) times daily.  Marland Kitchen senna-docusate (SENOKOT-S) 8.6-50 MG tablet Take 2 tablets by mouth daily as needed.   . Skin Protectants, Misc. (ENDIT EX) Apply every shift and as needed  . traZODone (DESYREL) 50 MG tablet Take 50 mg by mouth at bedtime.  . [DISCONTINUED]  Insulin Glargine (LANTUS SOLOSTAR) 100 UNIT/ML Solostar Pen Inject 16 Units into the skin at  bedtime.  . [DISCONTINUED] nystatin (MYCOSTATIN) 100000 UNIT/ML suspension Take 5 mLs by mouth 4 (four) times daily.   No facility-administered encounter medications on file as of 04/23/2018.      SIGNIFICANT DIAGNOSTIC EXAMS  PREVIOUS:   04-19-18: chest x-ray: 1. Cardiomegaly with elevated pulmonary vasculature and patchy interstitial changes suggest congestive heart failure with probable interstitial pulmonary edema. Pneumonia could produce a similar picture  NO NEW EXAMS.   LABS REVIEWED PREVIOUS :   01-17-18: hgb a1c  8.4 tsh 3.54 04-18-18: glucose 38; bun 11  creat 0.53 k+ 2.5; na++ 142 ca 8.5 04-20-18: glucose 31; bun 10; creat 0.51; k+ 3.9; na++ 144; ca 8.2   TODAY:   04-23-18: glucose 158; bun 15; creat 0.74; k+ 3.2; na++ 139; ca 8.3   Review of Systems  Constitutional: Negative for malaise/fatigue.  Respiratory: Negative for cough and shortness of breath.   Cardiovascular: Negative for chest pain, palpitations and leg swelling.  Gastrointestinal: Negative for abdominal pain, constipation and heartburn.  Musculoskeletal: Positive for myalgias. Negative for back pain and joint pain.       Has bilateral hand burning with left worse than right Feet and hands are cold.   Skin: Negative.   Neurological: Positive for sensory change. Negative for dizziness.  Psychiatric/Behavioral: The patient is not nervous/anxious.       Physical Exam  Constitutional: She is oriented to person, place, and time. She appears well-developed and well-nourished. No distress.  Neck: No thyromegaly present.  Cardiovascular: Normal rate, regular rhythm, normal heart sounds and intact distal pulses.  Pulmonary/Chest: Effort normal and breath sounds normal. No respiratory distress.  Abdominal: Soft. Bowel sounds are normal. She exhibits no distension. There is no tenderness.  Musculoskeletal: Normal range of motion. She exhibits no edema.  Hands and feet are cold.   Lymphadenopathy:    She has  no cervical adenopathy.  Neurological: She is alert and oriented to person, place, and time.  Skin: Skin is warm and dry. She is not diaphoretic.  Psychiatric: She has a normal mood and affect.     ASSESSMENT/ PLAN:  TODAY:   1. Neuropathic pain 2. Hypokalemia  Will begin her on neurontin 100 mg nightly  Will give her k+ 40 meq now   MD is aware of resident's narcotic use and is in agreement with current plan of care. We will attempt to wean resident as apropriate   Ok Edwards NP [facility]  Contact [phone] Monday through Friday 8am- 5pm  After hours call [phone]

## 2018-04-26 DIAGNOSIS — I5022 Chronic systolic (congestive) heart failure: Secondary | ICD-10-CM | POA: Insufficient documentation

## 2018-04-26 DIAGNOSIS — K5909 Other constipation: Secondary | ICD-10-CM | POA: Insufficient documentation

## 2018-04-26 DIAGNOSIS — R1312 Dysphagia, oropharyngeal phase: Secondary | ICD-10-CM | POA: Insufficient documentation

## 2018-04-26 DIAGNOSIS — I5032 Chronic diastolic (congestive) heart failure: Secondary | ICD-10-CM | POA: Insufficient documentation

## 2018-04-29 DIAGNOSIS — J189 Pneumonia, unspecified organism: Secondary | ICD-10-CM | POA: Insufficient documentation

## 2018-04-30 ENCOUNTER — Encounter (INDEPENDENT_AMBULATORY_CARE_PROVIDER_SITE_OTHER): Payer: Medicare Other | Admitting: Vascular Surgery

## 2018-05-01 ENCOUNTER — Other Ambulatory Visit
Admission: RE | Admit: 2018-05-01 | Discharge: 2018-05-01 | Disposition: A | Discharge status: Home / Self Care | Payer: No Typology Code available for payment source | Source: Ambulatory Visit | Attending: Adult Health | Admitting: Adult Health

## 2018-05-01 ENCOUNTER — Encounter (INDEPENDENT_AMBULATORY_CARE_PROVIDER_SITE_OTHER): Payer: Medicare Other | Admitting: Vascular Surgery

## 2018-05-01 ENCOUNTER — Telehealth: Payer: Self-pay | Admitting: Internal Medicine

## 2018-05-01 DIAGNOSIS — E876 Hypokalemia: Secondary | ICD-10-CM | POA: Insufficient documentation

## 2018-05-01 DIAGNOSIS — Q221 Congenital pulmonary valve stenosis: Secondary | ICD-10-CM | POA: Diagnosis not present

## 2018-05-01 DIAGNOSIS — I371 Nonrheumatic pulmonary valve insufficiency: Secondary | ICD-10-CM | POA: Diagnosis not present

## 2018-05-01 DIAGNOSIS — I5022 Chronic systolic (congestive) heart failure: Secondary | ICD-10-CM | POA: Diagnosis not present

## 2018-05-01 LAB — BASIC METABOLIC PANEL
Anion gap: 10 (ref 5–15)
BUN: 9 mg/dL (ref 8–23)
CHLORIDE: 93 mmol/L — AB (ref 98–111)
CO2: 34 mmol/L — AB (ref 22–32)
Calcium: 7.8 mg/dL — ABNORMAL LOW (ref 8.9–10.3)
Creatinine, Ser: 0.91 mg/dL (ref 0.44–1.00)
GFR calc Af Amer: >60 mL/min (ref >60)
GFR calc non Af Amer: >60 mL/min (ref >60)
Glucose, Bld: 161 mg/dL — ABNORMAL HIGH (ref 70–99)
POTASSIUM: 3.6 mmol/L (ref 3.5–5.1)
Sodium: 137 mmol/L (ref 135–145)

## 2018-05-01 NOTE — Telephone Encounter (Signed)
Spoke pt and she has been scheduled for 4:30pm tomorrow.

## 2018-05-01 NOTE — Telephone Encounter (Signed)
Copied from [place] [id number]. Topic: Quick Communication - See Telephone Encounter >> May 01, 2018  2:28 PM Sheran Luz wrote: CRM for notification. See Telephone encounter for: 05/01/18.  Pt called requesting to speak with Dr. Demetrios Isaacs nurse, Pt did not want to disclose what it was regarding. Pt then states she has been in hospital and is needing to see Dr. Derrel Nip for depression-offered to make pt appointment but pt thinks she should be seen earlier than 10/29-the first available. Please advise.

## 2018-05-01 NOTE — Telephone Encounter (Signed)
Yes,  If you use the 4:30 make it a 30 minute appt

## 2018-05-01 NOTE — Telephone Encounter (Signed)
Spoke with patient she states she was hospitalized on 03/21/18 due to heart stopped and she had pneumonia  . She is currently in rehab at [facility] .   Saw cardiologist today and she suggested that she see PCP for depression . Patient states she feels down . No thoughts of harming herself  Offered appointment for Thursday and she is unable to come in due to another appointment .   Please call call first [phone] next [phone]

## 2018-05-01 NOTE — Telephone Encounter (Signed)
If pt can come in tomorrow would it be okay to use the 4:30 or 4:45 available appointment.

## 2018-05-01 NOTE — Telephone Encounter (Signed)
  Left voicemail to call office can place on schedule 05/03/18 @ 11:00 a/r 10:45am.

## 2018-05-02 ENCOUNTER — Encounter (INDEPENDENT_AMBULATORY_CARE_PROVIDER_SITE_OTHER): Payer: Medicare Other | Admitting: Nurse Practitioner

## 2018-05-02 ENCOUNTER — Encounter: Payer: Self-pay | Admitting: Internal Medicine

## 2018-05-02 ENCOUNTER — Ambulatory Visit (INDEPENDENT_AMBULATORY_CARE_PROVIDER_SITE_OTHER): Payer: Medicare Other | Admitting: Internal Medicine

## 2018-05-02 DIAGNOSIS — J189 Pneumonia, unspecified organism: Secondary | ICD-10-CM

## 2018-05-02 DIAGNOSIS — Z8674 Personal history of sudden cardiac arrest: Secondary | ICD-10-CM | POA: Diagnosis not present

## 2018-05-02 DIAGNOSIS — Z8673 Personal history of transient ischemic attack (TIA), and cerebral infarction without residual deficits: Secondary | ICD-10-CM | POA: Diagnosis not present

## 2018-05-02 DIAGNOSIS — F339 Major depressive disorder, recurrent, unspecified: Secondary | ICD-10-CM

## 2018-05-02 MED ORDER — MIRTAZAPINE 15 MG PO TABS: ORAL_TABLET | ORAL | 2 refills | Status: DC

## 2018-05-02 NOTE — Patient Instructions (Signed)
I am prescribing  An antidepressant to take at bedtime  called mirtazipine.  You can suspend the trazodone but continue   the prozac.  Start with 1/2 tablet daily at bedtime  Increase   To a full tablet AFTER ONE WEEK    Return to see me in 3 weeks

## 2018-05-02 NOTE — Progress Notes (Signed)
Subjective:  Patient ID: Haley Young, female    DOB: 19-Sep-1950  Age: 67 y.o. MRN: [id number]  CC: Diagnoses of Depression, recurrent ([place]), HCAP (healthcare-associated pneumonia), History of cardiac arrest, and Cerebral infarction, watershed distribution, bilateral, remote, resolved were pertinent to this visit.  HPI Haley Young presents for evaluation and treatmnt of depression .  She is accompanied by her  husband   Patient is currently at [facility] recovering from a recent episode of cardiac arrest that occurring during acute infection with pneumonia.  She developed pneumonia in September 11 after staying  in a hotel in [place].  Woke up severely hypoxic and dyspneic.  Treated in ER at [facility] , after 3 hours during the return trip she she lost consciousness.  Her husband drove her to the nearest ER in [place], and treated for PEA   Was  transferred to [facility] on Sept 12 and spent 15 day in the CCU, was mechanically ventilated for 8 days.  .  Had 2 code strokes attributed to watershed infarcts in setting of PEA arrest.   Caused transient aphasia   And LUE  Weakness.  Was discharged on Eliquis    .   Saw cardiology yesterday at [facility]:  [place] had returned to normal and no evidence of damage to the heart by ECHO  She was assessed for depression and screened positive.  Pertinent negative symptoms include  Anorexia, fatigue,  Anhedonia. Weight loss:   Outpatient Medications Prior to Visit  Medication Sig Dispense Refill  . acetaminophen (TYLENOL) 160 MG/5ML elixir Take by mouth every 6 (six) hours as needed for fever. 20.3 mL of 160 mg/49mL    . apixaban (ELIQUIS) 5 MG TABS tablet Take 5 mg by mouth 2 (two) times daily.    Marland Kitchen aspirin 81 MG tablet Take 81 mg by mouth daily.     Marland Kitchen atorvastatin (LIPITOR) 40 MG tablet Take 40 mg by mouth daily.    . fludrocortisone (FLORINEF) 0.1 MG tablet Take 0.05 mg by mouth daily.   0  . FLUoxetine (PROZAC) 10 MG tablet TAKE 1 TABLET BY MOUTH ONCE  DAILY 90 tablet 1  . glucose blood (ONE TOUCH ULTRA TEST) test strip USE ONE STRIP TO CHECK GLUCOSE THREE TIMES DAILY AS DIRECTED E11.65 200 each 2  . hydrocortisone (CORTEF) 10 MG tablet Take 10 mg by mouth daily. Take at 1 PM    . hydrocortisone (CORTEF) 20 MG tablet Take 20 mg by mouth daily. Take at 8 AM    . insulin lispro (HUMALOG) 100 UNIT/ML injection Inject 1-4 Units into the skin 3 (three) times daily before meals. SSI:  0-200 = 0 units, 201-250 = 1 unit, 251-300 = 2 units, 301-350 = 3 units, >350 = 4 units and call MD    . levothyroxine (SYNTHROID, LEVOTHROID) 125 MCG tablet TAKE ONE TABLET BY MOUTH ONCE DAILY 90 tablet 3  . metFORMIN (GLUCOPHAGE) 500 MG tablet Take by mouth 2 (two) times daily with a meal.    . NON FORMULARY Diet Type:  NCS, Honey Thick Liquids, Mechanical soft Chopped Meat    . polyethylene glycol (MIRALAX / GLYCOLAX) packet Take 17 g by mouth daily as needed for mild constipation.    . Potassium Chloride ER 20 MEQ TBCR Take 20 mEq by mouth daily.     . Prasterone, DHEA, 25 MG TABS Take 50 mg by mouth daily. Take 2 tablets to = 50 mg    . saccharomyces boulardii (FLORASTOR)  250 MG capsule Take 250 mg by mouth 2 (two) times daily.    Marland Kitchen senna-docusate (SENOKOT-S) 8.6-50 MG tablet Take 2 tablets by mouth daily as needed.     . Skin Protectants, Misc. (ENDIT EX) Apply every shift and as needed    . traZODone (DESYREL) 50 MG tablet Take 50 mg by mouth at bedtime.    . furosemide (LASIX) 40 MG tablet Take 40 mg by mouth daily.    . Melatonin 3 MG TABS Take 6 mg by mouth. Take 2 tablets to = 6 mg    . ondansetron (ZOFRAN) 4 MG tablet Take by mouth.     No facility-administered medications prior to visit.     Review of Systems;  Patient denies headache, fevers, malaise, unintentional weight loss, skin rash, eye pain, sinus congestion and sinus pain, sore throat, dysphagia,  hemoptysis , cough, dyspnea, wheezing, chest pain, palpitations, orthopnea, edema, abdominal pain,  nausea, melena, diarrhea, constipation, flank pain, dysuria, hematuria, urinary  Frequency, nocturia, numbness, tingling, seizures,  Focal weakness, Loss of consciousness,  Tremor, insomnia, depression, anxiety, and suicidal ideation.      Objective:  BP 94/64 (BP Location: Left Arm, Patient Position: Sitting, Cuff Size: Normal)   Pulse 70   Temp 98.5 F (36.9 C) (Oral)   Resp 15   Ht 5\' 1"  (1.549 m)   Wt 130 lb (59 kg)   SpO2 98%   BMI 24.56 kg/m   BP Readings from Last 3 Encounters:  05/03/18 (!) 97/52  05/02/18 94/64  04/23/18 (!) 99/55    Wt Readings from Last 3 Encounters:  05/03/18 131 lb (59.4 kg)  05/02/18 130 lb (59 kg)  04/23/18 131 lb 14.4 oz (59.8 kg)    General appearance: alert, cooperative and appears stated age Ears: normal TM's and external ear canals both ears Throat: lips, mucosa, and tongue normal; teeth and gums normal Neck: no adenopathy, no carotid bruit, supple, symmetrical, trachea midline and thyroid not enlarged, symmetric, no tenderness/mass/nodules Back: symmetric, no curvature. ROM normal. No CVA tenderness. Lungs: clear to auscultation bilaterally Heart: regular rate and rhythm, S1, S2 normal, no murmur, click, rub or gallop Abdomen: soft, non-tender; bowel sounds normal; no masses,  no organomegaly Pulses: 2+ and symmetric Skin: Skin color, texture, turgor normal. No rashes or lesions Lymph nodes: Cervical, supraclavicular, and axillary nodes normal. Psych: affect flat, subdiued, makes good eye contact. No fidgeting,  Smiles easily.  Denies suicidal thoughts   Lab Results  Component Value Date   HGBA1C 8.4 (H) 01/17/2018   HGBA1C 7.0 (H) 05/11/2017   HGBA1C 9.1 08/21/2015    Lab Results  Component Value Date   CREATININE 0.91 05/01/2018   CREATININE 0.74 04/23/2018   CREATININE 0.51 04/20/2018    Lab Results  Component Value Date   WBC 7.3 04/23/2018   HGB 10.5 (L) 04/23/2018   HCT 34.1 (L) 04/23/2018   PLT 193 04/23/2018    GLUCOSE 161 (H) 05/01/2018   CHOL 239 (H) 04/14/2016   TRIG 162.0 (H) 04/14/2016   HDL 57.90 04/14/2016   LDLDIRECT 165.0 04/14/2016   LDLCALC 149 (H) 04/14/2016   ALT 14 03/06/2018   AST 19 03/06/2018   NA 137 05/01/2018   K 3.6 05/01/2018   CL 93 (L) 05/01/2018   CREATININE 0.91 05/01/2018   BUN 9 05/01/2018   CO2 34 (H) 05/01/2018   TSH 3.54 01/17/2018   HGBA1C 8.4 (H) 01/17/2018   MICROALBUR 1.2 05/17/2017    No results found.  Assessment & Plan:   Problem List Items Addressed This Visit    Cerebral infarction, watershed distribution, bilateral, remote, resolved    Occurring in the setting of PEA/cardiac arrest.  symptoms of LUE weakness and dysphagia.   Symptoms are resolving       Depression, recurrent (HCC)    Aggravated by her recent cardiac arrest which occurred as a complication of pneumonia.  currently taking prozac , adding remeron with dose to be increased gradually from 7.5 mg      Relevant Medications   mirtazapine (REMERON) 15 MG tablet   HCAP (healthcare-associated pneumonia)    Unclear if legionella was ruled out given the  occurrence of infection in a hotel room . Resolved clinically and by repeat films.       History of cardiac arrest    PEA , occurring in the setting of hypoxic respiratory failure secondary to community acquired pneumonia CA complicated by adrenal insuffiency        A total of 40 minutes was spent with patient more than half of which was spent in counseling patient on the above mentioned issues , reviewing and explaining recent labs and imaging studies done, and coordination of care.  I am having Kirkland Hun Owensby start on mirtazapine. I am also having her maintain her levothyroxine, aspirin, glucose blood, fludrocortisone, FLUoxetine, acetaminophen, atorvastatin, apixaban, insulin lispro, hydrocortisone, hydrocortisone, Melatonin, polyethylene glycol, senna-docusate, traZODone, furosemide, metFORMIN, Potassium Chloride ER, Prasterone  (DHEA), NON FORMULARY, (Skin Protectants, Misc. (ENDIT EX)), saccharomyces boulardii, and ondansetron.  Meds ordered this encounter  Medications  . mirtazapine (REMERON) 15 MG tablet    Sig: 1/2  Tablet daily at bedtime.    Dispense:  30 tablet    Refill:  2    There are no discontinued medications.  Follow-up: Return in about 3 weeks (around 05/23/2018).   [name], MD

## 2018-05-03 ENCOUNTER — Encounter (INDEPENDENT_AMBULATORY_CARE_PROVIDER_SITE_OTHER): Payer: Self-pay | Admitting: Nurse Practitioner

## 2018-05-03 ENCOUNTER — Other Ambulatory Visit: Payer: Self-pay

## 2018-05-03 ENCOUNTER — Ambulatory Visit (INDEPENDENT_AMBULATORY_CARE_PROVIDER_SITE_OTHER): Payer: Medicare Other | Admitting: Nurse Practitioner

## 2018-05-03 VITALS — BP 97/52 | HR 69 | Resp 16 | Ht 61.0 in | Wt 131.0 lb

## 2018-05-03 DIAGNOSIS — R209 Unspecified disturbances of skin sensation: Secondary | ICD-10-CM

## 2018-05-03 DIAGNOSIS — M792 Neuralgia and neuritis, unspecified: Secondary | ICD-10-CM | POA: Diagnosis not present

## 2018-05-03 DIAGNOSIS — Z794 Long term (current) use of insulin: Secondary | ICD-10-CM | POA: Diagnosis not present

## 2018-05-03 DIAGNOSIS — E1165 Type 2 diabetes mellitus with hyperglycemia: Secondary | ICD-10-CM | POA: Diagnosis not present

## 2018-05-03 DIAGNOSIS — E876 Hypokalemia: Secondary | ICD-10-CM | POA: Insufficient documentation

## 2018-05-03 DIAGNOSIS — Z8674 Personal history of sudden cardiac arrest: Secondary | ICD-10-CM | POA: Insufficient documentation

## 2018-05-03 DIAGNOSIS — Z8673 Personal history of transient ischemic attack (TIA), and cerebral infarction without residual deficits: Secondary | ICD-10-CM | POA: Insufficient documentation

## 2018-05-03 NOTE — Assessment & Plan Note (Addendum)
Occurring in the setting of PEA/cardiac arrest.  symptoms of LUE weakness and dysphagia.   Symptoms are resolving

## 2018-05-03 NOTE — Assessment & Plan Note (Signed)
Aggravated by her recent cardiac arrest which occurred as a complication of pneumonia.  currently taking prozac , adding remeron with dose to be increased gradually from 7.5 mg

## 2018-05-03 NOTE — Assessment & Plan Note (Addendum)
Unclear if legionella was ruled out given the  occurrence of infection in a hotel room . Resolved clinically and by repeat films.

## 2018-05-03 NOTE — Assessment & Plan Note (Signed)
PEA , occurring in the setting of hypoxic respiratory failure secondary to community acquired pneumonia CA complicated by adrenal insuffiency

## 2018-05-04 ENCOUNTER — Encounter (INDEPENDENT_AMBULATORY_CARE_PROVIDER_SITE_OTHER): Payer: Self-pay | Admitting: Nurse Practitioner

## 2018-05-04 NOTE — Progress Notes (Signed)
Subjective:    Patient ID: Haley Young, female    DOB: 1951-02-18, 67 y.o.   MRN: [id number] Chief Complaint  Patient presents with  . New Patient (Initial Visit)    bilateral leg pain, left more     HPI  Haley Young is a 67 y.o. female that presents with bilateral arm and leg coldness.  She denies pain but states that her legs and upper arms become extremely cold throughout the evening to the point where she cannot stand it.  She states that this happened following a long hospitalization after a bout with pneumonia.  She currently resides in a skilled nursing facility however she will be transitioning to home shortly.  She was hospitalized on 03/21/2018 18 for pneumonia.  She subsequently had to be airlifted to [facility] where she states that she was on "life support" for 8 days, and spent 21 days in the hospital.  She stated that when she woke her hands and feet were cold.  She states that she has some left-sided weakness.  She does not smoke or drink alcohol.  Her left upper extremity is a gray discoloration however the patient states that this has been present for some time due to her Addison's disease.  She also has a left-sided pacemaker and a history of pulmonary stenosis.  She underwent a left lower extremity arterial duplex from an outside facility which displayed biphasic waveforms throughout her left lower extremity.  She also had a left upper extremity duplex ultrasound performed which displayed biphasic waveforms at the subclavian and brachial artery however the axillary artery radial artery and ulnar artery were all monophasic.  Past Medical History:  Diagnosis Date  . Asthma   . Chicken pox   . Depression   . Diabetes mellitus   . Frequent UTI   . GERD (gastroesophageal reflux disease)   . Heart disease   . Heart murmur   . 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 Hx of colonic polyps   . Hyperhidrosis    especially with hot flashes    .  Hyperlipidemia   . Lipoma    R axilla   . MRSA cellulitis    surgical wound infection  . Osteoporosis   . Pyelocystitis    as a child   . Thyroid disease   . Urinary incontinence     Past Surgical History:  Procedure Laterality Date  . ABDOMINAL HYSTERECTOMY  1970  . [place], 2005   pulmonary stenosis with valve replacement, Dr. Evelina Dun  . PARTIAL HYSTERECTOMY  1970  . PULMONARY VEIN STENOSIS REPAIR    . VALVE REPLACEMENT      Social History   Socioeconomic History  . Marital status: Married    Spouse name: Not on file  . Number of children: 2  . Years of education: Not on file  . Highest education level: Not on file  Occupational History  . Occupation: Retired    Fish farm manager: RETIRED  . Occupation: Press photographer   Social Needs  . Financial resource strain: Not on file  . Food insecurity:    Worry: Never true    Inability: Never true  . Transportation needs:    Medical: No    Non-medical: No  Tobacco Use  . Smoking status: Never Smoker  . Smokeless tobacco: Never Used  Substance and Sexual Activity  . Alcohol use: Yes    Comment: occasional wine   . Drug  use: No  . Sexual activity: Yes  Lifestyle  . Physical activity:    Days per week: 0 days    Minutes per session: 0 min  . Stress: Only a little  Relationships  . Social connections:    Talks on phone: Patient refused    Gets together: Patient refused    Attends religious service: Patient refused    Active member of club or organization: Patient refused    Attends meetings of clubs or organizations: Patient refused    Relationship status: Patient refused  . Intimate partner violence:    Fear of current or ex partner: No    Emotionally abused: No    Physically abused: No    Forced sexual activity: No  Other Topics Concern  . Not on file  Social History Narrative   Divorced x 2      Sister is pt here- Surveyor, minerals      Cares for 16 year old mother      46 for daughter with Down's  syndrome      Adopted son with Asberger's syndrome      G1P1      Cares for 2 disabled children     Family History  Problem Relation Age of Onset  . Coarctation of the aorta Sister   . Diabetes Mother   . COPD Mother   . Obesity Mother   . Depression Mother   . Pancreatic cancer Mother   . Alcohol abuse Father   . Cirrhosis Father   . Down syndrome Brother   . Pneumonia Brother   . Down syndrome Daughter   . Lupus Daughter   . Ulcerative colitis Daughter     Allergies  Allergen Reactions  . Oxycodone Hcl     REACTION: hallucinations  . Albuterol Anxiety    Other reaction(s): Other (See Comments) Tremors   . Oxycodone Hcl Other (See Comments)    halucinations     Review of Systems   Review of Systems: Negative Unless Checked Constitutional: [x] Weight loss  [] Fever  [] Chills Cardiac: [] Chest pain   []  Atrial Fibrillation  [] Palpitations   [] Shortness of breath when laying flat   [] Shortness of breath with exertion. Vascular:  [] Pain in legs with walking   [] Pain in legs with standing  [] History of DVT   [] Phlebitis   [] Swelling in legs   [] Varicose veins   [] Non-healing ulcers Pulmonary:   [] Uses home oxygen   [] Productive cough   [] Hemoptysis   [] Wheeze  [] COPD   [] Asthma Neurologic:  [] Dizziness   [] Seizures   [] History of stroke   [] History of TIA  [] Aphasia   [] Vissual changes   [x] Weakness or numbness in arm   [x] Weakness or numbness in leg Musculoskeletal:   [] Joint swelling   [] Joint pain   [] Low back pain  []  History of Knee Replacement Hematologic:  [] Easy bruising  [] Easy bleeding   [] Hypercoagulable state   [x] Anemic Gastrointestinal:  [] Diarrhea   [] Vomiting  [] Gastroesophageal reflux/heartburn   [] Difficulty swallowing. Genitourinary:  [x] Chronic kidney disease   [] Difficult urination  [] Anuric   [] Blood in urine Skin:  [] Rashes   [] Ulcers  Psychological:  [x] History of anxiety   [x]  History of major depression  []  Memory Difficulties     Objective:    Physical Exam  BP (!) 97/52   Pulse 69   Resp 16   Ht 5\' 1"  (1.549 m)   Wt 131 lb (59.4 kg)   BMI 24.75 kg/m   Gen:  WD/WN, NAD, frail-appearing Head: [place]/AT, No temporalis wasting.  Ear/Nose/Throat: Hearing grossly intact, nares w/o erythema or drainage Eyes: PER, EOMI, sclera nonicteric.  Neck: Supple, no masses.  No JVD.  Pulmonary:  Good air movement, no use of accessory muscles.  Cardiac: RRR Vascular:  1+ soft edema bilateral lower extremities, warm skin bilaterally, scattered varicosities. Vessel Right Left  Radial Palpable Palpable  Dorsalis Pedis Palpable Palpable  Posterior Tibial Palpable Palpable   Gastrointestinal: soft, non-distended. No guarding/no peritoneal signs.  Musculoskeletal: Uses a walker for ambulation.  Left grip slightly greater than right .  No deformity or atrophy.  Neurologic: Pain and light touch intact in extremities.  Symmetrical.  Speech is fluent. Motor exam as listed above. Psychiatric: Judgment intact, Mood & affect appropriate for pt's clinical situation. Dermatologic: No Venous rashes. No Ulcers Noted.  No changes consistent with cellulitis. Lymph : No Cervical lymphadenopathy, no lichenification or skin changes of chronic lymphedema.      Assessment & Plan:   1. Neuropathic pain of hand, unspecified laterality Based on the preliminary duplex done by outside sources, the patient appears to have some areas of atherosclerotic plaque in her upper extremities causing dampened waveforms.  We will obtain finger waveforms with ABIs, in order to determine if there should just proximal stenosis or if there is some evidence of microvascular disease as well.  I have advised the patient to wear wool socks and gloves in order to determine if that will help with the pain symptoms.  Depending on what the digit PPG show we may look at additional testing for ray nods although this is an atypical presentation.  2. Sensation of cold in leg We will obtain  bilateral ABIs in order to establish a baseline.  This will also allow [place] to check toe waveforms.  This also allow [place] to determine if this is more of a component of microvascular disease or if this also could have neurogenic component as well.  The patient will follow up in the office following her studies. - VAS [place] ABI WITH/WO TBI; Future  3. Type 2 diabetes mellitus with hyperglycemia, with long-term current use of insulin (HCC) Continue hypoglycemic medications as already ordered, these medications have been reviewed and there are no changes at this time.  Hgb A1C to be monitored as already arranged by primary service    Current Outpatient Medications on File Prior to Visit  Medication Sig Dispense Refill  . acetaminophen (TYLENOL) 160 MG/5ML elixir Take by mouth every 6 (six) hours as needed for fever. 20.3 mL of 160 mg/44mL    . apixaban (ELIQUIS) 5 MG TABS tablet Take 5 mg by mouth 2 (two) times daily.    Marland Kitchen aspirin 81 MG tablet Take 81 mg by mouth daily.     Marland Kitchen atorvastatin (LIPITOR) 40 MG tablet Take 40 mg by mouth daily.    . fludrocortisone (FLORINEF) 0.1 MG tablet Take 0.05 mg by mouth daily.   0  . FLUoxetine (PROZAC) 10 MG tablet TAKE 1 TABLET BY MOUTH ONCE DAILY 90 tablet 1  . furosemide (LASIX) 40 MG tablet Take 40 mg by mouth daily.    Marland Kitchen glucose blood (ONE TOUCH ULTRA TEST) test strip USE ONE STRIP TO CHECK GLUCOSE THREE TIMES DAILY AS DIRECTED E11.65 200 each 2  . hydrocortisone (CORTEF) 10 MG tablet Take 10 mg by mouth daily. Take at 1 PM    . hydrocortisone (CORTEF) 20 MG tablet Take 20 mg by mouth daily. Take at 8 AM    .  insulin lispro (HUMALOG) 100 UNIT/ML injection Inject 1-4 Units into the skin 3 (three) times daily before meals. SSI:  0-200 = 0 units, 201-250 = 1 unit, 251-300 = 2 units, 301-350 = 3 units, >350 = 4 units and call MD    . levothyroxine (SYNTHROID, LEVOTHROID) 125 MCG tablet TAKE ONE TABLET BY MOUTH ONCE DAILY 90 tablet 3  . Melatonin 3 MG TABS Take 6 mg  by mouth. Take 2 tablets to = 6 mg    . metFORMIN (GLUCOPHAGE) 500 MG tablet Take by mouth 2 (two) times daily with a meal.    . mirtazapine (REMERON) 15 MG tablet 1/2  Tablet daily at bedtime. 30 tablet 2  . NON FORMULARY Diet Type:  NCS, Honey Thick Liquids, Mechanical soft Chopped Meat    . ondansetron (ZOFRAN) 4 MG tablet Take by mouth.    . polyethylene glycol (MIRALAX / GLYCOLAX) packet Take 17 g by mouth daily as needed for mild constipation.    . Potassium Chloride ER 20 MEQ TBCR Take 20 mEq by mouth daily.     . Prasterone, DHEA, 25 MG TABS Take 50 mg by mouth daily. Take 2 tablets to = 50 mg    . saccharomyces boulardii (FLORASTOR) 250 MG capsule Take 250 mg by mouth 2 (two) times daily.    Marland Kitchen senna-docusate (SENOKOT-S) 8.6-50 MG tablet Take 2 tablets by mouth daily as needed.     . Skin Protectants, Misc. (ENDIT EX) Apply every shift and as needed    . traZODone (DESYREL) 50 MG tablet Take 50 mg by mouth at bedtime.     No current facility-administered medications on file prior to visit.     There are no Patient Instructions on file for this visit. No follow-ups on file.   Kris Hartmann, NP  This note was completed with Sales executive.  Any errors are purely unintentional.

## 2018-05-07 ENCOUNTER — Non-Acute Institutional Stay (SKILLED_NURSING_FACILITY): Payer: Medicare Other | Admitting: Adult Health

## 2018-05-07 ENCOUNTER — Encounter: Payer: Self-pay | Admitting: Adult Health

## 2018-05-07 DIAGNOSIS — E039 Hypothyroidism, unspecified: Secondary | ICD-10-CM

## 2018-05-07 DIAGNOSIS — E876 Hypokalemia: Secondary | ICD-10-CM

## 2018-05-07 DIAGNOSIS — Z8674 Personal history of sudden cardiac arrest: Secondary | ICD-10-CM | POA: Diagnosis not present

## 2018-05-07 DIAGNOSIS — F339 Major depressive disorder, recurrent, unspecified: Secondary | ICD-10-CM

## 2018-05-07 DIAGNOSIS — I480 Paroxysmal atrial fibrillation: Secondary | ICD-10-CM

## 2018-05-07 DIAGNOSIS — I5022 Chronic systolic (congestive) heart failure: Secondary | ICD-10-CM

## 2018-05-07 DIAGNOSIS — E1169 Type 2 diabetes mellitus with other specified complication: Secondary | ICD-10-CM | POA: Diagnosis not present

## 2018-05-07 DIAGNOSIS — M792 Neuralgia and neuritis, unspecified: Secondary | ICD-10-CM | POA: Diagnosis not present

## 2018-05-07 DIAGNOSIS — E271 Primary adrenocortical insufficiency: Secondary | ICD-10-CM | POA: Diagnosis not present

## 2018-05-07 NOTE — Progress Notes (Signed)
Location:  [facility] Room Number: 203-A Place of Service:  SNF ([phone]) Provider:  Durenda Age, NP  Patient Care Team: Haley Mc, MD as PCP - General (Internal Medicine)  Extended Emergency Contact Information Primary Emergency Contact: [facility] Address: [address]          [place], [place] [address] Johnnette Litter of [place] Phone: [phone] Mobile Phone: [phone] Relation: Spouse  Code Status:  FULL CODE  Goals of care: Advanced Directive information Advanced Directives 04/23/2018  Does Patient Have a Medical Advance Directive? No  Type of Advance Directive -  Does patient want to make changes to medical advance directive? -  Would patient like information on creating a medical advance directive? No - Patient declined     Chief Complaint  Patient presents with  . Acute Visit    Patient seen for neuropathy.    HPI:  Pt is a 67 y.o. female seen for complaints of pain on her LUE and LLE.Marland KitchenNoted that her LUE and LLE are cold to touch and has 2+ edema.  She has been admitted to [facility] on 04/12/18 from a recent hospitalization due to cardiac arrest with pulseless electrical activity. She was out for 7 minutes and was resuscitated. She was recently diagnosed with atrial fibrillation and was started on Eliquis. She has been admitted at [facility] for a short-term rehabilitation.   Past Medical History:  Diagnosis Date  . Asthma   . Chicken pox   . Depression   . Diabetes mellitus   . Frequent UTI   . GERD (gastroesophageal reflux disease)   . Heart disease   . Heart murmur   . 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 Hx of colonic polyps   . Hyperhidrosis    especially with hot flashes    . Hyperlipidemia   . Lipoma    R axilla   . MRSA cellulitis    surgical wound infection  . Osteoporosis   . Pyelocystitis    as a child   . Thyroid disease   .  Urinary incontinence    Past Surgical History:  Procedure Laterality Date  . ABDOMINAL HYSTERECTOMY  1970  . [place], 2005   pulmonary stenosis with valve replacement, Dr. Evelina Dun  . PARTIAL HYSTERECTOMY  1970  . PULMONARY VEIN STENOSIS REPAIR    . VALVE REPLACEMENT      Allergies  Allergen Reactions  . Oxycodone Hcl     REACTION: hallucinations  . Albuterol Anxiety    Other reaction(s): Other (See Comments) Tremors   . Oxycodone Hcl Other (See Comments)    halucinations    Outpatient Encounter Medications as of 04/16/2018  Medication Sig  . acetaminophen (TYLENOL) 160 MG/5ML elixir Take by mouth every 6 (six) hours as needed for fever. 20.3 mL of 160 mg/30mL  . apixaban (ELIQUIS) 5 MG TABS tablet Take 5 mg by mouth 2 (two) times daily.  Marland Kitchen aspirin 81 MG tablet Take 81 mg by mouth daily.   Marland Kitchen atorvastatin (LIPITOR) 40 MG tablet Take 40 mg by mouth daily.  . fludrocortisone (FLORINEF) 0.1 MG tablet Take 0.05 mg by mouth daily.   Marland Kitchen FLUoxetine (PROZAC) 10 MG tablet TAKE 1 TABLET BY MOUTH ONCE DAILY  . furosemide (LASIX) 40 MG tablet Take 40 mg by mouth daily.  . hydrocortisone (CORTEF) 10 MG tablet Take 10 mg by mouth daily. Take at 1  PM  . hydrocortisone (CORTEF) 20 MG tablet Take 20 mg by mouth daily. Take at 8 AM  . levothyroxine (SYNTHROID, LEVOTHROID) 125 MCG tablet TAKE ONE TABLET BY MOUTH ONCE DAILY  . Melatonin 3 MG TABS Take 6 mg by mouth. Take 2 tablets to = 6 mg  . metFORMIN (GLUCOPHAGE) 500 MG tablet Take 1,000 mg by mouth 2 (two) times daily with a meal.   . Potassium Chloride ER 20 MEQ TBCR Take 20 mEq by mouth daily.   . Prasterone, DHEA, 25 MG TABS Take 50 mg by mouth daily. Take 2 tablets to = 50 mg  . senna-docusate (SENOKOT-S) 8.6-50 MG tablet Take 2 tablets by mouth daily as needed.   . [DISCONTINUED] atorvastatin (LIPITOR) 20 MG tablet TAKE 1 TABLET BY MOUTH ONCE DAILY  . [DISCONTINUED] guaifenesin (ROBITUSSIN) 100 MG/5ML syrup Take 200 mg by mouth  every 4 (four) hours as needed for cough.  . [DISCONTINUED] hydrocortisone (CORTEF) 10 MG tablet Take 10 mg by mouth daily.  . [DISCONTINUED] insulin glargine (LANTUS) 100 UNIT/ML injection Inject 16 Units into the skin at bedtime.  . [DISCONTINUED] insulin lispro (HUMALOG) 100 UNIT/ML injection Inject 1-4 Units into the skin 3 (three) times daily before meals. SSI:  0-200 = 0 units, 201-250 = 1 unit, 251-300 = 2 units, 301-350 = 3 units, >350 = 4 units and call MD  . [DISCONTINUED] nystatin (MYCOSTATIN) 100000 UNIT/ML suspension Take 5 mLs by mouth 4 (four) times daily.  . [DISCONTINUED] polyethylene glycol (MIRALAX / GLYCOLAX) packet Take 17 g by mouth daily as needed for mild constipation.  . [DISCONTINUED] traZODone (DESYREL) 150 MG tablet TAKE 1 TABLET BY MOUTH AT BEDTIME  . [DISCONTINUED] traZODone (DESYREL) 50 MG tablet Take 50 mg by mouth at bedtime.  Marland Kitchen glucose blood (ONE TOUCH ULTRA TEST) test strip USE ONE STRIP TO CHECK GLUCOSE THREE TIMES DAILY AS DIRECTED E11.65 (Patient not taking: Reported on 05/07/2018)  . [DISCONTINUED] glipiZIDE (GLIPIZIDE XL) 5 MG 24 hr tablet TAKE 2 TABLETS BY MOUTH IN THE MORNING AND 1 IN THE EVENING  . [DISCONTINUED] hydrocortisone (CORTEF) 5 MG tablet Take 5 mg by mouth daily.  . [DISCONTINUED] loperamide (IMODIUM A-D) 2 MG tablet Take 1 tablet (2 mg total) by mouth 4 (four) times daily as needed for diarrhea or loose stools.  . [DISCONTINUED] LORazepam (ATIVAN) 1 MG tablet Take 0.5 tablets (0.5 mg total) by mouth 2 (two) times daily as needed. for anxiety  . [DISCONTINUED] metFORMIN (GLUCOPHAGE) 1000 MG tablet Take 1 tablet (1,000 mg total) by mouth 2 (two) times daily with a meal.  . [DISCONTINUED] SOLU-CORTEF 100 MG SOLR injection INJECT 2MLS INTO THE MUSCLE AS DIRECTED   No facility-administered encounter medications on file as of 04/16/2018.     Review of Systems  GENERAL: No change in appetite, no fatigue, no weight changes, no fever, chills or  weakness MOUTH and THROAT: Denies oral discomfort, gingival pain or bleeding RESPIRATORY: no cough, SOB, DOE, wheezing, hemoptysis CARDIAC: No chest pain, or palpitations GI: No abdominal pain, diarrhea, constipation, heart burn, nausea or vomiting PSYCHIATRIC: Denies feelings of depression or anxiety. No report of hallucinations, insomnia, paranoia, or agitation   Immunization History  Administered Date(s) Administered  . H1N1 08/13/2008  . Influenza Split 07/28/2011, 06/28/2012  . Influenza, High Dose Seasonal PF 04/14/2016, 07/10/2017, 04/11/2018  . Influenza,inj,Quad PF,6+ Mos 07/25/2013, 08/21/2015  . Influenza-Unspecified 07/22/2015  . Pneumococcal Conjugate-13 09/13/2013  . Pneumococcal Polysaccharide-23 07/30/2011  . Zoster 09/13/2013   Pertinent  Health Maintenance Due  Topic Date Due  . FOOT EXAM  04/22/2019 (Originally 09/14/2014)  . MAMMOGRAM  04/22/2019 (Originally 10/22/2016)  . OPHTHALMOLOGY EXAM  04/22/2019 (Originally 02/28/2018)  . PNA vac Low Risk Adult (2 of 2 - PPSV23) 04/22/2019 (Originally 07/29/2016)  . URINE MICROALBUMIN  05/17/2018  . HEMOGLOBIN A1C  07/20/2018  . COLONOSCOPY  09/14/2022  . INFLUENZA VACCINE  Completed  . DEXA SCAN  Completed   Fall Risk  07/10/2017 07/08/2016 03/07/2016 08/08/2014  Falls in the past year? No No No Yes  Comment - - Emmi Telephone Survey: data to providers prior to load -  Number falls in past yr: - - - 2 or more  Risk for fall due to : - - - Impaired balance/gait   Functional Status Survey:    Vitals:   04/17/18 1433  BP: (!) 103/56  Pulse: 70  Resp: 18  Temp: 97.7 F (36.5 C)  TempSrc: Oral  SpO2: 96%  Weight: 140 lb 14.4 oz (63.9 kg)  Height: 5\' 1"  (1.549 m)   Body mass index is 26.62 kg/m.  Physical Exam  GENERAL APPEARANCE: Well nourished. In no acute distress. Normal body habitus SKIN:  LUE and LLE cold to touch MOUTH and THROAT: Lips are without lesions. Oral mucosa is moist and without lesions.  Tongue is normal in shape, size, and color and without lesions RESPIRATORY: Breathing is even & unlabored, BS CTAB CARDIAC: RRR, + murmur,no extra heart sounds, LUE and LLE 2+ edema, left chest pacemaker GI: Abdomen soft, normal BS, no masses, no tenderness EXTREMITIES:  Able to move X 4 extremities NEUROLOGICAL: There is no tremor. Speech is clear PSYCHIATRIC: Alert and oriented X 3. Affect and behavior are appropriate    Labs reviewed: Recent Labs    04/20/18 0600    NA 144    K 3.9    CL 103    CO2 30    GLUCOSE 31*    BUN 10    CREATININE 0.51    CALCIUM 8.2*      Recent Labs    08/09/17 1123 01/17/18 1145 03/06/18 0940  AST 38 26 19  ALT 17 27 14   ALKPHOS 73 79 56  BILITOT 0.7 0.4 0.4  PROT 7.2 6.6 6.7  ALBUMIN 4.0 3.7 3.9   Recent Labs    08/17/17 1214 03/06/18 0940   WBC 10.4 8.2   NEUTROABS 6.9  --    HGB 14.1 12.4   HCT 42.1 37.6   MCV 90.7 90.2   PLT 255.0 248.0     Lab Results  Component Value Date   TSH 3.54 01/17/2018   Lab Results  Component Value Date   HGBA1C 8.4 (H) 01/17/2018   Lab Results  Component Value Date   CHOL 239 (H) 04/14/2016   HDL 57.90 04/14/2016   LDLCALC 149 (H) 04/14/2016   LDLDIRECT 165.0 04/14/2016   TRIG 162.0 (H) 04/14/2016   CHOLHDL 4 04/14/2016     Assessment/Plan  1. Neuropathy -   Will order Doppler ultrasound venous and arterial, of LUE and LLE    2. Chronic systolic heart failure (HCC)  -continue Lasix 40 mg 1 tab daily   3. Addison's disease ([place])  -  continue hydrocortisone 10 mg 1 tab q. 1 PM and 20 mg 1 tab q. 8 AM    Family/ staff Communication: Discussed plan of care with patient.  Labs/tests ordered: Venous and arterial ultrasound of LUE and LLE   Goals of care:  Short-term care   Haley Age, NP [facility] [phone] (Monday-Friday 8:00 a.m. - 5:00 p.m.) [phone] (after hours)

## 2018-05-07 NOTE — Progress Notes (Addendum)
Location:  [facility] Room Number: 203-A Place of Service:  SNF ([phone]) Provider:  Durenda Age, NP  Patient Care Team: [name], MD as PCP - General (Internal Medicine)  Extended Emergency Contact Information Primary Emergency Contact: [facility] Address: [address]          [place], [place] [address] Johnnette Litter of [place] Phone: [phone] Mobile Phone: [phone] Relation: Spouse  Code Status:  Full Code  Goals of care: Advanced Directive information Advanced Directives 04/23/2018  Does Patient Have a Medical Advance Directive? No  Type of Advance Directive -  Does patient want to make changes to medical advance directive? -  Would patient like information on creating a medical advance directive? No - Patient declined     Chief Complaint  Patient presents with  . Discharge Note    Patient is to discharge home 05/08/18    HPI:  Pt is a 67 y.o. female seen today for a discharge visit.  She is to discharge home on 05/08/18 with home health PT, OT, and Nursing services.  She is on a sliding scale of Humalog insulin and has been getting 1-4 units. She verbalized being on Metformin 1,000 mg BID and Glipizide at home. She has difficulty using the Humalog pen. Humalog sliding scale will be discontinued and will increase Metformin to 1,000 mg BID.  She has been admitted to [facility] on 04/12/2018 from a recent hospitalization due to cardiac arrest with pulseless electrical activity she was out for 7 minutes and was resuscitated.  She was recently diagnosed with atrial fibrillation and was started on Eliquis. She has a PMH of chronic systolic heart failure, diabetes, HLD, and hypothyroidism.    Patient was admitted to this facility for short-term rehabilitation after the patient's recent hospitalization.  Patient has completed SNF rehabilitation and therapy has cleared the patient for discharge.   Past  Medical History:  Diagnosis Date  . Asthma   . Chicken pox   . Depression   . Diabetes mellitus   . Frequent UTI   . GERD (gastroesophageal reflux disease)   . Heart disease   . Heart murmur   . 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 Hx of colonic polyps   . Hyperhidrosis    especially with hot flashes    . Hyperlipidemia   . Lipoma    R axilla   . MRSA cellulitis    surgical wound infection  . Osteoporosis   . Pyelocystitis    as a child   . Thyroid disease   . Urinary incontinence    Past Surgical History:  Procedure Laterality Date  . ABDOMINAL HYSTERECTOMY  1970  . [place], 2005   pulmonary stenosis with valve replacement, Dr. Evelina Dun  . PARTIAL HYSTERECTOMY  1970  . PULMONARY VEIN STENOSIS REPAIR    . VALVE REPLACEMENT      Allergies  Allergen Reactions  . Oxycodone Hcl     REACTION: hallucinations  . Albuterol Anxiety    Other reaction(s): Other (See Comments) Tremors   . Oxycodone Hcl Other (See Comments)    halucinations    Outpatient Encounter Medications as of 05/07/2018  Medication Sig  . acetaminophen (TYLENOL) 160 MG/5ML elixir Take by mouth every 6 (six) hours as needed for fever. 20.3 mL of 160 mg/34mL  . apixaban (ELIQUIS) 5 MG TABS tablet Take 5 mg by mouth 2 (two) times daily.  Marland Kitchen  aspirin 81 MG tablet Take 81 mg by mouth daily.   Marland Kitchen atorvastatin (LIPITOR) 40 MG tablet Take 40 mg by mouth daily.  . fludrocortisone (FLORINEF) 0.1 MG tablet Take 0.05 mg by mouth daily.   Marland Kitchen FLUoxetine (PROZAC) 10 MG tablet TAKE 1 TABLET BY MOUTH ONCE DAILY  . furosemide (LASIX) 40 MG tablet Take 40 mg by mouth daily.  Marland Kitchen gabapentin (NEURONTIN) 100 MG capsule Take 100 mg by mouth at bedtime.  . hydrocortisone (CORTEF) 10 MG tablet Take 10 mg by mouth daily. Take at 1 PM  . hydrocortisone (CORTEF) 20 MG tablet Take 20 mg by mouth daily. Take at 8 AM  . levothyroxine (SYNTHROID, LEVOTHROID) 125 MCG tablet TAKE ONE TABLET BY MOUTH  ONCE DAILY  . Melatonin 3 MG TABS Take 6 mg by mouth. Take 2 tablets to = 6 mg  . metFORMIN (GLUCOPHAGE) 500 MG tablet Take 1,000 mg by mouth 2 (two) times daily with a meal.   . mirtazapine (REMERON) 15 MG tablet 1/2  Tablet daily at bedtime.  . NON FORMULARY Diet Type:  NCS, Honey Thick Liquids, Mechanical soft Chopped Meat  . Potassium Chloride ER 20 MEQ TBCR Take 20 mEq by mouth daily.   . Prasterone, DHEA, 25 MG TABS Take 50 mg by mouth daily. Take 2 tablets to = 50 mg  . senna-docusate (SENOKOT-S) 8.6-50 MG tablet Take 2 tablets by mouth daily as needed.   . Skin Protectants, Misc. (ENDIT EX) Apply every shift and as needed  . [DISCONTINUED] insulin lispro (HUMALOG) 100 UNIT/ML injection Inject 1-4 Units into the skin 3 (three) times daily before meals. SSI:  0-200 = 0 units, 201-250 = 1 unit, 251-300 = 2 units, 301-350 = 3 units, >350 = 4 units and call MD  . glucose blood (ONE TOUCH ULTRA TEST) test strip USE ONE STRIP TO CHECK GLUCOSE THREE TIMES DAILY AS DIRECTED E11.65 (Patient not taking: Reported on 05/07/2018)  . [DISCONTINUED] ondansetron (ZOFRAN) 4 MG tablet Take by mouth.  . [DISCONTINUED] polyethylene glycol (MIRALAX / GLYCOLAX) packet Take 17 g by mouth daily as needed for mild constipation.  . [DISCONTINUED] saccharomyces boulardii (FLORASTOR) 250 MG capsule Take 250 mg by mouth 2 (two) times daily.  . [DISCONTINUED] traZODone (DESYREL) 50 MG tablet Take 50 mg by mouth at bedtime.   No facility-administered encounter medications on file as of 05/07/2018.     Review of Systems  GENERAL: No change in appetite, no fatigue, no weight changes, no fever, chills or weakness MOUTH and THROAT: Denies oral discomfort, gingival pain or bleeding RESPIRATORY: no cough, SOB, DOE, wheezing, hemoptysis CARDIAC: No chest pain, or palpitations GI: No abdominal pain, diarrhea, constipation, heart burn, nausea or vomiting GU: Denies dysuria, frequency, hematuria, incontinence, or  discharge PSYCHIATRIC: Denies feelings of depression or anxiety. No report of hallucinations, insomnia, paranoia, or agitation   Immunization History  Administered Date(s) Administered  . H1N1 08/13/2008  . Influenza Split 07/28/2011, 06/28/2012  . Influenza, High Dose Seasonal PF 04/14/2016, 07/10/2017, 04/11/2018  . Influenza,inj,Quad PF,6+ Mos 07/25/2013, 08/21/2015  . Influenza-Unspecified 07/22/2015  . Pneumococcal Conjugate-13 09/13/2013  . Pneumococcal Polysaccharide-23 07/30/2011  . Zoster 09/13/2013   Pertinent  Health Maintenance Due  Topic Date Due  . FOOT EXAM  04/22/2019 (Originally 09/14/2014)  . MAMMOGRAM  04/22/2019 (Originally 10/22/2016)  . OPHTHALMOLOGY EXAM  04/22/2019 (Originally 02/28/2018)  . PNA vac Low Risk Adult (2 of 2 - PPSV23) 04/22/2019 (Originally 07/29/2016)  . URINE MICROALBUMIN  05/17/2018  . HEMOGLOBIN  A1C  07/20/2018  . COLONOSCOPY  09/14/2022  . INFLUENZA VACCINE  Completed  . DEXA SCAN  Completed   Fall Risk  07/10/2017 07/08/2016 03/07/2016 08/08/2014  Falls in the past year? No No No Yes  Comment - - Emmi Telephone Survey: data to providers prior to load -  Number falls in past yr: - - - 2 or more  Risk for fall due to : - - - Impaired balance/gait      Vitals:   05/07/18 0823 05/07/18 0824  BP: (!) 109/53 (!) 118/53  Pulse: 69   Resp: 18   Temp: 98.1 F (36.7 C)   TempSrc: Oral   SpO2: 99%   Weight: 128 lb 11.2 oz (58.4 kg)   Height: 5\' 1"  (1.549 m)    Body mass index is 24.32 kg/m.  Physical Exam  GENERAL APPEARANCE: Well nourished. In no acute distress. Normal body habitus SKIN:  Skin is warm and dry. LUE with slight darkened skin discoloration MOUTH and THROAT: Lips are without lesions. Oral mucosa is moist and without lesions. Tongue is normal in shape, size, and color and without lesions RESPIRATORY: Breathing is even & unlabored, BS CTAB CARDIAC: RRR, + murmur, no extra heart sounds, no edema, left chest pacemaker GI:  Abdomen soft, normal BS, no masses, no tenderness EXTREMITIES:  Able to move X 4 extremities NEUROLOGICAL: There is no tremor. Speech is clear PSYCHIATRIC: Alert and oriented X 3. Affect and behavior are appropriate   Labs reviewed: Recent Labs    04/20/18 0600 04/23/18 0530 05/01/18 0500  NA 144 139 137  K 3.9 3.2* 3.6  CL 103 94* 93*  CO2 30 33* 34*  GLUCOSE 31* 158* 161*  BUN 10 15 9   CREATININE 0.51 0.74 0.91  CALCIUM 8.2* 8.3* 7.8*   Recent Labs    08/09/17 1123 01/17/18 1145 03/06/18 0940  AST 38 26 19  ALT 17 27 14   ALKPHOS 73 79 56  BILITOT 0.7 0.4 0.4  PROT 7.2 6.6 6.7  ALBUMIN 4.0 3.7 3.9   Recent Labs    08/17/17 1214 03/06/18 0940 04/23/18 0530  WBC 10.4 8.2 7.3  NEUTROABS 6.9  --  4.0  HGB 14.1 12.4 10.5*  HCT 42.1 37.6 34.1*  MCV 90.7 90.2 95.5  PLT 255.0 248.0 193   Lab Results  Component Value Date   TSH 3.54 01/17/2018   Lab Results  Component Value Date   HGBA1C 8.4 (H) 01/17/2018   Lab Results  Component Value Date   CHOL 239 (H) 04/14/2016   HDL 57.90 04/14/2016   LDLCALC 149 (H) 04/14/2016   LDLDIRECT 165.0 04/14/2016   TRIG 162.0 (H) 04/14/2016   CHOLHDL 4 04/14/2016    Assessment/Plan  1. History of cardiac arrest - was resuscitated after 7 minutes of being out   2. Neuropathic pain of left hand - continue gabapentin 100 mg 1 capsule nightly, LLE arterial doppler ultrasound showed abnormal Doppler waveform throughout left lower extremity arterial system, L LE arterial duplex ultrasound showed abnormal Doppler waveform in the left axillary, radial and ulnar arteries compatible with the presence of moderate to severe stenosis, she is being followed up by vascular surgery and there is a follow-up appointment on 05/17/2018   3. Chronic systolic heart failure (HCC) -no SOB, continue Lasix 40 mg 1 tab daily   4. Type 2 diabetes mellitus with other specified complication, unspecified whether long term insulin use (HCC) -will  discontinue Humalog sliding scale and increase metformin from  500 mg twice daily to 1000 mg twice daily Lab Results  Component Value Date   HGBA1C 8.4 (H) 01/17/2018    5. Depression, recurrent ([place])  - mood is stable, continue Remeron 15 mg 1/2 tab = 7.5 mg nightly and fluoxetine 10 mg 1 tab daily   6. Addison's disease ([place]) -continue fludrocortisone 0.1 mg 1/2 tab = 0.05 mg every morning, hydrocortisone 20 mg 1 tab every morning and hydrocortisone 10 mg 1 tab q. 1 PM   7. Acquired hypothyroidism -continue levothyroxine 125 mcg 1 tab daily Lab Results  Component Value Date   TSH 3.54 01/17/2018     8. Hypokalemia -continue KCl ER 10 meq 2 tabs = 20 MEQ daily Lab Results  Component Value Date   K 3.6 05/01/2018    9.  Atrial fibrillation -rate controlled, continue Eliquis 5 mg 1 tab every 12 hours     I have filled out patient's discharge paperwork and written prescriptions.  Patient will receive home health PT and OT.  DME provided:  None  Total discharge time: Greater than 30 minutes Greater than 50 % was spent in counseling and coordination of care.   Discharge time involved coordination of the discharge process with social worker, nursing staff and therapy department. Medical justification for home health services verified.   Durenda Age, NP [facility] [phone] (Monday-Friday 8:00 a.m. - 5:00 p.m.) [phone] (after hours)

## 2018-05-10 ENCOUNTER — Telehealth: Payer: Self-pay | Admitting: Internal Medicine

## 2018-05-10 NOTE — Telephone Encounter (Signed)
Copied from [place] [id number]. Topic: Quick Communication - Home Health Verbal Orders >> May 10, 2018  3:20 PM Rutherford Nail, [place] wrote: Caller/Agency: Harley Alto with [facility] Number: [phone] Requesting OT/PT/Skilled Nursing/Social Work: Nursing Frequency: Calling to inform Dr Derrel Nip that they will be going out to see the patient tomorrow for home health services.

## 2018-05-10 NOTE — Telephone Encounter (Signed)
FYI

## 2018-05-11 ENCOUNTER — Telehealth: Payer: Self-pay | Admitting: Internal Medicine

## 2018-05-11 DIAGNOSIS — K219 Gastro-esophageal reflux disease without esophagitis: Secondary | ICD-10-CM | POA: Diagnosis not present

## 2018-05-11 DIAGNOSIS — Z952 Presence of prosthetic heart valve: Secondary | ICD-10-CM | POA: Diagnosis not present

## 2018-05-11 DIAGNOSIS — I4892 Unspecified atrial flutter: Secondary | ICD-10-CM | POA: Diagnosis not present

## 2018-05-11 DIAGNOSIS — I5022 Chronic systolic (congestive) heart failure: Secondary | ICD-10-CM | POA: Diagnosis not present

## 2018-05-11 DIAGNOSIS — I442 Atrioventricular block, complete: Secondary | ICD-10-CM | POA: Diagnosis not present

## 2018-05-11 DIAGNOSIS — Z7982 Long term (current) use of aspirin: Secondary | ICD-10-CM | POA: Diagnosis not present

## 2018-05-11 DIAGNOSIS — R1312 Dysphagia, oropharyngeal phase: Secondary | ICD-10-CM | POA: Diagnosis not present

## 2018-05-11 DIAGNOSIS — I252 Old myocardial infarction: Secondary | ICD-10-CM | POA: Diagnosis not present

## 2018-05-11 DIAGNOSIS — E039 Hypothyroidism, unspecified: Secondary | ICD-10-CM | POA: Diagnosis not present

## 2018-05-11 DIAGNOSIS — E1142 Type 2 diabetes mellitus with diabetic polyneuropathy: Secondary | ICD-10-CM | POA: Diagnosis not present

## 2018-05-11 DIAGNOSIS — J45909 Unspecified asthma, uncomplicated: Secondary | ICD-10-CM | POA: Diagnosis not present

## 2018-05-11 DIAGNOSIS — F418 Other specified anxiety disorders: Secondary | ICD-10-CM | POA: Diagnosis not present

## 2018-05-11 DIAGNOSIS — Z8701 Personal history of pneumonia (recurrent): Secondary | ICD-10-CM | POA: Diagnosis not present

## 2018-05-11 DIAGNOSIS — Z7901 Long term (current) use of anticoagulants: Secondary | ICD-10-CM | POA: Diagnosis not present

## 2018-05-11 DIAGNOSIS — Z87440 Personal history of urinary (tract) infections: Secondary | ICD-10-CM | POA: Diagnosis not present

## 2018-05-11 DIAGNOSIS — Z9181 History of falling: Secondary | ICD-10-CM | POA: Diagnosis not present

## 2018-05-11 DIAGNOSIS — I110 Hypertensive heart disease with heart failure: Secondary | ICD-10-CM | POA: Diagnosis not present

## 2018-05-11 DIAGNOSIS — I69391 Dysphagia following cerebral infarction: Secondary | ICD-10-CM | POA: Diagnosis not present

## 2018-05-11 DIAGNOSIS — E785 Hyperlipidemia, unspecified: Secondary | ICD-10-CM | POA: Diagnosis not present

## 2018-05-11 DIAGNOSIS — M810 Age-related osteoporosis without current pathological fracture: Secondary | ICD-10-CM | POA: Diagnosis not present

## 2018-05-11 DIAGNOSIS — Z950 Presence of cardiac pacemaker: Secondary | ICD-10-CM | POA: Diagnosis not present

## 2018-05-11 DIAGNOSIS — I480 Paroxysmal atrial fibrillation: Secondary | ICD-10-CM | POA: Diagnosis not present

## 2018-05-11 DIAGNOSIS — E271 Primary adrenocortical insufficiency: Secondary | ICD-10-CM | POA: Diagnosis not present

## 2018-05-11 DIAGNOSIS — Z7984 Long term (current) use of oral hypoglycemic drugs: Secondary | ICD-10-CM | POA: Diagnosis not present

## 2018-05-11 DIAGNOSIS — G4700 Insomnia, unspecified: Secondary | ICD-10-CM | POA: Diagnosis not present

## 2018-05-11 NOTE — Telephone Encounter (Signed)
Copied from [place] [phone]. Topic: Quick Communication - See Telephone Encounter >> May 11, 2018  3:51 PM Vernona Rieger wrote: CRM for notification. See Telephone encounter for: 05/11/18.  Tillie Rung, RN with [facility] called for nursing orders :: One time a week for one week Two times a week for three weeks One time a week for two weeks  Call back @ [phone]

## 2018-05-11 NOTE — Telephone Encounter (Signed)
Verbal orders given  

## 2018-05-14 DIAGNOSIS — I5022 Chronic systolic (congestive) heart failure: Secondary | ICD-10-CM | POA: Diagnosis not present

## 2018-05-14 DIAGNOSIS — J45909 Unspecified asthma, uncomplicated: Secondary | ICD-10-CM | POA: Diagnosis not present

## 2018-05-14 DIAGNOSIS — E1142 Type 2 diabetes mellitus with diabetic polyneuropathy: Secondary | ICD-10-CM | POA: Diagnosis not present

## 2018-05-14 DIAGNOSIS — I110 Hypertensive heart disease with heart failure: Secondary | ICD-10-CM | POA: Diagnosis not present

## 2018-05-14 DIAGNOSIS — I4892 Unspecified atrial flutter: Secondary | ICD-10-CM | POA: Diagnosis not present

## 2018-05-14 DIAGNOSIS — I480 Paroxysmal atrial fibrillation: Secondary | ICD-10-CM | POA: Diagnosis not present

## 2018-05-15 ENCOUNTER — Telehealth: Payer: Self-pay | Admitting: Internal Medicine

## 2018-05-15 DIAGNOSIS — E271 Primary adrenocortical insufficiency: Secondary | ICD-10-CM | POA: Diagnosis not present

## 2018-05-15 DIAGNOSIS — E119 Type 2 diabetes mellitus without complications: Secondary | ICD-10-CM | POA: Diagnosis not present

## 2018-05-15 DIAGNOSIS — E039 Hypothyroidism, unspecified: Secondary | ICD-10-CM | POA: Diagnosis not present

## 2018-05-15 NOTE — Telephone Encounter (Signed)
Copied from [place] [phone]. Topic: Quick Communication - Home Health Verbal Orders >> May 15, 2018  1:11 PM Cecelia Byars, [place] wrote: Caller/Agency: Marlowe Kays / [facility]   Callback Number: [phone]  ok to leave a message  Requesting OT/ Frequency: 2  times a week for 4 weeks

## 2018-05-15 NOTE — Telephone Encounter (Signed)
FYI Verbal orders given as stated below.

## 2018-05-17 ENCOUNTER — Telehealth: Payer: Self-pay | Admitting: Internal Medicine

## 2018-05-17 ENCOUNTER — Encounter (INDEPENDENT_AMBULATORY_CARE_PROVIDER_SITE_OTHER): Payer: Self-pay | Admitting: Nurse Practitioner

## 2018-05-17 ENCOUNTER — Ambulatory Visit (INDEPENDENT_AMBULATORY_CARE_PROVIDER_SITE_OTHER): Payer: Medicare Other | Admitting: Nurse Practitioner

## 2018-05-17 ENCOUNTER — Ambulatory Visit (INDEPENDENT_AMBULATORY_CARE_PROVIDER_SITE_OTHER): Payer: Medicare Other

## 2018-05-17 VITALS — BP 131/71 | HR 82 | Resp 17 | Ht 61.0 in | Wt 128.0 lb

## 2018-05-17 DIAGNOSIS — J45909 Unspecified asthma, uncomplicated: Secondary | ICD-10-CM | POA: Diagnosis not present

## 2018-05-17 DIAGNOSIS — E1142 Type 2 diabetes mellitus with diabetic polyneuropathy: Secondary | ICD-10-CM | POA: Diagnosis not present

## 2018-05-17 DIAGNOSIS — R209 Unspecified disturbances of skin sensation: Secondary | ICD-10-CM

## 2018-05-17 DIAGNOSIS — Z794 Long term (current) use of insulin: Secondary | ICD-10-CM | POA: Diagnosis not present

## 2018-05-17 DIAGNOSIS — M792 Neuralgia and neuritis, unspecified: Secondary | ICD-10-CM | POA: Diagnosis not present

## 2018-05-17 DIAGNOSIS — I110 Hypertensive heart disease with heart failure: Secondary | ICD-10-CM | POA: Diagnosis not present

## 2018-05-17 DIAGNOSIS — I4892 Unspecified atrial flutter: Secondary | ICD-10-CM | POA: Diagnosis not present

## 2018-05-17 DIAGNOSIS — E1165 Type 2 diabetes mellitus with hyperglycemia: Secondary | ICD-10-CM | POA: Diagnosis not present

## 2018-05-17 DIAGNOSIS — I5022 Chronic systolic (congestive) heart failure: Secondary | ICD-10-CM | POA: Diagnosis not present

## 2018-05-17 DIAGNOSIS — I480 Paroxysmal atrial fibrillation: Secondary | ICD-10-CM | POA: Diagnosis not present

## 2018-05-17 NOTE — Telephone Encounter (Signed)
I called [facility] & spoke with Kendrice to give verbal orders.

## 2018-05-17 NOTE — Telephone Encounter (Signed)
Copied from [place]. Topic: Quick Communication - Home Health Verbal Orders >> May 17, 2018  2:28 PM Cecelia Byars, [place] wrote: Caller/Agency:Kendrice /[facility] Number:  [phone]  Requesting Skilled Nursing/Social Work:   Frequency: 1  x week 1                     2  x week  3                     1 x week 2

## 2018-05-17 NOTE — Telephone Encounter (Signed)
Yes.  Thank you.

## 2018-05-17 NOTE — Telephone Encounter (Signed)
Ok to give verbal orders?

## 2018-05-18 ENCOUNTER — Encounter (INDEPENDENT_AMBULATORY_CARE_PROVIDER_SITE_OTHER): Payer: Self-pay | Admitting: Nurse Practitioner

## 2018-05-18 DIAGNOSIS — I110 Hypertensive heart disease with heart failure: Secondary | ICD-10-CM | POA: Diagnosis not present

## 2018-05-18 DIAGNOSIS — I480 Paroxysmal atrial fibrillation: Secondary | ICD-10-CM | POA: Diagnosis not present

## 2018-05-18 DIAGNOSIS — E1142 Type 2 diabetes mellitus with diabetic polyneuropathy: Secondary | ICD-10-CM | POA: Diagnosis not present

## 2018-05-18 DIAGNOSIS — I4892 Unspecified atrial flutter: Secondary | ICD-10-CM | POA: Diagnosis not present

## 2018-05-18 DIAGNOSIS — I5022 Chronic systolic (congestive) heart failure: Secondary | ICD-10-CM | POA: Diagnosis not present

## 2018-05-18 DIAGNOSIS — J45909 Unspecified asthma, uncomplicated: Secondary | ICD-10-CM | POA: Diagnosis not present

## 2018-05-21 DIAGNOSIS — I110 Hypertensive heart disease with heart failure: Secondary | ICD-10-CM | POA: Diagnosis not present

## 2018-05-21 DIAGNOSIS — E1142 Type 2 diabetes mellitus with diabetic polyneuropathy: Secondary | ICD-10-CM | POA: Diagnosis not present

## 2018-05-21 DIAGNOSIS — F418 Other specified anxiety disorders: Secondary | ICD-10-CM

## 2018-05-21 DIAGNOSIS — H2513 Age-related nuclear cataract, bilateral: Secondary | ICD-10-CM | POA: Diagnosis not present

## 2018-05-21 DIAGNOSIS — I5022 Chronic systolic (congestive) heart failure: Secondary | ICD-10-CM | POA: Diagnosis not present

## 2018-05-21 DIAGNOSIS — H524 Presbyopia: Secondary | ICD-10-CM | POA: Diagnosis not present

## 2018-05-21 DIAGNOSIS — I69391 Dysphagia following cerebral infarction: Secondary | ICD-10-CM | POA: Diagnosis not present

## 2018-05-21 DIAGNOSIS — I480 Paroxysmal atrial fibrillation: Secondary | ICD-10-CM | POA: Diagnosis not present

## 2018-05-21 DIAGNOSIS — Z8701 Personal history of pneumonia (recurrent): Secondary | ICD-10-CM

## 2018-05-21 DIAGNOSIS — M810 Age-related osteoporosis without current pathological fracture: Secondary | ICD-10-CM | POA: Diagnosis not present

## 2018-05-21 DIAGNOSIS — I4892 Unspecified atrial flutter: Secondary | ICD-10-CM

## 2018-05-21 DIAGNOSIS — M6281 Muscle weakness (generalized): Secondary | ICD-10-CM

## 2018-05-21 DIAGNOSIS — K219 Gastro-esophageal reflux disease without esophagitis: Secondary | ICD-10-CM

## 2018-05-21 DIAGNOSIS — Z950 Presence of cardiac pacemaker: Secondary | ICD-10-CM

## 2018-05-21 DIAGNOSIS — Z952 Presence of prosthetic heart valve: Secondary | ICD-10-CM

## 2018-05-21 DIAGNOSIS — I509 Heart failure, unspecified: Secondary | ICD-10-CM

## 2018-05-21 DIAGNOSIS — R1312 Dysphagia, oropharyngeal phase: Secondary | ICD-10-CM | POA: Diagnosis not present

## 2018-05-21 DIAGNOSIS — Z87440 Personal history of urinary (tract) infections: Secondary | ICD-10-CM

## 2018-05-21 DIAGNOSIS — Z7984 Long term (current) use of oral hypoglycemic drugs: Secondary | ICD-10-CM

## 2018-05-21 DIAGNOSIS — J45909 Unspecified asthma, uncomplicated: Secondary | ICD-10-CM | POA: Diagnosis not present

## 2018-05-21 DIAGNOSIS — E089 Diabetes mellitus due to underlying condition without complications: Secondary | ICD-10-CM | POA: Diagnosis not present

## 2018-05-21 DIAGNOSIS — Z7901 Long term (current) use of anticoagulants: Secondary | ICD-10-CM

## 2018-05-21 DIAGNOSIS — I442 Atrioventricular block, complete: Secondary | ICD-10-CM | POA: Diagnosis not present

## 2018-05-21 DIAGNOSIS — I252 Old myocardial infarction: Secondary | ICD-10-CM | POA: Diagnosis not present

## 2018-05-21 DIAGNOSIS — Z9181 History of falling: Secondary | ICD-10-CM

## 2018-05-21 DIAGNOSIS — G4700 Insomnia, unspecified: Secondary | ICD-10-CM

## 2018-05-21 DIAGNOSIS — E785 Hyperlipidemia, unspecified: Secondary | ICD-10-CM

## 2018-05-21 DIAGNOSIS — E039 Hypothyroidism, unspecified: Secondary | ICD-10-CM

## 2018-05-21 DIAGNOSIS — E119 Type 2 diabetes mellitus without complications: Secondary | ICD-10-CM | POA: Diagnosis not present

## 2018-05-21 DIAGNOSIS — E271 Primary adrenocortical insufficiency: Secondary | ICD-10-CM

## 2018-05-21 DIAGNOSIS — Z7982 Long term (current) use of aspirin: Secondary | ICD-10-CM

## 2018-05-23 ENCOUNTER — Encounter: Payer: Self-pay | Admitting: Internal Medicine

## 2018-05-23 ENCOUNTER — Ambulatory Visit (INDEPENDENT_AMBULATORY_CARE_PROVIDER_SITE_OTHER): Payer: Medicare Other | Admitting: Internal Medicine

## 2018-05-23 DIAGNOSIS — F339 Major depressive disorder, recurrent, unspecified: Secondary | ICD-10-CM | POA: Diagnosis not present

## 2018-05-23 DIAGNOSIS — I4892 Unspecified atrial flutter: Secondary | ICD-10-CM

## 2018-05-23 MED ORDER — APIXABAN 5 MG PO TABS: 5.0000 mg | ORAL_TABLET | Freq: Two times a day (BID) | ORAL | 11 refills | Status: AC

## 2018-05-23 NOTE — Patient Instructions (Addendum)
The Eliquis was started to decrease your risk of stroke because you were noted to be in atrial flutter which causes the heart not to contract as it should and results in  increased risk of stroke   Your cardiologist may be able to change your medication to one that is better covered (Tier 1 or 2 ) if you will need to be anticoagulated long term  The Remeron /Prozac combination seems to be helping!  No changes today

## 2018-05-23 NOTE — Progress Notes (Signed)
Subjective:    Patient ID: Haley Young, female    DOB: [birth year], 67 y.o.   MRN: [id number] Chief Complaint  Patient presents with  . Follow-up    Bilateral ABI f/u    HPI  Haley Young is a 67 y.o. female that presents for follow-up concerning bilateral arm and leg "coldness".  She denies any pain to her extremities however just intense coldness.  Since her last visit she reports that the coldness in her upper extremities is essentially resolved, however her feet and toes still feel extremely cold no matter what she attempts.  Since her last visit the patient was previously in a skilled nursing facility however she has transitioned home and has started working with physical therapy.  She was hospitalized on 03/21/2018 for pneumonia and was subsequently airlifted to [facility] where she was on life support for approximately 8 days.  She spent a total of 21 days in the hospital.  She states that upon waking she had hand and foot coldness.  Today she underwent bilateral ABIs with PPG's of her upper and lower extremity digits.  Her right ABI is 0.99 and her left is 1.06.  She has triphasic waveforms bilaterally.  There are no previous studies to compare this to.  Her TBI of her right lower extremity is 0.52 and her left TBI 0.40.  The PPG's of her upper extremity are all normal.  The PPG's of her lower extremity digits are dampened except for her fifth digit bilaterally.  The first second and third digit of her right lower extremity are mildly dampened whereas the fourth digit is nearly flat.  The first second third and fourth digit of her left lower extremity are also greatly dampened as well.  Past Medical History:  Diagnosis Date  . Asthma   . Chicken pox   . Depression   . Diabetes mellitus   . Frequent UTI   . GERD (gastroesophageal reflux disease)   . Heart disease   . Heart murmur   . 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 Hx of colonic polyps   .  Hyperhidrosis    especially with hot flashes    . Hyperlipidemia   . Lipoma    R axilla   . MRSA cellulitis    surgical wound infection  . Osteoporosis   . Pyelocystitis    as a child   . Thyroid disease   . Urinary incontinence     Past Surgical History:  Procedure Laterality Date  . ABDOMINAL HYSTERECTOMY  1970  . [place], 2005   pulmonary stenosis with valve replacement, Dr. Evelina Dun  . PARTIAL HYSTERECTOMY  1970  . PULMONARY VEIN STENOSIS REPAIR    . VALVE REPLACEMENT      Social History   Socioeconomic History  . Marital status: Married    Spouse name: Not on file  . Number of children: 2  . Years of education: Not on file  . Highest education level: Not on file  Occupational History  . Occupation: Retired    Fish farm manager: RETIRED  . Occupation: Press photographer   Social Needs  . Financial resource strain: Not on file  . Food insecurity:    Worry: Never true    Inability: Never true  . Transportation needs:    Medical: No    Non-medical: No  Tobacco Use  . Smoking status: Never Smoker  . Smokeless tobacco: Never Used  Substance and  Sexual Activity  . Alcohol use: Yes    Comment: occasional wine   . Drug use: No  . Sexual activity: Yes  Lifestyle  . Physical activity:    Days per week: 0 days    Minutes per session: 0 min  . Stress: Only a little  Relationships  . Social connections:    Talks on phone: Patient refused    Gets together: Patient refused    Attends religious service: Patient refused    Active member of club or organization: Patient refused    Attends meetings of clubs or organizations: Patient refused    Relationship status: Patient refused  . Intimate partner violence:    Fear of current or ex partner: No    Emotionally abused: No    Physically abused: No    Forced sexual activity: No  Other Topics Concern  . Not on file  Social History Narrative   Divorced x 2      Sister is pt here- Surveyor, minerals      Cares for 17 year old  mother      36 for daughter with Down's syndrome      Adopted son with Asberger's syndrome      G1P1      Cares for 2 disabled children     Family History  Problem Relation Age of Onset  . Coarctation of the aorta Sister   . Diabetes Mother   . COPD Mother   . Obesity Mother   . Depression Mother   . Pancreatic cancer Mother   . Alcohol abuse Father   . Cirrhosis Father   . Down syndrome Brother   . Pneumonia Brother   . Down syndrome Daughter   . Lupus Daughter   . Ulcerative colitis Daughter     Allergies  Allergen Reactions  . Oxycodone Hcl     REACTION: hallucinations  . Albuterol Anxiety    Other reaction(s): Other (See Comments) Tremors   . Oxycodone Hcl Other (See Comments)    halucinations     Review of Systems   Review of Systems: Negative Unless Checked Constitutional: [x] Weight loss  [] Fever  [] Chills Cardiac: [] Chest pain   []  Atrial Fibrillation  [] Palpitations   [] Shortness of breath when laying flat   [] Shortness of breath with exertion. Vascular:  [] Pain in legs with walking   [] Pain in legs with standing  [] History of DVT   [] Phlebitis   [] Swelling in legs   [] Varicose veins   [] Non-healing ulcers Pulmonary:   [] Uses home oxygen   [] Productive cough   [] Hemoptysis   [] Wheeze  [] COPD   [] Asthma Neurologic:  [] Dizziness   [] Seizures   [] History of stroke   [] History of TIA  [] Aphasia   [] Vissual changes   [x] Weakness or numbness in arm   [x] Weakness or numbness in leg Musculoskeletal:   [] Joint swelling   [] Joint pain   [] Low back pain  []  History of Knee Replacement Hematologic:  [] Easy bruising  [] Easy bleeding   [] Hypercoagulable state   [x] Anemic Gastrointestinal:  [] Diarrhea   [] Vomiting  [] Gastroesophageal reflux/heartburn   [] Difficulty swallowing. Genitourinary:  [x] Chronic kidney disease   [] Difficult urination  [] Anuric   [] Blood in urine Skin:  [] Rashes   [] Ulcers  Psychological:  [x] History of anxiety   []  History of major depression   []  Memory Difficulties     Objective:   Physical Exam  BP 131/71 (BP Location: Right Arm, Patient Position: Sitting)   Pulse 82   Resp  17   Ht 5\' 1"  (1.549 m)   Wt 128 lb (58.1 kg)   BMI 24.19 kg/m   Gen: WD/WN, NAD, appears much better from previous visit. Head: [place]/AT, No temporalis wasting.  Ear/Nose/Throat: Hearing grossly intact, nares w/o erythema or drainage Eyes: PER, EOMI, sclera nonicteric.  Neck: Supple, no masses.  No JVD.  Pulmonary:  Good air movement, no use of accessory muscles.  Cardiac: RRR Vascular:  Vessel Right Left  Radial Palpable Palpable  Dorsalis Pedis Palpable Palpable  Posterior Tibial Palpable Palpable   Gastrointestinal: soft, non-distended. No guarding/no peritoneal signs.  Musculoskeletal: M/S 5/5 throughout.  No deformity or atrophy.  Neurologic: Pain and light touch intact in extremities.  Symmetrical.  Speech is fluent. Motor exam as listed above. Psychiatric: Judgment intact, Mood & affect appropriate for pt's clinical situation. Dermatologic: No Venous rashes. No Ulcers Noted.  No changes consistent with cellulitis. Lymph : No Cervical lymphadenopathy, no lichenification or skin changes of chronic lymphedema.      Assessment & Plan:   1. Sensation of cold in leg Today she underwent bilateral ABIs with PPG's of her upper and lower extremity digits.  Her right ABI is 0.99 and her left is 1.06.  She has triphasic waveforms bilaterally.  There are no previous studies to compare this to.  Her TBI of her right lower extremity is 0.52 and her left TBI 0.40.  The PPG's of her upper extremity are all normal.  The PPG's of her lower extremity digits are dampened except for her fifth digit bilaterally.  The first second and third digit of her right lower extremity are mildly dampened whereas the fourth digit is nearly flat.  The first second third and fourth digit of her left lower extremity are also greatly dampened as well.   During the patient's  course of hospitalization at [place] she was on life support for about 8 days, it is reasonable to assume that she was on vasoactive substances at some point during this timeframe.  Vasoactive IV medications have a tendency to cause extreme vasoconstriction especially in the distal microvascular capillary beds such as the feet and fingertips.  Since the patient has been home and has actually been ambulating, the sensation and coldness that she is previously felt is somewhat better.  Given that she currently does not have any layering peripheral vascular disease based on her ABIs today, it is reasonable to assume that this is microvascular in nature.  Rather than trying pharmaceutical agents in order to assist her microvascular disease, we will try to utilize a rigorous exercise regimen in order to increase blood flow to her lower extremities as well as hopefully to develop some collateralization.  The patient also has physical therapy that is now coming to her home to work with her on a regular basis.  In speaking with her and her husband I have instructed him to try to walk for at least 30 minutes 4 days a week.  She is also instructed to contact the office if the pain becomes worse or she develops any wounds on her lower digits.  The patient has been working as with this plan.  - VAS [place] ABI WITH/WO TBI; Future  2. Neuropathic pain of hand, unspecified laterality The patient states that her pain is essentially gone in her left upper extremity.  However previous studies done by an outside source did show that there were some monophasic waveforms in her axillary radial and ulnar arteries.  Today during ABIs  the blood pressure in her right versus left arm different by about 20 mmHg.  We will obtain a left upper extremity arterial duplex in order to obtain a baseline, in case of future issues.  She had strong waveforms in her fingers bilaterally - VAS [place] UPPER EXTREMITY ARTERIAL DUPLEX; Future  3.  Type 2 diabetes mellitus with hyperglycemia, with long-term current use of insulin (HCC) Continue hypoglycemic medications as already ordered, these medications have been reviewed and there are no changes at this time.  Hgb A1C to be monitored as already arranged by primary service    Current Outpatient Medications on File Prior to Visit  Medication Sig Dispense Refill  . acetaminophen (TYLENOL) 160 MG/5ML elixir Take by mouth every 6 (six) hours as needed for fever. 20.3 mL of 160 mg/73mL    . aspirin 81 MG tablet Take 81 mg by mouth daily.     Marland Kitchen atorvastatin (LIPITOR) 40 MG tablet Take 40 mg by mouth daily.    . fludrocortisone (FLORINEF) 0.1 MG tablet Take 0.05 mg by mouth daily.   0  . FLUoxetine (PROZAC) 10 MG tablet TAKE 1 TABLET BY MOUTH ONCE DAILY 90 tablet 1  . furosemide (LASIX) 40 MG tablet Take 40 mg by mouth daily.    Marland Kitchen glipiZIDE (GLUCOTROL XL) 10 MG 24 hr tablet Take by mouth.    Marland Kitchen glucose blood (ONE TOUCH ULTRA TEST) test strip USE ONE STRIP TO CHECK GLUCOSE THREE TIMES DAILY AS DIRECTED E11.65 200 each 2  . hydrocortisone (CORTEF) 10 MG tablet Take 10 mg by mouth daily. Take at 1 PM    . hydrocortisone (CORTEF) 20 MG tablet Take 20 mg by mouth daily. Take at 8 AM    . levothyroxine (SYNTHROID, LEVOTHROID) 125 MCG tablet TAKE ONE TABLET BY MOUTH ONCE DAILY 90 tablet 3  . metFORMIN (GLUCOPHAGE) 500 MG tablet Take 1,000 mg by mouth 2 (two) times daily with a meal.     . mirtazapine (REMERON) 7.5 MG tablet Take 7.5 mg by mouth at bedtime.  0  . Potassium Chloride ER 20 MEQ TBCR Take 20 mEq by mouth daily.     . Skin Protectants, Misc. (ENDIT EX) Apply every shift and as needed    . hydrocortisone sodium succinate (SOLU-CORTEF) 100 MG SOLR injection Use 1 injection for adrenal crisis    . Prasterone, DHEA, 25 MG TABS Take 50 mg by mouth daily. Take 2 tablets to = 50 mg     No current facility-administered medications on file prior to visit.     There are no Patient  Instructions on file for this visit. Return in about 3 months (around 08/17/2018).   Kris Hartmann, NP  This note was completed with Sales executive.  Any errors are purely unintentional.

## 2018-05-23 NOTE — Progress Notes (Addendum)
Subjective:  Patient ID: Haley Young, female    DOB: June 09, 1951  Age: 67 y.o. MRN: [id number]  CC: Diagnoses of Depression, recurrent ([place]) and Atrial flutter, unspecified type [facility]) were pertinent to this visit.  HPI Haley Young presents for FOLLOW UP ON DEPRESSION following  A prolonged hospitalization  10/23:  Patient was treated with increase in dose of  remeron to 15 mg . Appetite improved,  Has gained 4 lbs,  Sleeping better as well.   Home health coming out.   PT 2   OT 2  RN  1  Week .   Recent endocrine  Follow up   a1c 7.8    c peptide 1.6 progression toward type 1 DM suggested by endocrinologist   patient and husband had questions about WHY she was prescribed Eliquis during recent hospitalization .  The cost is prohibitive. Records reqviewed in detail with patient and husband.   Outpatient Medications Prior to Visit  Medication Sig Dispense Refill  . acetaminophen (TYLENOL) 160 MG/5ML elixir Take by mouth every 6 (six) hours as needed for fever. 20.3 mL of 160 mg/75mL    . aspirin 81 MG tablet Take 81 mg by mouth daily.     Marland Kitchen atorvastatin (LIPITOR) 40 MG tablet Take 40 mg by mouth daily.    . fludrocortisone (FLORINEF) 0.1 MG tablet Take 0.05 mg by mouth daily.   0  . FLUoxetine (PROZAC) 10 MG tablet TAKE 1 TABLET BY MOUTH ONCE DAILY 90 tablet 1  . furosemide (LASIX) 40 MG tablet Take 40 mg by mouth daily.    Marland Kitchen glipiZIDE (GLUCOTROL XL) 10 MG 24 hr tablet Take by mouth.    Marland Kitchen glucose blood (ONE TOUCH ULTRA TEST) test strip USE ONE STRIP TO CHECK GLUCOSE THREE TIMES DAILY AS DIRECTED E11.65 200 each 2  . hydrocortisone (CORTEF) 10 MG tablet Take 10 mg by mouth daily. Take at 1 PM    . hydrocortisone (CORTEF) 20 MG tablet Take 20 mg by mouth daily. Take at 8 AM    . hydrocortisone sodium succinate (SOLU-CORTEF) 100 MG SOLR injection Use 1 injection for adrenal crisis    . levothyroxine (SYNTHROID, LEVOTHROID) 125 MCG tablet TAKE ONE TABLET BY MOUTH ONCE DAILY 90 tablet 3  .  metFORMIN (GLUCOPHAGE) 500 MG tablet Take 1,000 mg by mouth 2 (two) times daily with a meal.     . mirtazapine (REMERON) 7.5 MG tablet Take 7.5 mg by mouth at bedtime.  0  . Potassium Chloride ER 20 MEQ TBCR Take 20 mEq by mouth daily.     . Prasterone, DHEA, 25 MG TABS Take 50 mg by mouth daily. Take 2 tablets to = 50 mg    . Skin Protectants, Misc. (ENDIT EX) Apply every shift and as needed    . apixaban (ELIQUIS) 5 MG TABS tablet Take 5 mg by mouth 2 (two) times daily.    Marland Kitchen gabapentin (NEURONTIN) 100 MG capsule Take 100 mg by mouth at bedtime.    . Melatonin 3 MG TABS Take 6 mg by mouth. Take 2 tablets to = 6 mg    . NON FORMULARY Diet Type:  NCS, Honey Thick Liquids, Mechanical soft Chopped Meat    . senna-docusate (SENOKOT-S) 8.6-50 MG tablet Take 2 tablets by mouth daily as needed.      No facility-administered medications prior to visit.     Review of Systems;  Patient denies headache, fevers, malaise, unintentional weight loss, skin rash, eye pain, sinus  congestion and sinus pain, sore throat, dysphagia,  hemoptysis , cough, dyspnea, wheezing, chest pain, palpitations, orthopnea, edema, abdominal pain, nausea, melena, diarrhea, constipation, flank pain, dysuria, hematuria, urinary  Frequency, nocturia, numbness, tingling, seizures,  Focal weakness, Loss of consciousness,  Tremor, insomnia, depression, anxiety, and suicidal ideation.      Objective:  BP 118/66 (BP Location: Left Arm, Patient Position: Sitting, Cuff Size: Normal)   Pulse 72   Temp 97.6 F (36.4 C) (Oral)   Resp 15   Ht 5\' 1"  (1.549 m)   Wt 132 lb 6.4 oz (60.1 kg)   SpO2 98%   BMI 25.02 kg/m   BP Readings from Last 3 Encounters:  05/23/18 118/66  05/17/18 131/71  05/07/18 (!) 118/53    Wt Readings from Last 3 Encounters:  05/23/18 132 lb 6.4 oz (60.1 kg)  05/17/18 128 lb (58.1 kg)  05/07/18 128 lb 11.2 oz (58.4 kg)    General appearance: alert, cooperative and appears stated age.Less sickly appearing  today  Lungs: clear to auscultation bilaterally Heart: regular rate and rhythm, S1, S2 normal, no murmur, click, rub or gallop Abdomen: soft, non-tender; bowel sounds normal; no masses,  no organomegaly Pulses: 2+ and symmetric Skin: Skin color, texture, turgor normal. No rashes or lesions Psych: affect normal, makes good eye contact. No fidgeting,  Smiles easily.  Denies suicidal thoughts   Lab Results  Component Value Date   HGBA1C 8.4 (H) 01/17/2018   HGBA1C 7.0 (H) 05/11/2017   HGBA1C 9.1 08/21/2015    Lab Results  Component Value Date   CREATININE 0.91 05/01/2018   CREATININE 0.74 04/23/2018   CREATININE 0.51 04/20/2018    Lab Results  Component Value Date   WBC 7.3 04/23/2018   HGB 10.5 (L) 04/23/2018   HCT 34.1 (L) 04/23/2018   PLT 193 04/23/2018   GLUCOSE 161 (H) 05/01/2018   CHOL 239 (H) 04/14/2016   TRIG 162.0 (H) 04/14/2016   HDL 57.90 04/14/2016   LDLDIRECT 165.0 04/14/2016   LDLCALC 149 (H) 04/14/2016   ALT 14 03/06/2018   AST 19 03/06/2018   NA 137 05/01/2018   K 3.6 05/01/2018   CL 93 (L) 05/01/2018   CREATININE 0.91 05/01/2018   BUN 9 05/01/2018   CO2 34 (H) 05/01/2018   TSH 3.54 01/17/2018   HGBA1C 8.4 (H) 01/17/2018   MICROALBUR 1.2 05/17/2017    No results found.  Assessment & Plan:   Problem List Items Addressed This Visit    Atrial flutter ([place])    noted during recent hospitalization during which she suffered a stroke during a cardiac arrest. Eliquis was started prior to discharge.  The cost is prohibited.Marland Kitchen  She will discuss with her cardiologist in a few days.      Relevant Medications   apixaban (ELIQUIS) 5 MG TABS tablet   Depression, recurrent (HCC)    Symptoms of anorexia and fatigue have improved with higher dose of remeron..  She has gained 4 lbs.  No changes today        A total of 25 minutes of face to face time was spent with patient more than half of which was spent in counselling about the above mentioned conditions  and  coordination of care   I have discontinued Haley Young's Melatonin, senna-docusate, NON FORMULARY, and gabapentin. I have also changed her apixaban. Additionally, I am having her maintain her levothyroxine, aspirin, glucose blood, fludrocortisone, FLUoxetine, acetaminophen, atorvastatin, hydrocortisone, hydrocortisone, furosemide, metFORMIN, Potassium Chloride ER, Prasterone (DHEA), (Skin  Protectants, Misc. (ENDIT EX)), glipiZIDE, hydrocortisone sodium succinate, and mirtazapine.  Meds ordered this encounter  Medications  . apixaban (ELIQUIS) 5 MG TABS tablet    Sig: Take 1 tablet (5 mg total) by mouth 2 (two) times daily.    Dispense:  60 tablet    Refill:  11    Medications Discontinued During This Encounter  Medication Reason  . gabapentin (NEURONTIN) 100 MG capsule Error  . Melatonin 3 MG TABS Error  . NON FORMULARY Error  . senna-docusate (SENOKOT-S) 8.6-50 MG tablet Error  . apixaban (ELIQUIS) 5 MG TABS tablet Reorder    Follow-up: Return in about 6 months (around 11/21/2018).   [name], MD

## 2018-05-24 DIAGNOSIS — I480 Paroxysmal atrial fibrillation: Secondary | ICD-10-CM | POA: Diagnosis not present

## 2018-05-24 DIAGNOSIS — E1142 Type 2 diabetes mellitus with diabetic polyneuropathy: Secondary | ICD-10-CM | POA: Diagnosis not present

## 2018-05-24 DIAGNOSIS — I110 Hypertensive heart disease with heart failure: Secondary | ICD-10-CM | POA: Diagnosis not present

## 2018-05-24 DIAGNOSIS — I4892 Unspecified atrial flutter: Secondary | ICD-10-CM | POA: Diagnosis not present

## 2018-05-24 DIAGNOSIS — I5022 Chronic systolic (congestive) heart failure: Secondary | ICD-10-CM | POA: Diagnosis not present

## 2018-05-24 DIAGNOSIS — J45909 Unspecified asthma, uncomplicated: Secondary | ICD-10-CM | POA: Diagnosis not present

## 2018-05-25 DIAGNOSIS — I480 Paroxysmal atrial fibrillation: Secondary | ICD-10-CM | POA: Diagnosis not present

## 2018-05-25 DIAGNOSIS — I10 Essential (primary) hypertension: Secondary | ICD-10-CM | POA: Diagnosis not present

## 2018-05-25 DIAGNOSIS — I482 Chronic atrial fibrillation, unspecified: Secondary | ICD-10-CM | POA: Insufficient documentation

## 2018-05-25 DIAGNOSIS — Q221 Congenital pulmonary valve stenosis: Secondary | ICD-10-CM | POA: Diagnosis not present

## 2018-05-25 DIAGNOSIS — I5021 Acute systolic (congestive) heart failure: Secondary | ICD-10-CM | POA: Diagnosis not present

## 2018-05-25 NOTE — Assessment & Plan Note (Signed)
Symptoms of anorexia and fatigue have improved with higher dose of remeron..  She has gained 4 lbs.  No changes today

## 2018-05-25 NOTE — Assessment & Plan Note (Signed)
noted during recent hospitalization during which she suffered a stroke during a cardiac arrest. Eliquis was started prior to discharge.  The cost is prohibited.Marland Kitchen  She will discuss with her cardiologist in a few days.

## 2018-05-29 ENCOUNTER — Other Ambulatory Visit: Payer: Self-pay

## 2018-05-29 DIAGNOSIS — I4892 Unspecified atrial flutter: Secondary | ICD-10-CM | POA: Diagnosis not present

## 2018-05-29 DIAGNOSIS — J45909 Unspecified asthma, uncomplicated: Secondary | ICD-10-CM | POA: Diagnosis not present

## 2018-05-29 DIAGNOSIS — I5022 Chronic systolic (congestive) heart failure: Secondary | ICD-10-CM | POA: Diagnosis not present

## 2018-05-29 DIAGNOSIS — I110 Hypertensive heart disease with heart failure: Secondary | ICD-10-CM | POA: Diagnosis not present

## 2018-05-29 DIAGNOSIS — E1142 Type 2 diabetes mellitus with diabetic polyneuropathy: Secondary | ICD-10-CM | POA: Diagnosis not present

## 2018-05-29 DIAGNOSIS — I480 Paroxysmal atrial fibrillation: Secondary | ICD-10-CM | POA: Diagnosis not present

## 2018-05-30 DIAGNOSIS — I4892 Unspecified atrial flutter: Secondary | ICD-10-CM | POA: Diagnosis not present

## 2018-05-30 DIAGNOSIS — J45909 Unspecified asthma, uncomplicated: Secondary | ICD-10-CM | POA: Diagnosis not present

## 2018-05-30 DIAGNOSIS — E1142 Type 2 diabetes mellitus with diabetic polyneuropathy: Secondary | ICD-10-CM | POA: Diagnosis not present

## 2018-05-30 DIAGNOSIS — I110 Hypertensive heart disease with heart failure: Secondary | ICD-10-CM | POA: Diagnosis not present

## 2018-05-30 DIAGNOSIS — I5022 Chronic systolic (congestive) heart failure: Secondary | ICD-10-CM | POA: Diagnosis not present

## 2018-05-30 DIAGNOSIS — I480 Paroxysmal atrial fibrillation: Secondary | ICD-10-CM | POA: Diagnosis not present

## 2018-05-31 DIAGNOSIS — I4892 Unspecified atrial flutter: Secondary | ICD-10-CM | POA: Diagnosis not present

## 2018-05-31 DIAGNOSIS — J45909 Unspecified asthma, uncomplicated: Secondary | ICD-10-CM | POA: Diagnosis not present

## 2018-05-31 DIAGNOSIS — I480 Paroxysmal atrial fibrillation: Secondary | ICD-10-CM | POA: Diagnosis not present

## 2018-05-31 DIAGNOSIS — I5022 Chronic systolic (congestive) heart failure: Secondary | ICD-10-CM | POA: Diagnosis not present

## 2018-05-31 DIAGNOSIS — I110 Hypertensive heart disease with heart failure: Secondary | ICD-10-CM | POA: Diagnosis not present

## 2018-05-31 DIAGNOSIS — E1142 Type 2 diabetes mellitus with diabetic polyneuropathy: Secondary | ICD-10-CM | POA: Diagnosis not present

## 2018-06-01 DIAGNOSIS — I480 Paroxysmal atrial fibrillation: Secondary | ICD-10-CM | POA: Diagnosis not present

## 2018-06-01 DIAGNOSIS — J45909 Unspecified asthma, uncomplicated: Secondary | ICD-10-CM | POA: Diagnosis not present

## 2018-06-01 DIAGNOSIS — E1142 Type 2 diabetes mellitus with diabetic polyneuropathy: Secondary | ICD-10-CM | POA: Diagnosis not present

## 2018-06-01 DIAGNOSIS — I5022 Chronic systolic (congestive) heart failure: Secondary | ICD-10-CM | POA: Diagnosis not present

## 2018-06-01 DIAGNOSIS — I110 Hypertensive heart disease with heart failure: Secondary | ICD-10-CM | POA: Diagnosis not present

## 2018-06-01 DIAGNOSIS — I4892 Unspecified atrial flutter: Secondary | ICD-10-CM | POA: Diagnosis not present

## 2018-06-04 DIAGNOSIS — I4892 Unspecified atrial flutter: Secondary | ICD-10-CM | POA: Diagnosis not present

## 2018-06-04 DIAGNOSIS — J45909 Unspecified asthma, uncomplicated: Secondary | ICD-10-CM | POA: Diagnosis not present

## 2018-06-04 DIAGNOSIS — I480 Paroxysmal atrial fibrillation: Secondary | ICD-10-CM | POA: Diagnosis not present

## 2018-06-04 DIAGNOSIS — I110 Hypertensive heart disease with heart failure: Secondary | ICD-10-CM | POA: Diagnosis not present

## 2018-06-04 DIAGNOSIS — E1142 Type 2 diabetes mellitus with diabetic polyneuropathy: Secondary | ICD-10-CM | POA: Diagnosis not present

## 2018-06-04 DIAGNOSIS — I5022 Chronic systolic (congestive) heart failure: Secondary | ICD-10-CM | POA: Diagnosis not present

## 2018-06-05 DIAGNOSIS — E1142 Type 2 diabetes mellitus with diabetic polyneuropathy: Secondary | ICD-10-CM | POA: Diagnosis not present

## 2018-06-05 DIAGNOSIS — I4892 Unspecified atrial flutter: Secondary | ICD-10-CM | POA: Diagnosis not present

## 2018-06-05 DIAGNOSIS — J45909 Unspecified asthma, uncomplicated: Secondary | ICD-10-CM | POA: Diagnosis not present

## 2018-06-05 DIAGNOSIS — I5022 Chronic systolic (congestive) heart failure: Secondary | ICD-10-CM | POA: Diagnosis not present

## 2018-06-05 DIAGNOSIS — I110 Hypertensive heart disease with heart failure: Secondary | ICD-10-CM | POA: Diagnosis not present

## 2018-06-05 DIAGNOSIS — I480 Paroxysmal atrial fibrillation: Secondary | ICD-10-CM | POA: Diagnosis not present

## 2018-06-18 DIAGNOSIS — Z45018 Encounter for adjustment and management of other part of cardiac pacemaker: Secondary | ICD-10-CM | POA: Diagnosis not present

## 2018-06-18 DIAGNOSIS — I110 Hypertensive heart disease with heart failure: Secondary | ICD-10-CM | POA: Diagnosis not present

## 2018-06-18 DIAGNOSIS — I4892 Unspecified atrial flutter: Secondary | ICD-10-CM | POA: Diagnosis not present

## 2018-06-18 DIAGNOSIS — I480 Paroxysmal atrial fibrillation: Secondary | ICD-10-CM | POA: Diagnosis not present

## 2018-06-18 DIAGNOSIS — E1142 Type 2 diabetes mellitus with diabetic polyneuropathy: Secondary | ICD-10-CM | POA: Diagnosis not present

## 2018-06-18 DIAGNOSIS — I10 Essential (primary) hypertension: Secondary | ICD-10-CM | POA: Diagnosis not present

## 2018-06-18 DIAGNOSIS — Z950 Presence of cardiac pacemaker: Secondary | ICD-10-CM | POA: Diagnosis not present

## 2018-06-18 DIAGNOSIS — I442 Atrioventricular block, complete: Secondary | ICD-10-CM | POA: Diagnosis not present

## 2018-06-18 DIAGNOSIS — I5022 Chronic systolic (congestive) heart failure: Secondary | ICD-10-CM | POA: Diagnosis not present

## 2018-06-18 DIAGNOSIS — J45909 Unspecified asthma, uncomplicated: Secondary | ICD-10-CM | POA: Diagnosis not present

## 2018-06-19 DIAGNOSIS — J45909 Unspecified asthma, uncomplicated: Secondary | ICD-10-CM | POA: Diagnosis not present

## 2018-06-19 DIAGNOSIS — I110 Hypertensive heart disease with heart failure: Secondary | ICD-10-CM | POA: Diagnosis not present

## 2018-06-19 DIAGNOSIS — E1142 Type 2 diabetes mellitus with diabetic polyneuropathy: Secondary | ICD-10-CM | POA: Diagnosis not present

## 2018-06-19 DIAGNOSIS — I480 Paroxysmal atrial fibrillation: Secondary | ICD-10-CM | POA: Diagnosis not present

## 2018-06-19 DIAGNOSIS — I5022 Chronic systolic (congestive) heart failure: Secondary | ICD-10-CM | POA: Diagnosis not present

## 2018-06-19 DIAGNOSIS — I4892 Unspecified atrial flutter: Secondary | ICD-10-CM | POA: Diagnosis not present

## 2018-06-28 DIAGNOSIS — E063 Autoimmune thyroiditis: Secondary | ICD-10-CM | POA: Diagnosis not present

## 2018-06-28 DIAGNOSIS — E0800 Diabetes mellitus due to underlying condition with hyperosmolarity without nonketotic hyperglycemic-hyperosmolar coma (NKHHC): Secondary | ICD-10-CM | POA: Diagnosis not present

## 2018-06-28 DIAGNOSIS — M8580 Other specified disorders of bone density and structure, unspecified site: Secondary | ICD-10-CM | POA: Diagnosis not present

## 2018-06-28 DIAGNOSIS — R5383 Other fatigue: Secondary | ICD-10-CM | POA: Diagnosis not present

## 2018-06-28 DIAGNOSIS — E038 Other specified hypothyroidism: Secondary | ICD-10-CM | POA: Diagnosis not present

## 2018-06-28 DIAGNOSIS — M810 Age-related osteoporosis without current pathological fracture: Secondary | ICD-10-CM | POA: Diagnosis not present

## 2018-06-28 DIAGNOSIS — E271 Primary adrenocortical insufficiency: Secondary | ICD-10-CM | POA: Diagnosis not present

## 2018-06-29 LAB — CBC AND DIFFERENTIAL: Hemoglobin: 15.4 (ref 12.0–16.0)

## 2018-06-29 LAB — HEMOGLOBIN A1C: Hemoglobin A1C: 9.5

## 2018-07-02 DIAGNOSIS — D649 Anemia, unspecified: Secondary | ICD-10-CM

## 2018-07-06 DIAGNOSIS — D649 Anemia, unspecified: Secondary | ICD-10-CM | POA: Insufficient documentation

## 2018-07-09 ENCOUNTER — Telehealth: Payer: Self-pay | Admitting: Internal Medicine

## 2018-07-09 MED ORDER — MIRTAZAPINE 7.5 MG PO TABS: 7.5000 mg | ORAL_TABLET | Freq: Every day | ORAL | 1 refills | Status: DC

## 2018-07-09 NOTE — Telephone Encounter (Signed)
Copied from [place] [id number]. Topic: Quick Communication - Rx Refill/Question >> Jul 09, 2018  9:55 AM Blase Mess A wrote: Medication: mirtazapine (REMERON) 7.5 MG tablet [[id number]]   Has the patient contacted their pharmacy? Yes  (Agent: If no, request that the patient contact the pharmacy for the refill.) (Agent: If yes, when and what did the pharmacy advise?)  Preferred Pharmacy (with phone number or street name): [facility] [address] [place] - [place] [phone] (Phone) [phone] (Fax)    Agent: Please be advised that RX refills may take up to 3 business days. We ask that you follow-up with your pharmacy.

## 2018-07-09 NOTE — Telephone Encounter (Signed)
Remeron prescribed by historical provider ok to fill last fill 05/17/18?

## 2018-07-09 NOTE — Telephone Encounter (Signed)
Left message for Patient to return call to office [facility] nurse may advise patient.

## 2018-07-09 NOTE — Telephone Encounter (Signed)
REFILL SENT TO [facility] ,  CAN INCREASE DOSE TO 15 MG AFTER 2 WEEKS IF NEEDED FOR LOW APPETITE OR DEPRESSION

## 2018-07-12 ENCOUNTER — Ambulatory Visit (INDEPENDENT_AMBULATORY_CARE_PROVIDER_SITE_OTHER): Payer: PPO

## 2018-07-12 VITALS — BP 110/70 | HR 70 | Temp 98.3°F | Resp 15 | Ht 61.75 in | Wt 131.4 lb

## 2018-07-12 DIAGNOSIS — Z0000 Encounter for general adult medical examination without abnormal findings: Secondary | ICD-10-CM

## 2018-07-12 NOTE — Progress Notes (Addendum)
Subjective:   Haley Young is a 68 y.o. female who presents for Medicare Annual (Subsequent) preventive examination.  Review of Systems:  No ROS.  Medicare Wellness Visit. Additional risk factors are reflected in the social history. Cardiac Risk Factors include: advanced age (>35men, >61 women);diabetes mellitus     Objective:     Vitals: BP 110/70 (BP Location: Left Arm, Patient Position: Sitting, Cuff Size: Normal)   Pulse 70   Temp 98.3 F (36.8 C) (Oral)   Resp 15   Ht 5' 1.75" (1.568 m)   Wt 131 lb 6.4 oz (59.6 kg)   SpO2 98%   BMI 24.23 kg/m   Body mass index is 24.23 kg/m.  Advanced Directives 07/12/2018 04/23/2018 04/20/2018 04/19/2018 08/09/2017 07/10/2017 10/18/2016  Does Patient Have a Medical Advance Directive? No No No No Yes No No  Type of Advance Directive - - - - Press photographer;Living will - -  Does patient want to make changes to medical advance directive? - - - - No - Patient declined - -  Would patient like information on creating a medical advance directive? No - Patient declined No - Patient declined No - Patient declined No - Patient declined - No - Patient declined -    Tobacco Social History   Tobacco Use  Smoking Status Never Smoker  Smokeless Tobacco Never Used     Counseling given: Not Answered   Clinical Intake:  Pre-visit preparation completed: Yes        Diabetes: Yes(Followed by Endocrinologist)  How often do you need to have someone help you when you read instructions, pamphlets, or other written materials from your doctor or pharmacy?: 1 - Never  Interpreter Needed?: No     Past Medical History:  Diagnosis Date  . Asthma   . Chicken pox   . Depression   . Diabetes mellitus   . Frequent UTI   . GERD (gastroesophageal reflux disease)   . Heart disease   . Heart murmur   . 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 Hx of colonic polyps   . Hyperhidrosis    especially with hot  flashes    . Hyperlipidemia   . Lipoma    R axilla   . MRSA cellulitis    surgical wound infection  . Osteoporosis   . Pyelocystitis    as a child   . Thyroid disease   . Urinary incontinence    Past Surgical History:  Procedure Laterality Date  . ABDOMINAL HYSTERECTOMY  1970  . [place], 2005   pulmonary stenosis with valve replacement, Dr. Evelina Dun  . PARTIAL HYSTERECTOMY  1970  . PULMONARY VEIN STENOSIS REPAIR    . VALVE REPLACEMENT     Family History  Problem Relation Age of Onset  . Coarctation of the aorta Sister   . Diabetes Mother   . COPD Mother   . Obesity Mother   . Depression Mother   . Pancreatic cancer Mother   . Alcohol abuse Father   . Cirrhosis Father   . Down syndrome Brother   . Pneumonia Brother   . Down syndrome Daughter   . Lupus Daughter   . Ulcerative colitis Daughter   . Diabetes Maternal Aunt    Social History   Socioeconomic History  . Marital status: Married    Spouse name: Not on file  . Number of children: 2  . Years of education: Not on  file  . Highest education level: Not on file  Occupational History  . Occupation: Retired    Fish farm manager: RETIRED  . Occupation: Press photographer   Social Needs  . Financial resource strain: Not hard at all  . Food insecurity:    Worry: Never true    Inability: Never true  . Transportation needs:    Medical: No    Non-medical: No  Tobacco Use  . Smoking status: Never Smoker  . Smokeless tobacco: Never Used  Substance and Sexual Activity  . Alcohol use: Yes    Comment: occasional wine   . Drug use: No  . Sexual activity: Yes  Lifestyle  . Physical activity:    Days per week: 0 days    Minutes per session: 0 min  . Stress: Only a little  Relationships  . Social connections:    Talks on phone: Patient refused    Gets together: Patient refused    Attends religious service: Patient refused    Active member of club or organization: Patient refused    Attends meetings of clubs or  organizations: Patient refused    Relationship status: Patient refused  Other Topics Concern  . Not on file  Social History Narrative   Divorced x 2      Sister is pt here- Surveyor, minerals      Cares for 59 year old mother      64 for daughter with Down's syndrome      Adopted son with Asberger's syndrome      G1P1      Cares for 2 disabled children     Outpatient Encounter Medications as of 07/12/2018  Medication Sig  . apixaban (ELIQUIS) 5 MG TABS tablet Take 1 tablet (5 mg total) by mouth 2 (two) times daily.  Marland Kitchen aspirin 81 MG tablet Take 81 mg by mouth daily.   Marland Kitchen atorvastatin (LIPITOR) 40 MG tablet Take 40 mg by mouth daily.  . fludrocortisone (FLORINEF) 0.1 MG tablet Take 0.05 mg by mouth daily.   Marland Kitchen FLUoxetine (PROZAC) 10 MG tablet TAKE 1 TABLET BY MOUTH ONCE DAILY  . furosemide (LASIX) 40 MG tablet Take 20 mg by mouth daily.   Marland Kitchen glipiZIDE (GLUCOTROL XL) 10 MG 24 hr tablet Take by mouth.  Marland Kitchen glucose blood (ONE TOUCH ULTRA TEST) test strip USE ONE STRIP TO CHECK GLUCOSE THREE TIMES DAILY AS DIRECTED E11.65  . hydrocortisone (CORTEF) 5 MG tablet Take 5 mg by mouth 2 (two) times daily. Take at 1 PM   . HYDROCORTISONE PO Take 15 mg by mouth daily. Take at 8 AM   . hydrocortisone sodium succinate (SOLU-CORTEF) 100 MG SOLR injection Use 1 injection for adrenal crisis  . levothyroxine (SYNTHROID, LEVOTHROID) 125 MCG tablet TAKE ONE TABLET BY MOUTH ONCE DAILY  . metFORMIN (GLUCOPHAGE) 500 MG tablet Take 1,000 mg by mouth 2 (two) times daily with a meal.   . mirtazapine (REMERON) 7.5 MG tablet Take 1 tablet (7.5 mg total) by mouth at bedtime.  . Potassium Chloride ER 20 MEQ TBCR Take 20 mEq by mouth daily.   . Skin Protectants, Misc. (ENDIT EX) Apply every shift and as needed  . [DISCONTINUED] acetaminophen (TYLENOL) 160 MG/5ML elixir Take by mouth every 6 (six) hours as needed for fever. 20.3 mL of 160 mg/74mL  . [DISCONTINUED] Prasterone, DHEA, 25 MG TABS Take 50 mg by mouth daily.  Take 2 tablets to = 50 mg   No facility-administered encounter medications on file as of 07/12/2018.  Activities of Daily Living In your present state of health, do you have any difficulty performing the following activities: 07/12/2018  Hearing? N  Vision? N  Difficulty concentrating or making decisions? N  Walking or climbing stairs? Y  Dressing or bathing? N  Doing errands, shopping? N  Preparing Food and eating ? N  Comment Reports no longer cooking; husband now cooks. She self feeds.   Using the Toilet? N  In the past six months, have you accidently leaked urine? Y  Comment Managed with daily liner  Do you have problems with loss of bowel control? N  Managing your Medications? N  Managing your Finances? Y  Comment Reports husband now manages the finances.   Housekeeping or managing your Housekeeping? N  Some recent data might be hidden    Patient Care Team: [name], MD as PCP - General (Internal Medicine)    Assessment:   This is a routine wellness examination for [employer]  Reports fasting blood sugar averaging 135.   MRI scheduled 07/16/17.  6 month f/u scheduled with pcp, per last note (11/19/18).  Health Screenings  Mammogram -10/13/14 Colonoscopy -09/13/12 Bone Density -09/12/16 Glaucoma -none reported Hearing -passes the whisper test Hemoglobin A1C -06/28/18 (9.5). Care everywhere.  Cholesterol -04/14/16 (239) TSH- 01/17/18 (3.54)  Social  Alcohol intake -rare Smoking history- none Smokers in home? none Illicit drug use?none Exercise-none. She plans to resume home physical therapy exercises. Diet-regular Sexually Active-yes Multiple Partners -none  Safety  Patient feels safe at home.  Patient does have smoke detectors at home  Patient does wear sunscreen or protective clothing when in direct sunlight. Patient does wear seat belt when driving or riding with others.   Activities of Daily Living Patient can do their own household chores. Denies needing  assistance with: driving, feeding themselves, getting from bed to chair, getting to the toilet, bathing/showering, dressing, managing money, climbing flight of stairs, or preparing meals.   Depression Screen Taking remeron and prozac.  Reports combination is helping.  Appetitie is good.    Fall Screen Patient denies being afraid of falling or falling in the last year.   Memory Screen Patient denies problems with memory, misplacing items. Difficulty focusing at times but resolves itself after a short period.    Patient is alert, normal appearance, oriented to person/place/and time. Correctly identified the president of the [place], recall of 2/3 objects, and performing simple calculations.  Patient displays appropriate judgement and can read correct time from watch face.   Immunizations The following Immunizations are up to date: Influenza, shingles, pneumonia, and tetanus.   Other Providers Patient Care Team: [name], MD as PCP - General (Internal Medicine)  Exercise Activities and Dietary recommendations Current Exercise Habits: The patient does not participate in regular exercise at present  Goals      Patient Stated   . A1c lower (pt-stated)     Followed by Endocrinologist. [facility].  Last A1c 9.5, Glucose 258      Other   . Increase physical activity     Home physical therapy exercises       Fall Risk Fall Risk  07/12/2018 05/29/2018 07/10/2017 07/08/2016 03/07/2016  Falls in the past year? 0 0 No No No  Comment - Emmi Telephone Survey: data to providers prior to load - - Emmi Telephone Survey: data to providers prior to load  Number falls in past yr: - - - - -  Risk for fall due to : - - - - -  Depression Screen PHQ 2/9 Scores 07/12/2018 07/10/2017 07/08/2016 05/29/2015  PHQ - 2 Score 1 0 0 4  PHQ- 9 Score - - - 14     Cognitive Function MMSE - Mini Mental State Exam 07/10/2017 07/08/2016  Orientation to time 5 5  Orientation to Place 5 5  Registration 3 3    Attention/ Calculation 5 5  Recall 3 3  Language- name 2 objects 2 2  Language- repeat 1 1  Language- follow 3 step command 3 3  Language- read & follow direction 1 1  Write a sentence 1 1  Copy design 1 1  Total score 30 30     6CIT Screen 07/12/2018  What Year? 0 points  What month? 0 points  What time? 0 points  Count back from 20 0 points  Months in reverse 0 points  Repeat phrase 0 points  Total Score 0    Immunization History  Administered Date(s) Administered  . H1N1 08/13/2008  . Influenza Split 07/28/2011, 06/28/2012  . Influenza, High Dose Seasonal PF 04/14/2016, 07/10/2017, 04/11/2018  . Influenza,inj,Quad PF,6+ Mos 07/25/2013, 08/21/2015  . Influenza-Unspecified 07/22/2015  . Pneumococcal Conjugate-13 09/13/2013  . Pneumococcal Polysaccharide-23 07/30/2011  . Zoster 09/13/2013   Screening Tests Health Maintenance  Topic Date Due  . TETANUS/TDAP  07/25/1967  . URINE MICROALBUMIN  05/17/2018  . FOOT EXAM  04/22/2019 (Originally 09/14/2014)  . MAMMOGRAM  04/22/2019 (Originally 10/22/2016)  . OPHTHALMOLOGY EXAM  04/22/2019 (Originally 02/28/2018)  . PNA vac Low Risk Adult (2 of 2 - PPSV23) 04/22/2019 (Originally 07/29/2016)  . HEMOGLOBIN A1C  07/20/2018  . COLONOSCOPY  09/14/2022  . INFLUENZA VACCINE  Completed  . DEXA SCAN  Completed  . Hepatitis C Screening  Completed      Plan:   End of life planning; Advanced aging; Advanced directives discussed.  No HCPOA/Living Will.  Additional information declined at this time.  I have personally reviewed and noted the following in the patient's chart:   . Medical and social history . Use of alcohol, tobacco or illicit drugs  . Current medications and supplements . Functional ability and status . Nutritional status . Physical activity . Advanced directives . List of other physicians . Hospitalizations, surgeries, and ER visits in previous 12 months . Vitals . Screenings to include cognitive, depression, and  falls . Referrals and appointments  In addition, I have reviewed and discussed with patient certain preventive protocols, quality metrics, and best practice recommendations. A written personalized care plan for preventive services as well as general preventive health recommendations were provided to patient.     OBrien-Blaney, Denisa L, LPN  08/13/3433    I have reviewed the above information and agree with above.   Deborra Medina, MD

## 2018-07-12 NOTE — Patient Instructions (Addendum)
  Haley Young , Thank you for taking time to come for your Medicare Wellness Visit. I appreciate your ongoing commitment to your health goals. Please review the following plan we discussed and let me know if I can assist you in the future.   These are the goals we discussed: Goals      Patient Stated   . A1c lower (pt-stated)     Followed by Endocrinologist. [facility].  Last A1c 9.5, Glucose 258      Other   . Increase physical activity     Home physical therapy exercises       This is a list of the screening recommended for you and due dates:  Health Maintenance  Topic Date Due  . Tetanus Vaccine  07/25/1967  . Urine Protein Check  05/17/2018  . Complete foot exam   04/22/2019*  . Mammogram  04/22/2019*  . Eye exam for diabetics  04/22/2019*  . Pneumonia vaccines (2 of 2 - PPSV23) 04/22/2019*  . Hemoglobin A1C  07/20/2018  . Colon Cancer Screening  09/14/2022  . Flu Shot  Completed  . DEXA scan (bone density measurement)  Completed  .  Hepatitis C: One time screening is recommended by Center for Disease Control  (CDC) for  adults born from [birth year] through 1967.   Completed  *Topic was postponed. The date shown is not the original due date.

## 2018-07-16 DIAGNOSIS — I442 Atrioventricular block, complete: Secondary | ICD-10-CM | POA: Diagnosis not present

## 2018-07-16 DIAGNOSIS — Z950 Presence of cardiac pacemaker: Secondary | ICD-10-CM | POA: Diagnosis not present

## 2018-07-18 NOTE — Telephone Encounter (Signed)
Patient aware.

## 2018-07-26 DIAGNOSIS — M419 Scoliosis, unspecified: Secondary | ICD-10-CM | POA: Diagnosis not present

## 2018-07-26 DIAGNOSIS — M810 Age-related osteoporosis without current pathological fracture: Secondary | ICD-10-CM | POA: Diagnosis not present

## 2018-07-26 DIAGNOSIS — E118 Type 2 diabetes mellitus with unspecified complications: Secondary | ICD-10-CM | POA: Diagnosis not present

## 2018-07-26 DIAGNOSIS — E038 Other specified hypothyroidism: Secondary | ICD-10-CM | POA: Diagnosis not present

## 2018-07-26 DIAGNOSIS — Z7984 Long term (current) use of oral hypoglycemic drugs: Secondary | ICD-10-CM | POA: Diagnosis not present

## 2018-07-26 DIAGNOSIS — E271 Primary adrenocortical insufficiency: Secondary | ICD-10-CM | POA: Diagnosis not present

## 2018-07-26 DIAGNOSIS — E119 Type 2 diabetes mellitus without complications: Secondary | ICD-10-CM | POA: Diagnosis not present

## 2018-07-26 DIAGNOSIS — Z79899 Other long term (current) drug therapy: Secondary | ICD-10-CM | POA: Diagnosis not present

## 2018-07-26 DIAGNOSIS — E063 Autoimmune thyroiditis: Secondary | ICD-10-CM | POA: Diagnosis not present

## 2018-07-26 DIAGNOSIS — M47816 Spondylosis without myelopathy or radiculopathy, lumbar region: Secondary | ICD-10-CM | POA: Diagnosis not present

## 2018-07-26 DIAGNOSIS — E0800 Diabetes mellitus due to underlying condition with hyperosmolarity without nonketotic hyperglycemic-hyperosmolar coma (NKHHC): Secondary | ICD-10-CM | POA: Diagnosis not present

## 2018-07-30 ENCOUNTER — Other Ambulatory Visit: Payer: Self-pay | Admitting: Internal Medicine

## 2018-07-31 ENCOUNTER — Other Ambulatory Visit: Payer: Self-pay

## 2018-07-31 MED ORDER — POTASSIUM CHLORIDE ER 20 MEQ PO TBCR: 20.0000 meq | EXTENDED_RELEASE_TABLET | Freq: Every day | ORAL | 2 refills | Status: DC

## 2018-08-13 DIAGNOSIS — I471 Supraventricular tachycardia: Secondary | ICD-10-CM | POA: Diagnosis not present

## 2018-08-13 DIAGNOSIS — I371 Nonrheumatic pulmonary valve insufficiency: Secondary | ICD-10-CM | POA: Diagnosis not present

## 2018-08-13 DIAGNOSIS — I5022 Chronic systolic (congestive) heart failure: Secondary | ICD-10-CM | POA: Diagnosis not present

## 2018-08-13 DIAGNOSIS — Q221 Congenital pulmonary valve stenosis: Secondary | ICD-10-CM | POA: Diagnosis not present

## 2018-08-13 DIAGNOSIS — I480 Paroxysmal atrial fibrillation: Secondary | ICD-10-CM | POA: Diagnosis not present

## 2018-08-13 DIAGNOSIS — I10 Essential (primary) hypertension: Secondary | ICD-10-CM | POA: Diagnosis not present

## 2018-08-20 ENCOUNTER — Encounter (INDEPENDENT_AMBULATORY_CARE_PROVIDER_SITE_OTHER): Payer: Self-pay | Admitting: Vascular Surgery

## 2018-08-20 ENCOUNTER — Ambulatory Visit (INDEPENDENT_AMBULATORY_CARE_PROVIDER_SITE_OTHER): Payer: PPO

## 2018-08-20 ENCOUNTER — Other Ambulatory Visit (INDEPENDENT_AMBULATORY_CARE_PROVIDER_SITE_OTHER): Payer: Self-pay | Admitting: Nurse Practitioner

## 2018-08-20 ENCOUNTER — Other Ambulatory Visit: Payer: Self-pay

## 2018-08-20 ENCOUNTER — Ambulatory Visit (INDEPENDENT_AMBULATORY_CARE_PROVIDER_SITE_OTHER): Payer: PPO | Admitting: Vascular Surgery

## 2018-08-20 VITALS — BP 101/69 | HR 91 | Resp 10 | Ht 61.0 in | Wt 136.0 lb

## 2018-08-20 DIAGNOSIS — E1165 Type 2 diabetes mellitus with hyperglycemia: Secondary | ICD-10-CM

## 2018-08-20 DIAGNOSIS — K219 Gastro-esophageal reflux disease without esophagitis: Secondary | ICD-10-CM | POA: Diagnosis not present

## 2018-08-20 DIAGNOSIS — G629 Polyneuropathy, unspecified: Secondary | ICD-10-CM

## 2018-08-20 DIAGNOSIS — M79605 Pain in left leg: Secondary | ICD-10-CM | POA: Diagnosis not present

## 2018-08-20 DIAGNOSIS — Z794 Long term (current) use of insulin: Secondary | ICD-10-CM | POA: Diagnosis not present

## 2018-08-20 DIAGNOSIS — I4892 Unspecified atrial flutter: Secondary | ICD-10-CM

## 2018-08-20 DIAGNOSIS — M79604 Pain in right leg: Secondary | ICD-10-CM

## 2018-08-20 DIAGNOSIS — I739 Peripheral vascular disease, unspecified: Secondary | ICD-10-CM

## 2018-08-20 DIAGNOSIS — M792 Neuralgia and neuritis, unspecified: Secondary | ICD-10-CM

## 2018-08-29 ENCOUNTER — Encounter (INDEPENDENT_AMBULATORY_CARE_PROVIDER_SITE_OTHER): Payer: Self-pay | Admitting: Vascular Surgery

## 2018-08-29 DIAGNOSIS — M79606 Pain in leg, unspecified: Secondary | ICD-10-CM | POA: Insufficient documentation

## 2018-08-29 NOTE — Progress Notes (Signed)
MRN : [id number]  Haley Young is a 69 y.o. (September 03, 1950) female who presents with chief complaint of  Chief Complaint  Patient presents with  . Follow-up    3 month  .  History of Present Illness:   The patient is seen for evaluation of painful lower extremities associated with numbness of the arm and cold fingers. Patient notes the symptoms are variable and not always associated with activity.  The symptoms are somewhat consistent day to day occurring on most days. The patient notes the symptoms also occur with standing and routinely seems worse as the day wears on. The these problems have been progressive over the past several years. The patient states these symptoms are causing  a negative impact on quality of life and daily activities.  The patient denies rest pain or dangling of an extremity off the side of the bed during the night for relief. No open wounds or sores at this time. No history of DVT or phlebitis. No prior interventions or surgeries.  There is a  history of back problems and DJD of the cervical, lumbar and sacral spine.   ABIs performed today demonstrate triphasic tibial signals bilaterally with normal indexes.  Duplex ultrasound of the arterial system left arm is normal.  Current Meds  Medication Sig  . apixaban (ELIQUIS) 5 MG TABS tablet Take 1 tablet (5 mg total) by mouth 2 (two) times daily.  Marland Kitchen aspirin 81 MG tablet Take 81 mg by mouth daily.   Marland Kitchen atorvastatin (LIPITOR) 40 MG tablet Take 40 mg by mouth daily.  . fludrocortisone (FLORINEF) 0.1 MG tablet Take 0.05 mg by mouth daily.   Marland Kitchen FLUoxetine (PROZAC) 10 MG tablet TAKE 1 TABLET BY MOUTH ONCE DAILY  . furosemide (LASIX) 40 MG tablet Take 20 mg by mouth daily.   Marland Kitchen glipiZIDE (GLUCOTROL XL) 10 MG 24 hr tablet Take by mouth.  Marland Kitchen glucose blood (ONE TOUCH ULTRA TEST) test strip USE ONE STRIP TO CHECK GLUCOSE THREE TIMES DAILY AS DIRECTED E11.65  . hydrocortisone (CORTEF) 5 MG tablet Take 5 mg by mouth 2 (two)  times daily. Take at 1 PM   . HYDROCORTISONE PO Take 15 mg by mouth daily. Take at 8 AM   . hydrocortisone sodium succinate (SOLU-CORTEF) 100 MG SOLR injection Use 1 injection for adrenal crisis  . levothyroxine (SYNTHROID, LEVOTHROID) 125 MCG tablet TAKE ONE TABLET BY MOUTH ONCE DAILY  . metFORMIN (GLUCOPHAGE) 500 MG tablet Take 1,000 mg by mouth 2 (two) times daily with a meal.   . mirtazapine (REMERON) 7.5 MG tablet Take 1 tablet (7.5 mg total) by mouth at bedtime.  . Potassium Chloride ER 20 MEQ TBCR Take 20 mEq by mouth daily.  . Semaglutide, 1 MG/DOSE, (OZEMPIC, 1 MG/DOSE,) 2 MG/1.5ML SOPN Inject into the skin. Inject into stomach 1 time weekly  . Skin Protectants, Misc. (ENDIT EX) Apply every shift and as needed  . traZODone (DESYREL) 150 MG tablet TAKE 1 TABLET BY MOUTH AT BEDTIME    Past Medical History:  Diagnosis Date  . Asthma   . Chicken pox   . Depression   . Diabetes mellitus   . Frequent UTI   . GERD (gastroesophageal reflux disease)   . Heart disease   . Heart murmur   . 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 Hx of colonic polyps   . Hyperhidrosis    especially with hot flashes    . Hyperlipidemia   .  Lipoma    R axilla   . MRSA cellulitis    surgical wound infection  . Osteoporosis   . Pyelocystitis    as a child   . Thyroid disease   . Urinary incontinence     Past Surgical History:  Procedure Laterality Date  . ABDOMINAL HYSTERECTOMY  1970  . [place], 2005   pulmonary stenosis with valve replacement, Dr. Evelina Dun  . PARTIAL HYSTERECTOMY  1970  . PULMONARY VEIN STENOSIS REPAIR    . VALVE REPLACEMENT      Social History Social History   Tobacco Use  . Smoking status: Never Smoker  . Smokeless tobacco: Never Used  Substance Use Topics  . Alcohol use: Yes    Comment: occasional wine   . Drug use: No    Family History Family History  Problem Relation Age of Onset  . Coarctation of the aorta Sister   .  Diabetes Mother   . COPD Mother   . Obesity Mother   . Depression Mother   . Pancreatic cancer Mother   . Alcohol abuse Father   . Cirrhosis Father   . Down syndrome Brother   . Pneumonia Brother   . Down syndrome Daughter   . Lupus Daughter   . Ulcerative colitis Daughter   . Diabetes Maternal Aunt     Allergies  Allergen Reactions  . Oxycodone Hcl     REACTION: hallucinations  . Albuterol Anxiety    Other reaction(s): Other (See Comments) Tremors   . Oxycodone Hcl Other (See Comments)    halucinations     REVIEW OF SYSTEMS (Negative unless checked)  Constitutional: [] Weight loss  [] Fever  [] Chills Cardiac: [] Chest pain   [] Chest pressure   [] Palpitations   [] Shortness of breath when laying flat   [] Shortness of breath with exertion. Vascular:  [x] Pain in legs with walking   [x] Pain in legs at rest  [] History of DVT   [] Phlebitis   [] Swelling in legs   [] Varicose veins   [] Non-healing ulcers Pulmonary:   [] Uses home oxygen   [] Productive cough   [] Hemoptysis   [] Wheeze  [] COPD   [] Asthma Neurologic:  [] Dizziness   [] Seizures   [] History of stroke   [] History of TIA  [] Aphasia   [] Vissual changes   [x] Weakness or numbness in arm   [x] Weakness or numbness in leg Musculoskeletal:   [] Joint swelling   [x] Joint pain   [x] Low back pain Hematologic:  [] Easy bruising  [] Easy bleeding   [] Hypercoagulable state   [] Anemic Gastrointestinal:  [] Diarrhea   [] Vomiting  [x] Gastroesophageal reflux/heartburn   [] Difficulty swallowing. Genitourinary:  [] Chronic kidney disease   [] Difficult urination  [] Frequent urination   [] Blood in urine Skin:  [] Rashes   [] Ulcers  Psychological:  [] History of anxiety   []  History of major depression.  Physical Examination  Vitals:   08/20/18 1424  BP: 101/69  Pulse: 91  Resp: 10  Weight: 136 lb (61.7 kg)  Height: 5\' 1"  (1.549 m)   Body mass index is 25.7 kg/m. Gen: WD/WN, NAD Head: [place]/AT, No temporalis wasting.  Ear/Nose/Throat: Hearing  grossly intact, nares w/o erythema or drainage Eyes: PER, EOMI, sclera nonicteric.  Neck: Supple, no large masses.   Pulmonary:  Good air movement, no audible wheezing bilaterally, no use of accessory muscles.  Cardiac: RRR, no JVD Vascular: Both hands and both feet are pink appear well-perfused with brisk capillary refill Vessel Right Left  Radial Palpable Palpable  Brachial Palpable Palpable  PT Palpable  Palpable  DP Palpable Palpable  Gastrointestinal: Non-distended. No guarding/no peritoneal signs.  Musculoskeletal: M/S 5/5 throughout.  No deformity or atrophy.  Neurologic: CN 2-12 intact. Symmetrical.  Speech is fluent. Motor exam as listed above. Psychiatric: Judgment intact, Mood & affect appropriate for pt's clinical situation. Dermatologic: No rashes or ulcers noted.  No changes consistent with cellulitis. Lymph : No lichenification or skin changes of chronic lymphedema.  CBC Lab Results  Component Value Date   WBC 7.3 04/23/2018   HGB 10.5 (L) 04/23/2018   HCT 34.1 (L) 04/23/2018   MCV 95.5 04/23/2018   PLT 193 04/23/2018    BMET    Component Value Date/Time   NA 137 05/01/2018 0500   NA 141 07/27/2011 0427   K 3.6 05/01/2018 0500   K 4.5 07/27/2011 0427   CL 93 (L) 05/01/2018 0500   CL 100 07/27/2011 0427   CO2 34 (H) 05/01/2018 0500   CO2 29 07/27/2011 0427   GLUCOSE 161 (H) 05/01/2018 0500   GLUCOSE 132 (H) 07/27/2011 0427   BUN 9 05/01/2018 0500   BUN 21 (H) 07/27/2011 0427   CREATININE 0.91 05/01/2018 0500   CREATININE 1.03 (H) 04/26/2016 1115   CALCIUM 7.8 (L) 05/01/2018 0500   CALCIUM 9.2 07/27/2011 0427   GFRNONAA >60 05/01/2018 0500   GFRNONAA 56 (L) 04/26/2016 1115   GFRAA >60 05/01/2018 0500   GFRAA 65 04/26/2016 1115   CrCl cannot be calculated (Patient's most recent lab result is older than the maximum 21 days allowed.).  COAG No results found for: INR, PROTIME  Radiology Vas [place] Burnard Bunting With/wo Tbi  Result Date: 08/23/2018 LOWER EXTREMITY  DOPPLER STUDY Indications: Peripheral artery disease, and cold feet and hands              Brachial pressure differences on previous exam.  Limitations: Moderate AFIB Comparison Study: 05/17/2018 Performing Technologist: Concha Norway RVT  Examination Guidelines: A complete evaluation includes at minimum, Doppler waveform signals and systolic blood pressure reading at the level of bilateral brachial, anterior tibial, and posterior tibial arteries, when vessel segments are accessible. Bilateral testing is considered an integral part of a complete examination. Photoelectric Plethysmograph (PPG) waveforms and toe systolic pressure readings are included as required and additional duplex testing as needed. Limited examinations for reoccurring indications may be performed as noted.  ABI Findings: +---------+------------------+-----+---------+--------+ Right    Rt Pressure (mmHg)IndexWaveform Comment  +---------+------------------+-----+---------+--------+ Brachial 118                                      +---------+------------------+-----+---------+--------+ ATA      133               1.13 triphasic         +---------+------------------+-----+---------+--------+ PTA      160               1.36 triphasic         +---------+------------------+-----+---------+--------+ Great Toe92                0.78 Normal            +---------+------------------+-----+---------+--------+ +---------+------------------+-----+---------+-------+ Left     Lt Pressure (mmHg)IndexWaveform Comment +---------+------------------+-----+---------+-------+ Brachial 114                                     +---------+------------------+-----+---------+-------+ ATA  123               1.04 triphasic        +---------+------------------+-----+---------+-------+ PTA      163               1.38 triphasic        +---------+------------------+-----+---------+-------+ Great Toe97                0.82  Normal           +---------+------------------+-----+---------+-------+ +-------+-----------+-----------+------------+------------+ ABI/TBIToday's ABIToday's TBIPrevious ABIPrevious TBI +-------+-----------+-----------+------------+------------+ Right  1.37       .78        .99         .52          +-------+-----------+-----------+------------+------------+ Left   1.38       .82        1.02        .40          +-------+-----------+-----------+------------+------------+ Bilateral ABIs appear essentially unchanged compared to prior study on 05/17/2018. Bilateral TBIs appear increased compared to prior study on 05/17/2018. Brachial pressures are equally the same. Patient does have moderate cardiac arrythmia.  Summary: Right: Resting right ankle-brachial index is within normal range. No evidence of significant right lower extremity arterial disease. Left: Resting left ankle-brachial index is within normal range. No evidence of significant left lower extremity arterial disease.  *See table(s) above for measurements and observations.  Electronically signed by Hortencia Pilar MD on 08/23/2018 at 5:57:18 PM.    Final    Vas [place] Upper Extremity Arterial Duplex  Result Date: 08/23/2018 UPPER EXTREMITY DUPLEX STUDY Indications: Cold fingers.  Comparison Study: none Performing Technologist: Concha Norway RVT  Examination Guidelines: A complete evaluation includes B-mode imaging, spectral Doppler, color Doppler, and power Doppler as needed of all accessible portions of each vessel. Bilateral testing is considered an integral part of a complete examination. Limited examinations for reoccurring indications may be performed as noted.  Left Doppler Findings: +----------+----------+---------+------+--------+ Site      PSV (cm/s)Waveform PlaqueComments +----------+----------+---------+------+--------+ [id number]       triphasic               +----------+----------+---------+------+--------+ Axillary   102       biphasic                +----------+----------+---------+------+--------+ Brachial  90        triphasic               +----------+----------+---------+------+--------+ Radial    79        triphasic               +----------+----------+---------+------+--------+ Ulnar     63        biphasic                +----------+----------+---------+------+--------+ Distal radial artery shows mild plaque and collateral vessels.  Summary:  Left: No obstruction visualized in the left upper extremity Distal       radial artery shows mild plaque and collateral vessels. *See table(s) above for measurements and observations. Electronically signed by Hortencia Pilar MD on 08/23/2018 at 5:57:20 PM.    Final     Assessment/Plan 1. Neuropathic pain of left hand Recommend:  I do not find evidence of Vascular pathology that would explain the patient's symptoms  The patient has atypical pain symptoms for vascular disease  Noninvasive studies including venous ultrasound of the legs do not identify vascular problems  The patient should  continue walking and begin a more formal exercise program. The patient should continue his antiplatelet therapy and aggressive treatment of the lipid abnormalities. The patient should begin wearing graduated compression socks 15-20 mmHg strength to control her mild edema.  Patient will follow-up with me in several months and we can review her spine work-up  Further work-up of her lower extremity pain is deferred to the primary service or the spine service   2. Pain in both lower extremities Recommend:  I do not find evidence of Vascular pathology that would explain the patient's symptoms  The patient has atypical pain symptoms for vascular disease  Noninvasive studies including venous ultrasound of the legs do not identify vascular problems  The patient should continue walking and begin a more formal exercise program. The patient should continue his  antiplatelet therapy and aggressive treatment of the lipid abnormalities. The patient should begin wearing graduated compression socks 15-20 mmHg strength to control her mild edema.  Patient will follow-up with me in several months and we can review her spine work-up  Further work-up of her lower extremity pain is deferred to the primary service or the spine service   3. Type 2 diabetes mellitus with hyperglycemia, with long-term current use of insulin (HCC) Continue hypoglycemic medications as already ordered, these medications have been reviewed and there are no changes at this time.  Hgb A1C to be monitored as already arranged by primary service   4. Gastroesophageal reflux disease without esophagitis Continue PPI as already ordered, this medication has been reviewed and there are no changes at this time.  Avoidence of caffeine and alcohol  Moderate elevation of the head of the bed   5. Atrial flutter, unspecified type ([place]) Continue antiarrhythmia medications as already ordered, these medications have been reviewed and there are no changes at this time.  Continue anticoagulation as ordered by Cardiology Service    Hortencia Pilar, MD  08/29/2018 4:35 PM

## 2018-08-30 LAB — BASIC METABOLIC PANEL
BUN: 16 (ref 4–21)
Creatinine: 0.8 (ref 0.5–1.1)
Glucose: 223
Potassium: 3.8 (ref 3.4–5.3)
Sodium: 139 (ref 137–147)

## 2018-08-30 LAB — CBC AND DIFFERENTIAL: WBC: 17

## 2018-08-30 LAB — TSH: TSH: 0.29 — AB (ref 0.41–5.90)

## 2018-09-18 DIAGNOSIS — Z950 Presence of cardiac pacemaker: Secondary | ICD-10-CM | POA: Diagnosis not present

## 2018-09-18 DIAGNOSIS — Z45018 Encounter for adjustment and management of other part of cardiac pacemaker: Secondary | ICD-10-CM | POA: Diagnosis not present

## 2018-09-18 DIAGNOSIS — I442 Atrioventricular block, complete: Secondary | ICD-10-CM | POA: Diagnosis not present

## 2018-09-27 DIAGNOSIS — E0800 Diabetes mellitus due to underlying condition with hyperosmolarity without nonketotic hyperglycemic-hyperosmolar coma (NKHHC): Secondary | ICD-10-CM | POA: Diagnosis not present

## 2018-10-08 ENCOUNTER — Other Ambulatory Visit: Payer: Self-pay | Admitting: Internal Medicine

## 2018-10-22 ENCOUNTER — Ambulatory Visit (INDEPENDENT_AMBULATORY_CARE_PROVIDER_SITE_OTHER): Payer: PPO | Admitting: Vascular Surgery

## 2018-10-28 ENCOUNTER — Other Ambulatory Visit: Payer: Self-pay | Admitting: Internal Medicine

## 2018-11-14 ENCOUNTER — Encounter: Payer: Self-pay | Admitting: Internal Medicine

## 2018-11-14 ENCOUNTER — Ambulatory Visit (INDEPENDENT_AMBULATORY_CARE_PROVIDER_SITE_OTHER): Payer: PPO | Admitting: Internal Medicine

## 2018-11-14 DIAGNOSIS — I442 Atrioventricular block, complete: Secondary | ICD-10-CM | POA: Diagnosis not present

## 2018-11-14 DIAGNOSIS — M79604 Pain in right leg: Secondary | ICD-10-CM

## 2018-11-14 DIAGNOSIS — I482 Chronic atrial fibrillation, unspecified: Secondary | ICD-10-CM

## 2018-11-14 DIAGNOSIS — E871 Hypo-osmolality and hyponatremia: Secondary | ICD-10-CM

## 2018-11-14 DIAGNOSIS — E039 Hypothyroidism, unspecified: Secondary | ICD-10-CM

## 2018-11-14 DIAGNOSIS — E538 Deficiency of other specified B group vitamins: Secondary | ICD-10-CM | POA: Diagnosis not present

## 2018-11-14 DIAGNOSIS — T50905A Adverse effect of unspecified drugs, medicaments and biological substances, initial encounter: Secondary | ICD-10-CM

## 2018-11-14 DIAGNOSIS — M79605 Pain in left leg: Secondary | ICD-10-CM | POA: Diagnosis not present

## 2018-11-14 DIAGNOSIS — Z794 Long term (current) use of insulin: Secondary | ICD-10-CM | POA: Diagnosis not present

## 2018-11-14 DIAGNOSIS — D519 Vitamin B12 deficiency anemia, unspecified: Secondary | ICD-10-CM | POA: Diagnosis not present

## 2018-11-14 DIAGNOSIS — E1165 Type 2 diabetes mellitus with hyperglycemia: Secondary | ICD-10-CM

## 2018-11-14 DIAGNOSIS — D649 Anemia, unspecified: Secondary | ICD-10-CM

## 2018-11-14 DIAGNOSIS — Z7189 Other specified counseling: Secondary | ICD-10-CM | POA: Diagnosis not present

## 2018-11-14 MED ORDER — LEVOTHYROXINE SODIUM 112 MCG PO TABS: 112.0000 ug | ORAL_TABLET | Freq: Every day | ORAL | 0 refills | Status: DC

## 2018-11-14 MED ORDER — GLIPIZIDE 10 MG PO TABS: 10.0000 mg | ORAL_TABLET | Freq: Two times a day (BID) | ORAL | 3 refills | Status: DC

## 2018-11-14 NOTE — Progress Notes (Signed)
Virtual Visit via [email or web address]  This visit type was conducted due to national recommendations for restrictions regarding the COVID-19 pandemic (e.g. social distancing).  This format is felt to be most appropriate for this patient at this time.  All issues noted in this document were discussed and addressed.  No physical exam was performed (except for noted visual exam findings with Video Visits).   I connected with@ on 11/14/18 at 10:30 AM EDT by a video enabled telemedicine application and verified that I am speaking with the correct person using two identifiers. Location patient: home Location provider: work or home office Persons participating in the virtual visit: patient, provider  I discussed the limitations, risks, security and privacy concerns of performing an evaluation and management service by telephone and the availability of in person appointments. I also discussed with the patient that there may be a patient responsible charge related to this service. The patient expressed understanding and agreed to proceed.   Reason for visit: 6 month follow up  On multiple issues   HPI:   1) Reviewed her workup for leg swelling and pain with a vascular evaluation in Feb 2020 :  ABI's were normal and left arm doppler normal as well.    Exercise and compression socks recommended .  Not wearing yet, but toes feel less swollen and discolored and she is taking a daily diuretic and 20 meQ potassium.   2) she saw cardiothoracic surgeon for follow up on pulmonic valve replacement at  [facility] on Feb 3 .  no changes to EF  ,  Cardiac rehab referral made for fatigue.  Continue eliquis and asa 81   BMET normal except glu 223.    k  3.9 and cr 0.8  3) DM Type 2, hypothyroidism, and adrenal insufficiency:  sees [facility] endocrine every 6 months:  A)  last   A1c c 9.5 in Dec 2019.  Fasting sugars have ben 175 average .  She often skips lunch due to anorexia.  B) thyroid dose was reduced to 112 mcg for suppressed TSH. C)  Adrenal insuff: cortisol normal Jan  TSH 0.29 T4 19.16 Jul 2018   5) The patient has no signs or symptoms of COVID 19 infection (fever, cough, sore throat  or shortness of breath beyond what is typical for patient).  Patient denies contact with other persons with the above mentioned symptoms or with anyone confirmed to have COVID 19 , but she does drive her son disabled son [place] to work daily and he does  not  wear a mask in the car with her . He works in Copy at [employer] but does have other contacts.  Advised her to wear mask when with him.   ROS: See pertinent positives and negatives per HPI.  Past Medical History:  Diagnosis Date  . Asthma   . Chicken pox   . Depression   . Diabetes mellitus   . Frequent UTI   . GERD (gastroesophageal reflux disease)   . Heart disease   . Heart murmur   . 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 Hx of colonic polyps   . Hyperhidrosis    especially with hot flashes    . Hyperlipidemia   . Lipoma    R axilla   . MRSA cellulitis    surgical wound infection  . Osteoporosis   . Pyelocystitis    as a child   . Thyroid disease   .  Urinary incontinence     Past Surgical History:  Procedure Laterality Date  . ABDOMINAL HYSTERECTOMY  1970  . [place], 2005   pulmonary stenosis with valve replacement, Dr. Evelina Dun  . PARTIAL HYSTERECTOMY  1970  . PULMONARY VEIN STENOSIS REPAIR    . VALVE REPLACEMENT      Family History  Problem Relation Age of Onset  . Coarctation of the aorta Sister   . Diabetes Mother   . COPD Mother   . Obesity Mother   . Depression Mother   . Pancreatic cancer Mother   . Alcohol abuse Father   . Cirrhosis Father   . Down syndrome Brother   . Pneumonia Brother   . Down syndrome Daughter   . Lupus Daughter   . Ulcerative colitis Daughter   . Diabetes Maternal Aunt     SOCIAL HX: married,  IADL.  Foster mother to two handicapped children    Current  Outpatient Medications:  .  apixaban (ELIQUIS) 5 MG TABS tablet, Take 1 tablet (5 mg total) by mouth 2 (two) times daily., Disp: 60 tablet, Rfl: 11 .  aspirin 81 MG tablet, Take 81 mg by mouth daily. , Disp: , Rfl:  .  atorvastatin (LIPITOR) 40 MG tablet, Take 40 mg by mouth daily., Disp: , Rfl:  .  fludrocortisone (FLORINEF) 0.1 MG tablet, Take 0.05 mg by mouth daily. , Disp: , Rfl: 0 .  FLUoxetine (PROZAC) 10 MG tablet, Take 1 tablet by mouth once daily, Disp: 90 tablet, Rfl: 1 .  furosemide (LASIX) 40 MG tablet, Take 20 mg by mouth daily. , Disp: , Rfl:  .  glucose blood (ONE TOUCH ULTRA TEST) test strip, USE ONE STRIP TO CHECK GLUCOSE THREE TIMES DAILY AS DIRECTED E11.65, Disp: 200 each, Rfl: 2 .  hydrocortisone (CORTEF) 5 MG tablet, Take 5 mg by mouth 2 (two) times daily. Take at 1 PM , Disp: , Rfl:  .  HYDROCORTISONE PO, Take 15 mg by mouth daily. Take at 8 AM , Disp: , Rfl:  .  hydrocortisone sodium succinate (SOLU-CORTEF) 100 MG SOLR injection, Use 1 injection for adrenal crisis, Disp: , Rfl:  .  levothyroxine (SYNTHROID) 112 MCG tablet, Take 1 tablet (112 mcg total) by mouth daily., Disp: 90 tablet, Rfl: 0 .  metFORMIN (GLUCOPHAGE) 500 MG tablet, Take 1,000 mg by mouth 2 (two) times daily with a meal. , Disp: , Rfl:  .  mirtazapine (REMERON) 7.5 MG tablet, Take 1 tablet (7.5 mg total) by mouth at bedtime., Disp: 90 tablet, Rfl: 1 .  Potassium Chloride ER 20 MEQ TBCR, Take 20 mEq by mouth daily., Disp: 30 tablet, Rfl: 2 .  Semaglutide, 1 MG/DOSE, (OZEMPIC, 1 MG/DOSE,) 2 MG/1.5ML SOPN, Inject into the skin. Inject into stomach 1 time weekly, Disp: , Rfl:  .  Skin Protectants, Misc. (ENDIT EX), Apply every shift and as needed, Disp: , Rfl:  .  traZODone (DESYREL) 150 MG tablet, TAKE 1 TABLET BY MOUTH AT BEDTIME, Disp: 90 tablet, Rfl: 1 .  glipiZIDE (GLUCOTROL) 10 MG tablet, Take 1 tablet (10 mg total) by mouth 2 (two) times daily before a meal., Disp: 180 tablet, Rfl: 3  EXAM:  VITALS per  patient if applicable:  GENERAL: alert, oriented, appears well and in no acute distress  HEENT: atraumatic, conjunttiva clear, no obvious abnormalities on inspection of external nose and ears  NECK: normal movements of the head and neck  LUNGS: on inspection no signs of respiratory distress,  breathing rate appears normal, no obvious gross SOB, gasping or wheezing  CV: no obvious cyanosis  MS: moves all visible extremities without noticeable abnormality  PSYCH/NEURO: pleasant and cooperative, no obvious depression or anxiety, speech and thought processing grossly intact  ASSESSMENT AND PLAN:  Discussed the following assessment and plan:  Acquired hypothyroidism - Plan: Thyroid Profile  Hyponatremia - Plan: Comprehensive metabolic panel  Type 2 diabetes mellitus with hyperglycemia, with long-term current use of insulin ([place]) - Plan: Hemoglobin A1c  Pain in both lower extremities  Anemia, unspecified type  CHB (complete heart block) (HCC)  Drug-induced vitamin B12 deficiency anemia  B12 deficiency - Plan: Vitamin B12, Intrinsic Factor Antibodies  Chronic atrial fibrillation  Educated About Covid-19 Virus Infection  Hypothyroidism Thyroid dose was reduced in Jan to 112 mcg for suppressed TSH and repeat TSH is due  Lab Results  Component Value Date   TSH 0.29 (A) 08/30/2018    Leg pain Arterial insufficiency ruled out with ABIS in feb 2020.  Venous insufficiency diagnosed, compression socks recommended.   Anemia Resolved by repeat labs  Lab Results  Component Value Date   WBC 17.0 08/30/2018   HGB 15.4 06/29/2018   HCT 34.1 (L) 04/23/2018   MCV 95.5 04/23/2018   PLT 193 04/23/2018    at [facility] in Feb   Drug-induced vitamin B12 deficiency anemia She has not been taking a b12 supplement in many months.  b12 level ordered  Lab Results  Component Value Date   VITAMINB12 235 04/14/2016     Chronic atrial fibrillation Continue eliquis for secondary  prevention of embolic CVA  Type 2 diabetes mellitus with hyperglycemia, with long-term current use of insulin ([place]) Uncontrolled since treatment of adrenal insufficiency began.  Glipizide XL changed to short acting due to recurrent anorexia and meal skipping.  Would benefit from basal insulin   Advised to  reduce dose to 5 mg for pre meal sugar < 150  and submit a log of BS in 2 weeks   Educated About Covid-19 Virus Infection Educated patient on the signs and symptoms of COVID-19 infection and ways to avoid the viral infection including washing hands frequently with soap and water,  using hand sanitizer if unable to wash, avoiding touching face,  staying at home and limiting visitors,  and avoiding contact with people coming in and out of home.  Reminded patient to call office with questions/concerns.  The importance of social distancing and use of mask around her son was discussed today    I discussed the assessment and treatment plan with the patient. The patient was provided an opportunity to ask questions and all were answered. The patient agreed with the plan and demonstrated an understanding of the instructions.   The patient was advised to call back or seek an in-person evaluation if the symptoms worsen or if the condition fails to improve as anticipated.  I provided 40 minutes of non-face-to-face time during this encounter.   [name], MD

## 2018-11-14 NOTE — Patient Instructions (Addendum)
Please  Schedule a lab appointment at the office.  You do not need to fast.  I wil be checking your thyroid and diabetes labs.  start of log of blood sugars   Twice daily.  I am interested in seeing your fastings(before breakfast)   and your  pre dinner (evening meal)   Sugar     I have changed your XL glipizide to short acting glipizide.  Take it before breakfast and before dinner.   Reduce your evening glipizide dose to 5 mg (that's 1/2 tablet) if your pre dinner sugar is 150 or less.    If you skip lunch,  Check your blood sugar at lunchtime and eat a small KIND BAR OR COOKIE IF YOUR SUGAR IS < 130  If you are not hungry and are going to skip dinner,  DO NOT TAKE ANY GLIPIZIDE UNLESS YOUR SUGAR IS >200 USING THE GUIDE below:   If you are skipping dinner, take 5 mg for SUGAR > 200 BUT LESS THAN 25O.    Take 10 mg FOR SUGAR > 250

## 2018-11-15 DIAGNOSIS — Z7189 Other specified counseling: Secondary | ICD-10-CM | POA: Insufficient documentation

## 2018-11-15 NOTE — Assessment & Plan Note (Signed)
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

## 2018-11-15 NOTE — Assessment & Plan Note (Addendum)
Uncontrolled since treatment of adrenal insufficiency began.  Glipizide XL changed to short acting due to recurrent anorexia and meal skipping.  Would benefit from basal insulin   Advised to  reduce dose to 5 mg for pre meal sugar < 150  and submit a log of BS in 2 weeks

## 2018-11-15 NOTE — Assessment & Plan Note (Signed)
Arterial insufficiency ruled out with ABIS in feb 2020.  Venous insufficiency diagnosed, compression socks recommended.

## 2018-11-15 NOTE — Assessment & Plan Note (Addendum)
She has not been taking a b12 supplement in many months.  b12 level ordered  Lab Results  Component Value Date   VITAMINB12 235 04/14/2016

## 2018-11-15 NOTE — Assessment & Plan Note (Signed)
Thyroid dose was reduced in Jan to 112 mcg for suppressed TSH and repeat TSH is due  Lab Results  Component Value Date   TSH 0.29 (A) 08/30/2018

## 2018-11-15 NOTE — Assessment & Plan Note (Signed)
Resolved by repeat labs  Lab Results  Component Value Date   WBC 17.0 08/30/2018   HGB 15.4 06/29/2018   HCT 34.1 (L) 04/23/2018   MCV 95.5 04/23/2018   PLT 193 04/23/2018    at [place] in Feb

## 2018-11-15 NOTE — Assessment & Plan Note (Signed)
Educated patient on the signs and symptoms of COVID-19 infection and ways to avoid the viral infection including washing hands frequently with soap and water,  using hand sanitizer if unable to wash, avoiding touching face,  staying at home and limiting visitors,  and avoiding contact with people coming in and out of home.  Reminded patient to call office with questions/concerns.  The importance of social distancing and use of mask around her son was discussed today

## 2018-11-19 ENCOUNTER — Ambulatory Visit: Payer: Medicare Other | Admitting: Internal Medicine

## 2018-11-27 ENCOUNTER — Other Ambulatory Visit: Payer: Self-pay

## 2018-11-27 ENCOUNTER — Other Ambulatory Visit (INDEPENDENT_AMBULATORY_CARE_PROVIDER_SITE_OTHER): Payer: PPO

## 2018-11-27 DIAGNOSIS — E538 Deficiency of other specified B group vitamins: Secondary | ICD-10-CM

## 2018-11-27 DIAGNOSIS — E871 Hypo-osmolality and hyponatremia: Secondary | ICD-10-CM | POA: Diagnosis not present

## 2018-11-27 DIAGNOSIS — E1165 Type 2 diabetes mellitus with hyperglycemia: Secondary | ICD-10-CM | POA: Diagnosis not present

## 2018-11-27 DIAGNOSIS — Z794 Long term (current) use of insulin: Secondary | ICD-10-CM

## 2018-11-27 DIAGNOSIS — E039 Hypothyroidism, unspecified: Secondary | ICD-10-CM

## 2018-11-27 LAB — COMPREHENSIVE METABOLIC PANEL
ALT: 75 U/L — ABNORMAL HIGH (ref 0–35)
AST: 64 U/L — ABNORMAL HIGH (ref 0–37)
Albumin: 3.7 g/dL (ref 3.5–5.2)
Alkaline Phosphatase: 224 U/L — ABNORMAL HIGH (ref 39–117)
BUN: 17 mg/dL (ref 6–23)
CO2: 34 mEq/L — ABNORMAL HIGH (ref 19–32)
Calcium: 9.3 mg/dL (ref 8.4–10.5)
Chloride: 97 mEq/L (ref 96–112)
Creatinine, Ser: 0.81 mg/dL (ref 0.40–1.20)
GFR: 69.83 mL/min (ref >60.00)
Glucose, Bld: 191 mg/dL — ABNORMAL HIGH (ref 70–99)
Potassium: 3.6 mEq/L (ref 3.5–5.1)
Sodium: 141 mEq/L (ref 135–145)
Total Bilirubin: 0.5 mg/dL (ref 0.2–1.2)
Total Protein: 6.9 g/dL (ref 6.0–8.3)

## 2018-11-27 LAB — HEMOGLOBIN A1C: Hgb A1c MFr Bld: 10.4 % — ABNORMAL HIGH (ref 4.6–6.5)

## 2018-11-27 LAB — VITAMIN B12: Vitamin B-12: 398 pg/mL (ref 211–911)

## 2018-11-29 ENCOUNTER — Other Ambulatory Visit: Payer: Self-pay | Admitting: Internal Medicine

## 2018-11-29 LAB — THYROID PROFILE - CHCC
Free Thyroxine Index: 4.4 — ABNORMAL HIGH (ref 1.4–3.8)
T3 Uptake: 38 % — ABNORMAL HIGH (ref 22–35)
T4, Total: 11.6 ug/dL (ref 5.1–11.9)

## 2018-11-29 LAB — INTRINSIC FACTOR ANTIBODIES: Intrinsic Factor: NEGATIVE

## 2018-12-06 ENCOUNTER — Other Ambulatory Visit: Payer: Self-pay

## 2018-12-06 NOTE — Patient Outreach (Signed)
[place] [facility]) Care Management  12/06/2018  Haley Young Feb 27, 1951 [id number]   Referral Date: 12/06/2018 Referral Source: MD referral Referral Reason: Medication assistance with Eliquis $119 and Ozempic $411.     Outreach Attempt: no answer.  HIPAA compliant voice message left.   Plan: RN CM will attempt again within 4 business days and send letter.     Jone Baseman, RN, MSN [facility] Care Management Care Management Coordinator Direct Line [phone] Toll Free: [phone]  Fax: [phone]

## 2018-12-10 ENCOUNTER — Encounter: Payer: Self-pay | Admitting: Pharmacist

## 2018-12-10 ENCOUNTER — Other Ambulatory Visit: Payer: Self-pay

## 2018-12-10 NOTE — Telephone Encounter (Signed)
This encounter was created in error - please disregard.

## 2018-12-10 NOTE — Patient Outreach (Signed)
[place] [facility]) Care Management  12/10/2018  Haley Young [birth year] [id number]   Referral Date: 12/06/2018 Referral Source: MD referral Referral Reason: Medication assistance with Eliquis $119 and Ozempic $411.     Outreach Attempt: spoke with patient. She is able to verify HIPAA. She states that she is having problems affording her Eliquis and Ozempic.  She states that she has Addison's disease and this causes her blood sugar to go up therefore she is needing the Ozempic.  Patient also reports having a pacemaker and has had valve replacements.  Patient lives in the home with her spouse and is independent with care.   Discussed [facility] services with patient.  She is agreeable to pharmacy for medication assistance.    Plan: RN CM will refer patient to pharmacy for medication assistance with Ozempic and Eliquis.   Jone Baseman, RN, MSN [place] Management Care Management Coordinator Direct Line [phone] Cell [phone] Toll Free: [phone]  Fax: [phone]

## 2018-12-14 ENCOUNTER — Other Ambulatory Visit: Payer: Self-pay | Admitting: Pharmacist

## 2018-12-14 NOTE — Patient Outreach (Signed)
[facility] [facility]) Care Management  [place]   12/14/2018  Haley Young 1951-04-09 [id number]  Reason for referral: Medication Assistance  Current insurance: [employer]  PMHx includes but not limited to:  T2DM, Addison's disease, pulmonic stenosis s/p valve replacement (2005); s/p cardiac arrest, HF, depression  Outreach:  Successful telephone call with patient.  HIPAA identifiers verified.   Subjective:   Patient reports having entered the Medicare Coverage Gap, and the costs of Eliquis and Ozempic increasing.   She also wants to discuss potential options for treatment of her diabetes. She recognizes that tight control is complicated by chronic steroid use for Addison's disease. Currently taking metformin 1000 mg BID, Ozempic 1 mg weekly, and glipizide 10 mg BID. She was previously treated with SGLT2, though developed hyperkalemia on both Jardiance 25 and Invokana 300 per notes from previous endocrinologist Dr. Cruzita Lederer. She notes that her current endocrinologist. Dr. Barbaraann Faster, has discussed insulin initiation with her, but she is not interested in a daily injection.   Reports fasting sugars ~150s currently, and is not checking during the day, as she reports getting discouraged by elevated post-prandials.   Objective:   Lab Results  Component Value Date   CREATININE 0.81 11/27/2018   CREATININE 0.8 08/30/2018   CREATININE 0.91 05/01/2018    Lab Results  Component Value Date   HGBA1C 10.4 (H) 11/27/2018    Lipid Panel     Component Value Date/Time   CHOL 239 (H) 04/14/2016 1012   TRIG 162.0 (H) 04/14/2016 1012   HDL 57.90 04/14/2016 1012   CHOLHDL 4 04/14/2016 1012   VLDL 32.4 04/14/2016 1012   LDLCALC 149 (H) 04/14/2016 1012   LDLDIRECT 165.0 04/14/2016 1012    BP Readings from Last 3 Encounters:  08/20/18 101/69  07/12/18 110/70  05/23/18 118/66    Allergies  Allergen Reactions  . Oxycodone Hcl     REACTION: hallucinations  .  Albuterol Anxiety    Other reaction(s): Other (See Comments) Tremors   . Oxycodone Hcl Other (See Comments)    halucinations    Medications Reviewed Today    Reviewed by De Hollingshead, [facility] (Pharmacist) on 12/14/18 at 1237  Med List Status: <None>  Medication Order Taking? Sig Documenting Provider Last Dose Status Informant  apixaban (ELIQUIS) 5 MG TABS tablet [id number] Yes Take 1 tablet (5 mg total) by mouth 2 (two) times daily. [name], MD Taking Active   atorvastatin (LIPITOR) 40 MG tablet [id number] Yes Take 40 mg by mouth daily. [provider] Taking Active   Fludrocortisone Acetate (FLORINEF PO) [id number] Yes Take 0.1 mg by mouth daily. [provider]  Active   FLUoxetine (PROZAC) 10 MG tablet [id number] Yes Take 1 tablet by mouth once daily [name], MD Taking Active   furosemide (LASIX) 20 MG tablet [id number] Yes Take 20 mg by mouth daily.  [provider] Taking Active Self  glipiZIDE (GLUCOTROL) 10 MG tablet [id number] Yes Take 1 tablet (10 mg total) by mouth 2 (two) times daily before a meal. [name], MD Taking Active   glucose blood (ONE TOUCH ULTRA TEST) test strip [id number] Yes USE ONE STRIP TO CHECK GLUCOSE THREE TIMES DAILY AS DIRECTED E11.65 Philemon Kingdom, MD Taking Active   hydrocortisone (CORTEF) 10 MG tablet [id number] Yes Take 5 mg by mouth 2 (two) times daily. 10 mg QAM, 5 mg lunch, 5 PM [provider] Taking Active Self        Discontinued  12/14/18 1235 (Completed Course)   hydrocortisone sodium succinate (SOLU-CORTEF) 100 MG SOLR injection [id number] No Use 1 injection for adrenal crisis [provider] Not Taking Active   levothyroxine (SYNTHROID) 112 MCG tablet [id number] Yes Take 1 tablet (112 mcg total) by mouth daily. [name], MD Taking Active   metFORMIN (GLUCOPHAGE) 500 MG tablet [id number] Yes Take 1,000 mg by mouth 2 (two) times daily with a meal.  [provider] Taking  Active   mirtazapine (REMERON) 7.5 MG tablet [id number] Yes Take 1 tablet (7.5 mg total) by mouth at bedtime. [name], MD Taking Active   Potassium Chloride ER 20 MEQ TBCR [id number] Yes Take 1 tablet by mouth once daily [name], MD Taking Active   Semaglutide, 1 MG/DOSE, (OZEMPIC, 1 MG/DOSE,) 2 MG/1.5ML SOPN [id number] Yes Inject into the skin. Inject into stomach 1 time weekly [provider] Taking Active         Discontinued 12/14/18 1237 (Completed Course)   traZODone (DESYREL) 150 MG tablet [id number] Yes TAKE 1 TABLET BY MOUTH AT BEDTIME [name], MD Taking Active           Assessment:  Medication Review Findings:   T2DM: Discussed potential future options:   Maximized on metformin, GLP1, and sulfonylurea  DPP4- no benefit on top of maximized GLP1  SGLT2- though hx hyperkalemia, this was on the higher doses of these agents. Additionally, she is currently having to take daily potassium supplementation d/t furosemide dosing - it may be that the hypokalemic effect of furosemide and the hyperkalemia effect of SGLT2 may balance out, if potassium supplementation is stopped  Discussed avoiding pioglitazone d/t CHF   Medication Assistance Findings:   Ozempic patient assistance through [employer]:   Per patient's initial report, she believes her total household income (her social security, her husband's social security, and his current wages) to be <%400 [employer]. Novo Nordisk has currently waived the out of pocket spend requirement, so she would qualify for this program  Eliquis patient assistance through [employer]: per reported income, patient and her husband are above 300% of [employer]PL; additionally, patient would need to spend 3% of yearly household income (~$1600) on copayments to qualify. She notes that her husband is only on levothyroxine, so his out of pocket spend would not assist significantly in meeting this goal  Discussed that SGLT2  medications do have patient assistance options.   Plan: . Patient will find her 2019 tax return and call me with the official yearly income. At that point, we will determine which assistance programs she will qualify for and will mail her applications. I will coordinate with appropriate disciplines (endocrinology and cardiology) . Patient plans to discuss addition of SGLT2 at endocrinology follow up later this month.  . Will route to primary care provider Dr. Derrel Nip for [place].    Catie Darnelle Maffucci, PharmD, [place] PGY2 Ambulatory Care Pharmacy Resident, [facility] Phone: [phone]

## 2018-12-18 DIAGNOSIS — Z45018 Encounter for adjustment and management of other part of cardiac pacemaker: Secondary | ICD-10-CM | POA: Diagnosis not present

## 2018-12-18 DIAGNOSIS — Z950 Presence of cardiac pacemaker: Secondary | ICD-10-CM | POA: Diagnosis not present

## 2018-12-18 DIAGNOSIS — I442 Atrioventricular block, complete: Secondary | ICD-10-CM | POA: Diagnosis not present

## 2018-12-28 ENCOUNTER — Other Ambulatory Visit: Payer: Self-pay | Admitting: Pharmacist

## 2018-12-28 NOTE — Patient Outreach (Addendum)
[facility]) Care Management  12/28/2018  DELAINEE TRAMEL Jun 12, 1951 [id number]   Contacted patient to follow up on her discussing total household income with her husband.   She spoke with him, and they realized that while he is still working, their income is over the qualifying limit for Ozempic or Eliquis patient assistance.   We discussed that in the future, if he retires, to speak with Dr. Derrel Nip or her endocrinologist about what patient assistance options exist and current requirements.   She has my contact information for any future questions or concerns.   Will close case d/t goals of care being met.   Catie Darnelle Maffucci, PharmD, [place] PGY2 Ambulatory Care Pharmacy Resident, [place] [facility] Phone: [phone]

## 2019-01-01 DIAGNOSIS — E1100 Type 2 diabetes mellitus with hyperosmolarity without nonketotic hyperglycemic-hyperosmolar coma (NKHHC): Secondary | ICD-10-CM | POA: Diagnosis not present

## 2019-01-08 ENCOUNTER — Other Ambulatory Visit: Payer: Self-pay | Admitting: Internal Medicine

## 2019-01-18 ENCOUNTER — Telehealth: Payer: Self-pay

## 2019-01-18 NOTE — Telephone Encounter (Signed)
LMTCB

## 2019-01-18 NOTE — Telephone Encounter (Signed)
Copied from [place] [id number]. Topic: General - Inquiry >> Jan 18, 2019  2:04 PM Rainey Pines A wrote: Patient would like a callback from Dr. Demetrios Isaacs nurse in regards to lab work that [employer] has told patient to get done and wants to know if she can get it done at the office.

## 2019-01-22 DIAGNOSIS — E038 Other specified hypothyroidism: Secondary | ICD-10-CM | POA: Diagnosis not present

## 2019-01-22 DIAGNOSIS — E1100 Type 2 diabetes mellitus with hyperosmolarity without nonketotic hyperglycemic-hyperosmolar coma (NKHHC): Secondary | ICD-10-CM | POA: Diagnosis not present

## 2019-01-22 DIAGNOSIS — E063 Autoimmune thyroiditis: Secondary | ICD-10-CM | POA: Diagnosis not present

## 2019-01-22 DIAGNOSIS — E7849 Other hyperlipidemia: Secondary | ICD-10-CM | POA: Diagnosis not present

## 2019-01-22 DIAGNOSIS — E271 Primary adrenocortical insufficiency: Secondary | ICD-10-CM | POA: Diagnosis not present

## 2019-01-22 DIAGNOSIS — R739 Hyperglycemia, unspecified: Secondary | ICD-10-CM | POA: Diagnosis not present

## 2019-01-24 NOTE — Telephone Encounter (Signed)
LMTCB

## 2019-02-07 ENCOUNTER — Encounter: Payer: Self-pay | Admitting: *Deleted

## 2019-02-07 ENCOUNTER — Encounter: Payer: No Typology Code available for payment source | Attending: Internal Medicine | Admitting: *Deleted

## 2019-02-07 ENCOUNTER — Other Ambulatory Visit: Payer: Self-pay

## 2019-02-07 DIAGNOSIS — I214 Non-ST elevation (NSTEMI) myocardial infarction: Secondary | ICD-10-CM

## 2019-02-07 NOTE — Progress Notes (Signed)
Virtual Orientation completed

## 2019-02-11 ENCOUNTER — Other Ambulatory Visit: Payer: Self-pay

## 2019-02-11 ENCOUNTER — Encounter: Payer: PPO | Attending: Internal Medicine

## 2019-02-11 VITALS — Ht 62.0 in | Wt 140.4 lb

## 2019-02-11 DIAGNOSIS — I214 Non-ST elevation (NSTEMI) myocardial infarction: Secondary | ICD-10-CM | POA: Diagnosis not present

## 2019-02-11 DIAGNOSIS — F329 Major depressive disorder, single episode, unspecified: Secondary | ICD-10-CM | POA: Insufficient documentation

## 2019-02-11 DIAGNOSIS — Z79899 Other long term (current) drug therapy: Secondary | ICD-10-CM | POA: Insufficient documentation

## 2019-02-11 DIAGNOSIS — E119 Type 2 diabetes mellitus without complications: Secondary | ICD-10-CM | POA: Insufficient documentation

## 2019-02-11 DIAGNOSIS — Z86010 Personal history of colonic polyps: Secondary | ICD-10-CM | POA: Diagnosis not present

## 2019-02-11 DIAGNOSIS — Z7901 Long term (current) use of anticoagulants: Secondary | ICD-10-CM | POA: Diagnosis not present

## 2019-02-11 DIAGNOSIS — Z794 Long term (current) use of insulin: Secondary | ICD-10-CM | POA: Insufficient documentation

## 2019-02-11 DIAGNOSIS — Z79890 Hormone replacement therapy (postmenopausal): Secondary | ICD-10-CM | POA: Insufficient documentation

## 2019-02-11 DIAGNOSIS — E079 Disorder of thyroid, unspecified: Secondary | ICD-10-CM | POA: Diagnosis not present

## 2019-02-11 DIAGNOSIS — J45909 Unspecified asthma, uncomplicated: Secondary | ICD-10-CM | POA: Diagnosis not present

## 2019-02-11 DIAGNOSIS — E785 Hyperlipidemia, unspecified: Secondary | ICD-10-CM | POA: Insufficient documentation

## 2019-02-11 DIAGNOSIS — Z7982 Long term (current) use of aspirin: Secondary | ICD-10-CM | POA: Diagnosis not present

## 2019-02-11 NOTE — Progress Notes (Signed)
Daily Session Note  Patient Details  Name: [name] MRN: [id number] Date of Birth: 1950/08/05 Referring Provider:     Cardiac Rehab from 02/11/2019 in [facility]  Referring Provider  JD      Encounter Date: 02/11/2019  Check In: Session Check In - 02/11/19 1227      Check-In   Supervising physician immediately available to respond to emergencies  See telemetry face sheet for immediately available ER MD    Location  [facility]-Cardiac & Pulmonary Rehab    Staff Present  Heath Lark, RN, BSN, CCRP;Ifrah Vest BS, Exercise Physiologist    Virtual Visit  No    Medication changes reported      No    Fall or balance concerns reported     No    Warm-up and Cool-down  Not performed (comment)    Resistance Training Performed  Yes    VAD Patient?  No    PAD/SET Patient?  No        Exercise Prescription Changes - 02/11/19 1200      Response to Exercise   Blood Pressure (Admit)  102/60    Blood Pressure (Exercise)  122/74    Blood Pressure (Exit)  102/64    Heart Rate (Admit)  72 bpm    Heart Rate (Exercise)  85 bpm    Heart Rate (Exit)  63 bpm    Rating of Perceived Exertion (Exercise)  11    Symptoms  no    Duration  Progress to 30 minutes of  aerobic without signs/symptoms of physical distress    Intensity  THRR unchanged       Social History   Tobacco Use  Smoking Status Never Smoker  Smokeless Tobacco Never Used    Goals Met:  Exercise tolerated well No report of cardiac concerns or symptoms Strength training completed today  Goals Unmet:  Not Applicable  Comments:  6 Minute Walk    Row Name 02/11/19 1229         6 Minute Walk   Phase  Initial     Distance  1003 feet     Walk Time  6 minutes     # of Rest Breaks  0     MPH  1.89     METS  2.05     RPE  11     VO2 Peak  7.18     Resting HR  72 bpm     Resting BP  102/60     Max Ex. HR  85 bpm     Max Ex. BP  122/74     2 Minute Post BP  102/64         Dr. Emily Filbert is  Medical Director for [place] and [facility].

## 2019-02-11 NOTE — Progress Notes (Signed)
Cardiac Individual Treatment Plan  Patient Details  Name: Haley Young MRN: [id number] Date of Birth: 1950/08/22 Referring Provider:     Cardiac Rehab from 02/11/2019 in [facility] Cardiac and Pulmonary Rehab  Referring Provider  JD      Initial Encounter Date:    Cardiac Rehab from 02/11/2019 in [facility] Cardiac and Pulmonary Rehab  Date  02/11/19      Visit Diagnosis: NSTEMI (non-ST elevation myocardial infarction) ([place]) -  Patient's Home Medications on Admission:  Current Outpatient Medications:  .  apixaban (ELIQUIS) 5 MG TABS tablet, Take 1 tablet (5 mg total) by mouth 2 (two) times daily., Disp: 60 tablet, Rfl: 11 .  aspirin 81 MG chewable tablet, Chew 81 mg by mouth daily., Disp: , Rfl:  .  atorvastatin (LIPITOR) 40 MG tablet, Take 40 mg by mouth daily., Disp: , Rfl:  .  Fludrocortisone Acetate (FLORINEF PO), Take 0.1 mg by mouth daily., Disp: , Rfl:  .  FLUoxetine (PROZAC) 10 MG tablet, Take 1 tablet by mouth once daily, Disp: 90 tablet, Rfl: 1 .  furosemide (LASIX) 20 MG tablet, Take 20 mg by mouth daily. , Disp: , Rfl:  .  glipiZIDE (GLUCOTROL) 10 MG tablet, Take 1 tablet (10 mg total) by mouth 2 (two) times daily before a meal., Disp: 180 tablet, Rfl: 3 .  glucose blood (ONE TOUCH ULTRA TEST) test strip, USE ONE STRIP TO CHECK GLUCOSE THREE TIMES DAILY AS DIRECTED E11.65, Disp: 200 each, Rfl: 2 .  hydrocortisone (CORTEF) 10 MG tablet, Take 5 mg by mouth 2 (two) times daily. 10 mg QAM, 5 mg lunch, 5 PM, Disp: , Rfl:  .  hydrocortisone sodium succinate (SOLU-CORTEF) 100 MG SOLR injection, Use 1 injection for adrenal crisis, Disp: , Rfl:  .  levothyroxine (SYNTHROID) 112 MCG tablet, Take 1 tablet (112 mcg total) by mouth daily., Disp: 90 tablet, Rfl: 0 .  metFORMIN (GLUCOPHAGE) 500 MG tablet, Take 1,000 mg by mouth 2 (two) times daily with a meal. , Disp: , Rfl:  .  mirtazapine (REMERON) 7.5 MG tablet, TAKE 1 TABLET BY MOUTH AT BEDTIME, Disp: 90 tablet, Rfl: 1 .  Potassium Chloride ER 20  MEQ TBCR, Take 1 tablet by mouth once daily, Disp: 30 tablet, Rfl: 2 .  Semaglutide, 1 MG/DOSE, (OZEMPIC, 1 MG/DOSE,) 2 MG/1.5ML SOPN, Inject into the skin. Inject into stomach 1 time weekly, Disp: , Rfl:  .  traZODone (DESYREL) 150 MG tablet, TAKE 1 TABLET BY MOUTH AT BEDTIME, Disp: 90 tablet, Rfl: 1  Past Medical History: Past Medical History:  Diagnosis Date  . Asthma   . Chicken pox   . Depression   . Diabetes mellitus   . Frequent UTI   . GERD (gastroesophageal reflux disease)   . Heart disease   . Heart murmur   . 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 Hx of colonic polyps   . Hyperhidrosis    especially with hot flashes    . Hyperlipidemia   . Lipoma    R axilla   . MRSA cellulitis    surgical wound infection  . Osteoporosis   . Pyelocystitis    as a child   . Thyroid disease   . Urinary incontinence     Tobacco Use: Social History   Tobacco Use  Smoking Status Never Smoker  Smokeless Tobacco Never Used    Labs: Recent Review Scientist, physiological    Labs for ITP Cardiac and Pulmonary Rehab  Latest Ref Rng & Units 04/14/2016 05/11/2017 01/17/2018 06/29/2018 11/27/2018   Cholestrol 0 - 200 mg/dL 239(H) - - - -   LDLCALC 0 - 99 mg/dL 149(H) - - - -   LDLDIRECT mg/dL 165.0 - - - -   HDL >39.00 mg/dL 57.90 - - - -   Trlycerides 0.0 - 149.0 mg/dL 162.0(H) - - - -   Hemoglobin A1c 4.6 - 6.5 % - 7.0(H) 8.4(H) 9.5 10.4(H)       Exercise Target Goals: Exercise Program Goal: Individual exercise prescription set using results from initial 6 min walk test and THRR while considering  patient's activity barriers and safety.   Exercise Prescription Goal: Initial exercise prescription builds to 30-45 minutes a day of aerobic activity, 2-3 days per week.  Home exercise guidelines will be given to patient during program as part of exercise prescription that the participant will acknowledge.  Activity Barriers & Risk Stratification: Activity Barriers &  Cardiac Risk Stratification - 02/07/19 1218      Activity Barriers & Cardiac Risk Stratification   Activity Barriers  Shortness of Breath   Has Addisons disease and SOB has since diagnosed   Cardiac Risk Stratification  High       6 Minute Walk: 6 Minute Walk    Row Name 02/11/19 1229         6 Minute Walk   Phase  Initial     Distance  1003 feet     Walk Time  6 minutes     # of Rest Breaks  0     MPH  1.89     METS  2.05     RPE  11     VO2 Peak  7.18     Resting HR  72 bpm     Resting BP  102/60     Max Ex. HR  85 bpm     Max Ex. BP  122/74     2 Minute Post BP  102/64        Oxygen Initial Assessment:   Oxygen Re-Evaluation:   Oxygen Discharge (Final Oxygen Re-Evaluation):   Initial Exercise Prescription: Initial Exercise Prescription - 02/11/19 1200      Date of Initial Exercise RX and Referring Provider   Date  02/11/19    Referring Provider  JD      NuStep   Level  2    SPM  60    Minutes  15    METs  2      Recumbant Elliptical   Level  1    RPM  30    Watts  20    Minutes  15    METs  2      REL-XR   Level  1    Watts  20    Speed  50    Minutes  15    METs  2      T5 Nustep   Level  1    SPM  50    Minutes  15    METs  2      Prescription Details   Frequency (times per week)  3    Duration  Progress to 30 minutes of continuous aerobic without signs/symptoms of physical distress      Intensity   THRR 40-80% of Max Heartrate  103-134    Ratings of Perceived Exertion  11-15    Perceived Dyspnea  0-4      Progression  Progression  Continue progressive overload as per policy without signs/symptoms or physical distress.      Resistance Training   Training Prescription  Yes    Weight  3    Reps  10-15       Perform Capillary Blood Glucose checks as needed.  Exercise Prescription Changes:  Exercise Prescription Changes    Row Name 02/11/19 1200             Response to Exercise   Blood Pressure (Admit)  102/60        Blood Pressure (Exercise)  122/74       Blood Pressure (Exit)  102/64       Heart Rate (Admit)  72 bpm       Heart Rate (Exercise)  85 bpm       Heart Rate (Exit)  63 bpm       Rating of Perceived Exertion (Exercise)  11       Symptoms  no       Duration  Progress to 30 minutes of  aerobic without signs/symptoms of physical distress       Intensity  THRR unchanged          Exercise Comments:   Exercise Goals and Review:  Exercise Goals    Row Name 02/11/19 1241             Exercise Goals   Increase Physical Activity  Yes       Intervention  Provide advice, education, support and counseling about physical activity/exercise needs.;Develop an individualized exercise prescription for aerobic and resistive training based on initial evaluation findings, risk stratification, comorbidities and participant's personal goals.       Expected Outcomes  Short Term: Attend rehab on a regular basis to increase amount of physical activity.;Long Term: Add in home exercise to make exercise part of routine and to increase amount of physical activity.;Long Term: Exercising regularly at least 3-5 days a week.       Increase Strength and Stamina  Yes       Intervention  Provide advice, education, support and counseling about physical activity/exercise needs.;Develop an individualized exercise prescription for aerobic and resistive training based on initial evaluation findings, risk stratification, comorbidities and participant's personal goals.       Expected Outcomes  Short Term: Increase workloads from initial exercise prescription for resistance, speed, and METs.;Short Term: Perform resistance training exercises routinely during rehab and add in resistance training at home;Long Term: Improve cardiorespiratory fitness, muscular endurance and strength as measured by increased METs and functional capacity (6MWT)       Able to understand and use rate of perceived exertion (RPE) scale  Yes        Intervention  Provide education and explanation on how to use RPE scale       Expected Outcomes  Short Term: Able to use RPE daily in rehab to express subjective intensity level;Long Term:  Able to use RPE to guide intensity level when exercising independently       Able to understand and use Dyspnea scale  Yes       Intervention  Provide education and explanation on how to use Dyspnea scale       Expected Outcomes  Short Term: Able to use Dyspnea scale daily in rehab to express subjective sense of shortness of breath during exertion;Long Term: Able to use Dyspnea scale to guide intensity level when exercising independently       Knowledge and understanding of  Target Heart Rate Range (THRR)  Yes       Intervention  Provide education and explanation of THRR including how the numbers were predicted and where they are located for reference       Expected Outcomes  Short Term: Able to state/look up THRR;Short Term: Able to use daily as guideline for intensity in rehab;Long Term: Able to use THRR to govern intensity when exercising independently       Able to check pulse independently  Yes       Intervention  Provide education and demonstration on how to check pulse in carotid and radial arteries.;Review the importance of being able to check your own pulse for safety during independent exercise       Expected Outcomes  Short Term: Able to explain why pulse checking is important during independent exercise       Understanding of Exercise Prescription  Yes       Intervention  Provide education, explanation, and written materials on patient's individual exercise prescription       Expected Outcomes  Short Term: Able to explain program exercise prescription;Long Term: Able to explain home exercise prescription to exercise independently          Exercise Goals Re-Evaluation :   Discharge Exercise Prescription (Final Exercise Prescription Changes): Exercise Prescription Changes - 02/11/19 1200       Response to Exercise   Blood Pressure (Admit)  102/60    Blood Pressure (Exercise)  122/74    Blood Pressure (Exit)  102/64    Heart Rate (Admit)  72 bpm    Heart Rate (Exercise)  85 bpm    Heart Rate (Exit)  63 bpm    Rating of Perceived Exertion (Exercise)  11    Symptoms  no    Duration  Progress to 30 minutes of  aerobic without signs/symptoms of physical distress    Intensity  THRR unchanged       Nutrition:  Target Goals: Understanding of nutrition guidelines, daily intake of sodium <1510m, cholesterol <2073m calories 30% from fat and 7% or less from saturated fats, daily to have 5 or more servings of fruits and vegetables.  Biometrics: Pre Biometrics - 02/11/19 1230      Pre Biometrics   Height  '5\' 2"'  (1.575 m)    Weight  140 lb 6.4 oz (63.7 kg)    BMI (Calculated)  25.67    Single Leg Stand  20.02 seconds        Nutrition Therapy Plan and Nutrition Goals: Nutrition Therapy & Goals - 02/11/19 1155      Nutrition Therapy   Diet  HH, DM diet    Protein (specify units)  50g    Fiber  25 grams    Whole Grain Foods  3 servings    Saturated Fats  12 max. grams    Fruits and Vegetables  5 servings/day    Sodium  1.5 grams      Personal Nutrition Goals   Nutrition Goal  ST: try to add soluble fiber and reduce fatty food intake to manage diarrhea caused by medications LT: gain strength    Comments  Pt with Addisons disease, pt reports trying to eat enough sodium. Discussed HH and DM eating and diarrhea managemnet as she reports her medication can trigger diarrhea. Pt reports eating bagel with cream cheese (whole wheat), coffee with 2% milk and sweetener. tomato sandwich on whole wheat for lunch, and meat with 2 vegetables for dinner. (pork chops  or fish (fried), chicken thighs without skin roasted)(corn, beans, peas, potatoes) pt reports also having fruit like melons (all different kinds). Pt does not tolerate bananas well.      Intervention Plan   Intervention   Prescribe, educate and counsel regarding individualized specific dietary modifications aiming towards targeted core components such as weight, hypertension, lipid management, diabetes, heart failure and other comorbidities.;Nutrition handout(s) given to patient.    Expected Outcomes  Short Term Goal: Understand basic principles of dietary content, such as calories, fat, sodium, cholesterol and nutrients.;Short Term Goal: A plan has been developed with personal nutrition goals set during dietitian appointment.;Long Term Goal: Adherence to prescribed nutrition plan.       Nutrition Assessments:   Nutrition Goals Re-Evaluation:   Nutrition Goals Discharge (Final Nutrition Goals Re-Evaluation):   Psychosocial: Target Goals: Acknowledge presence or absence of significant depression and/or stress, maximize coping skills, provide positive support system. Participant is able to verbalize types and ability to use techniques and skills needed for reducing stress and depression.   Initial Review & Psychosocial Screening: Initial Psych Review & Screening - 02/07/19 1220      Initial Review   Current issues with  Current Stress Concerns    Source of Stress Concerns  Retirement/disability;Family    Comments  Always worked and had to quit work. Not having anything to do.  COVID 19 restricting ability to socialize.   Children making sure they are carede for if something happens to her.      Family Dynamics   Good Support System?  Yes   Husband, Family, neighbors/friends and church     Barriers   Psychosocial barriers to participate in program  There are no identifiable barriers or psychosocial needs.;The patient should benefit from training in stress management and relaxation.      Screening Interventions   Interventions  Encouraged to exercise;Provide feedback about the scores to participant;To provide support and resources with identified psychosocial needs    Expected Outcomes  Short Term goal:  Utilizing psychosocial counselor, staff and physician to assist with identification of specific Stressors or current issues interfering with healing process. Setting desired goal for each stressor or current issue identified.;Long Term Goal: Stressors or current issues are controlled or eliminated.;Short Term goal: Identification and review with participant of any Quality of Life or Depression concerns found by scoring the questionnaire.;Long Term goal: The participant improves quality of Life and PHQ9 Scores as seen by post scores and/or verbalization of changes       Quality of Life Scores:   Scores of 19 and below usually indicate a poorer quality of life in these areas.  A difference of  2-3 points is a clinically meaningful difference.  A difference of 2-3 points in the total score of the Quality of Life Index has been associated with significant improvement in overall quality of life, self-image, physical symptoms, and general health in studies assessing change in quality of life.  PHQ-9: Recent Review Flowsheet Data    Depression screen [facility] 2/9 12/10/2018 07/12/2018 07/10/2017 07/08/2016 05/29/2015   Decreased Interest 0 1 0 0 2   Down, Depressed, Hopeless 1 0 0 0 2   PHQ - 2 Score 1 1 0 0 4   Altered sleeping - - - - 3   Tired, decreased energy - - - - 3   Change in appetite - - - - 1   Feeling bad or failure about yourself  - - - - 1   Trouble concentrating - - - -  1   Moving slowly or fidgety/restless - - - - 1   Suicidal thoughts - - - - 0   PHQ-9 Score - - - - 14   Difficult doing work/chores - - - - Very difficult     Interpretation of Total Score  Total Score Depression Severity:  1-4 = Minimal depression, 5-9 = Mild depression, 10-14 = Moderate depression, 15-19 = Moderately severe depression, 20-27 = Severe depression   Psychosocial Evaluation and Intervention:   Psychosocial Re-Evaluation:   Psychosocial Discharge (Final Psychosocial Re-Evaluation):   Vocational  Rehabilitation: Provide vocational rehab assistance to qualifying candidates.   Vocational Rehab Evaluation & Intervention: Vocational Rehab - 02/07/19 1224      Initial Vocational Rehab Evaluation & Intervention   Assessment shows need for Vocational Rehabilitation  No       Education: Education Goals: Education classes will be provided on a variety of topics geared toward better understanding of heart health and risk factor modification. Participant will state understanding/return demonstration of topics presented as noted by education test scores.  Learning Barriers/Preferences: Learning Barriers/Preferences - 02/07/19 1224      Learning Barriers/Preferences   Learning Barriers  None    Learning Preferences  None       Education Topics:  AED/CPR: - Group verbal and written instruction with the use of models to demonstrate the basic use of the AED with the basic ABC's of resuscitation.   General Nutrition Guidelines/Fats and Fiber: -Group instruction provided by verbal, written material, models and posters to present the general guidelines for heart healthy nutrition. Gives an explanation and review of dietary fats and fiber.   Controlling Sodium/Reading Food Labels: -Group verbal and written material supporting the discussion of sodium use in heart healthy nutrition. Review and explanation with models, verbal and written materials for utilization of the food label.   Exercise Physiology & General Exercise Guidelines: - Group verbal and written instruction with models to review the exercise physiology of the cardiovascular system and associated critical values. Provides general exercise guidelines with specific guidelines to those with heart or lung disease.    Aerobic Exercise & Resistance Training: - Gives group verbal and written instruction on the various components of exercise. Focuses on aerobic and resistive training programs and the benefits of this training and  how to safely progress through these programs..   Flexibility, Balance, Mind/Body Relaxation: Provides group verbal/written instruction on the benefits of flexibility and balance training, including mind/body exercise modes such as yoga, pilates and tai chi.  Demonstration and skill practice provided.   Stress and Anxiety: - Provides group verbal and written instruction about the health risks of elevated stress and causes of high stress.  Discuss the correlation between heart/lung disease and anxiety and treatment options. Review healthy ways to manage with stress and anxiety.   Depression: - Provides group verbal and written instruction on the correlation between heart/lung disease and depressed mood, treatment options, and the stigmas associated with seeking treatment.   Anatomy & Physiology of the Heart: - Group verbal and written instruction and models provide basic cardiac anatomy and physiology, with the coronary electrical and arterial systems. Review of Valvular disease and Heart Failure   Cardiac Procedures: - Group verbal and written instruction to review commonly prescribed medications for heart disease. Reviews the medication, class of the drug, and side effects. Includes the steps to properly store meds and maintain the prescription regimen. (beta blockers and nitrates)   Cardiac Medications I: - Group verbal and  written instruction to review commonly prescribed medications for heart disease. Reviews the medication, class of the drug, and side effects. Includes the steps to properly store meds and maintain the prescription regimen.   Cardiac Medications II: -Group verbal and written instruction to review commonly prescribed medications for heart disease. Reviews the medication, class of the drug, and side effects. (all other drug classes)    Go Sex-Intimacy & Heart Disease, Get SMART - Goal Setting: - Group verbal and written instruction through game format to discuss  heart disease and the return to sexual intimacy. Provides group verbal and written material to discuss and apply goal setting through the application of the S.M.A.R.T. Method.   Other Matters of the Heart: - Provides group verbal, written materials and models to describe Stable Angina and Peripheral Artery. Includes description of the disease process and treatment options available to the cardiac patient.   Exercise & Equipment Safety: - Individual verbal instruction and demonstration of equipment use and safety with use of the equipment.   Cardiac Rehab from 02/11/2019 in [facility] Cardiac and Pulmonary Rehab  Date  02/11/19  Educator  [place]  Instruction Review Code  1- Verbalizes Understanding      Infection Prevention: - Provides verbal and written material to individual with discussion of infection control including proper hand washing and proper equipment cleaning during exercise session.   Cardiac Rehab from 02/11/2019 in [facility] Cardiac and Pulmonary Rehab  Date  02/11/19  Educator  [place]  Instruction Review Code  1- Verbalizes Understanding      Falls Prevention: - Provides verbal and written material to individual with discussion of falls prevention and safety.   Cardiac Rehab from 02/11/2019 in [facility] Cardiac and Pulmonary Rehab  Date  02/11/19  Educator  [place]  Instruction Review Code  1- Verbalizes Understanding      Diabetes: - Individual verbal and written instruction to review signs/symptoms of diabetes, desired ranges of glucose level fasting, after meals and with exercise. Acknowledge that pre and post exercise glucose checks will be done for 3 sessions at entry of program.   Cardiac Rehab from 02/11/2019 in [facility] Cardiac and Pulmonary Rehab  Date  02/11/19  Educator  [place]  Instruction Review Code  1- Verbalizes Understanding      Know Your Numbers and Risk Factors: -Group verbal and written instruction about important numbers in your health.  Discussion of what are risk factors and  how they play a role in the disease process.  Review of Cholesterol, Blood Pressure, Diabetes, and BMI and the role they play in your overall health.   Sleep Hygiene: -Provides group verbal and written instruction about how sleep can affect your health.  Define sleep hygiene, discuss sleep cycles and impact of sleep habits. Review good sleep hygiene tips.    Other: -Provides group and verbal instruction on various topics (see comments)   Knowledge Questionnaire Score: Knowledge Questionnaire Score - 02/11/19 0909      Knowledge Questionnaire Score   Pre Score  18/26  Missed Nutrition, Angina and Exercise questions       Core Components/Risk Factors/Patient Goals at Admission: Personal Goals and Risk Factors at Admission - 02/07/19 1225      Core Components/Risk Factors/Patient Goals on Admission    Weight Management  Yes;Weight Maintenance    Intervention  Weight Management: Develop a combined nutrition and exercise program designed to reach desired caloric intake, while maintaining appropriate intake of nutrient and fiber, sodium and fats, and appropriate energy expenditure required  for the weight goal.;Weight Management: Provide education and appropriate resources to help participant work on and attain dietary goals.    Expected Outcomes  Short Term: Continue to assess and modify interventions until short term weight is achieved;Long Term: Adherence to nutrition and physical activity/exercise program aimed toward attainment of established weight goal    Diabetes  Yes    Intervention  Provide education about signs/symptoms and action to take for hypo/hyperglycemia.;Provide education about proper nutrition, including hydration, and aerobic/resistive exercise prescription along with prescribed medications to achieve blood glucose in normal ranges: Fasting glucose 65-99 mg/dL    Expected Outcomes  Short Term: Participant verbalizes understanding of the signs/symptoms and immediate care of  hyper/hypoglycemia, proper foot care and importance of medication, aerobic/resistive exercise and nutrition plan for blood glucose control.;Long Term: Attainment of HbA1C < 7%.    Hypertension  Yes    Intervention  Provide education on lifestyle modifcations including regular physical activity/exercise, weight management, moderate sodium restriction and increased consumption of fresh fruit, vegetables, and low fat dairy, alcohol moderation, and smoking cessation.;Monitor prescription use compliance.    Expected Outcomes  Short Term: Continued assessment and intervention until BP is < 140/52m HG in hypertensive participants. < 130/868mHG in hypertensive participants with diabetes, heart failure or chronic kidney disease.;Long Term: Maintenance of blood pressure at goal levels.    Lipids  Yes    Intervention  Provide education and support for participant on nutrition & aerobic/resistive exercise along with prescribed medications to achieve LDL <7076mHDL >6m74m  Expected Outcomes  Short Term: Participant states understanding of desired cholesterol values and is compliant with medications prescribed. Participant is following exercise prescription and nutrition guidelines.;Long Term: Cholesterol controlled with medications as prescribed, with individualized exercise RX and with personalized nutrition plan. Value goals: LDL < 70mg36mL > 40 mg.       Core Components/Risk Factors/Patient Goals Review:    Core Components/Risk Factors/Patient Goals at Discharge (Final Review):    ITP Comments: ITP Comments    Row Name 02/07/19 1217           ITP Comments  Orientation completed virtually today   Appointment on Mon 8/3  ro EP and RD evals and gym orientation Documentation of diagnosis can be found in [facility]          Comments: Initial ITP

## 2019-02-11 NOTE — Patient Instructions (Signed)
Patient Instructions  Patient Details  Name: Haley Young MRN: [id number] Date of Birth: [birth year] Referring Provider:  Joyice Faster, MD  Below are your personal goals for exercise, nutrition, and risk factors. Our goal is to help you stay on track towards obtaining and maintaining these goals. We will be discussing your progress on these goals with you throughout the program.  Initial Exercise Prescription: Initial Exercise Prescription - 02/11/19 1200      Date of Initial Exercise RX and Referring Provider   Date  02/11/19    Referring Provider  JD      NuStep   Level  2    SPM  60    Minutes  15    METs  2      Recumbant Elliptical   Level  1    RPM  30    Watts  20    Minutes  15    METs  2      REL-XR   Level  1    Watts  20    Speed  50    Minutes  15    METs  2      T5 Nustep   Level  1    SPM  50    Minutes  15    METs  2      Prescription Details   Frequency (times per week)  3    Duration  Progress to 30 minutes of continuous aerobic without signs/symptoms of physical distress      Intensity   THRR 40-80% of Max Heartrate  103-134    Ratings of Perceived Exertion  11-15    Perceived Dyspnea  0-4      Progression   Progression  Continue progressive overload as per policy without signs/symptoms or physical distress.      Resistance Training   Training Prescription  Yes    Weight  3    Reps  10-15       Exercise Goals: Frequency: Be able to perform aerobic exercise two to three times per week in program working toward 2-5 days per week of home exercise.  Intensity: Work with a perceived exertion of 11 (fairly light) - 15 (hard) while following your exercise prescription.  We will make changes to your prescription with you as you progress through the program.   Duration: Be able to do 30 to 45 minutes of continuous aerobic exercise in addition to a 5 minute warm-up and a 5 minute cool-down routine.   Nutrition Goals: Your personal nutrition  goals will be established when you do your nutrition analysis with the dietician.  The following are general nutrition guidelines to follow: Cholesterol < 200mg /day Sodium < 1500mg /day Fiber: Women over 50 yrs - 21 grams per day  Personal Goals: Personal Goals and Risk Factors at Admission - 02/07/19 1225      Core Components/Risk Factors/Patient Goals on Admission    Weight Management  Yes;Weight Maintenance    Intervention  Weight Management: Develop a combined nutrition and exercise program designed to reach desired caloric intake, while maintaining appropriate intake of nutrient and fiber, sodium and fats, and appropriate energy expenditure required for the weight goal.;Weight Management: Provide education and appropriate resources to help participant work on and attain dietary goals.    Expected Outcomes  Short Term: Continue to assess and modify interventions until short term weight is achieved;Long Term: Adherence to nutrition and physical activity/exercise program aimed toward attainment of established weight goal  Diabetes  Yes    Intervention  Provide education about signs/symptoms and action to take for hypo/hyperglycemia.;Provide education about proper nutrition, including hydration, and aerobic/resistive exercise prescription along with prescribed medications to achieve blood glucose in normal ranges: Fasting glucose 65-99 mg/dL    Expected Outcomes  Short Term: Participant verbalizes understanding of the signs/symptoms and immediate care of hyper/hypoglycemia, proper foot care and importance of medication, aerobic/resistive exercise and nutrition plan for blood glucose control.;Long Term: Attainment of HbA1C < 7%.    Hypertension  Yes    Intervention  Provide education on lifestyle modifcations including regular physical activity/exercise, weight management, moderate sodium restriction and increased consumption of fresh fruit, vegetables, and low fat dairy, alcohol moderation, and  smoking cessation.;Monitor prescription use compliance.    Expected Outcomes  Short Term: Continued assessment and intervention until BP is < 140/72mm HG in hypertensive participants. < 130/66mm HG in hypertensive participants with diabetes, heart failure or chronic kidney disease.;Long Term: Maintenance of blood pressure at goal levels.    Lipids  Yes    Intervention  Provide education and support for participant on nutrition & aerobic/resistive exercise along with prescribed medications to achieve LDL 70mg , HDL >40mg .    Expected Outcomes  Short Term: Participant states understanding of desired cholesterol values and is compliant with medications prescribed. Participant is following exercise prescription and nutrition guidelines.;Long Term: Cholesterol controlled with medications as prescribed, with individualized exercise RX and with personalized nutrition plan. Value goals: LDL < 70mg , HDL > 40 mg.       Tobacco Use Initial Evaluation: Social History   Tobacco Use  Smoking Status Never Smoker  Smokeless Tobacco Never Used    Exercise Goals and Review: Exercise Goals    Row Name 02/11/19 1241             Exercise Goals   Increase Physical Activity  Yes       Intervention  Provide advice, education, support and counseling about physical activity/exercise needs.;Develop an individualized exercise prescription for aerobic and resistive training based on initial evaluation findings, risk stratification, comorbidities and participant's personal goals.       Expected Outcomes  Short Term: Attend rehab on a regular basis to increase amount of physical activity.;Long Term: Add in home exercise to make exercise part of routine and to increase amount of physical activity.;Long Term: Exercising regularly at least 3-5 days a week.       Increase Strength and Stamina  Yes       Intervention  Provide advice, education, support and counseling about physical activity/exercise needs.;Develop an  individualized exercise prescription for aerobic and resistive training based on initial evaluation findings, risk stratification, comorbidities and participant's personal goals.       Expected Outcomes  Short Term: Increase workloads from initial exercise prescription for resistance, speed, and METs.;Short Term: Perform resistance training exercises routinely during rehab and add in resistance training at home;Long Term: Improve cardiorespiratory fitness, muscular endurance and strength as measured by increased METs and functional capacity (6MWT)       Able to understand and use rate of perceived exertion (RPE) scale  Yes       Intervention  Provide education and explanation on how to use RPE scale       Expected Outcomes  Short Term: Able to use RPE daily in rehab to express subjective intensity level;Long Term:  Able to use RPE to guide intensity level when exercising independently       Able to understand and use  Dyspnea scale  Yes       Intervention  Provide education and explanation on how to use Dyspnea scale       Expected Outcomes  Short Term: Able to use Dyspnea scale daily in rehab to express subjective sense of shortness of breath during exertion;Long Term: Able to use Dyspnea scale to guide intensity level when exercising independently       Knowledge and understanding of Target Heart Rate Range (THRR)  Yes       Intervention  Provide education and explanation of THRR including how the numbers were predicted and where they are located for reference       Expected Outcomes  Short Term: Able to state/look up THRR;Short Term: Able to use daily as guideline for intensity in rehab;Long Term: Able to use THRR to govern intensity when exercising independently       Able to check pulse independently  Yes       Intervention  Provide education and demonstration on how to check pulse in carotid and radial arteries.;Review the importance of being able to check your own pulse for safety during  independent exercise       Expected Outcomes  Short Term: Able to explain why pulse checking is important during independent exercise       Understanding of Exercise Prescription  Yes       Intervention  Provide education, explanation, and written materials on patient's individual exercise prescription       Expected Outcomes  Short Term: Able to explain program exercise prescription;Long Term: Able to explain home exercise prescription to exercise independently

## 2019-02-13 ENCOUNTER — Other Ambulatory Visit: Payer: Self-pay

## 2019-02-13 DIAGNOSIS — I214 Non-ST elevation (NSTEMI) myocardial infarction: Secondary | ICD-10-CM | POA: Diagnosis not present

## 2019-02-13 LAB — GLUCOSE, CAPILLARY
Glucose-Capillary: 175 mg/dL — ABNORMAL HIGH (ref 70–99)
Glucose-Capillary: 172 mg/dL — ABNORMAL HIGH (ref 70–99)

## 2019-02-13 NOTE — Progress Notes (Signed)
Daily Session Note  Patient Details  Name: Haley Young MRN: [id number] Date of Birth: [birth year] Referring Provider:     Cardiac Rehab from 02/11/2019 in [facility] Cardiac and Pulmonary Rehab  Referring Provider  JD      Encounter Date: 02/13/2019  Check In: Session Check In - 02/13/19 1033      Check-In   Supervising physician immediately available to respond to emergencies  See telemetry face sheet for immediately available ER MD    Location  [facility]-Cardiac & Pulmonary Rehab    Staff Present  Heath Lark, RN, BSN, CCRP;Meredith Sherryll Burger, RN Vickki Hearing, BA, ACSM CEP, Exercise Physiologist;Melissa Caiola RDN, LDN;Joseph Guadalupe Guerra [place]    Virtual Visit  No    Medication changes reported      No    Fall or balance concerns reported     No    Warm-up and Cool-down  Performed on first and last piece of equipment    Resistance Training Performed  Yes    VAD Patient?  No    PAD/SET Patient?  No      Pain Assessment   Currently in Pain?  No/denies    Multiple Pain Sites  No          Social History   Tobacco Use  Smoking Status Never Smoker  Smokeless Tobacco Never Used    Goals Met:  Exercise tolerated well Personal goals reviewed No report of cardiac concerns or symptoms Strength training completed today  Goals Unmet:  Not Applicable  Comments: First full day of exercise!  Patient was oriented to gym and equipment including functions, settings, policies, and procedures.  Patient's individual exercise prescription and treatment plan were reviewed.  All starting workloads were established based on the results of the 6 minute walk test done at initial orientation visit.  The plan for exercise progression was also introduced and progression will be customized based on patient's performance and goals.    Dr. Emily Filbert is Medical Director for [place] and [facility].

## 2019-02-15 ENCOUNTER — Encounter: Payer: PPO | Admitting: *Deleted

## 2019-02-15 ENCOUNTER — Other Ambulatory Visit: Payer: Self-pay

## 2019-02-15 DIAGNOSIS — I214 Non-ST elevation (NSTEMI) myocardial infarction: Secondary | ICD-10-CM

## 2019-02-15 LAB — GLUCOSE, CAPILLARY
Glucose-Capillary: 192 mg/dL — ABNORMAL HIGH (ref 70–99)
Glucose-Capillary: 204 mg/dL — ABNORMAL HIGH (ref 70–99)

## 2019-02-15 NOTE — Progress Notes (Signed)
Daily Session Note  Patient Details  Name: Haley Young MRN: [id number] Date of Birth: 07/15/1950 Referring Provider:     Cardiac Rehab from 02/11/2019 in [facility] Cardiac and Pulmonary Rehab  Referring Provider  JD      Encounter Date: 02/15/2019  Check In: Session Check In - 02/15/19 1038      Check-In   Supervising physician immediately available to respond to emergencies  See telemetry face sheet for immediately available ER MD    Location  [facility]-Cardiac & Pulmonary Rehab    Staff Present  Renita Papa, RN Vickki Hearing, BA, ACSM CEP, Exercise Physiologist;Joseph Tessie Fass RCP,RRT,BSRT    Virtual Visit  No    Medication changes reported      No    Fall or balance concerns reported     No    Warm-up and Cool-down  Performed on first and last piece of equipment    Resistance Training Performed  Yes    VAD Patient?  No    PAD/SET Patient?  No      Pain Assessment   Currently in Pain?  No/denies          Social History   Tobacco Use  Smoking Status Never Smoker  Smokeless Tobacco Never Used    Goals Met:  Independence with exercise equipment Exercise tolerated well No report of cardiac concerns or symptoms Strength training completed today  Goals Unmet:  Not Applicable  Comments: Pt able to follow exercise prescription today without complaint.  Will continue to monitor for progression.    Dr. Emily Filbert is Medical Director for [place] and [facility].

## 2019-02-18 ENCOUNTER — Other Ambulatory Visit: Payer: Self-pay

## 2019-02-18 ENCOUNTER — Encounter: Payer: PPO | Admitting: *Deleted

## 2019-02-18 DIAGNOSIS — I214 Non-ST elevation (NSTEMI) myocardial infarction: Secondary | ICD-10-CM | POA: Diagnosis not present

## 2019-02-18 LAB — GLUCOSE, CAPILLARY
Glucose-Capillary: 235 mg/dL — ABNORMAL HIGH (ref 70–99)
Glucose-Capillary: 228 mg/dL — ABNORMAL HIGH (ref 70–99)

## 2019-02-18 NOTE — Progress Notes (Signed)
Daily Session Note  Patient Details  Name: Haley Young MRN: [id number] Date of Birth: 1950-07-22 Referring Provider:     Cardiac Rehab from 02/11/2019 in [facility] Cardiac and Pulmonary Rehab  Referring Provider  JD      Encounter Date: 02/18/2019  Check In: Session Check In - 02/18/19 1107      Check-In   Supervising physician immediately available to respond to emergencies  See telemetry face sheet for immediately available ER MD    Location  [facility]-Cardiac & Pulmonary Rehab    Staff Present  Renita Papa, RN BSN;Joseph [address] [place], [place], ACSM CEP, Exercise Physiologist    Virtual Visit  No    Medication changes reported      Yes    Comments  increased Metoprolol to 59m from 50 mg    Fall or balance concerns reported     No    Warm-up and Cool-down  Performed on first and last piece of equipment    Resistance Training Performed  Yes    VAD Patient?  No    PAD/SET Patient?  No      Pain Assessment   Currently in Pain?  No/denies          Social History   Tobacco Use  Smoking Status Never Smoker  Smokeless Tobacco Never Used    Goals Met:  Independence with exercise equipment Exercise tolerated well No report of cardiac concerns or symptoms Strength training completed today  Goals Unmet:  Not Applicable  Comments: Pt able to follow exercise prescription today without complaint.  Will continue to monitor for progression.    Dr. MEmily Filbertis Medical Director for [facility].

## 2019-02-19 DIAGNOSIS — M79672 Pain in left foot: Secondary | ICD-10-CM | POA: Diagnosis not present

## 2019-02-20 ENCOUNTER — Other Ambulatory Visit: Payer: Self-pay

## 2019-02-20 ENCOUNTER — Encounter: Payer: Self-pay | Admitting: *Deleted

## 2019-02-20 ENCOUNTER — Encounter: Payer: PPO | Admitting: *Deleted

## 2019-02-20 DIAGNOSIS — I214 Non-ST elevation (NSTEMI) myocardial infarction: Secondary | ICD-10-CM

## 2019-02-20 NOTE — Progress Notes (Signed)
Daily Session Note  Patient Details  Name: BEDELIA PONG MRN: [id number] Date of Birth: Jun 22, 1951 Referring Provider:     Cardiac Rehab from 02/11/2019 in [facility] Cardiac and Pulmonary Rehab  Referring Provider  JD      Encounter Date: 02/20/2019  Check In: Session Check In - 02/20/19 1053      Check-In   Supervising physician immediately available to respond to emergencies  See telemetry face sheet for immediately available ER MD    Location  [facility]-Cardiac & Pulmonary Rehab    Staff Present  Alberteen Sam, MA, RCEP, CCRP, CCET;Joseph [place];Heath Lark, RN, BSN, CCRP;Amanda Sommer, BA, ACSM CEP, Exercise Physiologist    Virtual Visit  No    Medication changes reported      No    Warm-up and Cool-down  Performed on first and last piece of equipment    Resistance Training Performed  Yes    VAD Patient?  No    PAD/SET Patient?  No      Pain Assessment   Currently in Pain?  No/denies          Social History   Tobacco Use  Smoking Status Never Smoker  Smokeless Tobacco Never Used    Goals Met:  Exercise tolerated well No report of cardiac concerns or symptoms Strength training completed today  Goals Unmet:  Not Applicable  Comments: Pt able to follow exercise prescription today without complaint.  Will continue to monitor for progression.    Dr. Emily Filbert is Medical Director for [place] and [facility].

## 2019-02-20 NOTE — Progress Notes (Signed)
Cardiac Individual Treatment Plan  Patient Details  Name: Haley Young MRN: [id number] Date of Birth: 1951/04/14 Referring Provider:     Cardiac Rehab from 02/11/2019 in [facility] Cardiac and Pulmonary Rehab  Referring Provider  JD      Initial Encounter Date:    Cardiac Rehab from 02/11/2019 in [facility] Cardiac and Pulmonary Rehab  Date  02/11/19      Visit Diagnosis: NSTEMI (non-ST elevation myocardial infarction) [facility])  Patient's Home Medications on Admission:  Current Outpatient Medications:  .  apixaban (ELIQUIS) 5 MG TABS tablet, Take 1 tablet (5 mg total) by mouth 2 (two) times daily., Disp: 60 tablet, Rfl: 11 .  aspirin 81 MG chewable tablet, Chew 81 mg by mouth daily., Disp: , Rfl:  .  atorvastatin (LIPITOR) 40 MG tablet, Take 40 mg by mouth daily., Disp: , Rfl:  .  Fludrocortisone Acetate (FLORINEF PO), Take 0.1 mg by mouth daily., Disp: , Rfl:  .  FLUoxetine (PROZAC) 10 MG tablet, Take 1 tablet by mouth once daily, Disp: 90 tablet, Rfl: 1 .  furosemide (LASIX) 20 MG tablet, Take 20 mg by mouth daily. , Disp: , Rfl:  .  glipiZIDE (GLUCOTROL) 10 MG tablet, Take 1 tablet (10 mg total) by mouth 2 (two) times daily before a meal., Disp: 180 tablet, Rfl: 3 .  glucose blood (ONE TOUCH ULTRA TEST) test strip, USE ONE STRIP TO CHECK GLUCOSE THREE TIMES DAILY AS DIRECTED E11.65, Disp: 200 each, Rfl: 2 .  hydrocortisone (CORTEF) 10 MG tablet, Take 5 mg by mouth 2 (two) times daily. 10 mg QAM, 5 mg lunch, 5 PM, Disp: , Rfl:  .  hydrocortisone sodium succinate (SOLU-CORTEF) 100 MG SOLR injection, Use 1 injection for adrenal crisis, Disp: , Rfl:  .  levothyroxine (SYNTHROID) 112 MCG tablet, Take 1 tablet (112 mcg total) by mouth daily., Disp: 90 tablet, Rfl: 0 .  metFORMIN (GLUCOPHAGE) 500 MG tablet, Take 1,000 mg by mouth 2 (two) times daily with a meal. , Disp: , Rfl:  .  mirtazapine (REMERON) 7.5 MG tablet, TAKE 1 TABLET BY MOUTH AT BEDTIME, Disp: 90 tablet, Rfl: 1 .  Potassium Chloride ER 20  MEQ TBCR, Take 1 tablet by mouth once daily, Disp: 30 tablet, Rfl: 2 .  Semaglutide, 1 MG/DOSE, (OZEMPIC, 1 MG/DOSE,) 2 MG/1.5ML SOPN, Inject into the skin. Inject into stomach 1 time weekly, Disp: , Rfl:  .  traZODone (DESYREL) 150 MG tablet, TAKE 1 TABLET BY MOUTH AT BEDTIME, Disp: 90 tablet, Rfl: 1  Past Medical History: Past Medical History:  Diagnosis Date  . Asthma   . Chicken pox   . Depression   . Diabetes mellitus   . Frequent UTI   . GERD (gastroesophageal reflux disease)   . Heart disease   . Heart murmur   . 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 Hx of colonic polyps   . Hyperhidrosis    especially with hot flashes    . Hyperlipidemia   . Lipoma    R axilla   . MRSA cellulitis    surgical wound infection  . Osteoporosis   . Pyelocystitis    as a child   . Thyroid disease   . Urinary incontinence     Tobacco Use: Social History   Tobacco Use  Smoking Status Never Smoker  Smokeless Tobacco Never Used    Labs: Recent Review Scientist, physiological    Labs for ITP Cardiac and Pulmonary Rehab Latest  Ref Rng & Units 04/14/2016 05/11/2017 01/17/2018 06/29/2018 11/27/2018   Cholestrol 0 - 200 mg/dL 239(H) - - - -   LDLCALC 0 - 99 mg/dL 149(H) - - - -   LDLDIRECT mg/dL 165.0 - - - -   HDL >39.00 mg/dL 57.90 - - - -   Trlycerides 0.0 - 149.0 mg/dL 162.0(H) - - - -   Hemoglobin A1c 4.6 - 6.5 % - 7.0(H) 8.4(H) 9.5 10.4(H)       Exercise Target Goals: Exercise Program Goal: Individual exercise prescription set using results from initial 6 min walk test and THRR while considering  patient's activity barriers and safety.   Exercise Prescription Goal: Initial exercise prescription builds to 30-45 minutes a day of aerobic activity, 2-3 days per week.  Home exercise guidelines will be given to patient during program as part of exercise prescription that the participant will acknowledge.  Activity Barriers & Risk Stratification: Activity Barriers &  Cardiac Risk Stratification - 02/07/19 1218      Activity Barriers & Cardiac Risk Stratification   Activity Barriers  Shortness of Breath   Has Addisons disease and SOB has since diagnosed   Cardiac Risk Stratification  High       6 Minute Walk: 6 Minute Walk    Row Name 02/11/19 1229         6 Minute Walk   Phase  Initial     Distance  1003 feet     Walk Time  6 minutes     # of Rest Breaks  0     MPH  1.89     METS  2.05     RPE  11     VO2 Peak  7.18     Resting HR  72 bpm     Resting BP  102/60     Max Ex. HR  85 bpm     Max Ex. BP  122/74     2 Minute Post BP  102/64        Oxygen Initial Assessment:   Oxygen Re-Evaluation:   Oxygen Discharge (Final Oxygen Re-Evaluation):   Initial Exercise Prescription: Initial Exercise Prescription - 02/11/19 1200      Date of Initial Exercise RX and Referring Provider   Date  02/11/19    Referring Provider  JD      NuStep   Level  2    SPM  60    Minutes  15    METs  2      Recumbant Elliptical   Level  1    RPM  30    Watts  20    Minutes  15    METs  2      REL-XR   Level  1    Watts  20    Speed  50    Minutes  15    METs  2      T5 Nustep   Level  1    SPM  50    Minutes  15    METs  2      Prescription Details   Frequency (times per week)  3    Duration  Progress to 30 minutes of continuous aerobic without signs/symptoms of physical distress      Intensity   THRR 40-80% of Max Heartrate  103-134    Ratings of Perceived Exertion  11-15    Perceived Dyspnea  0-4      Progression  Progression  Continue progressive overload as per policy without signs/symptoms or physical distress.      Resistance Training   Training Prescription  Yes    Weight  3    Reps  10-15       Perform Capillary Blood Glucose checks as needed.  Exercise Prescription Changes: Exercise Prescription Changes    Row Name 02/11/19 1200             Response to Exercise   Blood Pressure (Admit)  102/60        Blood Pressure (Exercise)  122/74       Blood Pressure (Exit)  102/64       Heart Rate (Admit)  72 bpm       Heart Rate (Exercise)  85 bpm       Heart Rate (Exit)  63 bpm       Rating of Perceived Exertion (Exercise)  11       Symptoms  no       Duration  Progress to 30 minutes of  aerobic without signs/symptoms of physical distress       Intensity  THRR unchanged          Exercise Comments: Exercise Comments    Row Name 02/13/19 1034           Exercise Comments  First full day of exercise!  Patient was oriented to gym and equipment including functions, settings, policies, and procedures.  Patient's individual exercise prescription and treatment plan were reviewed.  All starting workloads were established based on the results of the 6 minute walk test done at initial orientation visit.  The plan for exercise progression was also introduced and progression will be customized based on patient's performance and goals.          Exercise Goals and Review: Exercise Goals    Row Name 02/11/19 1241             Exercise Goals   Increase Physical Activity  Yes       Intervention  Provide advice, education, support and counseling about physical activity/exercise needs.;Develop an individualized exercise prescription for aerobic and resistive training based on initial evaluation findings, risk stratification, comorbidities and participant's personal goals.       Expected Outcomes  Short Term: Attend rehab on a regular basis to increase amount of physical activity.;Long Term: Add in home exercise to make exercise part of routine and to increase amount of physical activity.;Long Term: Exercising regularly at least 3-5 days a week.       Increase Strength and Stamina  Yes       Intervention  Provide advice, education, support and counseling about physical activity/exercise needs.;Develop an individualized exercise prescription for aerobic and resistive training based on initial evaluation  findings, risk stratification, comorbidities and participant's personal goals.       Expected Outcomes  Short Term: Increase workloads from initial exercise prescription for resistance, speed, and METs.;Short Term: Perform resistance training exercises routinely during rehab and add in resistance training at home;Long Term: Improve cardiorespiratory fitness, muscular endurance and strength as measured by increased METs and functional capacity (6MWT)       Able to understand and use rate of perceived exertion (RPE) scale  Yes       Intervention  Provide education and explanation on how to use RPE scale       Expected Outcomes  Short Term: Able to use RPE daily in rehab to express subjective intensity level;Long Term:  Able to use RPE to guide intensity level when exercising independently       Able to understand and use Dyspnea scale  Yes       Intervention  Provide education and explanation on how to use Dyspnea scale       Expected Outcomes  Short Term: Able to use Dyspnea scale daily in rehab to express subjective sense of shortness of breath during exertion;Long Term: Able to use Dyspnea scale to guide intensity level when exercising independently       Knowledge and understanding of Target Heart Rate Range (THRR)  Yes       Intervention  Provide education and explanation of THRR including how the numbers were predicted and where they are located for reference       Expected Outcomes  Short Term: Able to state/look up THRR;Short Term: Able to use daily as guideline for intensity in rehab;Long Term: Able to use THRR to govern intensity when exercising independently       Able to check pulse independently  Yes       Intervention  Provide education and demonstration on how to check pulse in carotid and radial arteries.;Review the importance of being able to check your own pulse for safety during independent exercise       Expected Outcomes  Short Term: Able to explain why pulse checking is important  during independent exercise       Understanding of Exercise Prescription  Yes       Intervention  Provide education, explanation, and written materials on patient's individual exercise prescription       Expected Outcomes  Short Term: Able to explain program exercise prescription;Long Term: Able to explain home exercise prescription to exercise independently          Exercise Goals Re-Evaluation : Exercise Goals Re-Evaluation    [place] Name 02/13/19 1035             Exercise Goal Re-Evaluation   Exercise Goals Review  Increase Physical Activity;Increase Strength and Stamina;Able to understand and use rate of perceived exertion (RPE) scale;Knowledge and understanding of Target Heart Rate Range (THRR);Understanding of Exercise Prescription       Comments  Reviewed RPE scale, THR and program prescription with pt today.  Pt voiced understanding and was given a copy of goals to take home.       Expected Outcomes  Short: Use RPE daily to regulate intensity. Long: Follow program prescription in THR.          Discharge Exercise Prescription (Final Exercise Prescription Changes): Exercise Prescription Changes - 02/11/19 1200      Response to Exercise   Blood Pressure (Admit)  102/60    Blood Pressure (Exercise)  122/74    Blood Pressure (Exit)  102/64    Heart Rate (Admit)  72 bpm    Heart Rate (Exercise)  85 bpm    Heart Rate (Exit)  63 bpm    Rating of Perceived Exertion (Exercise)  11    Symptoms  no    Duration  Progress to 30 minutes of  aerobic without signs/symptoms of physical distress    Intensity  THRR unchanged       Nutrition:  Target Goals: Understanding of nutrition guidelines, daily intake of sodium <157m, cholesterol <2015m calories 30% from fat and 7% or less from saturated fats, daily to have 5 or more servings of fruits and vegetables.  Biometrics: Pre Biometrics - 02/11/19 1230      Pre  Biometrics   Height  '5\' 2"'  (1.575 m)    Weight  140 lb 6.4 oz (63.7 kg)     BMI (Calculated)  25.67    Single Leg Stand  20.02 seconds        Nutrition Therapy Plan and Nutrition Goals: Nutrition Therapy & Goals - 02/11/19 1155      Nutrition Therapy   Diet  HH, DM diet    Protein (specify units)  50g    Fiber  25 grams    Whole Grain Foods  3 servings    Saturated Fats  12 max. grams    Fruits and Vegetables  5 servings/day    Sodium  1.5 grams      Personal Nutrition Goals   Nutrition Goal  ST: try to add soluble fiber and reduce fatty food intake to manage diarrhea caused by medications LT: gain strength    Comments  Pt with Addisons disease, pt reports trying to eat enough sodium. Discussed HH and DM eating and diarrhea managemnet as she reports her medication can trigger diarrhea. Pt reports eating bagel with cream cheese (whole wheat), coffee with 2% milk and sweetener. tomato sandwich on whole wheat for lunch, and meat with 2 vegetables for dinner. (pork chops or fish (fried), chicken thighs without skin roasted)(corn, beans, peas, potatoes) pt reports also having fruit like melons (all different kinds). Pt does not tolerate bananas well.      Intervention Plan   Intervention  Prescribe, educate and counsel regarding individualized specific dietary modifications aiming towards targeted core components such as weight, hypertension, lipid management, diabetes, heart failure and other comorbidities.;Nutrition handout(s) given to patient.    Expected Outcomes  Short Term Goal: Understand basic principles of dietary content, such as calories, fat, sodium, cholesterol and nutrients.;Short Term Goal: A plan has been developed with personal nutrition goals set during dietitian appointment.;Long Term Goal: Adherence to prescribed nutrition plan.       Nutrition Assessments:   Nutrition Goals Re-Evaluation:   Nutrition Goals Discharge (Final Nutrition Goals Re-Evaluation):   Psychosocial: Target Goals: Acknowledge presence or absence of significant  depression and/or stress, maximize coping skills, provide positive support system. Participant is able to verbalize types and ability to use techniques and skills needed for reducing stress and depression.   Initial Review & Psychosocial Screening: Initial Psych Review & Screening - 02/07/19 1220      Initial Review   Current issues with  Current Stress Concerns    Source of Stress Concerns  Retirement/disability;Family    Comments  Always worked and had to quit work. Not having anything to do.  COVID 19 restricting ability to socialize.   Children making sure they are carede for if something happens to her.      Family Dynamics   Good Support System?  Yes   Husband, Family, neighbors/friends and church     Barriers   Psychosocial barriers to participate in program  There are no identifiable barriers or psychosocial needs.;The patient should benefit from training in stress management and relaxation.      Screening Interventions   Interventions  Encouraged to exercise;Provide feedback about the scores to participant;To provide support and resources with identified psychosocial needs    Expected Outcomes  Short Term goal: Utilizing psychosocial counselor, staff and physician to assist with identification of specific Stressors or current issues interfering with healing process. Setting desired goal for each stressor or current issue identified.;Long Term Goal: Stressors or current issues are controlled or eliminated.;Short  Term goal: Identification and review with participant of any Quality of Life or Depression concerns found by scoring the questionnaire.;Long Term goal: The participant improves quality of Life and PHQ9 Scores as seen by post scores and/or verbalization of changes       Quality of Life Scores:   Scores of 19 and below usually indicate a poorer quality of life in these areas.  A difference of  2-3 points is a clinically meaningful difference.  A difference of 2-3 points in the  total score of the Quality of Life Index has been associated with significant improvement in overall quality of life, self-image, physical symptoms, and general health in studies assessing change in quality of life.  PHQ-9: Recent Review Flowsheet Data    Depression screen [facility] 2/9 12/10/2018 07/12/2018 07/10/2017 07/08/2016 05/29/2015   Decreased Interest 0 1 0 0 2   Down, Depressed, Hopeless 1 0 0 0 2   PHQ - 2 Score 1 1 0 0 4   Altered sleeping - - - - 3   Tired, decreased energy - - - - 3   Change in appetite - - - - 1   Feeling bad or failure about yourself  - - - - 1   Trouble concentrating - - - - 1   Moving slowly or fidgety/restless - - - - 1   Suicidal thoughts - - - - 0   PHQ-9 Score - - - - 14   Difficult doing work/chores - - - - Very difficult     Interpretation of Total Score  Total Score Depression Severity:  1-4 = Minimal depression, 5-9 = Mild depression, 10-14 = Moderate depression, 15-19 = Moderately severe depression, 20-27 = Severe depression   Psychosocial Evaluation and Intervention:   Psychosocial Re-Evaluation:   Psychosocial Discharge (Final Psychosocial Re-Evaluation):   Vocational Rehabilitation: Provide vocational rehab assistance to qualifying candidates.   Vocational Rehab Evaluation & Intervention: Vocational Rehab - 02/07/19 1224      Initial Vocational Rehab Evaluation & Intervention   Assessment shows need for Vocational Rehabilitation  No       Education: Education Goals: Education classes will be provided on a variety of topics geared toward better understanding of heart health and risk factor modification. Participant will state understanding/return demonstration of topics presented as noted by education test scores.  Learning Barriers/Preferences: Learning Barriers/Preferences - 02/07/19 1224      Learning Barriers/Preferences   Learning Barriers  None    Learning Preferences  None       Education Topics:  AED/CPR: - Group  verbal and written instruction with the use of models to demonstrate the basic use of the AED with the basic ABC's of resuscitation.   General Nutrition Guidelines/Fats and Fiber: -Group instruction provided by verbal, written material, models and posters to present the general guidelines for heart healthy nutrition. Gives an explanation and review of dietary fats and fiber.   Controlling Sodium/Reading Food Labels: -Group verbal and written material supporting the discussion of sodium use in heart healthy nutrition. Review and explanation with models, verbal and written materials for utilization of the food label.   Exercise Physiology & General Exercise Guidelines: - Group verbal and written instruction with models to review the exercise physiology of the cardiovascular system and associated critical values. Provides general exercise guidelines with specific guidelines to those with heart or lung disease.    Aerobic Exercise & Resistance Training: - Gives group verbal and written instruction on the various components of  exercise. Focuses on aerobic and resistive training programs and the benefits of this training and how to safely progress through these programs..   Flexibility, Balance, Mind/Body Relaxation: Provides group verbal/written instruction on the benefits of flexibility and balance training, including mind/body exercise modes such as yoga, pilates and tai chi.  Demonstration and skill practice provided.   Stress and Anxiety: - Provides group verbal and written instruction about the health risks of elevated stress and causes of high stress.  Discuss the correlation between heart/lung disease and anxiety and treatment options. Review healthy ways to manage with stress and anxiety.   Depression: - Provides group verbal and written instruction on the correlation between heart/lung disease and depressed mood, treatment options, and the stigmas associated with seeking  treatment.   Anatomy & Physiology of the Heart: - Group verbal and written instruction and models provide basic cardiac anatomy and physiology, with the coronary electrical and arterial systems. Review of Valvular disease and Heart Failure   Cardiac Procedures: - Group verbal and written instruction to review commonly prescribed medications for heart disease. Reviews the medication, class of the drug, and side effects. Includes the steps to properly store meds and maintain the prescription regimen. (beta blockers and nitrates)   Cardiac Medications I: - Group verbal and written instruction to review commonly prescribed medications for heart disease. Reviews the medication, class of the drug, and side effects. Includes the steps to properly store meds and maintain the prescription regimen.   Cardiac Medications II: -Group verbal and written instruction to review commonly prescribed medications for heart disease. Reviews the medication, class of the drug, and side effects. (all other drug classes)    Go Sex-Intimacy & Heart Disease, Get SMART - Goal Setting: - Group verbal and written instruction through game format to discuss heart disease and the return to sexual intimacy. Provides group verbal and written material to discuss and apply goal setting through the application of the S.M.A.R.T. Method.   Other Matters of the Heart: - Provides group verbal, written materials and models to describe Stable Angina and Peripheral Artery. Includes description of the disease process and treatment options available to the cardiac patient.   Exercise & Equipment Safety: - Individual verbal instruction and demonstration of equipment use and safety with use of the equipment.   Cardiac Rehab from 02/11/2019 in [facility] Cardiac and Pulmonary Rehab  Date  02/11/19  Educator  [place]  Instruction Review Code  1- Verbalizes Understanding      Infection Prevention: - Provides verbal and written material to  individual with discussion of infection control including proper hand washing and proper equipment cleaning during exercise session.   Cardiac Rehab from 02/11/2019 in [facility] Cardiac and Pulmonary Rehab  Date  02/11/19  Educator  [place]  Instruction Review Code  1- Verbalizes Understanding      Falls Prevention: - Provides verbal and written material to individual with discussion of falls prevention and safety.   Cardiac Rehab from 02/11/2019 in [facility] Cardiac and Pulmonary Rehab  Date  02/11/19  Educator  [place]  Instruction Review Code  1- Verbalizes Understanding      Diabetes: - Individual verbal and written instruction to review signs/symptoms of diabetes, desired ranges of glucose level fasting, after meals and with exercise. Acknowledge that pre and post exercise glucose checks will be done for 3 sessions at entry of program.   Cardiac Rehab from 02/11/2019 in [facility] Cardiac and Pulmonary Rehab  Date  02/11/19  Educator  [place]  Instruction Review  Code  1- Verbalizes Understanding      Know Your Numbers and Risk Factors: -Group verbal and written instruction about important numbers in your health.  Discussion of what are risk factors and how they play a role in the disease process.  Review of Cholesterol, Blood Pressure, Diabetes, and BMI and the role they play in your overall health.   Sleep Hygiene: -Provides group verbal and written instruction about how sleep can affect your health.  Define sleep hygiene, discuss sleep cycles and impact of sleep habits. Review good sleep hygiene tips.    Other: -Provides group and verbal instruction on various topics (see comments)   Knowledge Questionnaire Score: Knowledge Questionnaire Score - 02/11/19 0909      Knowledge Questionnaire Score   Pre Score  18/26  Missed Nutrition, Angina and Exercise questions       Core Components/Risk Factors/Patient Goals at Admission: Personal Goals and Risk Factors at Admission - 02/07/19 1225      Core  Components/Risk Factors/Patient Goals on Admission    Weight Management  Yes;Weight Maintenance    Intervention  Weight Management: Develop a combined nutrition and exercise program designed to reach desired caloric intake, while maintaining appropriate intake of nutrient and fiber, sodium and fats, and appropriate energy expenditure required for the weight goal.;Weight Management: Provide education and appropriate resources to help participant work on and attain dietary goals.    Expected Outcomes  Short Term: Continue to assess and modify interventions until short term weight is achieved;Long Term: Adherence to nutrition and physical activity/exercise program aimed toward attainment of established weight goal    Diabetes  Yes    Intervention  Provide education about signs/symptoms and action to take for hypo/hyperglycemia.;Provide education about proper nutrition, including hydration, and aerobic/resistive exercise prescription along with prescribed medications to achieve blood glucose in normal ranges: Fasting glucose 65-99 mg/dL    Expected Outcomes  Short Term: Participant verbalizes understanding of the signs/symptoms and immediate care of hyper/hypoglycemia, proper foot care and importance of medication, aerobic/resistive exercise and nutrition plan for blood glucose control.;Long Term: Attainment of HbA1C < 7%.    Hypertension  Yes    Intervention  Provide education on lifestyle modifcations including regular physical activity/exercise, weight management, moderate sodium restriction and increased consumption of fresh fruit, vegetables, and low fat dairy, alcohol moderation, and smoking cessation.;Monitor prescription use compliance.    Expected Outcomes  Short Term: Continued assessment and intervention until BP is < 140/30m HG in hypertensive participants. < 130/866mHG in hypertensive participants with diabetes, heart failure or chronic kidney disease.;Long Term: Maintenance of blood pressure at  goal levels.    Lipids  Yes    Intervention  Provide education and support for participant on nutrition & aerobic/resistive exercise along with prescribed medications to achieve LDL <7010mHDL >4m69m  Expected Outcomes  Short Term: Participant states understanding of desired cholesterol values and is compliant with medications prescribed. Participant is following exercise prescription and nutrition guidelines.;Long Term: Cholesterol controlled with medications as prescribed, with individualized exercise RX and with personalized nutrition plan. Value goals: LDL < 70mg72mL > 40 mg.       Core Components/Risk Factors/Patient Goals Review:    Core Components/Risk Factors/Patient Goals at Discharge (Final Review):    ITP Comments: ITP Comments    Row Name 02/07/19 1217 02/13/19 1035 02/20/19 0619       ITP Comments  Orientation completed virtually today   Appointment on Mon 8/3  ro EP and RD evals  and gym orientation Documentation of diagnosis [facility] found in [facility]/13/[facility]  First full day of exercise!  Patient was oriented to gym and equipment including functions, settings, policies, and procedures.  Patient's individual exercise prescription and treatment plan were reviewed.  All starting workloads were established based on the results of the 6 minute walk test done at initial orientation visit.  The plan for exercise progression was also introduced and progression will be customized based on patient's performance and goals.  30 Day Review Completed today. Continue with ITP unless changed by Medical Director review.  New to program        Comments:

## 2019-02-22 ENCOUNTER — Encounter: Payer: PPO | Admitting: *Deleted

## 2019-02-22 ENCOUNTER — Other Ambulatory Visit: Payer: Self-pay

## 2019-02-22 DIAGNOSIS — I214 Non-ST elevation (NSTEMI) myocardial infarction: Secondary | ICD-10-CM | POA: Diagnosis not present

## 2019-02-22 NOTE — Progress Notes (Signed)
Daily Session Note  Patient Details  Name: Haley Young MRN: [id number] Date of Birth: [birth year] Referring Provider:     Cardiac Rehab from 02/11/2019 in [facility] Cardiac and Pulmonary Rehab  Referring Provider  JD      Encounter Date: 02/22/2019  Check In: Session Check In - 02/22/19 1038      Check-In   Supervising physician immediately available to respond to emergencies  See telemetry face sheet for immediately available ER MD    Location  [facility]-Cardiac & Pulmonary Rehab    Staff Present  Renita Papa, RN Vickki Hearing, BA, ACSM CEP, Exercise Physiologist;Joseph Tessie Fass RCP,RRT,BSRT    Virtual Visit  No    Medication changes reported      No    Fall or balance concerns reported     No    Warm-up and Cool-down  Performed on first and last piece of equipment    Resistance Training Performed  Yes    VAD Patient?  No    PAD/SET Patient?  No      Pain Assessment   Currently in Pain?  No/denies          Social History   Tobacco Use  Smoking Status Never Smoker  Smokeless Tobacco Never Used    Goals Met:  Independence with exercise equipment Exercise tolerated well No report of cardiac concerns or symptoms Strength training completed today  Goals Unmet:  Not Applicable  Comments: Pt able to follow exercise prescription today without complaint.  Will continue to monitor for progression.    Dr. Emily Filbert is Medical Director for [place] and [facility].

## 2019-02-25 ENCOUNTER — Other Ambulatory Visit: Payer: Self-pay

## 2019-02-25 ENCOUNTER — Encounter: Payer: PPO | Admitting: *Deleted

## 2019-02-25 DIAGNOSIS — I214 Non-ST elevation (NSTEMI) myocardial infarction: Secondary | ICD-10-CM

## 2019-02-25 NOTE — Progress Notes (Signed)
Daily Session Note  Patient Details  Name: Haley Young MRN: [id number] Date of Birth: 12/04/1950 Referring Provider:     Cardiac Rehab from 02/11/2019 in [facility] Cardiac and Pulmonary Rehab  Referring Provider  JD      Encounter Date: 02/25/2019  Check In: Session Check In - 02/25/19 1044      Check-In   Supervising physician immediately available to respond to emergencies  See telemetry face sheet for immediately available ER MD    Location  [facility]-Cardiac & Pulmonary Rehab    Staff Present  Nada Maclachlan, BA, ACSM CEP, Exercise Physiologist;Kiearra Oyervides Amedeo Plenty, BS, ACSM CEP, Exercise Physiologist;Susanne Bice, RN, BSN, CCRP    Virtual Visit  No    Medication changes reported      No    Fall or balance concerns reported     No    Warm-up and Cool-down  Performed on first and last piece of equipment    Resistance Training Performed  Yes    VAD Patient?  No    PAD/SET Patient?  No      Pain Assessment   Currently in Pain?  No/denies    Multiple Pain Sites  No          Social History   Tobacco Use  Smoking Status Never Smoker  Smokeless Tobacco Never Used    Goals Met:    Goals Unmet:  Not Applicable  Comments: Sent home today incomplete session  Dr. Emily Filbert is Medical Director for [place] and [facility].

## 2019-02-25 NOTE — Progress Notes (Signed)
Incomplete Session Note  Patient Details  Name: Haley Young MRN: [id number] Date of Birth: [birth year] Referring Provider:     Cardiac Rehab from 02/11/2019 in [facility] Cardiac and Pulmonary Rehab  Referring Provider  JD      Haley Young did not complete her rehab session.  [name] described episode of HR dropping to 43 yesterday. She had symptoms of feeling washed out and not feeling well.  HR went back to 110 within 5 minutes.   She was advised to call her cardiologist and get cleared to return to program after she sees him.

## 2019-02-26 DIAGNOSIS — Q221 Congenital pulmonary valve stenosis: Secondary | ICD-10-CM | POA: Diagnosis not present

## 2019-02-26 DIAGNOSIS — I471 Supraventricular tachycardia: Secondary | ICD-10-CM | POA: Diagnosis not present

## 2019-02-26 DIAGNOSIS — I5020 Unspecified systolic (congestive) heart failure: Secondary | ICD-10-CM | POA: Diagnosis not present

## 2019-02-26 DIAGNOSIS — I480 Paroxysmal atrial fibrillation: Secondary | ICD-10-CM | POA: Diagnosis not present

## 2019-02-26 DIAGNOSIS — I442 Atrioventricular block, complete: Secondary | ICD-10-CM | POA: Diagnosis not present

## 2019-03-06 ENCOUNTER — Encounter: Payer: Self-pay | Admitting: *Deleted

## 2019-03-06 ENCOUNTER — Telehealth: Payer: Self-pay | Admitting: *Deleted

## 2019-03-06 DIAGNOSIS — I214 Non-ST elevation (NSTEMI) myocardial infarction: Secondary | ICD-10-CM

## 2019-03-06 NOTE — Telephone Encounter (Signed)
Called to check on pt.  She was cleared by cardiology to return to rehab on 8/18 but she has not been back since then.  Left message.

## 2019-03-08 ENCOUNTER — Telehealth: Payer: Self-pay

## 2019-03-08 ENCOUNTER — Telehealth: Payer: Self-pay | Admitting: *Deleted

## 2019-03-08 NOTE — Telephone Encounter (Signed)
Haley Young called to state although she has had clearance to come back to Cardiac Rehab, she has been out caring for her husband who fell last week and was injured. She plans to come back on Monday 8/31

## 2019-03-08 NOTE — Telephone Encounter (Signed)
Spoke with Haley Young in regards to her daughter and told her I would reach out to the group to see about getting her scheduled for today with one of our providers.

## 2019-03-11 ENCOUNTER — Other Ambulatory Visit: Payer: Self-pay

## 2019-03-11 ENCOUNTER — Encounter: Payer: PPO | Admitting: *Deleted

## 2019-03-11 DIAGNOSIS — I214 Non-ST elevation (NSTEMI) myocardial infarction: Secondary | ICD-10-CM | POA: Diagnosis not present

## 2019-03-11 NOTE — Progress Notes (Signed)
Daily Session Note  Patient Details  Name: ANGELEEN HORNEY MRN: [id number] Date of Birth: 1951-01-19 Referring Provider:     Cardiac Rehab from 02/11/2019 in [facility] Cardiac and Pulmonary Rehab  Referring Provider  JD      Encounter Date: 03/11/2019  Check In: Session Check In - 03/11/19 1038      Check-In   Supervising physician immediately available to respond to emergencies  See telemetry face sheet for immediately available ER MD    Location  [facility]-Cardiac & Pulmonary Rehab    Staff Present  Renita Papa, RN BSN;Jeanna Durrell BS, Exercise Physiologist;Kelly Amedeo Plenty, BS, ACSM CEP, Exercise Physiologist    Virtual Visit  No    Medication changes reported      No    Fall or balance concerns reported     No    Warm-up and Cool-down  Performed on first and last piece of equipment    Resistance Training Performed  Yes    VAD Patient?  No    PAD/SET Patient?  No      Pain Assessment   Currently in Pain?  No/denies          Social History   Tobacco Use  Smoking Status Never Smoker  Smokeless Tobacco Never Used    Goals Met:  Independence with exercise equipment Exercise tolerated well No report of cardiac concerns or symptoms Strength training completed today  Goals Unmet:  Not Applicable  Comments: Reviewed home exercise with pt today.  Pt plans to walk and use videos for exercise.  Reviewed THR, pulse, RPE, sign and symptoms, NTG use, and when to call 911 or MD.  Also discussed weather considerations and indoor options.  Pt voiced understanding.     Dr. Emily Filbert is Medical Director for [place] and [facility].

## 2019-03-13 ENCOUNTER — Encounter: Payer: PPO | Attending: Internal Medicine | Admitting: *Deleted

## 2019-03-13 ENCOUNTER — Other Ambulatory Visit: Payer: Self-pay

## 2019-03-13 DIAGNOSIS — Z794 Long term (current) use of insulin: Secondary | ICD-10-CM | POA: Insufficient documentation

## 2019-03-13 DIAGNOSIS — E079 Disorder of thyroid, unspecified: Secondary | ICD-10-CM | POA: Insufficient documentation

## 2019-03-13 DIAGNOSIS — Z79899 Other long term (current) drug therapy: Secondary | ICD-10-CM | POA: Insufficient documentation

## 2019-03-13 DIAGNOSIS — E785 Hyperlipidemia, unspecified: Secondary | ICD-10-CM | POA: Diagnosis not present

## 2019-03-13 DIAGNOSIS — Z7901 Long term (current) use of anticoagulants: Secondary | ICD-10-CM | POA: Insufficient documentation

## 2019-03-13 DIAGNOSIS — F329 Major depressive disorder, single episode, unspecified: Secondary | ICD-10-CM | POA: Insufficient documentation

## 2019-03-13 DIAGNOSIS — Z79890 Hormone replacement therapy (postmenopausal): Secondary | ICD-10-CM | POA: Insufficient documentation

## 2019-03-13 DIAGNOSIS — Z86010 Personal history of colonic polyps: Secondary | ICD-10-CM | POA: Insufficient documentation

## 2019-03-13 DIAGNOSIS — I214 Non-ST elevation (NSTEMI) myocardial infarction: Secondary | ICD-10-CM | POA: Insufficient documentation

## 2019-03-13 DIAGNOSIS — E119 Type 2 diabetes mellitus without complications: Secondary | ICD-10-CM | POA: Insufficient documentation

## 2019-03-13 DIAGNOSIS — Z7982 Long term (current) use of aspirin: Secondary | ICD-10-CM | POA: Diagnosis not present

## 2019-03-13 DIAGNOSIS — J45909 Unspecified asthma, uncomplicated: Secondary | ICD-10-CM | POA: Insufficient documentation

## 2019-03-13 NOTE — Progress Notes (Signed)
hDaily Session Note  Patient Details  Name: SHAWNISE PETERKIN MRN: [id number] Date of Birth: [birth year] Referring Provider:     Cardiac Rehab from 02/11/2019 in [facility] Cardiac and Pulmonary Rehab  Referring Provider  JD      Encounter Date: 03/13/2019  Check In: Session Check In - 03/13/19 1059      Check-In   Supervising physician immediately available to respond to emergencies  See telemetry face sheet for immediately available ER MD    Location  [facility]-Cardiac & Pulmonary Rehab    Staff Present  Alberteen Sam, MA, RCEP, CCRP, CCET;Joseph Christain [place], RN BSN    Virtual Visit  No    Medication changes reported      No    Fall or balance concerns reported     No    Warm-up and Cool-down  Performed on first and last piece of equipment    Resistance Training Performed  Yes    VAD Patient?  No    PAD/SET Patient?  No      Pain Assessment   Currently in Pain?  No/denies          Social History   Tobacco Use  Smoking Status Never Smoker  Smokeless Tobacco Never Used    Goals Met:  Independence with exercise equipment Exercise tolerated well No report of cardiac concerns or symptoms Strength training completed today  Goals Unmet:  Not Applicable  Comments: Pt able to follow exercise prescription today without complaint.  Will continue to monitor for progression.    Dr. Emily Filbert is Medical Director for [place] and [facility].

## 2019-03-13 NOTE — Progress Notes (Signed)
Counselor contacted patient upon staff recommendation.  Patient processed nature of event that led to program entry.  Counselor provided education to normalize experience. Discussion of available resources.  Patient expressed importance of faith.  Patient shared family of origin issues and thoughts regarding how they were at play in her life with her children.   Issues with immune system, feels it contributed to event.  Patient reported shortness of breath this past Sunday, church response and conversation with cardiologist.

## 2019-03-15 ENCOUNTER — Other Ambulatory Visit: Payer: Self-pay

## 2019-03-15 DIAGNOSIS — I214 Non-ST elevation (NSTEMI) myocardial infarction: Secondary | ICD-10-CM | POA: Diagnosis not present

## 2019-03-15 NOTE — Progress Notes (Signed)
Daily Session Note  Patient Details  Name: Haley Young MRN: [id number] Date of Birth: 09/07/1950 Referring Provider:     Cardiac Rehab from 02/11/2019 in [facility] Cardiac and Pulmonary Rehab  Referring Provider  JD      Encounter Date: 03/15/2019  Check In: Session Check In - 03/15/19 1048      Check-In   Supervising physician immediately available to respond to emergencies  See telemetry face sheet for immediately available ER MD    Location  [facility]-Cardiac & Pulmonary Rehab    Staff Present  Leslie Castrejon RN, BSN;Jessica Hawkins, MA, RCEP, CCRP, CCET;Joseph Hood RCP,RRT,BSRT    Virtual Visit  No    Medication changes reported      No    Fall or balance concerns reported     No    Warm-up and Cool-down  Performed on first and last piece of equipment    Resistance Training Performed  Yes    VAD Patient?  No    PAD/SET Patient?  No      Pain Assessment   Currently in Pain?  No/denies    Multiple Pain Sites  No          Social History   Tobacco Use  Smoking Status Never Smoker  Smokeless Tobacco Never Used    Goals Met:  Independence with exercise equipment Exercise tolerated well No report of cardiac concerns or symptoms Strength training completed today  Goals Unmet:  Not Applicable  Comments: Pt able to follow exercise prescription today without complaint.  Will continue to monitor for progression.    Dr. Mark Miller is Medical Director for [facility] and [facility]. 

## 2019-03-20 ENCOUNTER — Other Ambulatory Visit: Payer: Self-pay

## 2019-03-20 ENCOUNTER — Encounter: Payer: PPO | Admitting: *Deleted

## 2019-03-20 ENCOUNTER — Encounter: Payer: Self-pay | Admitting: *Deleted

## 2019-03-20 DIAGNOSIS — I214 Non-ST elevation (NSTEMI) myocardial infarction: Secondary | ICD-10-CM

## 2019-03-20 DIAGNOSIS — I4430 Unspecified atrioventricular block: Secondary | ICD-10-CM | POA: Diagnosis not present

## 2019-03-20 DIAGNOSIS — Z950 Presence of cardiac pacemaker: Secondary | ICD-10-CM | POA: Diagnosis not present

## 2019-03-20 DIAGNOSIS — Z45018 Encounter for adjustment and management of other part of cardiac pacemaker: Secondary | ICD-10-CM | POA: Diagnosis not present

## 2019-03-20 NOTE — Progress Notes (Signed)
Cardiac Individual Treatment Plan  Patient Details  Name: Haley Young MRN: [id number] Date of Birth: Dec 29, 1950 Referring Provider:     Cardiac Rehab from 02/11/2019 in [facility] Cardiac and Pulmonary Rehab  Referring Provider  JD      Initial Encounter Date:    Cardiac Rehab from 02/11/2019 in [facility] Cardiac and Pulmonary Rehab  Date  02/11/19      Visit Diagnosis: NSTEMI (non-ST elevation myocardial infarction) [facility])  Patient's Home Medications on Admission:  Current Outpatient Medications:  .  apixaban (ELIQUIS) 5 MG TABS tablet, Take 1 tablet (5 mg total) by mouth 2 (two) times daily., Disp: 60 tablet, Rfl: 11 .  aspirin 81 MG chewable tablet, Chew 81 mg by mouth daily., Disp: , Rfl:  .  atorvastatin (LIPITOR) 40 MG tablet, Take 40 mg by mouth daily., Disp: , Rfl:  .  Fludrocortisone Acetate (FLORINEF PO), Take 0.1 mg by mouth daily., Disp: , Rfl:  .  FLUoxetine (PROZAC) 10 MG tablet, Take 1 tablet by mouth once daily, Disp: 90 tablet, Rfl: 1 .  furosemide (LASIX) 20 MG tablet, Take 20 mg by mouth daily. , Disp: , Rfl:  .  glipiZIDE (GLUCOTROL) 10 MG tablet, Take 1 tablet (10 mg total) by mouth 2 (two) times daily before a meal., Disp: 180 tablet, Rfl: 3 .  glucose blood (ONE TOUCH ULTRA TEST) test strip, USE ONE STRIP TO CHECK GLUCOSE THREE TIMES DAILY AS DIRECTED E11.65, Disp: 200 each, Rfl: 2 .  hydrocortisone (CORTEF) 10 MG tablet, Take 5 mg by mouth 2 (two) times daily. 10 mg QAM, 5 mg lunch, 5 PM, Disp: , Rfl:  .  hydrocortisone sodium succinate (SOLU-CORTEF) 100 MG SOLR injection, Use 1 injection for adrenal crisis, Disp: , Rfl:  .  levothyroxine (SYNTHROID) 112 MCG tablet, Take 1 tablet (112 mcg total) by mouth daily., Disp: 90 tablet, Rfl: 0 .  metFORMIN (GLUCOPHAGE) 500 MG tablet, Take 1,000 mg by mouth 2 (two) times daily with a meal. , Disp: , Rfl:  .  mirtazapine (REMERON) 7.5 MG tablet, TAKE 1 TABLET BY MOUTH AT BEDTIME, Disp: 90 tablet, Rfl: 1 .  Potassium Chloride ER 20  MEQ TBCR, Take 1 tablet by mouth once daily, Disp: 30 tablet, Rfl: 2 .  Semaglutide, 1 MG/DOSE, (OZEMPIC, 1 MG/DOSE,) 2 MG/1.5ML SOPN, Inject into the skin. Inject into stomach 1 time weekly, Disp: , Rfl:  .  traZODone (DESYREL) 150 MG tablet, TAKE 1 TABLET BY MOUTH AT BEDTIME, Disp: 90 tablet, Rfl: 1  Past Medical History: Past Medical History:  Diagnosis Date  . Asthma   . Chicken pox   . Depression   . Diabetes mellitus   . Frequent UTI   . GERD (gastroesophageal reflux disease)   . Heart disease   . Heart murmur   . 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 Hx of colonic polyps   . Hyperhidrosis    especially with hot flashes    . Hyperlipidemia   . Lipoma    R axilla   . MRSA cellulitis    surgical wound infection  . Osteoporosis   . Pyelocystitis    as a child   . Thyroid disease   . Urinary incontinence     Tobacco Use: Social History   Tobacco Use  Smoking Status Never Smoker  Smokeless Tobacco Never Used    Labs: Recent Review Scientist, physiological    Labs for ITP Cardiac and Pulmonary Rehab Latest  Ref Rng & Units 04/14/2016 05/11/2017 01/17/2018 06/29/2018 11/27/2018   Cholestrol 0 - 200 mg/dL 239(H) - - - -   LDLCALC 0 - 99 mg/dL 149(H) - - - -   LDLDIRECT mg/dL 165.0 - - - -   HDL >39.00 mg/dL 57.90 - - - -   Trlycerides 0.0 - 149.0 mg/dL 162.0(H) - - - -   Hemoglobin A1c 4.6 - 6.5 % - 7.0(H) 8.4(H) 9.5 10.4(H)       Exercise Target Goals: Exercise Program Goal: Individual exercise prescription set using results from initial 6 min walk test and THRR while considering  patient's activity barriers and safety.   Exercise Prescription Goal: Initial exercise prescription builds to 30-45 minutes a day of aerobic activity, 2-3 days per week.  Home exercise guidelines will be given to patient during program as part of exercise prescription that the participant will acknowledge.  Activity Barriers & Risk Stratification: Activity Barriers &  Cardiac Risk Stratification - 02/07/19 1218      Activity Barriers & Cardiac Risk Stratification   Activity Barriers  Shortness of Breath   Has Addisons disease and SOB has since diagnosed   Cardiac Risk Stratification  High       6 Minute Walk: 6 Minute Walk    Row Name 02/11/19 1229         6 Minute Walk   Phase  Initial     Distance  1003 feet     Walk Time  6 minutes     # of Rest Breaks  0     MPH  1.89     METS  2.05     RPE  11     VO2 Peak  7.18     Resting HR  72 bpm     Resting BP  102/60     Max Ex. HR  85 bpm     Max Ex. BP  122/74     2 Minute Post BP  102/64        Oxygen Initial Assessment:   Oxygen Re-Evaluation:   Oxygen Discharge (Final Oxygen Re-Evaluation):   Initial Exercise Prescription: Initial Exercise Prescription - 02/11/19 1200      Date of Initial Exercise RX and Referring Provider   Date  02/11/19    Referring Provider  JD      NuStep   Level  2    SPM  60    Minutes  15    METs  2      Recumbant Elliptical   Level  1    RPM  30    Watts  20    Minutes  15    METs  2      REL-XR   Level  1    Watts  20    Speed  50    Minutes  15    METs  2      T5 Nustep   Level  1    SPM  50    Minutes  15    METs  2      Prescription Details   Frequency (times per week)  3    Duration  Progress to 30 minutes of continuous aerobic without signs/symptoms of physical distress      Intensity   THRR 40-80% of Max Heartrate  103-134    Ratings of Perceived Exertion  11-15    Perceived Dyspnea  0-4      Progression  Progression  Continue progressive overload as per policy without signs/symptoms or physical distress.      Resistance Training   Training Prescription  Yes    Weight  3    Reps  10-15       Perform Capillary Blood Glucose checks as needed.  Exercise Prescription Changes: Exercise Prescription Changes    Row Name 02/11/19 1200 02/28/19 1400           Response to Exercise   Blood Pressure (Admit)   102/60  100/62      Blood Pressure (Exercise)  122/74  142/80      Blood Pressure (Exit)  102/64  102/62      Heart Rate (Admit)  72 bpm  88 bpm      Heart Rate (Exercise)  85 bpm  129 bpm      Heart Rate (Exit)  63 bpm  117 bpm      Rating of Perceived Exertion (Exercise)  11  13      Symptoms  no  none      Duration  Progress to 30 minutes of  aerobic without signs/symptoms of physical distress  Continue with 30 min of aerobic exercise without signs/symptoms of physical distress.      Intensity  THRR unchanged  THRR unchanged        Progression   Progression  -  Continue to progress workloads to maintain intensity without signs/symptoms of physical distress.      Average METs  -  2.07        Resistance Training   Training Prescription  -  Yes      Weight  -  3 lbs      Reps  -  10-15        Interval Training   Interval Training  -  No        NuStep   Level  -  3      Minutes  -  15      METs  -  2.7        Recumbant Elliptical   Level  -  1      Minutes  -  15      METs  -  1.4        REL-XR   Level  -  1      Minutes  -  15      METs  -  2.2        T5 Nustep   Level  -  1      Minutes  -  15      METs  -  2         Exercise Comments: Exercise Comments    Row Name 02/13/19 1034           Exercise Comments  First full day of exercise!  Patient was oriented to gym and equipment including functions, settings, policies, and procedures.  Patient's individual exercise prescription and treatment plan were reviewed.  All starting workloads were established based on the results of the 6 minute walk test done at initial orientation visit.  The plan for exercise progression was also introduced and progression will be customized based on patient's performance and goals.          Exercise Goals and Review: Exercise Goals    Row Name 02/11/19 1241             Exercise Goals   Increase Physical  Activity  Yes       Intervention  Provide advice, education, support  and counseling about physical activity/exercise needs.;Develop an individualized exercise prescription for aerobic and resistive training based on initial evaluation findings, risk stratification, comorbidities and participant's personal goals.       Expected Outcomes  Short Term: Attend rehab on a regular basis to increase amount of physical activity.;Long Term: Add in home exercise to make exercise part of routine and to increase amount of physical activity.;Long Term: Exercising regularly at least 3-5 days a week.       Increase Strength and Stamina  Yes       Intervention  Provide advice, education, support and counseling about physical activity/exercise needs.;Develop an individualized exercise prescription for aerobic and resistive training based on initial evaluation findings, risk stratification, comorbidities and participant's personal goals.       Expected Outcomes  Short Term: Increase workloads from initial exercise prescription for resistance, speed, and METs.;Short Term: Perform resistance training exercises routinely during rehab and add in resistance training at home;Long Term: Improve cardiorespiratory fitness, muscular endurance and strength as measured by increased METs and functional capacity (6MWT)       Able to understand and use rate of perceived exertion (RPE) scale  Yes       Intervention  Provide education and explanation on how to use RPE scale       Expected Outcomes  Short Term: Able to use RPE daily in rehab to express subjective intensity level;Long Term:  Able to use RPE to guide intensity level when exercising independently       Able to understand and use Dyspnea scale  Yes       Intervention  Provide education and explanation on how to use Dyspnea scale       Expected Outcomes  Short Term: Able to use Dyspnea scale daily in rehab to express subjective sense of shortness of breath during exertion;Long Term: Able to use Dyspnea scale to guide intensity level when exercising  independently       Knowledge and understanding of Target Heart Rate Range (THRR)  Yes       Intervention  Provide education and explanation of THRR including how the numbers were predicted and where they are located for reference       Expected Outcomes  Short Term: Able to state/look up THRR;Short Term: Able to use daily as guideline for intensity in rehab;Long Term: Able to use THRR to govern intensity when exercising independently       Able to check pulse independently  Yes       Intervention  Provide education and demonstration on how to check pulse in carotid and radial arteries.;Review the importance of being able to check your own pulse for safety during independent exercise       Expected Outcomes  Short Term: Able to explain why pulse checking is important during independent exercise       Understanding of Exercise Prescription  Yes       Intervention  Provide education, explanation, and written materials on patient's individual exercise prescription       Expected Outcomes  Short Term: Able to explain program exercise prescription;Long Term: Able to explain home exercise prescription to exercise independently          Exercise Goals Re-Evaluation : Exercise Goals Re-Evaluation    Row Name 02/13/19 1035 02/28/19 1404 03/05/19 1640 03/11/19 1101 03/13/19 1115     Exercise Goal Re-Evaluation   Exercise  Goals Review  Increase Physical Activity;Increase Strength and Stamina;Able to understand and use rate of perceived exertion (RPE) scale;Knowledge and understanding of Target Heart Rate Range (THRR);Understanding of Exercise Prescription  Increase Physical Activity;Increase Strength and Stamina;Understanding of Exercise Prescription  -  Increase Physical Activity;Increase Strength and Stamina;Able to understand and use rate of perceived exertion (RPE) scale;Able to check pulse independently;Understanding of Exercise Prescription  Increase Physical Activity;Increase Strength and  Stamina;Understanding of Exercise Prescription   Comments  Reviewed RPE scale, THR and program prescription with pt today.  Pt voiced understanding and was given a copy of goals to take home.  [place] is off to a good start in rehab. She is already up to level 3 on the NuStep.  We will continue to monitor her progress and review home exercise guidelines.  out since last review  Reviewed home exercise with pt today.  Pt plans to walk and use videos for exercise.  Reviewed THR, pulse, RPE, sign and symptoms, NTG use, and when to call 911 or MD.  Also discussed weather considerations and indoor options.  Pt voiced understanding.  Adie is doing well in rehab.  She is doing some of her exercise at home as she has been caring for her husband after a fall.  She is feeling stronger overall.   Expected Outcomes  Short: Use RPE daily to regulate intensity. Long: Follow program prescription in THR.  Short: Review home exercise.  Long: Continue to follow program prescription.  -  Short - add 1 day of exercise per week at home Long - exercise on her own  Short: Exercise more at home.  Long: Continue to increase strength and stamina.      Discharge Exercise Prescription (Final Exercise Prescription Changes): Exercise Prescription Changes - 02/28/19 1400      Response to Exercise   Blood Pressure (Admit)  100/62    Blood Pressure (Exercise)  142/80    Blood Pressure (Exit)  102/62    Heart Rate (Admit)  88 bpm    Heart Rate (Exercise)  129 bpm    Heart Rate (Exit)  117 bpm    Rating of Perceived Exertion (Exercise)  13    Symptoms  none    Duration  Continue with 30 min of aerobic exercise without signs/symptoms of physical distress.    Intensity  THRR unchanged      Progression   Progression  Continue to progress workloads to maintain intensity without signs/symptoms of physical distress.    Average METs  2.07      Resistance Training   Training Prescription  Yes    Weight  3 lbs    Reps  10-15       Interval Training   Interval Training  No      NuStep   Level  3    Minutes  15    METs  2.7      Recumbant Elliptical   Level  1    Minutes  15    METs  1.4      REL-XR   Level  1    Minutes  15    METs  2.2      T5 Nustep   Level  1    Minutes  15    METs  2       Nutrition:  Target Goals: Understanding of nutrition guidelines, daily intake of sodium <1566m, cholesterol <2077m calories 30% from fat and 7% or less from saturated fats, daily to  have 5 or more servings of fruits and vegetables.  Biometrics: Pre Biometrics - 02/11/19 1230      Pre Biometrics   Height  '5\' 2"'  (1.575 m)    Weight  140 lb 6.4 oz (63.7 kg)    BMI (Calculated)  25.67    Single Leg Stand  20.02 seconds        Nutrition Therapy Plan and Nutrition Goals: Nutrition Therapy & Goals - 02/11/19 1155      Nutrition Therapy   Diet  HH, DM diet    Protein (specify units)  50g    Fiber  25 grams    Whole Grain Foods  3 servings    Saturated Fats  12 max. grams    Fruits and Vegetables  5 servings/day    Sodium  1.5 grams      Personal Nutrition Goals   Nutrition Goal  ST: try to add soluble fiber and reduce fatty food intake to manage diarrhea caused by medications LT: gain strength    Comments  Pt with Addisons disease, pt reports trying to eat enough sodium. Discussed HH and DM eating and diarrhea managemnet as she reports her medication can trigger diarrhea. Pt reports eating bagel with cream cheese (whole wheat), coffee with 2% milk and sweetener. tomato sandwich on whole wheat for lunch, and meat with 2 vegetables for dinner. (pork chops or fish (fried), chicken thighs without skin roasted)(corn, beans, peas, potatoes) pt reports also having fruit like melons (all different kinds). Pt does not tolerate bananas well.      Intervention Plan   Intervention  Prescribe, educate and counsel regarding individualized specific dietary modifications aiming towards targeted core components such as  weight, hypertension, lipid management, diabetes, heart failure and other comorbidities.;Nutrition handout(s) given to patient.    Expected Outcomes  Short Term Goal: Understand basic principles of dietary content, such as calories, fat, sodium, cholesterol and nutrients.;Short Term Goal: A plan has been developed with personal nutrition goals set during dietitian appointment.;Long Term Goal: Adherence to prescribed nutrition plan.       Nutrition Assessments:   Nutrition Goals Re-Evaluation: Nutrition Goals Re-Evaluation    [place] Name 03/11/19 1132             Goals   Nutrition Goal  ST: try to add soluble fiber and reduce fatty food intake to manage diarrhea caused by medications LT: gain strength       Comment  Pt reports still has bad diarrhea since we last spoke a month ago, again encouraged her to speak to her doctor (pt thinks its her medicaitons), pt stays away from insoluble fiber due to diarrhea exacerbation - encouraged simple foods that her body can tolerate to help symptoms. Expresssed concern with dehydration or malnutrition with continued diarrhea.       Expected Outcome  Pt will contact doctor regarding diarrhea and diahhrea will subside.          Nutrition Goals Discharge (Final Nutrition Goals Re-Evaluation): Nutrition Goals Re-Evaluation - 03/11/19 1132      Goals   Nutrition Goal  ST: try to add soluble fiber and reduce fatty food intake to manage diarrhea caused by medications LT: gain strength    Comment  Pt reports still has bad diarrhea since we last spoke a month ago, again encouraged her to speak to her doctor (pt thinks its her medicaitons), pt stays away from insoluble fiber due to diarrhea exacerbation - encouraged simple foods that her body can tolerate to  help symptoms. Expresssed concern with dehydration or malnutrition with continued diarrhea.    Expected Outcome  Pt will contact doctor regarding diarrhea and diahhrea will subside.        Psychosocial: Target Goals: Acknowledge presence or absence of significant depression and/or stress, maximize coping skills, provide positive support system. Participant is able to verbalize types and ability to use techniques and skills needed for reducing stress and depression.   Initial Review & Psychosocial Screening: Initial Psych Review & Screening - 02/07/19 1220      Initial Review   Current issues with  Current Stress Concerns    Source of Stress Concerns  Retirement/disability;Family    Comments  Always worked and had to quit work. Not having anything to do.  COVID 19 restricting ability to socialize.   Children making sure they are carede for if something happens to her.      Family Dynamics   Good Support System?  Yes   Husband, Family, neighbors/friends and church     Barriers   Psychosocial barriers to participate in program  There are no identifiable barriers or psychosocial needs.;The patient should benefit from training in stress management and relaxation.      Screening Interventions   Interventions  Encouraged to exercise;Provide feedback about the scores to participant;To provide support and resources with identified psychosocial needs    Expected Outcomes  Short Term goal: Utilizing psychosocial counselor, staff and physician to assist with identification of specific Stressors or current issues interfering with healing process. Setting desired goal for each stressor or current issue identified.;Long Term Goal: Stressors or current issues are controlled or eliminated.;Short Term goal: Identification and review with participant of any Quality of Life or Depression concerns found by scoring the questionnaire.;Long Term goal: The participant improves quality of Life and PHQ9 Scores as seen by post scores and/or verbalization of changes       Quality of Life Scores:   Scores of 19 and below usually indicate a poorer quality of life in these areas.  A difference of  2-3  points is a clinically meaningful difference.  A difference of 2-3 points in the total score of the Quality of Life Index has been associated with significant improvement in overall quality of life, self-image, physical symptoms, and general health in studies assessing change in quality of life.  PHQ-9: Recent Review Flowsheet Data    Depression screen [facility] 2/9 12/10/2018 07/12/2018 07/10/2017 07/08/2016 05/29/2015   Decreased Interest 0 1 0 0 2   Down, Depressed, Hopeless 1 0 0 0 2   PHQ - 2 Score 1 1 0 0 4   Altered sleeping - - - - 3   Tired, decreased energy - - - - 3   Change in appetite - - - - 1   Feeling bad or failure about yourself  - - - - 1   Trouble concentrating - - - - 1   Moving slowly or fidgety/restless - - - - 1   Suicidal thoughts - - - - 0   PHQ-9 Score - - - - 14   Difficult doing work/chores - - - - Very difficult     Interpretation of Total Score  Total Score Depression Severity:  1-4 = Minimal depression, 5-9 = Mild depression, 10-14 = Moderate depression, 15-19 = Moderately severe depression, 20-27 = Severe depression   Psychosocial Evaluation and Intervention:   Psychosocial Re-Evaluation: Psychosocial Re-Evaluation    [place] Name 03/11/19 1105 03/13/19 1116  Psychosocial Re-Evaluation   Current issues with  Current Stress Concerns  Current Stress Concerns      Comments  Serita spoke with her Dr about stress - she has been really upset since she stopped breathing.  Her Dr suggested counseling.  Jaylen is doing okay.  Her husband fell and broke his knee cap and she has been caring for him.  He is making her crazy.  Her doctor thinks she may PTSD.  She talked with someone this morning and was given a reference for someone to meet with.,      Expected Outcomes  Short - staff will have counselor call Vivianne Master will meet with counselor as needed  Short: Reach out to reference counselor. Long: Continue to seek self care.      Interventions  Encouraged to  attend Cardiac Rehabilitation for the exercise  Encouraged to attend Cardiac Rehabilitation for the exercise      Continue Psychosocial Services   -  Follow up required by staff         Psychosocial Discharge (Final Psychosocial Re-Evaluation): Psychosocial Re-Evaluation - 03/13/19 1116      Psychosocial Re-Evaluation   Current issues with  Current Stress Concerns    Comments  Bertrice is doing okay.  Her husband fell and broke his knee cap and she has been caring for him.  He is making her crazy.  Her doctor thinks she may PTSD.  She talked with someone this morning and was given a reference for someone to meet with.,    Expected Outcomes  Short: Reach out to reference counselor. Long: Continue to seek self care.    Interventions  Encouraged to attend Cardiac Rehabilitation for the exercise    Continue Psychosocial Services   Follow up required by staff       Vocational Rehabilitation: Provide vocational rehab assistance to qualifying candidates.   Vocational Rehab Evaluation & Intervention: Vocational Rehab - 02/07/19 1224      Initial Vocational Rehab Evaluation & Intervention   Assessment shows need for Vocational Rehabilitation  No       Education: Education Goals: Education classes will be provided on a variety of topics geared toward better understanding of heart health and risk factor modification. Participant will state understanding/return demonstration of topics presented as noted by education test scores.  Learning Barriers/Preferences: Learning Barriers/Preferences - 02/07/19 1224      Learning Barriers/Preferences   Learning Barriers  None    Learning Preferences  None       Education Topics:  AED/CPR: - Group verbal and written instruction with the use of models to demonstrate the basic use of the AED with the basic ABC's of resuscitation.   General Nutrition Guidelines/Fats and Fiber: -Group instruction provided by verbal, written material, models and posters  to present the general guidelines for heart healthy nutrition. Gives an explanation and review of dietary fats and fiber.   Controlling Sodium/Reading Food Labels: -Group verbal and written material supporting the discussion of sodium use in heart healthy nutrition. Review and explanation with models, verbal and written materials for utilization of the food label.   Exercise Physiology & General Exercise Guidelines: - Group verbal and written instruction with models to review the exercise physiology of the cardiovascular system and associated critical values. Provides general exercise guidelines with specific guidelines to those with heart or lung disease.    Aerobic Exercise & Resistance Training: - Gives group verbal and written instruction on the various components of exercise.  Focuses on aerobic and resistive training programs and the benefits of this training and how to safely progress through these programs..   Flexibility, Balance, Mind/Body Relaxation: Provides group verbal/written instruction on the benefits of flexibility and balance training, including mind/body exercise modes such as yoga, pilates and tai chi.  Demonstration and skill practice provided.   Stress and Anxiety: - Provides group verbal and written instruction about the health risks of elevated stress and causes of high stress.  Discuss the correlation between heart/lung disease and anxiety and treatment options. Review healthy ways to manage with stress and anxiety.   Depression: - Provides group verbal and written instruction on the correlation between heart/lung disease and depressed mood, treatment options, and the stigmas associated with seeking treatment.   Anatomy & Physiology of the Heart: - Group verbal and written instruction and models provide basic cardiac anatomy and physiology, with the coronary electrical and arterial systems. Review of Valvular disease and Heart Failure   Cardiac Procedures: -  Group verbal and written instruction to review commonly prescribed medications for heart disease. Reviews the medication, class of the drug, and side effects. Includes the steps to properly store meds and maintain the prescription regimen. (beta blockers and nitrates)   Cardiac Medications I: - Group verbal and written instruction to review commonly prescribed medications for heart disease. Reviews the medication, class of the drug, and side effects. Includes the steps to properly store meds and maintain the prescription regimen.   Cardiac Medications II: -Group verbal and written instruction to review commonly prescribed medications for heart disease. Reviews the medication, class of the drug, and side effects. (all other drug classes)    Go Sex-Intimacy & Heart Disease, Get SMART - Goal Setting: - Group verbal and written instruction through game format to discuss heart disease and the return to sexual intimacy. Provides group verbal and written material to discuss and apply goal setting through the application of the S.M.A.R.T. Method.   Other Matters of the Heart: - Provides group verbal, written materials and models to describe Stable Angina and Peripheral Artery. Includes description of the disease process and treatment options available to the cardiac patient.   Exercise & Equipment Safety: - Individual verbal instruction and demonstration of equipment use and safety with use of the equipment.   Cardiac Rehab from 02/11/2019 in [facility] Cardiac and Pulmonary Rehab  Date  02/11/19  Educator  [place]  Instruction Review Code  1- Verbalizes Understanding      Infection Prevention: - Provides verbal and written material to individual with discussion of infection control including proper hand washing and proper equipment cleaning during exercise session.   Cardiac Rehab from 02/11/2019 in [facility] Cardiac and Pulmonary Rehab  Date  02/11/19  Educator  [place]  Instruction Review Code  1- Verbalizes  Understanding      Falls Prevention: - Provides verbal and written material to individual with discussion of falls prevention and safety.   Cardiac Rehab from 02/11/2019 in [facility] Cardiac and Pulmonary Rehab  Date  02/11/19  Educator  [place]  Instruction Review Code  1- Verbalizes Understanding      Diabetes: - Individual verbal and written instruction to review signs/symptoms of diabetes, desired ranges of glucose level fasting, after meals and with exercise. Acknowledge that pre and post exercise glucose checks will be done for 3 sessions at entry of program.   Cardiac Rehab from 02/11/2019 in [facility] Cardiac and Pulmonary Rehab  Date  02/11/19  Educator  [place]  Instruction Review Code  1- Verbalizes Understanding      Know Your Numbers and Risk Factors: -Group verbal and written instruction about important numbers in your health.  Discussion of what are risk factors and how they play a role in the disease process.  Review of Cholesterol, Blood Pressure, Diabetes, and BMI and the role they play in your overall health.   Sleep Hygiene: -Provides group verbal and written instruction about how sleep can affect your health.  Define sleep hygiene, discuss sleep cycles and impact of sleep habits. Review good sleep hygiene tips.    Other: -Provides group and verbal instruction on various topics (see comments)   Knowledge Questionnaire Score: Knowledge Questionnaire Score - 02/11/19 0909      Knowledge Questionnaire Score   Pre Score  18/26  Missed Nutrition, Angina and Exercise questions       Core Components/Risk Factors/Patient Goals at Admission: Personal Goals and Risk Factors at Admission - 02/07/19 1225      Core Components/Risk Factors/Patient Goals on Admission    Weight Management  Yes;Weight Maintenance    Intervention  Weight Management: Develop a combined nutrition and exercise program designed to reach desired caloric intake, while maintaining appropriate intake of nutrient and  fiber, sodium and fats, and appropriate energy expenditure required for the weight goal.;Weight Management: Provide education and appropriate resources to help participant work on and attain dietary goals.    Expected Outcomes  Short Term: Continue to assess and modify interventions until short term weight is achieved;Long Term: Adherence to nutrition and physical activity/exercise program aimed toward attainment of established weight goal    Diabetes  Yes    Intervention  Provide education about signs/symptoms and action to take for hypo/hyperglycemia.;Provide education about proper nutrition, including hydration, and aerobic/resistive exercise prescription along with prescribed medications to achieve blood glucose in normal ranges: Fasting glucose 65-99 mg/dL    Expected Outcomes  Short Term: Participant verbalizes understanding of the signs/symptoms and immediate care of hyper/hypoglycemia, proper foot care and importance of medication, aerobic/resistive exercise and nutrition plan for blood glucose control.;Long Term: Attainment of HbA1C < 7%.    Hypertension  Yes    Intervention  Provide education on lifestyle modifcations including regular physical activity/exercise, weight management, moderate sodium restriction and increased consumption of fresh fruit, vegetables, and low fat dairy, alcohol moderation, and smoking cessation.;Monitor prescription use compliance.    Expected Outcomes  Short Term: Continued assessment and intervention until BP is < 140/25m HG in hypertensive participants. < 130/869mHG in hypertensive participants with diabetes, heart failure or chronic kidney disease.;Long Term: Maintenance of blood pressure at goal levels.    Lipids  Yes    Intervention  Provide education and support for participant on nutrition & aerobic/resistive exercise along with prescribed medications to achieve LDL <704mHDL >41m77m  Expected Outcomes  Short Term: Participant states understanding of  desired cholesterol values and is compliant with medications prescribed. Participant is following exercise prescription and nutrition guidelines.;Long Term: Cholesterol controlled with medications as prescribed, with individualized exercise RX and with personalized nutrition plan. Value goals: LDL < 70mg74mL > 40 mg.       Core Components/Risk Factors/Patient Goals Review:  Goals and Risk Factor Review    Row Name 03/11/19 1102 03/11/19 1110           Core Components/Risk Factors/Patient Goals Review   Personal Goals Review  Weight Management/Obesity;Hypertension  -      Review  Kays Dr added metoprolol to her medications - her  HR got high last week.  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  Kays Dr added metoprolol to her medications - her HR got high last week.  She has a machine at home to send her info to manage pacemaker.She checks BG at home - it runs around 155 - her Dr is ok with it being up to 180 due to taking steroids for Addisons disease      Expected Outcomes  -  Short - keep checking BG at home and keep in close contact with your Dr/PA Long - keep BG and BP withon limits set by Dr         Core Components/Risk Factors/Patient Goals at Discharge (Final Review):  Goals and Risk Factor Review - 03/11/19 1110      Core Components/Risk Factors/Patient Goals Review   Review  Kays Dr added metoprolol to her medications - her HR got high last week.  She has a machine at home to send her info to manage pacemaker.She checks BG at home - it runs around 155 - her Dr is ok with it being up to 180 due to taking steroids for Addisons disease    Expected Outcomes  Short - keep checking BG at home and keep in close contact with your Dr/PA Long - keep BG and BP withon limits set by Dr       Evelena Peat Comments: ITP Comments    Row Name 02/07/19 1217 02/13/19 1035 02/20/19 0619 03/06/19 1125 03/20/19 0628   ITP Comments  Orientation completed virtually today   Appointment on  Mon 8/3  ro EP and RD evals and gym orientation Documentation of diagnosis can be found in [facility] 08/13/18  First full day of exercise!  Patient was oriented to gym and equipment including functions, settings, policies, and procedures.  Patient's individual exercise prescription and treatment plan were reviewed.  All starting workloads were established based on the results of the 6 minute walk test done at initial orientation visit.  The plan for exercise progression was also introduced and progression will be customized based on patient's performance and goals.  30 Day Review Completed today. Continue with ITP unless changed by Medical Director review.  New to program  Called to check on pt.  She was cleared by cardiology to return to rehab on 8/18 but she has not been back since then.  Left message.  30 Day review. Continue with ITP unless directed changes per Medical Director review.      Comments:

## 2019-03-20 NOTE — Progress Notes (Signed)
Daily Session Note  Patient Details  Name: Haley Young MRN: [id number] Date of Birth: August 28, 1950 Referring Provider:     Cardiac Rehab from 02/11/2019 in [facility] Cardiac and Pulmonary Rehab  Referring Provider  JD      Encounter Date: 03/20/2019  Check In: Session Check In - 03/20/19 1034      Check-In   Supervising physician immediately available to respond to emergencies  See telemetry face sheet for immediately available ER MD    Location  [facility]-Cardiac & Pulmonary Rehab    Staff Present  Heath Lark, RN, BSN, CCRP;Joseph Hood RCP,RRT,BSRT;Jessica [place], [place], [place], [place], CCET    Virtual Visit  No    Medication changes reported      No    Fall or balance concerns reported     No    Warm-up and Cool-down  Performed on first and last piece of equipment    Resistance Training Performed  Yes    VAD Patient?  No    PAD/SET Patient?  No      Pain Assessment   Currently in Pain?  No/denies          Social History   Tobacco Use  Smoking Status Never Smoker  Smokeless Tobacco Never Used    Goals Met:  Independence with exercise equipment Exercise tolerated well No report of cardiac concerns or symptoms Strength training completed today  Goals Unmet:  Not Applicable  Comments: Pt able to follow exercise prescription today without complaint.  Will continue to monitor for progression.    Dr. Emily Filbert is Medical Director for [place] and [facility].

## 2019-03-22 ENCOUNTER — Other Ambulatory Visit: Payer: Self-pay

## 2019-03-22 ENCOUNTER — Encounter: Payer: PPO | Admitting: *Deleted

## 2019-03-22 DIAGNOSIS — I214 Non-ST elevation (NSTEMI) myocardial infarction: Secondary | ICD-10-CM | POA: Diagnosis not present

## 2019-03-22 NOTE — Progress Notes (Signed)
Daily Session Note  Patient Details  Name: Haley Young MRN: [id number] Date of Birth: Jan 03, 1951 Referring Provider:     Cardiac Rehab from 02/11/2019 in [facility] Cardiac and Pulmonary Rehab  Referring Provider  JD      Encounter Date: 03/22/2019  Check In: Session Check In - 03/22/19 1038      Check-In   Supervising physician immediately available to respond to emergencies  See telemetry face sheet for immediately available ER MD    Location  [facility]-Cardiac & Pulmonary Rehab    Staff Present  Heath Lark, RN, BSN, CCRP;Jessica [place], MA, RCEP, CCRP, [place], [place], ACSM CEP, Exercise Physiologist    Virtual Visit  No    Medication changes reported      No    Fall or balance concerns reported     No    Warm-up and Cool-down  Performed on first and last piece of equipment    Resistance Training Performed  Yes    VAD Patient?  No    PAD/SET Patient?  No      Pain Assessment   Currently in Pain?  No/denies          Social History   Tobacco Use  Smoking Status Never Smoker  Smokeless Tobacco Never Used    Goals Met:  Independence with exercise equipment Exercise tolerated well No report of cardiac concerns or symptoms  Goals Unmet:  Not Applicable  Comments: Pt able to follow exercise prescription today without complaint.  Will continue to monitor for progression.    Dr. Emily Filbert is Medical Director for [facility] and [facility].

## 2019-03-25 ENCOUNTER — Other Ambulatory Visit: Payer: Self-pay

## 2019-03-25 ENCOUNTER — Telehealth: Payer: Self-pay

## 2019-03-25 ENCOUNTER — Encounter: Payer: PPO | Admitting: *Deleted

## 2019-03-25 DIAGNOSIS — I214 Non-ST elevation (NSTEMI) myocardial infarction: Secondary | ICD-10-CM | POA: Diagnosis not present

## 2019-03-25 NOTE — Progress Notes (Signed)
Daily Session Note  Patient Details  Name: Haley Young MRN: [id number] Date of Birth: May 09, 1951 Referring Provider:     Cardiac Rehab from 02/11/2019 in [facility] Cardiac and Pulmonary Rehab  Referring Provider  JD      Encounter Date: 03/25/2019  Check In: Session Check In - 03/25/19 1112      Check-In   Supervising physician immediately available to respond to emergencies  See telemetry face sheet for immediately available ER MD    Location  [facility]-Cardiac & Pulmonary Rehab    Staff Present  Heath Lark, RN, BSN, CCRP;Joseph Tedd [place], [place], ACSM CEP, Exercise Physiologist;Meredith Sherryll Burger, RN BSN    Virtual Visit  No    Medication changes reported      No    Fall or balance concerns reported     No    Warm-up and Cool-down  Performed on first and last piece of equipment    Resistance Training Performed  Yes    VAD Patient?  No    PAD/SET Patient?  No      Pain Assessment   Currently in Pain?  No/denies          Social History   Tobacco Use  Smoking Status Never Smoker  Smokeless Tobacco Never Used    Goals Met:  Independence with exercise equipment Exercise tolerated well No report of cardiac concerns or symptoms  Goals Unmet:  Not Applicable  Comments: Pt able to follow exercise prescription today without complaint.  Will continue to monitor for progression.    Dr. Emily Filbert is Medical Director for [place] and [facility].

## 2019-03-25 NOTE — Telephone Encounter (Signed)
Copied from [place] [phone]. Topic: General - Other >> Mar 25, 2019  2:10 PM Rainey Pines A wrote: Patient would like a callback today from Dr. Derrel Nip nurse in regards to colonoscopy referral. Patient stated that she inquired last week but never got a callback. Did not see any notation in patients chart of who she spoke with.

## 2019-03-26 ENCOUNTER — Telehealth: Payer: Self-pay

## 2019-03-26 NOTE — Telephone Encounter (Signed)
See previous message

## 2019-03-26 NOTE — Telephone Encounter (Signed)
Copied from Ali Molina [phone]. Topic: General - Other >> Mar 25, 2019  2:10 PM Rainey Pines A wrote: Patient would like a callback today from Dr. Derrel Nip nurse in regards to colonoscopy referral. Patient stated that she inquired last week but never got a callback. Did not see any notation in patients chart of who she spoke with. >> Mar 25, 2019  5:16 PM Mcneil, Ja-Kwan wrote: Pt stated she never received a call back as she requested earlier today. Pt requests call back from Dr. Lupita Dawn nurse.

## 2019-03-26 NOTE — Telephone Encounter (Signed)
Pt is having incontrollable diahrrea and had called twice yesterday, she needs to have a call back ASAP [phone]. Please call back !

## 2019-03-26 NOTE — Telephone Encounter (Signed)
FYI

## 2019-03-26 NOTE — Telephone Encounter (Signed)
Called and spoke to patient.  Patient said that she had diarrhea yesterday but has not has any episodes today. Patient said that she has on going diarrhea for the past month sporadically with occasionally abdominal pain.  No other symptoms. Patient also requesting a referral for a colonoscopy.  Patient was scheduled a virtual visit w/ Dr. Derrel Nip on 03/29/19 @ 8:00 am.  Patient advise to go to UC if diarrhea returns and worsens.

## 2019-03-27 ENCOUNTER — Telehealth: Payer: Self-pay

## 2019-03-27 NOTE — Telephone Encounter (Signed)
Haley Young is having stomach issues and cannot attend today

## 2019-03-29 ENCOUNTER — Other Ambulatory Visit: Payer: Self-pay

## 2019-03-29 ENCOUNTER — Ambulatory Visit (INDEPENDENT_AMBULATORY_CARE_PROVIDER_SITE_OTHER): Payer: PPO | Admitting: Internal Medicine

## 2019-03-29 ENCOUNTER — Telehealth: Payer: Self-pay | Admitting: Internal Medicine

## 2019-03-29 DIAGNOSIS — I214 Non-ST elevation (NSTEMI) myocardial infarction: Secondary | ICD-10-CM

## 2019-03-29 DIAGNOSIS — E1165 Type 2 diabetes mellitus with hyperglycemia: Secondary | ICD-10-CM | POA: Diagnosis not present

## 2019-03-29 DIAGNOSIS — Z794 Long term (current) use of insulin: Secondary | ICD-10-CM

## 2019-03-29 DIAGNOSIS — R197 Diarrhea, unspecified: Secondary | ICD-10-CM | POA: Diagnosis not present

## 2019-03-29 MED ORDER — INSULIN PEN NEEDLE 32G X 4 MM MISC: 12 refills | Status: DC

## 2019-03-29 MED ORDER — LANTUS SOLOSTAR 100 UNIT/ML ~~LOC~~ SOPN: 15.0000 [IU] | PEN_INJECTOR | Freq: Every day | SUBCUTANEOUS | 99 refills | Status: DC

## 2019-03-29 NOTE — Progress Notes (Signed)
Virtual Visit via [email or web address]  This visit type was conducted due to national recommendations for restrictions regarding the COVID-19 pandemic (e.g. social distancing).  This format is felt to be most appropriate for this patient at this time.  All issues noted in this document were discussed and addressed.  No physical exam was performed (except for noted visual exam findings with Video Visits).   I connected with@ on 03/29/19 at  8:00 AM EDT by a video enabled telemedicine application and verified that I am speaking with the correct person using two identifiers. Location patient: home Location provider: work or home office Persons participating in the virtual visit: patient, provider  I discussed the limitations, risks, security and privacy concerns of performing an evaluation and management service by telephone and the availability of in person appointments. I also discussed with the patient that there may be a patient responsible charge related to this service. The patient expressed understanding and agreed to proceed.  Reason for visit: follow up HPI:  68 yr old female with complicated medical history presents with   1) diarrhea: sporadic , episodic  Ranges from 1 to 4 times daily , watery in nature , no solid stools for the past month.  No recent use of antibiotics in over 6 months.  Mild cramping,  But stomach noises have been loud and active.  Started taking an OTC diabetes supplement called glucocil  around the  Same time the diarrhea started .  2) type 2 DM : uncontrolled,  Seeing Endocrinology next month. [place] 130. 156    ROS: See pertinent positives and negatives per HPI.  Past Medical History:  Diagnosis Date  . Asthma   . Chicken pox   . Depression   . Diabetes mellitus   . Frequent UTI   . GERD (gastroesophageal reflux disease)   . Heart disease   . Heart murmur   . 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 Hx of colonic polyps   . Hyperhidrosis     especially with hot flashes    . Hyperlipidemia   . Lipoma    R axilla   . MRSA cellulitis    surgical wound infection  . Osteoporosis   . Pyelocystitis    as a child   . Thyroid disease   . Urinary incontinence     Past Surgical History:  Procedure Laterality Date  . ABDOMINAL HYSTERECTOMY  1970  . [place], 2005   pulmonary stenosis with valve replacement, Dr. Evelina Dun  . PARTIAL HYSTERECTOMY  1970  . PULMONARY VEIN STENOSIS REPAIR    . VALVE REPLACEMENT      Family History  Problem Relation Age of Onset  . Coarctation of the aorta Sister   . Diabetes Mother   . COPD Mother   . Obesity Mother   . Depression Mother   . Pancreatic cancer Mother   . Alcohol abuse Father   . Cirrhosis Father   . Down syndrome Brother   . Pneumonia Brother   . Down syndrome Daughter   . Lupus Daughter   . Ulcerative colitis Daughter   . Diabetes Maternal Aunt     SOCIAL HX:  reports that she has never smoked. She has never used smokeless tobacco. She reports current alcohol use. She reports that she does not use drugs.   Current Outpatient Medications:  .  metoprolol succinate (TOPROL-XL) 25 MG 24 hr tablet, Take by mouth., Disp: , Rfl:  .  apixaban (ELIQUIS) 5 MG TABS tablet, Take 1 tablet (5 mg total) by mouth 2 (two) times daily., Disp: 60 tablet, Rfl: 11 .  aspirin 81 MG chewable tablet, Chew 81 mg by mouth daily., Disp: , Rfl:  .  atorvastatin (LIPITOR) 40 MG tablet, Take 40 mg by mouth daily., Disp: , Rfl:  .  Fludrocortisone Acetate (FLORINEF PO), Take 0.1 mg by mouth daily., Disp: , Rfl:  .  FLUoxetine (PROZAC) 10 MG tablet, Take 1 tablet by mouth once daily, Disp: 90 tablet, Rfl: 1 .  furosemide (LASIX) 20 MG tablet, Take 20 mg by mouth daily. , Disp: , Rfl:  .  glipiZIDE (GLUCOTROL) 10 MG tablet, Take 1 tablet (10 mg total) by mouth 2 (two) times daily before a meal., Disp: 180 tablet, Rfl: 3 .  glucose blood (ONE TOUCH ULTRA TEST) test strip, USE ONE STRIP TO  CHECK GLUCOSE THREE TIMES DAILY AS DIRECTED E11.65, Disp: 200 each, Rfl: 2 .  hydrocortisone (CORTEF) 10 MG tablet, Take 5 mg by mouth 2 (two) times daily. 10 mg QAM, 5 mg lunch, 5 PM, Disp: , Rfl:  .  hydrocortisone sodium succinate (SOLU-CORTEF) 100 MG SOLR injection, Use 1 injection for adrenal crisis, Disp: , Rfl:  .  Insulin Glargine (LANTUS SOLOSTAR) 100 UNIT/ML Solostar Pen, Inject 15 Units into the skin daily. ADJUST UPWARD AS NEEDED EVERY 3 DAYS, Disp: 5 pen, Rfl: PRN .  Insulin Pen Needle 32G X 4 MM MISC, Used to give daily insulin injections., Disp: 100 each, Rfl: 12 .  levothyroxine (SYNTHROID) 112 MCG tablet, Take 1 tablet (112 mcg total) by mouth daily., Disp: 90 tablet, Rfl: 0 .  mirtazapine (REMERON) 7.5 MG tablet, TAKE 1 TABLET BY MOUTH AT BEDTIME, Disp: 90 tablet, Rfl: 1 .  Potassium Chloride ER 20 MEQ TBCR, Take 1 tablet by mouth once daily, Disp: 30 tablet, Rfl: 2 .  Semaglutide, 1 MG/DOSE, (OZEMPIC, 1 MG/DOSE,) 2 MG/1.5ML SOPN, Inject into the skin. Inject into stomach 1 time weekly, Disp: , Rfl:  .  traZODone (DESYREL) 150 MG tablet, TAKE 1 TABLET BY MOUTH AT BEDTIME, Disp: 90 tablet, Rfl: 1  EXAM:  VITALS per patient if applicable:  GENERAL: alert, oriented, appears well and in no acute distress  HEENT: atraumatic, conjunttiva clear, no obvious abnormalities on inspection of external nose and ears  NECK: normal movements of the head and neck  LUNGS: on inspection no signs of respiratory distress, breathing rate appears normal, no obvious gross SOB, gasping or wheezing  CV: no obvious cyanosis  MS: moves all visible extremities without noticeable abnormality  PSYCH/NEURO: pleasant and cooperative, no obvious depression or anxiety, speech and thought processing grossly intact  ASSESSMENT AND PLAN:  Discussed the following assessment and plan:  Diarrhea in adult patient  Type 2 diabetes mellitus with hyperglycemia, with long-term current use of insulin  (HCC)  Diarrhea in adult patient LIKELY due to otc med she stared a month ago for diabetes mgmgt called glucocil.  Advised to suspend metformin and glucocil.  Continue glipzidie 10 mg bid and once weekly ozempic  Starting lantus 15 untis daily    Type 2 diabetes mellitus with hyperglycemia, with long-term current use of insulin ([place]) Advised t stop the otc supplement called glucosil .  Adding basal insulin starting with 15 units daily using Lantus solostar     I discussed the assessment and treatment plan with the patient. The patient was provided an opportunity to ask questions and all were answered. The patient  agreed with the plan and demonstrated an understanding of the instructions.   The patient was advised to call back or seek an in-person evaluation if the symptoms worsen or if the condition fails to improve as anticipated.   I provided  25 minutes of non-face-to-face time during this encounter reviewing patient's current problems and post surgeries.  Providing counseling on the above mentioned problems , and coordination  of care .   [name], MD

## 2019-03-29 NOTE — Progress Notes (Signed)
Daily Session Note  Patient Details  Name: Haley Young MRN: [id number] Date of Birth: August 04, 1950 Referring Provider:     Cardiac Rehab from 02/11/2019 in [facility] Cardiac and Pulmonary Rehab  Referring Provider  JD      Encounter Date: 03/29/2019  Check In: Session Check In - 03/29/19 1042      Check-In   Supervising physician immediately available to respond to emergencies  See telemetry face sheet for immediately available ER MD    Location  [facility]-Cardiac & Pulmonary Rehab    Staff Present  Vida Rigger RN, Vickki Hearing, BA, ACSM CEP, Exercise Physiologist;Jessica Marble, MA, RCEP, CCRP, CCET;Joseph Miller [employer] [place] [employer]    Virtual Visit  No    Medication changes reported      No    Fall or balance concerns reported     No    Warm-up and Cool-down  Performed on first and last piece of equipment    Resistance Training Performed  Yes    VAD Patient?  No    PAD/SET Patient?  No      Pain Assessment   Currently in Pain?  No/denies    Multiple Pain Sites  No          Social History   Tobacco Use  Smoking Status Never Smoker  Smokeless Tobacco Never Used    Goals Met:  Independence with exercise equipment Exercise tolerated well No report of cardiac concerns or symptoms Strength training completed today  Goals Unmet:  Not Applicable  Comments: Pt able to follow exercise prescription today without complaint.  Will continue to monitor for progression.   Dr. Emily Filbert is Medical Director for [place] and [facility].

## 2019-03-29 NOTE — Assessment & Plan Note (Signed)
LIKELY due to otc med she stared a month ago for diabetes mgmgt called glucocil.  Advised to suspend metformin and glucocil.  Continue glipzidie 10 mg bid and once weekly ozempic  Starting lantus 15 untis daily

## 2019-03-31 NOTE — Assessment & Plan Note (Addendum)
Advised t stop the otc supplement called glucosil .  Adding basal insulin starting with 15 units daily using Lantus solostar

## 2019-04-01 ENCOUNTER — Other Ambulatory Visit: Payer: Self-pay

## 2019-04-01 ENCOUNTER — Encounter: Payer: PPO | Admitting: *Deleted

## 2019-04-01 DIAGNOSIS — I214 Non-ST elevation (NSTEMI) myocardial infarction: Secondary | ICD-10-CM

## 2019-04-01 NOTE — Progress Notes (Signed)
Daily Session Note  Patient Details  Name: Haley Young MRN: [id number] Date of Birth: 02-09-1951 Referring Provider:     Cardiac Rehab from 02/11/2019 in [facility] Cardiac and Pulmonary Rehab  Referring Provider  JD      Encounter Date: 04/01/2019  Check In: Session Check In - 04/01/19 1044      Check-In   Supervising physician immediately available to respond to emergencies  See telemetry face sheet for immediately available ER MD    Location  [facility]-Cardiac & Pulmonary Rehab    Staff Present  Renita Papa, RN Moises Blood, BS, ACSM CEP, Exercise Physiologist;Joseph Tessie Fass RCP,RRT,BSRT    Virtual Visit  No    Medication changes reported      Yes    Comments  increased Lantus 15 units    Fall or balance concerns reported     No    Warm-up and Cool-down  Performed on first and last piece of equipment    Resistance Training Performed  Yes    VAD Patient?  No    PAD/SET Patient?  No      Pain Assessment   Currently in Pain?  No/denies          Social History   Tobacco Use  Smoking Status Never Smoker  Smokeless Tobacco Never Used    Goals Met:  Independence with exercise equipment Exercise tolerated well No report of cardiac concerns or symptoms Strength training completed today  Goals Unmet:  Not Applicable  Comments: Pt able to follow exercise prescription today without complaint.  Will continue to monitor for progression.    Dr. Emily Filbert is Medical Director for [place] and [facility].

## 2019-04-03 ENCOUNTER — Other Ambulatory Visit: Payer: Self-pay

## 2019-04-03 ENCOUNTER — Encounter: Payer: PPO | Admitting: *Deleted

## 2019-04-03 DIAGNOSIS — I214 Non-ST elevation (NSTEMI) myocardial infarction: Secondary | ICD-10-CM

## 2019-04-03 NOTE — Progress Notes (Signed)
Daily Session Note  Patient Details  Name: Haley Young MRN: [id number] Date of Birth: [birth year] Referring Provider:     Cardiac Rehab from 02/11/2019 in [facility] Cardiac and Pulmonary Rehab  Referring Provider  JD      Encounter Date: 04/03/2019  Check In: Session Check In - 04/03/19 1030      Check-In   Supervising physician immediately available to respond to emergencies  See telemetry face sheet for immediately available ER MD    Location  [facility]-Cardiac & Pulmonary Rehab    Staff Present  Renita Papa, RN BSN;Jessica Luan Pulling, MA, RCEP, CCRP, CCET;Joseph Lena RCP,RRT,BSRT    Virtual Visit  No    Medication changes reported      No    Fall or balance concerns reported     No    Warm-up and Cool-down  Performed on first and last piece of equipment    Resistance Training Performed  Yes    VAD Patient?  No    PAD/SET Patient?  No      Pain Assessment   Currently in Pain?  No/denies          Social History   Tobacco Use  Smoking Status Never Smoker  Smokeless Tobacco Never Used    Goals Met:  Independence with exercise equipment Exercise tolerated well No report of cardiac concerns or symptoms Strength training completed today  Goals Unmet:  Not Applicable  Comments: Pt able to follow exercise prescription today without complaint.  Will continue to monitor for progression.    Dr. Emily Filbert is Medical Director for [place] and [facility].

## 2019-04-05 ENCOUNTER — Other Ambulatory Visit: Payer: Self-pay

## 2019-04-05 DIAGNOSIS — I214 Non-ST elevation (NSTEMI) myocardial infarction: Secondary | ICD-10-CM | POA: Diagnosis not present

## 2019-04-05 NOTE — Progress Notes (Signed)
Daily Session Note  Patient Details  Name: Haley Young MRN: [id number] Date of Birth: 1951/06/10 Referring Provider:     Cardiac Rehab from 02/11/2019 in [facility] Cardiac and Pulmonary Rehab  Referring Provider  JD      Encounter Date: 04/05/2019  Check In: Session Check In - 04/05/19 1038      Check-In   Supervising physician immediately available to respond to emergencies  See telemetry face sheet for immediately available ER MD    Location  [facility]-Cardiac & Pulmonary Rehab    Staff Present  Vida Rigger RN, Vickki Hearing, BA, ACSM CEP, Exercise Physiologist;Jessica [place], MA, RCEP, CCRP, CCET;Joseph Brooks [employer] [place] [employer]    Virtual Visit  No    Medication changes reported      No    Fall or balance concerns reported     No    Warm-up and Cool-down  Performed on first and last piece of equipment    Resistance Training Performed  Yes    VAD Patient?  No    PAD/SET Patient?  No      Pain Assessment   Currently in Pain?  No/denies    Multiple Pain Sites  No          Social History   Tobacco Use  Smoking Status Never Smoker  Smokeless Tobacco Never Used    Goals Met:  Independence with exercise equipment Exercise tolerated well No report of cardiac concerns or symptoms Strength training completed today  Goals Unmet:  Not Applicable  Comments: Pt able to follow exercise prescription today without complaint.  Will continue to monitor for progression.   Dr. Emily Filbert is Medical Director for [place] and [facility].

## 2019-04-08 ENCOUNTER — Encounter: Payer: PPO | Admitting: *Deleted

## 2019-04-08 ENCOUNTER — Other Ambulatory Visit: Payer: Self-pay

## 2019-04-08 DIAGNOSIS — I214 Non-ST elevation (NSTEMI) myocardial infarction: Secondary | ICD-10-CM

## 2019-04-08 NOTE — Progress Notes (Signed)
Daily Session Note  Patient Details  Name: Haley Young MRN: [id number] Date of Birth: 1951/06/24 Referring Provider:     Cardiac Rehab from 02/11/2019 in [facility] Cardiac and Pulmonary Rehab  Referring Provider  JD      Encounter Date: 04/08/2019  Check In: Session Check In - 04/08/19 1105      Check-In   Supervising physician immediately available to respond to emergencies  See telemetry face sheet for immediately available ER MD    Location  [facility]-Cardiac & Pulmonary Rehab    Staff Present  Earlean Shawl, BS, ACSM CEP, Exercise Physiologist;Joseph Hood RCP,RRT,BSRT;Carroll Enterkin, RN, BSN-BC, CCRP    Virtual Visit  No    Medication changes reported      No    Fall or balance concerns reported     No    Warm-up and Cool-down  Performed on first and last piece of equipment    Resistance Training Performed  Yes    VAD Patient?  No    PAD/SET Patient?  No      Pain Assessment   Currently in Pain?  No/denies    Multiple Pain Sites  No          Social History   Tobacco Use  Smoking Status Never Smoker  Smokeless Tobacco Never Used    Goals Met:  Independence with exercise equipment Exercise tolerated well No report of cardiac concerns or symptoms Strength training completed today  Goals Unmet:  Not Applicable  Comments: Pt able to follow exercise prescription today without complaint.  Will continue to monitor for progression.    Dr. Emily Filbert is Medical Director for [place] and [facility].

## 2019-04-10 ENCOUNTER — Encounter: Payer: PPO | Admitting: *Deleted

## 2019-04-10 ENCOUNTER — Other Ambulatory Visit: Payer: Self-pay

## 2019-04-10 DIAGNOSIS — I214 Non-ST elevation (NSTEMI) myocardial infarction: Secondary | ICD-10-CM | POA: Diagnosis not present

## 2019-04-10 NOTE — Progress Notes (Signed)
Daily Session Note  Patient Details  Name: Haley Young MRN: [id number] Date of Birth: [birth year] Referring Provider:     Cardiac Rehab from 02/11/2019 in [facility] Cardiac and Pulmonary Rehab  Referring Provider  JD      Encounter Date: 04/10/2019  Check In: Session Check In - 04/10/19 1034      Check-In   Supervising physician immediately available to respond to emergencies  See telemetry face sheet for immediately available ER MD    Location  [facility]-Cardiac & Pulmonary Rehab    Staff Present  Darel Hong, RN Vickki Hearing, BA, ACSM CEP, Exercise Physiologist;Jessica Luan Pulling, MA, RCEP, CCRP, CCET    Virtual Visit  No    Medication changes reported      No    Fall or balance concerns reported     No    Warm-up and Cool-down  Performed on first and last piece of equipment    Resistance Training Performed  Yes    VAD Patient?  No    PAD/SET Patient?  No      Pain Assessment   Currently in Pain?  No/denies          Social History   Tobacco Use  Smoking Status Never Smoker  Smokeless Tobacco Never Used    Goals Met:  Independence with exercise equipment Exercise tolerated well No report of cardiac concerns or symptoms Strength training completed today  Goals Unmet:  Not Applicable  Comments: Pt able to follow exercise prescription today without complaint.  Will continue to monitor for progression.    Dr. Emily Filbert is Medical Director for [place] and [facility].

## 2019-04-12 ENCOUNTER — Encounter: Payer: PPO | Attending: Internal Medicine

## 2019-04-12 DIAGNOSIS — Z79899 Other long term (current) drug therapy: Secondary | ICD-10-CM | POA: Insufficient documentation

## 2019-04-12 DIAGNOSIS — Z7901 Long term (current) use of anticoagulants: Secondary | ICD-10-CM | POA: Insufficient documentation

## 2019-04-12 DIAGNOSIS — Z79890 Hormone replacement therapy (postmenopausal): Secondary | ICD-10-CM | POA: Insufficient documentation

## 2019-04-12 DIAGNOSIS — Z7982 Long term (current) use of aspirin: Secondary | ICD-10-CM | POA: Insufficient documentation

## 2019-04-12 DIAGNOSIS — E785 Hyperlipidemia, unspecified: Secondary | ICD-10-CM | POA: Insufficient documentation

## 2019-04-12 DIAGNOSIS — F329 Major depressive disorder, single episode, unspecified: Secondary | ICD-10-CM | POA: Insufficient documentation

## 2019-04-12 DIAGNOSIS — I214 Non-ST elevation (NSTEMI) myocardial infarction: Secondary | ICD-10-CM | POA: Insufficient documentation

## 2019-04-12 DIAGNOSIS — E079 Disorder of thyroid, unspecified: Secondary | ICD-10-CM | POA: Insufficient documentation

## 2019-04-12 DIAGNOSIS — Z86010 Personal history of colonic polyps: Secondary | ICD-10-CM | POA: Insufficient documentation

## 2019-04-12 DIAGNOSIS — Z794 Long term (current) use of insulin: Secondary | ICD-10-CM | POA: Insufficient documentation

## 2019-04-12 DIAGNOSIS — E119 Type 2 diabetes mellitus without complications: Secondary | ICD-10-CM | POA: Insufficient documentation

## 2019-04-12 DIAGNOSIS — J45909 Unspecified asthma, uncomplicated: Secondary | ICD-10-CM | POA: Insufficient documentation

## 2019-04-17 ENCOUNTER — Encounter: Payer: Self-pay | Admitting: *Deleted

## 2019-04-17 DIAGNOSIS — I214 Non-ST elevation (NSTEMI) myocardial infarction: Secondary | ICD-10-CM

## 2019-04-17 NOTE — Progress Notes (Signed)
Cardiac Individual Treatment Plan  Patient Details  Name: Haley Young MRN: [id number] Date of Birth: 1950/10/13 Referring Provider:     Cardiac Rehab from 02/11/2019 in [facility] Cardiac and Pulmonary Rehab  Referring Provider  JD      Initial Encounter Date:    Cardiac Rehab from 02/11/2019 in [facility] Cardiac and Pulmonary Rehab  Date  02/11/19      Visit Diagnosis: NSTEMI (non-ST elevation myocardial infarction) [facility])  Patient's Home Medications on Admission:  Current Outpatient Medications:  .  apixaban (ELIQUIS) 5 MG TABS tablet, Take 1 tablet (5 mg total) by mouth 2 (two) times daily., Disp: 60 tablet, Rfl: 11 .  aspirin 81 MG chewable tablet, Chew 81 mg by mouth daily., Disp: , Rfl:  .  atorvastatin (LIPITOR) 40 MG tablet, Take 40 mg by mouth daily., Disp: , Rfl:  .  Fludrocortisone Acetate (FLORINEF PO), Take 0.1 mg by mouth daily., Disp: , Rfl:  .  FLUoxetine (PROZAC) 10 MG tablet, Take 1 tablet by mouth once daily, Disp: 90 tablet, Rfl: 1 .  furosemide (LASIX) 20 MG tablet, Take 20 mg by mouth daily. , Disp: , Rfl:  .  glipiZIDE (GLUCOTROL) 10 MG tablet, Take 1 tablet (10 mg total) by mouth 2 (two) times daily before a meal., Disp: 180 tablet, Rfl: 3 .  glucose blood (ONE TOUCH ULTRA TEST) test strip, USE ONE STRIP TO CHECK GLUCOSE THREE TIMES DAILY AS DIRECTED E11.65, Disp: 200 each, Rfl: 2 .  hydrocortisone (CORTEF) 10 MG tablet, Take 5 mg by mouth 2 (two) times daily. 10 mg QAM, 5 mg lunch, 5 PM, Disp: , Rfl:  .  hydrocortisone sodium succinate (SOLU-CORTEF) 100 MG SOLR injection, Use 1 injection for adrenal crisis, Disp: , Rfl:  .  Insulin Glargine (LANTUS SOLOSTAR) 100 UNIT/ML Solostar Pen, Inject 15 Units into the skin daily. ADJUST UPWARD AS NEEDED EVERY 3 DAYS, Disp: 5 pen, Rfl: PRN .  Insulin Pen Needle 32G X 4 MM MISC, Used to give daily insulin injections., Disp: 100 each, Rfl: 12 .  levothyroxine (SYNTHROID) 112 MCG tablet, Take 1 tablet (112 mcg total) by mouth daily.,  Disp: 90 tablet, Rfl: 0 .  metoprolol succinate (TOPROL-XL) 25 MG 24 hr tablet, Take by mouth., Disp: , Rfl:  .  mirtazapine (REMERON) 7.5 MG tablet, TAKE 1 TABLET BY MOUTH AT BEDTIME, Disp: 90 tablet, Rfl: 1 .  Potassium Chloride ER 20 MEQ TBCR, Take 1 tablet by mouth once daily, Disp: 30 tablet, Rfl: 2 .  Semaglutide, 1 MG/DOSE, (OZEMPIC, 1 MG/DOSE,) 2 MG/1.5ML SOPN, Inject into the skin. Inject into stomach 1 time weekly, Disp: , Rfl:  .  traZODone (DESYREL) 150 MG tablet, TAKE 1 TABLET BY MOUTH AT BEDTIME, Disp: 90 tablet, Rfl: 1  Past Medical History: Past Medical History:  Diagnosis Date  . Asthma   . Chicken pox   . Depression   . Diabetes mellitus   . Frequent UTI   . GERD (gastroesophageal reflux disease)   . Heart disease   . Heart murmur   . 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 Hx of colonic polyps   . Hyperhidrosis    especially with hot flashes    . Hyperlipidemia   . Lipoma    R axilla   . MRSA cellulitis    surgical wound infection  . Osteoporosis   . Pyelocystitis    as a child   . Thyroid disease   .  Urinary incontinence     Tobacco Use: Social History   Tobacco Use  Smoking Status Never Smoker  Smokeless Tobacco Never Used    Labs: Recent Review Flowsheet Data    Labs for ITP Cardiac and Pulmonary Rehab Latest Ref Rng & Units 04/14/2016 05/11/2017 01/17/2018 06/29/2018 11/27/2018   Cholestrol 0 - 200 mg/dL 239(H) - - - -   LDLCALC 0 - 99 mg/dL 149(H) - - - -   LDLDIRECT mg/dL 165.0 - - - -   HDL >39.00 mg/dL 57.90 - - - -   Trlycerides 0.0 - 149.0 mg/dL 162.0(H) - - - -   Hemoglobin A1c 4.6 - 6.5 % - 7.0(H) 8.4(H) 9.5 10.4(H)       Exercise Target Goals: Exercise Program Goal: Individual exercise prescription set using results from initial 6 min walk test and THRR while considering  patient's activity barriers and safety.   Exercise Prescription Goal: Initial exercise prescription builds to 30-45 minutes a day of  aerobic activity, 2-3 days per week.  Home exercise guidelines will be given to patient during program as part of exercise prescription that the participant will acknowledge.  Activity Barriers & Risk Stratification: Activity Barriers & Cardiac Risk Stratification - 02/07/19 1218      Activity Barriers & Cardiac Risk Stratification   Activity Barriers  Shortness of Breath   Has Addisons disease and SOB has since diagnosed   Cardiac Risk Stratification  High       6 Minute Walk: 6 Minute Walk    Row Name 02/11/19 1229         6 Minute Walk   Phase  Initial     Distance  1003 feet     Walk Time  6 minutes     # of Rest Breaks  0     MPH  1.89     METS  2.05     RPE  11     VO2 Peak  7.18     Resting HR  72 bpm     Resting BP  102/60     Max Ex. HR  85 bpm     Max Ex. BP  122/74     2 Minute Post BP  102/64        Oxygen Initial Assessment:   Oxygen Re-Evaluation:   Oxygen Discharge (Final Oxygen Re-Evaluation):   Initial Exercise Prescription: Initial Exercise Prescription - 02/11/19 1200      Date of Initial Exercise RX and Referring Provider   Date  02/11/19    Referring Provider  JD      NuStep   Level  2    SPM  60    Minutes  15    METs  2      Recumbant Elliptical   Level  1    RPM  30    Watts  20    Minutes  15    METs  2      REL-XR   Level  1    Watts  20    Speed  50    Minutes  15    METs  2      T5 Nustep   Level  1    SPM  50    Minutes  15    METs  2      Prescription Details   Frequency (times per week)  3    Duration  Progress to 30 minutes of continuous aerobic without  signs/symptoms of physical distress      Intensity   THRR 40-80% of Max Heartrate  103-134    Ratings of Perceived Exertion  11-15    Perceived Dyspnea  0-4      Progression   Progression  Continue progressive overload as per policy without signs/symptoms or physical distress.      Resistance Training   Training Prescription  Yes    Weight  3     Reps  10-15       Perform Capillary Blood Glucose checks as needed.  Exercise Prescription Changes: Exercise Prescription Changes    Row Name 02/11/19 1200 02/28/19 1400 04/03/19 1300         Response to Exercise   Blood Pressure (Admit)  102/60  100/62  118/60     Blood Pressure (Exercise)  122/74  142/80  128/70     Blood Pressure (Exit)  102/64  102/62  122/70     Heart Rate (Admit)  72 bpm  88 bpm  53 bpm     Heart Rate (Exercise)  85 bpm  129 bpm  89 bpm     Heart Rate (Exit)  63 bpm  117 bpm  61 bpm     Rating of Perceived Exertion (Exercise)  _0 Symptoms  no  none  none     Duration  Progress to 30 minutes of  aerobic without signs/symptoms of physical distress  Continue with 30 min of aerobic exercise without signs/symptoms of physical distress.  Continue with 30 min of aerobic exercise without signs/symptoms of physical distress.     Intensity  THRR unchanged  THRR unchanged  THRR unchanged       Progression   Progression  -  Continue to progress workloads to maintain intensity without signs/symptoms of physical distress.  Continue to progress workloads to maintain intensity without signs/symptoms of physical distress.     Average METs  -  2.07  2.5       Resistance Training   Training Prescription  -  Yes  Yes     Weight  -  3 lbs  3 lb     Reps  -  10-15  10-15       Interval Training   Interval Training  -  No  No       NuStep   Level  -  3  -     Minutes  -  15  -     METs  -  2.7  -       Recumbant Elliptical   Level  -  1  -     Minutes  -  15  -     METs  -  1.4  -       REL-XR   Level  -  1  -     Minutes  -  15  -     METs  -  2.2  -       T5 Nustep   Level  -  1  -     Minutes  -  15  -     METs  -  2  -        Exercise Comments: Exercise Comments    Row Name 02/13/19 1034           Exercise Comments  First full day of exercise!  Patient was oriented to gym and  equipment including functions, settings, policies, and  procedures.  Patient's individual exercise prescription and treatment plan were reviewed.  All starting workloads were established based on the results of the 6 minute walk test done at initial orientation visit.  The plan for exercise progression was also introduced and progression will be customized based on patient's performance and goals.          Exercise Goals and Review: Exercise Goals    Row Name 02/11/19 1241             Exercise Goals   Increase Physical Activity  Yes       Intervention  Provide advice, education, support and counseling about physical activity/exercise needs.;Develop an individualized exercise prescription for aerobic and resistive training based on initial evaluation findings, risk stratification, comorbidities and participant's personal goals.       Expected Outcomes  Short Term: Attend rehab on a regular basis to increase amount of physical activity.;Long Term: Add in home exercise to make exercise part of routine and to increase amount of physical activity.;Long Term: Exercising regularly at least 3-5 days a week.       Increase Strength and Stamina  Yes       Intervention  Provide advice, education, support and counseling about physical activity/exercise needs.;Develop an individualized exercise prescription for aerobic and resistive training based on initial evaluation findings, risk stratification, comorbidities and participant's personal goals.       Expected Outcomes  Short Term: Increase workloads from initial exercise prescription for resistance, speed, and METs.;Short Term: Perform resistance training exercises routinely during rehab and add in resistance training at home;Long Term: Improve cardiorespiratory fitness, muscular endurance and strength as measured by increased METs and functional capacity (6MWT)       Able to understand and use rate of perceived exertion (RPE) scale  Yes       Intervention  Provide education and explanation on how to use RPE  scale       Expected Outcomes  Short Term: Able to use RPE daily in rehab to express subjective intensity level;Long Term:  Able to use RPE to guide intensity level when exercising independently       Able to understand and use Dyspnea scale  Yes       Intervention  Provide education and explanation on how to use Dyspnea scale       Expected Outcomes  Short Term: Able to use Dyspnea scale daily in rehab to express subjective sense of shortness of breath during exertion;Long Term: Able to use Dyspnea scale to guide intensity level when exercising independently       Knowledge and understanding of Target Heart Rate Range (THRR)  Yes       Intervention  Provide education and explanation of THRR including how the numbers were predicted and where they are located for reference       Expected Outcomes  Short Term: Able to state/look up THRR;Short Term: Able to use daily as guideline for intensity in rehab;Long Term: Able to use THRR to govern intensity when exercising independently       Able to check pulse independently  Yes       Intervention  Provide education and demonstration on how to check pulse in carotid and radial arteries.;Review the importance of being able to check your own pulse for safety during independent exercise       Expected Outcomes  Short Term: Able to explain why pulse checking is important during independent exercise  Understanding of Exercise Prescription  Yes       Intervention  Provide education, explanation, and written materials on patient's individual exercise prescription       Expected Outcomes  Short Term: Able to explain program exercise prescription;Long Term: Able to explain home exercise prescription to exercise independently          Exercise Goals Re-Evaluation : Exercise Goals Re-Evaluation    Row Name 02/13/19 1035 02/28/19 1404 03/05/19 1640 03/11/19 1101 03/13/19 1115     Exercise Goal Re-Evaluation   Exercise Goals Review  Increase Physical  Activity;Increase Strength and Stamina;Able to understand and use rate of perceived exertion (RPE) scale;Knowledge and understanding of Target Heart Rate Range (THRR);Understanding of Exercise Prescription  Increase Physical Activity;Increase Strength and Stamina;Understanding of Exercise Prescription  -  Increase Physical Activity;Increase Strength and Stamina;Able to understand and use rate of perceived exertion (RPE) scale;Able to check pulse independently;Understanding of Exercise Prescription  Increase Physical Activity;Increase Strength and Stamina;Understanding of Exercise Prescription   Comments  Reviewed RPE scale, THR and program prescription with pt today.  Pt voiced understanding and was given a copy of goals to take home.  Rei is off to a good start in rehab. She is already up to level 3 on the NuStep.  We will continue to monitor her progress and review home exercise guidelines.  out since last review  Reviewed home exercise with pt today.  Pt plans to walk and use videos for exercise.  Reviewed THR, pulse, RPE, sign and symptoms, NTG use, and when to call 911 or MD.  Also discussed weather considerations and indoor options.  Pt voiced understanding.  Teaira is doing well in rehab.  She is doing some of her exercise at home as she has been caring for her husband after a fall.  She is feeling stronger overall.   Expected Outcomes  Short: Use RPE daily to regulate intensity. Long: Follow program prescription in THR.  Short: Review home exercise.  Long: Continue to follow program prescription.  -  Short - add 1 day of exercise per week at home Long - exercise on her own  Short: Exercise more at home.  Long: Continue to increase strength and stamina.   [place] Name 03/25/19 1130 04/03/19 1320           Exercise Goal Re-Evaluation   Exercise Goals Review  Increase Physical Activity;Increase Strength and Stamina  Increase Physical Activity;Increase Strength and Stamina;Able to understand and use rate of  perceived exertion (RPE) scale;Knowledge and understanding of Target Heart Rate Range (THRR);Able to check pulse independently;Understanding of Exercise Prescription      Comments  Patient states that she does not exercise at home. She states that she is just lazy. Informed her that is is important for her to exercise after she finishes the program. She states her husband got hurt at work and has to take care of him at the moment. Patient has a good street that she can walk on and is going to add walking one day a week.  -      Expected Outcomes  Short: walk one day a week outside. Long: maintain an exercise routine after [facility].  -         Discharge Exercise Prescription (Final Exercise Prescription Changes): Exercise Prescription Changes - 04/03/19 1300      Response to Exercise   Blood Pressure (Admit)  118/60    Blood Pressure (Exercise)  128/70    Blood Pressure (Exit)  122/70  Heart Rate (Admit)  53 bpm    Heart Rate (Exercise)  89 bpm    Heart Rate (Exit)  61 bpm    Rating of Perceived Exertion (Exercise)  12    Symptoms  none    Duration  Continue with 30 min of aerobic exercise without signs/symptoms of physical distress.    Intensity  THRR unchanged      Progression   Progression  Continue to progress workloads to maintain intensity without signs/symptoms of physical distress.    Average METs  2.5      Resistance Training   Training Prescription  Yes    Weight  3 lb    Reps  10-15      Interval Training   Interval Training  No       Nutrition:  Target Goals: Understanding of nutrition guidelines, daily intake of sodium <1589m, cholesterol <2065m calories 30% from fat and 7% or less from saturated fats, daily to have 5 or more servings of fruits and vegetables.  Biometrics: Pre Biometrics - 02/11/19 1230      Pre Biometrics   Height  _0  (1.575 m)    Weight  140 lb 6.4 oz (63.7 kg)    BMI (Calculated)  25.67    Single Leg Stand  20.02 seconds         Nutrition Therapy Plan and Nutrition Goals: Nutrition Therapy & Goals - 02/11/19 1155      Nutrition Therapy   Diet  HH, DM diet    Protein (specify units)  50g    Fiber  25 grams    Whole Grain Foods  3 servings    Saturated Fats  12 max. grams    Fruits and Vegetables  5 servings/day    Sodium  1.5 grams      Personal Nutrition Goals   Nutrition Goal  ST: try to add soluble fiber and reduce fatty food intake to manage diarrhea caused by medications LT: gain strength    Comments  Pt with Addisons disease, pt reports trying to eat enough sodium. Discussed HH and DM eating and diarrhea managemnet as she reports her medication can trigger diarrhea. Pt reports eating bagel with cream cheese (whole wheat), coffee with 2% milk and sweetener. tomato sandwich on whole wheat for lunch, and meat with 2 vegetables for dinner. (pork chops or fish (fried), chicken thighs without skin roasted)(corn, beans, peas, potatoes) pt reports also having fruit like melons (all different kinds). Pt does not tolerate bananas well.      Intervention Plan   Intervention  Prescribe, educate and counsel regarding individualized specific dietary modifications aiming towards targeted core components such as weight, hypertension, lipid management, diabetes, heart failure and other comorbidities.;Nutrition handout(s) given to patient.    Expected Outcomes  Short Term Goal: Understand basic principles of dietary content, such as calories, fat, sodium, cholesterol and nutrients.;Short Term Goal: A plan has been developed with personal nutrition goals set during dietitian appointment.;Long Term Goal: Adherence to prescribed nutrition plan.       Nutrition Assessments:   Nutrition Goals Re-Evaluation: Nutrition Goals Re-Evaluation    [place] 03/11/19 1132 04/03/19 1110           Goals   Nutrition Goal  ST: try to add soluble fiber and reduce fatty food intake to manage diarrhea caused by medications LT: gain  strength  ST: Try to add snacks and eat frequent meals, pair meals with protein, fat, and fiber LT: gain strength  Comment  Pt reports still has bad diarrhea since we last spoke a month ago, again encouraged her to speak to her doctor (pt thinks its her medicaitons), pt stays away from insoluble fiber due to diarrhea exacerbation - encouraged simple foods that her body can tolerate to help symptoms. Expresssed concern with dehydration or malnutrition with continued diarrhea.  Discussed new insulin and nutrition, hypo and hyper glycemia. Pt now on long-lasting insulin. No more diarrhea due to medications.      Expected Outcome  Pt will contact doctor regarding diarrhea and diahhrea will subside.  ST: Try to add snacks and eat frequent meals, pair meals with protein, fat, and fiber LT: gain strength         Nutrition Goals Discharge (Final Nutrition Goals Re-Evaluation): Nutrition Goals Re-Evaluation - 04/03/19 1110      Goals   Nutrition Goal  ST: Try to add snacks and eat frequent meals, pair meals with protein, fat, and fiber LT: gain strength    Comment  Discussed new insulin and nutrition, hypo and hyper glycemia. Pt now on long-lasting insulin. No more diarrhea due to medications.    Expected Outcome  ST: Try to add snacks and eat frequent meals, pair meals with protein, fat, and fiber LT: gain strength       Psychosocial: Target Goals: Acknowledge presence or absence of significant depression and/or stress, maximize coping skills, provide positive support system. Participant is able to verbalize types and ability to use techniques and skills needed for reducing stress and depression.   Initial Review & Psychosocial Screening: Initial Psych Review & Screening - 02/07/19 1220      Initial Review   Current issues with  Current Stress Concerns    Source of Stress Concerns  Retirement/disability;Family    Comments  Always worked and had to quit work. Not having anything to do.  COVID 19  restricting ability to socialize.   Children making sure they are carede for if something happens to her.      Family Dynamics   Good Support System?  Yes   Husband, Family, neighbors/friends and church     Barriers   Psychosocial barriers to participate in program  There are no identifiable barriers or psychosocial needs.;The patient should benefit from training in stress management and relaxation.      Screening Interventions   Interventions  Encouraged to exercise;Provide feedback about the scores to participant;To provide support and resources with identified psychosocial needs    Expected Outcomes  Short Term goal: Utilizing psychosocial counselor, staff and physician to assist with identification of specific Stressors or current issues interfering with healing process. Setting desired goal for each stressor or current issue identified.;Long Term Goal: Stressors or current issues are controlled or eliminated.;Short Term goal: Identification and review with participant of any Quality of Life or Depression concerns found by scoring the questionnaire.;Long Term goal: The participant improves quality of Life and PHQ9 Scores as seen by post scores and/or verbalization of changes       Quality of Life Scores:   Scores of 19 and below usually indicate a poorer quality of life in these areas.  A difference of  2-3 points is a clinically meaningful difference.  A difference of 2-3 points in the total score of the Quality of Life Index has been associated with significant improvement in overall quality of life, self-image, physical symptoms, and general health in studies assessing change in quality of life.  PHQ-9: Recent Review Flowsheet Data  Depression screen [facility] 2/9 03/29/2019 12/10/2018 07/12/2018 07/10/2017 07/08/2016   Decreased Interest 0 0 1 0 0   Down, Depressed, Hopeless 1 1 0 0 0   PHQ - 2 Score _0 0 0     Interpretation of Total Score  Total Score Depression Severity:  1-4 =  Minimal depression, 5-9 = Mild depression, 10-14 = Moderate depression, 15-19 = Moderately severe depression, 20-27 = Severe depression   Psychosocial Evaluation and Intervention:   Psychosocial Re-Evaluation: Psychosocial Re-Evaluation    [place] Name 03/11/19 1105 03/13/19 1116 03/25/19 1133         Psychosocial Re-Evaluation   Current issues with  Current Stress Concerns  Current Stress Concerns  Current Stress Concerns     Comments  Janie spoke with her Dr about stress - she has been really upset since she stopped breathing.  Her Dr suggested counseling.  Gaelle is doing okay.  Her husband fell and broke his knee cap and she has been caring for him.  He is making her crazy.  Her doctor thinks she may PTSD.  She talked with someone this morning and was given a reference for someone to meet with.,  Her husband fell at work and she is taking care of him. She wants to travel but her children have special needs and is not able to. She has to take her son to work since he cannot drive. Sometimes she wonders how and why she is still alive after her code blue. She is mostly positive and is trying to stay active.     Expected Outcomes  Short - staff will have counselor call Vivianne Master will meet with counselor as needed  Short: Reach out to reference counselor. Long: Continue to seek self care.  Short: attend [facility] regularly. Long: Maintain exercise to decrease stress levels.     Interventions  Encouraged to attend Cardiac Rehabilitation for the exercise  Encouraged to attend Cardiac Rehabilitation for the exercise  Encouraged to attend Cardiac Rehabilitation for the exercise     Continue Psychosocial Services   -  Follow up required by staff  Follow up required by staff        Psychosocial Discharge (Final Psychosocial Re-Evaluation): Psychosocial Re-Evaluation - 03/25/19 1133      Psychosocial Re-Evaluation   Current issues with  Current Stress Concerns    Comments  Her husband fell at work and  she is taking care of him. She wants to travel but her children have special needs and is not able to. She has to take her son to work since he cannot drive. Sometimes she wonders how and why she is still alive after her code blue. She is mostly positive and is trying to stay active.    Expected Outcomes  Short: attend [facility] regularly. Long: Maintain exercise to decrease stress levels.    Interventions  Encouraged to attend Cardiac Rehabilitation for the exercise    Continue Psychosocial Services   Follow up required by staff       Vocational Rehabilitation: Provide vocational rehab assistance to qualifying candidates.   Vocational Rehab Evaluation & Intervention: Vocational Rehab - 02/07/19 1224      Initial Vocational Rehab Evaluation & Intervention   Assessment shows need for Vocational Rehabilitation  No       Education: Education Goals: Education classes will be provided on a variety of topics geared toward better understanding of heart health and risk factor modification. Participant will state understanding/return demonstration of  topics presented as noted by education test scores.  Learning Barriers/Preferences: Learning Barriers/Preferences - 02/07/19 1224      Learning Barriers/Preferences   Learning Barriers  None    Learning Preferences  None       Education Topics:  AED/CPR: - Group verbal and written instruction with the use of models to demonstrate the basic use of the AED with the basic ABC's of resuscitation.   General Nutrition Guidelines/Fats and Fiber: -Group instruction provided by verbal, written material, models and posters to present the general guidelines for heart healthy nutrition. Gives an explanation and review of dietary fats and fiber.   Controlling Sodium/Reading Food Labels: -Group verbal and written material supporting the discussion of sodium use in heart healthy nutrition. Review and explanation with models, verbal and written  materials for utilization of the food label.   Exercise Physiology & General Exercise Guidelines: - Group verbal and written instruction with models to review the exercise physiology of the cardiovascular system and associated critical values. Provides general exercise guidelines with specific guidelines to those with heart or lung disease.    Aerobic Exercise & Resistance Training: - Gives group verbal and written instruction on the various components of exercise. Focuses on aerobic and resistive training programs and the benefits of this training and how to safely progress through these programs..   Flexibility, Balance, Mind/Body Relaxation: Provides group verbal/written instruction on the benefits of flexibility and balance training, including mind/body exercise modes such as yoga, pilates and tai chi.  Demonstration and skill practice provided.   Stress and Anxiety: - Provides group verbal and written instruction about the health risks of elevated stress and causes of high stress.  Discuss the correlation between heart/lung disease and anxiety and treatment options. Review healthy ways to manage with stress and anxiety.   Depression: - Provides group verbal and written instruction on the correlation between heart/lung disease and depressed mood, treatment options, and the stigmas associated with seeking treatment.   Anatomy & Physiology of the Heart: - Group verbal and written instruction and models provide basic cardiac anatomy and physiology, with the coronary electrical and arterial systems. Review of Valvular disease and Heart Failure   Cardiac Procedures: - Group verbal and written instruction to review commonly prescribed medications for heart disease. Reviews the medication, class of the drug, and side effects. Includes the steps to properly store meds and maintain the prescription regimen. (beta blockers and nitrates)   Cardiac Medications I: - Group verbal and written  instruction to review commonly prescribed medications for heart disease. Reviews the medication, class of the drug, and side effects. Includes the steps to properly store meds and maintain the prescription regimen.   Cardiac Medications II: -Group verbal and written instruction to review commonly prescribed medications for heart disease. Reviews the medication, class of the drug, and side effects. (all other drug classes)    Go Sex-Intimacy & Heart Disease, Get SMART - Goal Setting: - Group verbal and written instruction through game format to discuss heart disease and the return to sexual intimacy. Provides group verbal and written material to discuss and apply goal setting through the application of the S.M.A.R.T. Method.   Other Matters of the Heart: - Provides group verbal, written materials and models to describe Stable Angina and Peripheral Artery. Includes description of the disease process and treatment options available to the cardiac patient.   Exercise & Equipment Safety: - Individual verbal instruction and demonstration of equipment use and safety with use of the equipment.  Cardiac Rehab from 02/11/2019 in [facility] Cardiac and Pulmonary Rehab  Date  02/11/19  Educator  [place]  Instruction Review Code  1- Verbalizes Understanding      Infection Prevention: - Provides verbal and written material to individual with discussion of infection control including proper hand washing and proper equipment cleaning during exercise session.   Cardiac Rehab from 02/11/2019 in [facility] Cardiac and Pulmonary Rehab  Date  02/11/19  Educator  [place]  Instruction Review Code  1- Verbalizes Understanding      Falls Prevention: - Provides verbal and written material to individual with discussion of falls prevention and safety.   Cardiac Rehab from 02/11/2019 in [facility] Cardiac and Pulmonary Rehab  Date  02/11/19  Educator  [place]  Instruction Review Code  1- Verbalizes Understanding      Diabetes: -  Individual verbal and written instruction to review signs/symptoms of diabetes, desired ranges of glucose level fasting, after meals and with exercise. Acknowledge that pre and post exercise glucose checks will be done for 3 sessions at entry of program.   Cardiac Rehab from 02/11/2019 in [facility] Cardiac and Pulmonary Rehab  Date  02/11/19  Educator  [place]  Instruction Review Code  1- Verbalizes Understanding      Know Your Numbers and Risk Factors: -Group verbal and written instruction about important numbers in your health.  Discussion of what are risk factors and how they play a role in the disease process.  Review of Cholesterol, Blood Pressure, Diabetes, and BMI and the role they play in your overall health.   Sleep Hygiene: -Provides group verbal and written instruction about how sleep can affect your health.  Define sleep hygiene, discuss sleep cycles and impact of sleep habits. Review good sleep hygiene tips.    Other: -Provides group and verbal instruction on various topics (see comments)   Knowledge Questionnaire Score: Knowledge Questionnaire Score - 02/11/19 0909      Knowledge Questionnaire Score   Pre Score  18/26  Missed Nutrition, Angina and Exercise questions       Core Components/Risk Factors/Patient Goals at Admission: Personal Goals and Risk Factors at Admission - 02/07/19 1225      Core Components/Risk Factors/Patient Goals on Admission    Weight Management  Yes;Weight Maintenance    Intervention  Weight Management: Develop a combined nutrition and exercise program designed to reach desired caloric intake, while maintaining appropriate intake of nutrient and fiber, sodium and fats, and appropriate energy expenditure required for the weight goal.;Weight Management: Provide education and appropriate resources to help participant work on and attain dietary goals.    Expected Outcomes  Short Term: Continue to assess and modify interventions until short term weight is  achieved;Long Term: Adherence to nutrition and physical activity/exercise program aimed toward attainment of established weight goal    Diabetes  Yes    Intervention  Provide education about signs/symptoms and action to take for hypo/hyperglycemia.;Provide education about proper nutrition, including hydration, and aerobic/resistive exercise prescription along with prescribed medications to achieve blood glucose in normal ranges: Fasting glucose 65-99 mg/dL    Expected Outcomes  Short Term: Participant verbalizes understanding of the signs/symptoms and immediate care of hyper/hypoglycemia, proper foot care and importance of medication, aerobic/resistive exercise and nutrition plan for blood glucose control.;Long Term: Attainment of HbA1C < 7%.    Hypertension  Yes    Intervention  Provide education on lifestyle modifcations including regular physical activity/exercise, weight management, moderate sodium restriction and increased consumption of fresh fruit, vegetables, and low  fat dairy, alcohol moderation, and smoking cessation.;Monitor prescription use compliance.    Expected Outcomes  Short Term: Continued assessment and intervention until BP is < 140/71m HG in hypertensive participants. < 130/886mHG in hypertensive participants with diabetes, heart failure or chronic kidney disease.;Long Term: Maintenance of blood pressure at goal levels.    Lipids  Yes    Intervention  Provide education and support for participant on nutrition & aerobic/resistive exercise along with prescribed medications to achieve LDL <7018mHDL >22m71m  Expected Outcomes  Short Term: Participant states understanding of desired cholesterol values and is compliant with medications prescribed. Participant is following exercise prescription and nutrition guidelines.;Long Term: Cholesterol controlled with medications as prescribed, with individualized exercise RX and with personalized nutrition plan. Value goals: LDL < 70mg75mL > 40  mg.       Core Components/Risk Factors/Patient Goals Review:  Goals and Risk Factor Review    Row Name 03/11/19 1102 03/11/19 1110 03/25/19 1136         Core Components/Risk Factors/Patient Goals Review   Personal Goals Review  Weight Management/Obesity;Hypertension  -  Weight Management/Obesity;Hypertension;Lipids;Diabetes     Review  Kays Dr added metoprolol to her medications - her HR got high last week.  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  Kays Dr added metoprolol to her medications - her HR got high last week.  She has a machine at home to send her info to manage pacemaker.She checks BG at home - it runs around 155 - her Dr is ok with it being up to 180 due to taking steroids for Addisons disease  Clatie hBetsabebeen maintaing her weight and is doing well. She is taking her medications regularly and is managing her diabetes. She is checking her sugar first thing every morning. Nusaiba iGreenleighot checking her blood pressure at home. She has a machine but does not check it. Her oxygen is good she checks it at home and is always in the mid 90's. Her blood pressure in the program has been 110/60s.     Expected Outcomes  -  Short - keep checking BG at home and keep in close contact with your Dr/PA Long - keep BG and BP withon limits set by Dr  Short:Graduate from cardiac rehab. Long: maintain exercise and a healthy lifestyle post rehab.        Core Components/Risk Factors/Patient Goals at Discharge (Final Review):  Goals and Risk Factor Review - 03/25/19 1136      Core Components/Risk Factors/Patient Goals Review   Personal Goals Review  Weight Management/Obesity;Hypertension;Lipids;Diabetes    Review  Milyn hKippybeen maintaing her weight and is doing well. She is taking her medications regularly and is managing her diabetes. She is checking her sugar first thing every morning. Nautia iMaryanneot checking her blood pressure at home. She has a machine but does not check it. Her oxygen is good  she checks it at home and is always in the mid 90's. Her blood pressure in the program has been 110/60s.    Expected Outcomes  Short:Graduate from cardiac rehab. Long: maintain exercise and a healthy lifestyle post rehab.       ITP Comments: ITP Comments    Row Name 02/07/19 1217 02/13/19 1035 02/20/19 0619 03/06/19 1125 03/20/19 0628   ITP Comments  Orientation completed virtually today   Appointment on Mon 8/3  ro EP and RD evals and gym orientation Documentation of diagnosis can  be found in [facility] 08/13/18  First full day of exercise!  Patient was oriented to gym and equipment including functions, settings, policies, and procedures.  Patient's individual exercise prescription and treatment plan were reviewed.  All starting workloads were established based on the results of the 6 minute walk test done at initial orientation visit.  The plan for exercise progression was also introduced and progression will be customized based on patient's performance and goals.  30 Day Review Completed today. Continue with ITP unless changed by Medical Director review.  New to program  Called to check on pt.  She was cleared by cardiology to return to rehab on 8/18 but she has not been back since then.  Left message.  30 Day review. Continue with ITP unless directed changes per Medical Director review.   Reserve Name 03/29/19 1058 04/17/19 1124         ITP Comments  Pt stated she will be starting insulin next week. Will be checking BG before exercise.  30 day review completed. ITP sent to Dr. Emily Filbert, Medical Director of Cardiac and Pulmonary Rehab. Continue with ITP unless changes are made by physician.  Department closed starting 10/2 until further notice by infection prevention and Health at Work teams for [place].         Comments: 30 day review

## 2019-04-24 ENCOUNTER — Other Ambulatory Visit: Payer: Self-pay

## 2019-04-24 ENCOUNTER — Ambulatory Visit: Payer: PPO | Admitting: Internal Medicine

## 2019-04-24 ENCOUNTER — Encounter: Payer: PPO | Admitting: *Deleted

## 2019-04-24 DIAGNOSIS — E079 Disorder of thyroid, unspecified: Secondary | ICD-10-CM | POA: Diagnosis not present

## 2019-04-24 DIAGNOSIS — Z794 Long term (current) use of insulin: Secondary | ICD-10-CM | POA: Diagnosis not present

## 2019-04-24 DIAGNOSIS — J45909 Unspecified asthma, uncomplicated: Secondary | ICD-10-CM | POA: Diagnosis not present

## 2019-04-24 DIAGNOSIS — E785 Hyperlipidemia, unspecified: Secondary | ICD-10-CM | POA: Diagnosis not present

## 2019-04-24 DIAGNOSIS — Z7901 Long term (current) use of anticoagulants: Secondary | ICD-10-CM | POA: Diagnosis not present

## 2019-04-24 DIAGNOSIS — I214 Non-ST elevation (NSTEMI) myocardial infarction: Secondary | ICD-10-CM | POA: Diagnosis not present

## 2019-04-24 DIAGNOSIS — Z86010 Personal history of colonic polyps: Secondary | ICD-10-CM | POA: Diagnosis not present

## 2019-04-24 DIAGNOSIS — F329 Major depressive disorder, single episode, unspecified: Secondary | ICD-10-CM | POA: Diagnosis not present

## 2019-04-24 DIAGNOSIS — Z79899 Other long term (current) drug therapy: Secondary | ICD-10-CM | POA: Diagnosis not present

## 2019-04-24 DIAGNOSIS — E119 Type 2 diabetes mellitus without complications: Secondary | ICD-10-CM | POA: Diagnosis not present

## 2019-04-24 DIAGNOSIS — Z79890 Hormone replacement therapy (postmenopausal): Secondary | ICD-10-CM | POA: Diagnosis not present

## 2019-04-24 DIAGNOSIS — Z7982 Long term (current) use of aspirin: Secondary | ICD-10-CM | POA: Diagnosis not present

## 2019-04-24 NOTE — Progress Notes (Signed)
Daily Session Note  Patient Details  Name: Haley Young MRN: [id number] Date of Birth: 02/06/1951 Referring Provider:     Cardiac Rehab from 02/11/2019 in [facility] Cardiac and Pulmonary Rehab  Referring Provider  JD      Encounter Date: 04/24/2019  Check In: Session Check In - 04/24/19 1040      Check-In   Supervising physician immediately available to respond to emergencies  See telemetry face sheet for immediately available ER MD    Location  [facility]-Cardiac & Pulmonary Rehab    Staff Present  Alberteen Sam, MA, RCEP, CCRP, CCET;Joseph Hood RCP,RRT,BSRT;Carroll Enterkin, RN, [employer], CCRP    Virtual Visit  No    Medication changes reported      No    Fall or balance concerns reported     No    Warm-up and Cool-down  Performed on first and last piece of equipment    Resistance Training Performed  Yes    VAD Patient?  No    PAD/SET Patient?  No      Pain Assessment   Currently in Pain?  No/denies          Social History   Tobacco Use  Smoking Status Never Smoker  Smokeless Tobacco Never Used    Goals Met:  Independence with exercise equipment Exercise tolerated well Personal goals reviewed No report of cardiac concerns or symptoms Strength training completed today  Goals Unmet:  Not Applicable  Comments: Pt able to follow exercise prescription today without complaint.  Will continue to monitor for progression.    Dr. Emily Filbert is Medical Director for [place] and [facility].

## 2019-04-26 ENCOUNTER — Other Ambulatory Visit: Payer: Self-pay

## 2019-04-26 ENCOUNTER — Encounter: Payer: PPO | Admitting: *Deleted

## 2019-04-26 DIAGNOSIS — I214 Non-ST elevation (NSTEMI) myocardial infarction: Secondary | ICD-10-CM | POA: Diagnosis not present

## 2019-04-26 NOTE — Progress Notes (Signed)
Daily Session Note  Patient Details  Name: KALLEE NAM MRN: [id number] Date of Birth: Jun 29, 1951 Referring Provider:     Cardiac Rehab from 02/11/2019 in [facility] Cardiac and Pulmonary Rehab  Referring Provider  JD      Encounter Date: 04/26/2019  Check In: Session Check In - 04/26/19 1030      Check-In   Supervising physician immediately available to respond to emergencies  See telemetry face sheet for immediately available ER MD    Location  [facility]-Cardiac & Pulmonary Rehab    Staff Present  Renita Papa, RN Vickki Hearing, BA, ACSM CEP, Exercise Physiologist;Joseph Tessie Fass RCP,RRT,BSRT    Virtual Visit  No    Medication changes reported      No    Warm-up and Cool-down  Performed on first and last piece of equipment    Resistance Training Performed  Yes    VAD Patient?  No    PAD/SET Patient?  No      Pain Assessment   Currently in Pain?  No/denies          Social History   Tobacco Use  Smoking Status Never Smoker  Smokeless Tobacco Never Used    Goals Met:  Independence with exercise equipment Exercise tolerated well No report of cardiac concerns or symptoms Strength training completed today  Goals Unmet:  Not Applicable  Comments: Pt able to follow exercise prescription today without complaint.  Will continue to monitor for progression.    Dr. Emily Filbert is Medical Director for [place] and [facility].

## 2019-04-27 ENCOUNTER — Other Ambulatory Visit: Payer: Self-pay | Admitting: Internal Medicine

## 2019-04-29 ENCOUNTER — Encounter: Payer: PPO | Admitting: *Deleted

## 2019-04-29 ENCOUNTER — Other Ambulatory Visit: Payer: Self-pay

## 2019-04-29 DIAGNOSIS — I214 Non-ST elevation (NSTEMI) myocardial infarction: Secondary | ICD-10-CM

## 2019-04-29 MED ORDER — TRAZODONE HCL 150 MG PO TABS: 150.0000 mg | ORAL_TABLET | Freq: Every day | ORAL | 1 refills | Status: DC

## 2019-04-29 MED ORDER — FLUOXETINE HCL 10 MG PO TABS: 10.0000 mg | ORAL_TABLET | Freq: Every day | ORAL | 1 refills | Status: DC

## 2019-04-29 NOTE — Progress Notes (Signed)
Daily Session Note  Patient Details  Name: Haley Young MRN: [id number] Date of Birth: [birth year] Referring Provider:     Cardiac Rehab from 02/11/2019 in [facility] Cardiac and Pulmonary Rehab  Referring Provider  Haley Young      Encounter Date: 04/29/2019  Check In: Session Check In - 04/29/19 1038      Check-In   Supervising physician immediately available to respond to emergencies  See telemetry face sheet for immediately available ER MD    Location  [facility]-Cardiac & Pulmonary Rehab    Staff Present  Haley Papa, RN BSN;Haley Young [address] [place], [place], ACSM CEP, Exercise Physiologist    Virtual Visit  No    Medication changes reported      No    Fall or balance concerns reported     No    Warm-up and Cool-down  Performed on first and last piece of equipment    Resistance Training Performed  Yes    VAD Patient?  No    PAD/SET Patient?  No      Pain Assessment   Currently in Pain?  No/denies          Social History   Tobacco Use  Smoking Status Never Smoker  Smokeless Tobacco Never Used    Goals Met:  Independence with exercise equipment Exercise tolerated well No report of cardiac concerns or symptoms Strength training completed today  Goals Unmet:  Not Applicable  Comments: Pt able to follow exercise prescription today without complaint.  Will continue to monitor for progression.    Dr. Emily Young is Medical Director for [place] and [facility].

## 2019-04-29 NOTE — Telephone Encounter (Signed)
I have refilled the medications 

## 2019-04-30 ENCOUNTER — Other Ambulatory Visit: Payer: Self-pay | Admitting: Internal Medicine

## 2019-04-30 DIAGNOSIS — E1165 Type 2 diabetes mellitus with hyperglycemia: Secondary | ICD-10-CM

## 2019-05-01 ENCOUNTER — Encounter: Payer: Self-pay | Admitting: Internal Medicine

## 2019-05-01 ENCOUNTER — Other Ambulatory Visit: Payer: Self-pay

## 2019-05-01 ENCOUNTER — Encounter: Payer: PPO | Admitting: *Deleted

## 2019-05-01 ENCOUNTER — Ambulatory Visit (INDEPENDENT_AMBULATORY_CARE_PROVIDER_SITE_OTHER): Payer: PPO | Admitting: Internal Medicine

## 2019-05-01 VITALS — BP 124/68 | Ht 62.0 in | Wt 145.0 lb

## 2019-05-01 DIAGNOSIS — E1165 Type 2 diabetes mellitus with hyperglycemia: Secondary | ICD-10-CM

## 2019-05-01 DIAGNOSIS — G8929 Other chronic pain: Secondary | ICD-10-CM

## 2019-05-01 DIAGNOSIS — I214 Non-ST elevation (NSTEMI) myocardial infarction: Secondary | ICD-10-CM | POA: Diagnosis not present

## 2019-05-01 DIAGNOSIS — Z794 Long term (current) use of insulin: Secondary | ICD-10-CM

## 2019-05-01 DIAGNOSIS — M533 Sacrococcygeal disorders, not elsewhere classified: Secondary | ICD-10-CM | POA: Diagnosis not present

## 2019-05-01 DIAGNOSIS — E039 Hypothyroidism, unspecified: Secondary | ICD-10-CM

## 2019-05-01 DIAGNOSIS — R7401 Elevation of levels of liver transaminase levels: Secondary | ICD-10-CM | POA: Diagnosis not present

## 2019-05-01 DIAGNOSIS — M85852 Other specified disorders of bone density and structure, left thigh: Secondary | ICD-10-CM

## 2019-05-01 NOTE — Progress Notes (Signed)
Daily Session Note  Patient Details  Name: Haley Young MRN: [id number] Date of Birth: [birth year] Referring Provider:     Cardiac Rehab from 02/11/2019 in [facility] Cardiac and Pulmonary Rehab  Referring Provider  JD      Encounter Date: 05/01/2019  Check In: Session Check In - 05/01/19 1031      Check-In   Supervising physician immediately available to respond to emergencies  See telemetry face sheet for immediately available ER MD    Location  [facility]-Cardiac & Pulmonary Rehab    Staff Present  Darel Hong, RN BSN;Jessica Luan Pulling, MA, RCEP, CCRP, CCET;Joseph Meno RCP,RRT,BSRT    Virtual Visit  No    Medication changes reported      No    Fall or balance concerns reported     No    Warm-up and Cool-down  Performed on first and last piece of equipment    Resistance Training Performed  Yes    VAD Patient?  No    PAD/SET Patient?  No      Pain Assessment   Currently in Pain?  No/denies          Social History   Tobacco Use  Smoking Status Never Smoker  Smokeless Tobacco Never Used    Goals Met:  Independence with exercise equipment Exercise tolerated well No report of cardiac concerns or symptoms Strength training completed today  Goals Unmet:  Not Applicable  Comments: Pt able to follow exercise prescription today without complaint.  Will continue to monitor for progression.    Dr. Emily Filbert is Medical Director for [place] and [facility].

## 2019-05-01 NOTE — Progress Notes (Signed)
Virtual Visit via [email or web address]  This visit type was conducted due to national recommendations for restrictions regarding the COVID-19 pandemic (e.g. social distancing).  This format is felt to be most appropriate for this patient at this time.  All issues noted in this document were discussed and addressed.  No physical exam was performed (except for noted visual exam findings with Video Visits).   I connected with@ on 05/01/19 at  8:30 AM EDT by a video enabled telemedicine application  and verified that I am speaking with the correct person using two identifiers. Location patient: home Location provider: work or home office Persons participating in the virtual visit: patient, provider  I discussed the limitations, risks, security and privacy concerns of performing an evaluation and management service by telephone and the availability of in person appointments. I also discussed with the patient that there may be a patient responsible charge related to this service. The patient expressed understanding and agreed to proceed.  Reason for visit:  New onset left sied back pain  HPI:   68 YR OLD FEMALE  With osteopenia by 2018 DEXA presents with 3 week history of low back/SI/hip pain brought on by position change ,  from sitting to standing up .  Pain improves once she has been moving for  A few minutes.  The pain is severe and makes her apprehensive about moving.  Denies any history of fall , or unusual activity  Has been applying Blu Emu and salon pas patches with lidocaine which helps.   2)  Type 2 DM:  Has reduced insulin to 10 units in the afternoon (Lantus) .  Also using  Metformin,  Glipizide and Ozempic. Diarrhea has stopped with dc of supplement she was taking on her own ("gluocil")  Last a1c was 10,  Over 3 months ago.  fastings have ranged from 68 to 100.  Not checking any other time. Has endocrinology follow up  next week for a1c   Lab Results  Component Value Date   HGBA1C 10.4 (H) 11/27/2018    Lab Results  Component Value Date   TSH 0.29 (A) 08/30/2018     3( Elevated liver enzymes:  Did not return for repeat labs after ALT/ALT were noted to be elevated 5 months ago.  Not repeated in July at [facility]: See pertinent positives and negatives per HPI.  Past Medical History:  Diagnosis Date  . Asthma   . Chicken pox   . Depression   . Diabetes mellitus   . Frequent UTI   . GERD (gastroesophageal reflux disease)   . Heart disease   . Heart murmur   . 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 Hx of colonic polyps   . Hyperhidrosis    especially with hot flashes    . Hyperlipidemia   . Lipoma    R axilla   . MRSA cellulitis    surgical wound infection  . Osteoporosis   . Pyelocystitis    as a child   . Thyroid disease   . Urinary incontinence     Past Surgical History:  Procedure Laterality Date  . ABDOMINAL HYSTERECTOMY  1970  . [place], 2005   pulmonary stenosis with valve replacement, Dr. Evelina Dun  . PARTIAL HYSTERECTOMY  1970  . PULMONARY VEIN STENOSIS REPAIR    . VALVE REPLACEMENT      Family History  Problem Relation Age of Onset  . Coarctation of the  aorta Sister   . Diabetes Mother   . COPD Mother   . Obesity Mother   . Depression Mother   . Pancreatic cancer Mother   . Alcohol abuse Father   . Cirrhosis Father   . Down syndrome Brother   . Pneumonia Brother   . Down syndrome Daughter   . Lupus Daughter   . Ulcerative colitis Daughter   . Diabetes Maternal Aunt     SOCIAL HX:  reports that she has never smoked. She has never used smokeless tobacco. She reports current alcohol use. She reports that she does not use drugs.   Current Outpatient Medications:  .  apixaban (ELIQUIS) 5 MG TABS tablet, Take 1 tablet (5 mg total) by mouth 2 (two) times daily., Disp: 60 tablet, Rfl: 11 .  atorvastatin (LIPITOR) 40 MG tablet, Take 40 mg by mouth daily., Disp: , Rfl:  .  Fludrocortisone Acetate (FLORINEF  PO), Take 0.1 mg by mouth daily., Disp: , Rfl:  .  FLUoxetine (PROZAC) 10 MG tablet, Take 1 tablet (10 mg total) by mouth daily., Disp: 90 tablet, Rfl: 1 .  furosemide (LASIX) 20 MG tablet, Take 20 mg by mouth daily. , Disp: , Rfl:  .  glipiZIDE (GLUCOTROL) 10 MG tablet, Take 1 tablet (10 mg total) by mouth 2 (two) times daily before a meal., Disp: 180 tablet, Rfl: 3 .  glucose blood (ONE TOUCH ULTRA TEST) test strip, USE ONE STRIP TO CHECK GLUCOSE THREE TIMES DAILY AS DIRECTED E11.65, Disp: 200 each, Rfl: 2 .  hydrocortisone (CORTEF) 10 MG tablet, Take 5 mg by mouth 2 (two) times daily. 10 mg QAM, 5 mg lunch, 5 PM, Disp: , Rfl:  .  hydrocortisone sodium succinate (SOLU-CORTEF) 100 MG SOLR injection, Use 1 injection for adrenal crisis, Disp: , Rfl:  .  Insulin Glargine (LANTUS SOLOSTAR) 100 UNIT/ML Solostar Pen, Inject 15 Units into the skin daily. ADJUST UPWARD AS NEEDED EVERY 3 DAYS, Disp: 5 pen, Rfl: PRN .  Insulin Pen Needle 32G X 4 MM MISC, Used to give daily insulin injections., Disp: 100 each, Rfl: 12 .  levothyroxine (SYNTHROID) 112 MCG tablet, Take 1 tablet (112 mcg total) by mouth daily., Disp: 90 tablet, Rfl: 0 .  metformin (FORTAMET) 1000 MG (OSM) 24 hr tablet, Take 1,000 mg by mouth daily with breakfast., Disp: , Rfl:  .  metoprolol succinate (TOPROL-XL) 25 MG 24 hr tablet, Take by mouth., Disp: , Rfl:  .  mirtazapine (REMERON) 7.5 MG tablet, TAKE 1 TABLET BY MOUTH EVERYDAY AT BEDTIME, Disp: 90 tablet, Rfl: 1 .  Potassium Chloride ER 20 MEQ TBCR, Take 1 tablet by mouth once daily, Disp: 30 tablet, Rfl: 2 .  Semaglutide, 1 MG/DOSE, (OZEMPIC, 1 MG/DOSE,) 2 MG/1.5ML SOPN, Inject into the skin. Inject into stomach 1 time weekly, Disp: , Rfl:  .  traZODone (DESYREL) 150 MG tablet, Take 1 tablet (150 mg total) by mouth at bedtime., Disp: 90 tablet, Rfl: 1  EXAM:  VITALS per patient if applicable:  GENERAL: alert, oriented, appears well and in no acute distress  HEENT: atraumatic,  conjunttiva clear, no obvious abnormalities on inspection of external nose and ears  NECK: normal movements of the head and neck  LUNGS: on inspection no signs of respiratory distress, breathing rate appears normal, no obvious gross SOB, gasping or wheezing  CV: no obvious cyanosis  MS: moves all visible extremities without noticeable abnormality  PSYCH/NEURO: pleasant and cooperative, no obvious depression or anxiety, speech and thought processing  grossly intact  ASSESSMENT AND PLAN:  Discussed the following assessment and plan:  Chronic left SI joint pain - Plan: DG HIP UNILAT WITH PELVIS 2-3 VIEWS LEFT, DG Lumbar Spine Complete  Osteopenia of neck of left femur  Acquired hypothyroidism - Plan: TSH  Type 2 diabetes mellitus with hyperglycemia, with long-term current use of insulin (HCC)  Transaminitis  Type 2 diabetes mellitus with hyperglycemia, with long-term current use of insulin (HCC) Needs a1c  Advised to check bs post prandially in preparation for Endicrinology visit next week .  Continue metformin glipizide ozempci and lantus   Add evening snack of Protein shake   Chronic left SI joint pain History suggestive of OA but she has osteopenia by 2018 DEXA .  Plain films of SI joint,  Hip and LS ordered   Transaminitis Repeat hepatic enzymes needed.  Avoid tylenol until normalized     I discussed the assessment and treatment plan with the patient. The patient was provided an opportunity to ask questions and all were answered. The patient agreed with the plan and demonstrated an understanding of the instructions.   The patient was advised to call back or seek an in-person evaluation if the symptoms worsen or if the condition fails to improve as anticipated.  I provided  25 minutes of non-face-to-face time during this encounter reviewing patient's current problems and post surgeries.  Providing counseling on the above mentioned problems , and coordination  of care .

## 2019-05-01 NOTE — Assessment & Plan Note (Signed)
History suggestive of OA but she has osteopenia by 2018 DEXA .  Plain films of SI joint,  Hip and LS ordered

## 2019-05-01 NOTE — Assessment & Plan Note (Signed)
Needs a1c  Advised to check bs post prandially in preparation for Endicrinology visit next week .  Continue metformin glipizide ozempci and lantus   Add evening snack of Protein shake

## 2019-05-01 NOTE — Progress Notes (Signed)
Pt stated that about 3 weeks ago she started having left side lower back pain. She stated that it only hurts when she is in the motion of going from sitting to standing. She stated that it is very difficult to get up but once she gets moving it goes away.

## 2019-05-01 NOTE — Assessment & Plan Note (Signed)
Repeat hepatic enzymes needed.  Avoid tylenol until normalized

## 2019-05-02 ENCOUNTER — Ambulatory Visit (INDEPENDENT_AMBULATORY_CARE_PROVIDER_SITE_OTHER): Payer: PPO

## 2019-05-02 ENCOUNTER — Other Ambulatory Visit: Payer: Self-pay

## 2019-05-02 ENCOUNTER — Other Ambulatory Visit (INDEPENDENT_AMBULATORY_CARE_PROVIDER_SITE_OTHER): Payer: PPO

## 2019-05-02 DIAGNOSIS — M5136 Other intervertebral disc degeneration, lumbar region: Secondary | ICD-10-CM | POA: Diagnosis not present

## 2019-05-02 DIAGNOSIS — M533 Sacrococcygeal disorders, not elsewhere classified: Secondary | ICD-10-CM

## 2019-05-02 DIAGNOSIS — Z794 Long term (current) use of insulin: Secondary | ICD-10-CM

## 2019-05-02 DIAGNOSIS — G8929 Other chronic pain: Secondary | ICD-10-CM

## 2019-05-02 DIAGNOSIS — E1165 Type 2 diabetes mellitus with hyperglycemia: Secondary | ICD-10-CM

## 2019-05-02 DIAGNOSIS — E039 Hypothyroidism, unspecified: Secondary | ICD-10-CM

## 2019-05-02 DIAGNOSIS — M25552 Pain in left hip: Secondary | ICD-10-CM | POA: Diagnosis not present

## 2019-05-02 LAB — LIPID PANEL
Cholesterol: 127 mg/dL (ref 0–200)
HDL: 51.6 mg/dL (ref >39.00)
LDL Cholesterol: 58 mg/dL (ref 0–99)
NonHDL: 75.75
Total CHOL/HDL Ratio: 2
Triglycerides: 90 mg/dL (ref 0.0–149.0)
VLDL: 18 mg/dL (ref 0.0–40.0)

## 2019-05-02 LAB — COMPREHENSIVE METABOLIC PANEL
ALT: 28 U/L (ref 0–35)
AST: 36 U/L (ref 0–37)
Albumin: 3.9 g/dL (ref 3.5–5.2)
Alkaline Phosphatase: 102 U/L (ref 39–117)
BUN: 20 mg/dL (ref 6–23)
CO2: 32 mEq/L (ref 19–32)
Calcium: 9.5 mg/dL (ref 8.4–10.5)
Chloride: 103 mEq/L (ref 96–112)
Creatinine, Ser: 0.87 mg/dL (ref 0.40–1.20)
GFR: 64.23 mL/min (ref >60.00)
Glucose, Bld: 242 mg/dL — ABNORMAL HIGH (ref 70–99)
Potassium: 3.9 mEq/L (ref 3.5–5.1)
Sodium: 140 mEq/L (ref 135–145)
Total Bilirubin: 0.4 mg/dL (ref 0.2–1.2)
Total Protein: 6.7 g/dL (ref 6.0–8.3)

## 2019-05-02 LAB — TSH: TSH: 0.76 u[IU]/mL (ref 0.35–4.50)

## 2019-05-02 LAB — MICROALBUMIN / CREATININE URINE RATIO
Creatinine,U: 77.4 mg/dL
Microalb Creat Ratio: 3.8 mg/g (ref 0.0–30.0)
Microalb, Ur: 3 mg/dL — ABNORMAL HIGH (ref 0.0–1.9)

## 2019-05-02 LAB — HEMOGLOBIN A1C: Hgb A1c MFr Bld: 7.8 % — ABNORMAL HIGH (ref 4.6–6.5)

## 2019-05-03 ENCOUNTER — Encounter: Payer: PPO | Admitting: *Deleted

## 2019-05-03 DIAGNOSIS — I214 Non-ST elevation (NSTEMI) myocardial infarction: Secondary | ICD-10-CM | POA: Diagnosis not present

## 2019-05-03 NOTE — Progress Notes (Signed)
Daily Session Note  Patient Details  Name: Haley Young MRN: [id number] Date of Birth: May 10, 1951 Referring Provider:     Cardiac Rehab from 02/11/2019 in [facility] Cardiac and Pulmonary Rehab  Referring Provider  JD      Encounter Date: 05/03/2019  Check In: Session Check In - 05/03/19 1029      Check-In   Supervising physician immediately available to respond to emergencies  See telemetry face sheet for immediately available ER MD    Location  [facility]-Cardiac & Pulmonary Rehab    Staff Present  Heath Lark, RN, BSN, CCRP;Jessica Lydia, MA, RCEP, CCRP, [place], [place], ACSM CEP, Exercise Physiologist    Virtual Visit  No    Medication changes reported      No    Fall or balance concerns reported     No    Warm-up and Cool-down  Performed on first and last piece of equipment    Resistance Training Performed  Yes    VAD Patient?  No    PAD/SET Patient?  No      Pain Assessment   Currently in Pain?  No/denies          Social History   Tobacco Use  Smoking Status Never Smoker  Smokeless Tobacco Never Used    Goals Met:  Independence with exercise equipment Exercise tolerated well No report of cardiac concerns or symptoms  Goals Unmet:  Not Applicable  Comments: Pt able to follow exercise prescription today without complaint.  Will continue to monitor for progression.    Dr. Emily Filbert is Medical Director for [place] and [facility].

## 2019-05-06 ENCOUNTER — Encounter: Payer: PPO | Admitting: *Deleted

## 2019-05-06 ENCOUNTER — Other Ambulatory Visit: Payer: Self-pay

## 2019-05-06 DIAGNOSIS — I214 Non-ST elevation (NSTEMI) myocardial infarction: Secondary | ICD-10-CM | POA: Diagnosis not present

## 2019-05-06 NOTE — Progress Notes (Signed)
Daily Session Note  Patient Details  Name: Haley Young MRN: [id number] Date of Birth: 02/10/1951 Referring Provider:     Cardiac Rehab from 02/11/2019 in [facility] Cardiac and Pulmonary Rehab  Referring Provider  JD      Encounter Date: 05/06/2019  Check In: Session Check In - 05/06/19 1033      Check-In   Supervising physician immediately available to respond to emergencies  See telemetry face sheet for immediately available ER MD    Location  [facility]-Cardiac & Pulmonary Rehab    Staff Present  Heath Lark, RN, BSN, CCRP;Joseph Hood RCP,RRT,BSRT;Kelly [place], [place], ACSM CEP, Exercise Physiologist    Virtual Visit  No    Medication changes reported      No    Fall or balance concerns reported     No    Warm-up and Cool-down  Performed on first and last piece of equipment    Resistance Training Performed  Yes    VAD Patient?  No    PAD/SET Patient?  No      Pain Assessment   Currently in Pain?  No/denies          Social History   Tobacco Use  Smoking Status Never Smoker  Smokeless Tobacco Never Used    Goals Met:  Independence with exercise equipment Exercise tolerated well No report of cardiac concerns or symptoms  Goals Unmet:  Not Applicable  Comments: Pt able to follow exercise prescription today without complaint.  Will continue to monitor for progression.    Dr. Emily Filbert is Medical Director for [place] and [facility].

## 2019-05-07 DIAGNOSIS — E271 Primary adrenocortical insufficiency: Secondary | ICD-10-CM | POA: Diagnosis not present

## 2019-05-07 DIAGNOSIS — Z23 Encounter for immunization: Secondary | ICD-10-CM | POA: Diagnosis not present

## 2019-05-08 ENCOUNTER — Encounter: Payer: PPO | Admitting: *Deleted

## 2019-05-08 ENCOUNTER — Other Ambulatory Visit: Payer: Self-pay

## 2019-05-08 DIAGNOSIS — I214 Non-ST elevation (NSTEMI) myocardial infarction: Secondary | ICD-10-CM | POA: Diagnosis not present

## 2019-05-08 NOTE — Progress Notes (Signed)
Daily Session Note  Patient Details  Name: ANNIECE BLEILER MRN: [id number] Date of Birth: 1951-03-15 Referring Provider:     Cardiac Rehab from 02/11/2019 in [facility] Cardiac and Pulmonary Rehab  Referring Provider  JD      Encounter Date: 05/08/2019  Check In: Session Check In - 05/08/19 1046      Check-In   Supervising physician immediately available to respond to emergencies  See telemetry face sheet for immediately available ER MD    Location  [facility]-Cardiac & Pulmonary Rehab    Staff Present  Renita Papa, RN BSN;Joseph Hood RCP,RRT,BSRT;Jeanna Durrell BS, Exercise Physiologist    Virtual Visit  No    Medication changes reported      No    Fall or balance concerns reported     No    Warm-up and Cool-down  Performed on first and last piece of equipment    Resistance Training Performed  Yes    VAD Patient?  No    PAD/SET Patient?  No      Pain Assessment   Currently in Pain?  No/denies          Social History   Tobacco Use  Smoking Status Never Smoker  Smokeless Tobacco Never Used    Goals Met:  Independence with exercise equipment Exercise tolerated well No report of cardiac concerns or symptoms Strength training completed today  Goals Unmet:  Not Applicable  Comments: Pt able to follow exercise prescription today without complaint.  Will continue to monitor for progression.    Dr. Emily Filbert is Medical Director for [place] and [facility].

## 2019-05-10 ENCOUNTER — Encounter: Payer: PPO | Admitting: *Deleted

## 2019-05-10 ENCOUNTER — Other Ambulatory Visit: Payer: Self-pay

## 2019-05-10 DIAGNOSIS — I214 Non-ST elevation (NSTEMI) myocardial infarction: Secondary | ICD-10-CM

## 2019-05-10 NOTE — Progress Notes (Signed)
Daily Session Note  Patient Details  Name: Haley Young MRN: [id number] Date of Birth: 03/12/1951 Referring Provider:     Cardiac Rehab from 02/11/2019 in [facility] Cardiac and Pulmonary Rehab  Referring Provider  [place]      Encounter Date: 05/10/2019  Check In: Session Check In - 05/10/19 1037      Check-In   Supervising physician immediately available to respond to emergencies  See telemetry face sheet for immediately available ER MD    Location  [facility]-Cardiac & Pulmonary Rehab    Staff Present  Renita Papa, RN BSN;Jessica [place], MA, RCEP, CCRP, CCET;Amanda Sommer, [place], ACSM CEP, Exercise Physiologist;Joseph Hood RCP,RRT,BSRT    Virtual Visit  No    Medication changes reported      No    Fall or balance concerns reported     No    Warm-up and Cool-down  Performed on first and last piece of equipment    Resistance Training Performed  Yes          Social History   Tobacco Use  Smoking Status Never Smoker  Smokeless Tobacco Never Used    Goals Met:  Independence with exercise equipment Exercise tolerated well No report of cardiac concerns or symptoms Strength training completed today  Goals Unmet:  Not Applicable  Comments: Pt able to follow exercise prescription today without complaint.  Will continue to monitor for progression.    Dr. Emily Filbert is Medical Director for [place] and [facility].

## 2019-05-13 ENCOUNTER — Other Ambulatory Visit: Payer: Self-pay

## 2019-05-13 ENCOUNTER — Encounter: Payer: PPO | Attending: Internal Medicine | Admitting: *Deleted

## 2019-05-13 DIAGNOSIS — Z79890 Hormone replacement therapy (postmenopausal): Secondary | ICD-10-CM | POA: Insufficient documentation

## 2019-05-13 DIAGNOSIS — F329 Major depressive disorder, single episode, unspecified: Secondary | ICD-10-CM | POA: Insufficient documentation

## 2019-05-13 DIAGNOSIS — E079 Disorder of thyroid, unspecified: Secondary | ICD-10-CM | POA: Insufficient documentation

## 2019-05-13 DIAGNOSIS — E785 Hyperlipidemia, unspecified: Secondary | ICD-10-CM | POA: Diagnosis not present

## 2019-05-13 DIAGNOSIS — Z794 Long term (current) use of insulin: Secondary | ICD-10-CM | POA: Insufficient documentation

## 2019-05-13 DIAGNOSIS — E119 Type 2 diabetes mellitus without complications: Secondary | ICD-10-CM | POA: Insufficient documentation

## 2019-05-13 DIAGNOSIS — Z79899 Other long term (current) drug therapy: Secondary | ICD-10-CM | POA: Insufficient documentation

## 2019-05-13 DIAGNOSIS — Z7901 Long term (current) use of anticoagulants: Secondary | ICD-10-CM | POA: Insufficient documentation

## 2019-05-13 DIAGNOSIS — I214 Non-ST elevation (NSTEMI) myocardial infarction: Secondary | ICD-10-CM | POA: Diagnosis not present

## 2019-05-13 DIAGNOSIS — Z7982 Long term (current) use of aspirin: Secondary | ICD-10-CM | POA: Diagnosis not present

## 2019-05-13 DIAGNOSIS — J45909 Unspecified asthma, uncomplicated: Secondary | ICD-10-CM | POA: Insufficient documentation

## 2019-05-13 DIAGNOSIS — Z86010 Personal history of colonic polyps: Secondary | ICD-10-CM | POA: Insufficient documentation

## 2019-05-13 NOTE — Progress Notes (Signed)
Daily Session Note  Patient Details  Name: Haley Young MRN: [id number] Date of Birth: [birth year] Referring Provider:     Cardiac Rehab from 02/11/2019 in [facility] Cardiac and Pulmonary Rehab  Referring Provider  JD      Encounter Date: 05/13/2019  Check In: Session Check In - 05/13/19 [place]      Check-In   Supervising physician immediately available to respond to emergencies  See telemetry face sheet for immediately available ER MD    Location  [facility]-Cardiac & Pulmonary Rehab    Staff Present  Renita Papa, RN Moises Blood, BS, ACSM CEP, Exercise Physiologist;Joseph Darrin [place], [place], RCEP, CCRP, CCET    Virtual Visit  No    Medication changes reported      No    Fall or balance concerns reported     No    Warm-up and Cool-down  Performed on first and last piece of equipment    Resistance Training Performed  Yes    VAD Patient?  No    PAD/SET Patient?  No      Pain Assessment   Currently in Pain?  No/denies          Social History   Tobacco Use  Smoking Status Never Smoker  Smokeless Tobacco Never Used    Goals Met:  Independence with exercise equipment Exercise tolerated well No report of cardiac concerns or symptoms Strength training completed today  Goals Unmet:  Not Applicable  Comments: Pt able to follow exercise prescription today without complaint.  Will continue to monitor for progression.    Dr. Emily Filbert is Medical Director for [place] and [facility].

## 2019-05-15 ENCOUNTER — Other Ambulatory Visit: Payer: Self-pay

## 2019-05-15 ENCOUNTER — Encounter: Payer: PPO | Admitting: *Deleted

## 2019-05-15 ENCOUNTER — Encounter: Payer: Self-pay | Admitting: *Deleted

## 2019-05-15 DIAGNOSIS — I214 Non-ST elevation (NSTEMI) myocardial infarction: Secondary | ICD-10-CM | POA: Diagnosis not present

## 2019-05-15 NOTE — Progress Notes (Signed)
Daily Session Note  Patient Details  Name: Haley Young MRN: [id number] Date of Birth: [birth year] Referring Provider:     Cardiac Rehab from 02/11/2019 in [facility] Cardiac and Pulmonary Rehab  Referring Provider  JD      Encounter Date: 05/15/2019  Check In: Session Check In - 05/15/19 1107      Check-In   Supervising physician immediately available to respond to emergencies  See telemetry face sheet for immediately available ER MD    Location  [facility]-Cardiac & Pulmonary Rehab    Staff Present  Darel Hong, RN BSN;Joseph Hood RCP,RRT,BSRT;Jeanna Durrell BS, Exercise Physiologist    Virtual Visit  No    Medication changes reported      No    Fall or balance concerns reported     No    Warm-up and Cool-down  Performed on first and last piece of equipment    Resistance Training Performed  Yes    VAD Patient?  No    PAD/SET Patient?  No      Pain Assessment   Currently in Pain?  No/denies          Social History   Tobacco Use  Smoking Status Never Smoker  Smokeless Tobacco Never Used    Goals Met:  Independence with exercise equipment Exercise tolerated well No report of cardiac concerns or symptoms Strength training completed today  Goals Unmet:  Not Applicable  Comments: Pt able to follow exercise prescription today without complaint.  Will continue to monitor for progression.    Dr. Emily Filbert is Medical Director for [place] and [facility].

## 2019-05-15 NOTE — Progress Notes (Signed)
Cardiac Individual Treatment Plan  Patient Details  Name: LASHARA UREY MRN: [id number] Date of Birth: December 22, 1950 Referring Provider:     Cardiac Rehab from 02/11/2019 in [facility] Cardiac and Pulmonary Rehab  Referring Provider  JD      Initial Encounter Date:    Cardiac Rehab from 02/11/2019 in [facility] Cardiac and Pulmonary Rehab  Date  02/11/19      Visit Diagnosis: NSTEMI (non-ST elevation myocardial infarction) [facility])  Patient's Home Medications on Admission:  Current Outpatient Medications:  .  apixaban (ELIQUIS) 5 MG TABS tablet, Take 1 tablet (5 mg total) by mouth 2 (two) times daily., Disp: 60 tablet, Rfl: 11 .  atorvastatin (LIPITOR) 40 MG tablet, Take 40 mg by mouth daily., Disp: , Rfl:  .  Fludrocortisone Acetate (FLORINEF PO), Take 0.1 mg by mouth daily., Disp: , Rfl:  .  FLUoxetine (PROZAC) 10 MG tablet, Take 1 tablet (10 mg total) by mouth daily., Disp: 90 tablet, Rfl: 1 .  furosemide (LASIX) 20 MG tablet, Take 20 mg by mouth daily. , Disp: , Rfl:  .  glipiZIDE (GLUCOTROL) 10 MG tablet, Take 1 tablet (10 mg total) by mouth 2 (two) times daily before a meal., Disp: 180 tablet, Rfl: 3 .  glucose blood (ONE TOUCH ULTRA TEST) test strip, USE ONE STRIP TO CHECK GLUCOSE THREE TIMES DAILY AS DIRECTED E11.65, Disp: 200 each, Rfl: 2 .  hydrocortisone (CORTEF) 10 MG tablet, Take 5 mg by mouth 2 (two) times daily. 10 mg QAM, 5 mg lunch, 5 PM, Disp: , Rfl:  .  hydrocortisone sodium succinate (SOLU-CORTEF) 100 MG SOLR injection, Use 1 injection for adrenal crisis, Disp: , Rfl:  .  Insulin Glargine (LANTUS SOLOSTAR) 100 UNIT/ML Solostar Pen, Inject 15 Units into the skin daily. ADJUST UPWARD AS NEEDED EVERY 3 DAYS, Disp: 5 pen, Rfl: PRN .  Insulin Pen Needle 32G X 4 MM MISC, Used to give daily insulin injections., Disp: 100 each, Rfl: 12 .  levothyroxine (SYNTHROID) 112 MCG tablet, Take 1 tablet (112 mcg total) by mouth daily., Disp: 90 tablet, Rfl: 0 .  metformin (FORTAMET) 1000 MG (OSM) 24 hr  tablet, Take 1,000 mg by mouth daily with breakfast., Disp: , Rfl:  .  metoprolol succinate (TOPROL-XL) 25 MG 24 hr tablet, Take by mouth., Disp: , Rfl:  .  mirtazapine (REMERON) 7.5 MG tablet, TAKE 1 TABLET BY MOUTH EVERYDAY AT BEDTIME, Disp: 90 tablet, Rfl: 1 .  Potassium Chloride ER 20 MEQ TBCR, Take 1 tablet by mouth once daily, Disp: 30 tablet, Rfl: 2 .  Semaglutide, 1 MG/DOSE, (OZEMPIC, 1 MG/DOSE,) 2 MG/1.5ML SOPN, Inject into the skin. Inject into stomach 1 time weekly, Disp: , Rfl:  .  traZODone (DESYREL) 150 MG tablet, Take 1 tablet (150 mg total) by mouth at bedtime., Disp: 90 tablet, Rfl: 1  Past Medical History: Past Medical History:  Diagnosis Date  . Asthma   . Chicken pox   . Depression   . Diabetes mellitus   . Frequent UTI   . GERD (gastroesophageal reflux disease)   . Heart disease   . Heart murmur   . 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 Hx of colonic polyps   . Hyperhidrosis    especially with hot flashes    . Hyperlipidemia   . Lipoma    R axilla   . MRSA cellulitis    surgical wound infection  . Osteoporosis   . Pyelocystitis  as a child   . Thyroid disease   . Urinary incontinence     Tobacco Use: Social History   Tobacco Use  Smoking Status Never Smoker  Smokeless Tobacco Never Used    Labs: Recent Review Flowsheet Data    Labs for ITP Cardiac and Pulmonary Rehab Latest Ref Rng & Units 05/11/2017 01/17/2018 06/29/2018 11/27/2018 05/02/2019   Cholestrol 0 - 200 mg/dL - - - - 127   LDLCALC 0 - 99 mg/dL - - - - 58   LDLDIRECT mg/dL - - - - -   HDL >39.00 mg/dL - - - - 51.60   Trlycerides 0.0 - 149.0 mg/dL - - - - 90.0   Hemoglobin A1c 4.6 - 6.5 % 7.0(H) 8.4(H) 9.5 10.4(H) 7.8(H)       Exercise Target Goals: Exercise Program Goal: Individual exercise prescription set using results from initial 6 min walk test and THRR while considering  patient's activity barriers and safety.   Exercise Prescription  Goal: Initial exercise prescription builds to 30-45 minutes a day of aerobic activity, 2-3 days per week.  Home exercise guidelines will be given to patient during program as part of exercise prescription that the participant will acknowledge.  Activity Barriers & Risk Stratification: Activity Barriers & Cardiac Risk Stratification - 02/07/19 1218      Activity Barriers & Cardiac Risk Stratification   Activity Barriers  Shortness of Breath   Has Addisons disease and SOB has since diagnosed   Cardiac Risk Stratification  High       6 Minute Walk: 6 Minute Walk    Row Name 02/11/19 1229         6 Minute Walk   Phase  Initial     Distance  1003 feet     Walk Time  6 minutes     # of Rest Breaks  0     MPH  1.89     METS  2.05     RPE  11     VO2 Peak  7.18     Resting HR  72 bpm     Resting BP  102/60     Max Ex. HR  85 bpm     Max Ex. BP  122/74     2 Minute Post BP  102/64        Oxygen Initial Assessment:   Oxygen Re-Evaluation:   Oxygen Discharge (Final Oxygen Re-Evaluation):   Initial Exercise Prescription: Initial Exercise Prescription - 02/11/19 1200      Date of Initial Exercise RX and Referring Provider   Date  02/11/19    Referring Provider  JD      NuStep   Level  2    SPM  60    Minutes  15    METs  2      Recumbant Elliptical   Level  1    RPM  30    Watts  20    Minutes  15    METs  2      REL-XR   Level  1    Watts  20    Speed  50    Minutes  15    METs  2      T5 Nustep   Level  1    SPM  50    Minutes  15    METs  2      Prescription Details   Frequency (times per week)  3  Duration  Progress to 30 minutes of continuous aerobic without signs/symptoms of physical distress      Intensity   THRR 40-80% of Max Heartrate  103-134    Ratings of Perceived Exertion  11-15    Perceived Dyspnea  0-4      Progression   Progression  Continue progressive overload as per policy without signs/symptoms or physical distress.       Resistance Training   Training Prescription  Yes    Weight  3    Reps  10-15       Perform Capillary Blood Glucose checks as needed.  Exercise Prescription Changes: Exercise Prescription Changes    Row Name 02/11/19 1200 02/28/19 1400 04/03/19 1300 04/24/19 1100       Response to Exercise   Blood Pressure (Admit)  102/60  100/62  118/60  120/66    Blood Pressure (Exercise)  122/74  142/80  128/70  128/60    Blood Pressure (Exit)  102/64  102/62  122/70  122/70    Heart Rate (Admit)  72 bpm  88 bpm  53 bpm  68 bpm    Heart Rate (Exercise)  85 bpm  129 bpm  89 bpm  107 bpm    Heart Rate (Exit)  63 bpm  117 bpm  61 bpm  71 bpm    Rating of Perceived Exertion (Exercise)  _0 Symptoms  no  none  none  none    Duration  Progress to 30 minutes of  aerobic without signs/symptoms of physical distress  Continue with 30 min of aerobic exercise without signs/symptoms of physical distress.  Continue with 30 min of aerobic exercise without signs/symptoms of physical distress.  Continue with 30 min of aerobic exercise without signs/symptoms of physical distress.    Intensity  THRR unchanged  THRR unchanged  THRR unchanged  THRR unchanged      Progression   Progression  -  Continue to progress workloads to maintain intensity without signs/symptoms of physical distress.  Continue to progress workloads to maintain intensity without signs/symptoms of physical distress.  Continue to progress workloads to maintain intensity without signs/symptoms of physical distress.    Average METs  -  2.07  2.5  2.17      Resistance Training   Training Prescription  -  Yes  Yes  Yes    Weight  -  3 lbs  3 lb  3 lbs    Reps  -  10-15  10-15  10-15      Interval Training   Interval Training  -  No  No  No      NuStep   Level  -  3  -  3    Minutes  -  15  -  15    METs  -  2.7  -  3      Recumbant Elliptical   Level  -  1  -  1    Minutes  -  15  -  15    METs  -  1.4  -  1.5      REL-XR    Level  -  1  -  -    Minutes  -  15  -  -    METs  -  2.2  -  -      T5 Nustep   Level  -  1  -  -  Minutes  -  15  -  -    METs  -  2  -  -      Home Exercise Plan   Plans to continue exercise at  -  -  -  Home (comment) walking    Frequency  -  -  -  Add 2 additional days to program exercise sessions.    Initial Home Exercises Provided  -  -  -  03/11/19       Exercise Comments: Exercise Comments    Row Name 02/13/19 1034           Exercise Comments  First full day of exercise!  Patient was oriented to gym and equipment including functions, settings, policies, and procedures.  Patient's individual exercise prescription and treatment plan were reviewed.  All starting workloads were established based on the results of the 6 minute walk test done at initial orientation visit.  The plan for exercise progression was also introduced and progression will be customized based on patient's performance and goals.          Exercise Goals and Review: Exercise Goals    Row Name 02/11/19 1241             Exercise Goals   Increase Physical Activity  Yes       Intervention  Provide advice, education, support and counseling about physical activity/exercise needs.;Develop an individualized exercise prescription for aerobic and resistive training based on initial evaluation findings, risk stratification, comorbidities and participant's personal goals.       Expected Outcomes  Short Term: Attend rehab on a regular basis to increase amount of physical activity.;Long Term: Add in home exercise to make exercise part of routine and to increase amount of physical activity.;Long Term: Exercising regularly at least 3-5 days a week.       Increase Strength and Stamina  Yes       Intervention  Provide advice, education, support and counseling about physical activity/exercise needs.;Develop an individualized exercise prescription for aerobic and resistive training based on initial evaluation  findings, risk stratification, comorbidities and participant's personal goals.       Expected Outcomes  Short Term: Increase workloads from initial exercise prescription for resistance, speed, and METs.;Short Term: Perform resistance training exercises routinely during rehab and add in resistance training at home;Long Term: Improve cardiorespiratory fitness, muscular endurance and strength as measured by increased METs and functional capacity (6MWT)       Able to understand and use rate of perceived exertion (RPE) scale  Yes       Intervention  Provide education and explanation on how to use RPE scale       Expected Outcomes  Short Term: Able to use RPE daily in rehab to express subjective intensity level;Long Term:  Able to use RPE to guide intensity level when exercising independently       Able to understand and use Dyspnea scale  Yes       Intervention  Provide education and explanation on how to use Dyspnea scale       Expected Outcomes  Short Term: Able to use Dyspnea scale daily in rehab to express subjective sense of shortness of breath during exertion;Long Term: Able to use Dyspnea scale to guide intensity level when exercising independently       Knowledge and understanding of Target Heart Rate Range (THRR)  Yes       Intervention  Provide education and explanation of THRR including how the  numbers were predicted and where they are located for reference       Expected Outcomes  Short Term: Able to state/look up THRR;Short Term: Able to use daily as guideline for intensity in rehab;Long Term: Able to use THRR to govern intensity when exercising independently       Able to check pulse independently  Yes       Intervention  Provide education and demonstration on how to check pulse in carotid and radial arteries.;Review the importance of being able to check your own pulse for safety during independent exercise       Expected Outcomes  Short Term: Able to explain why pulse checking is important  during independent exercise       Understanding of Exercise Prescription  Yes       Intervention  Provide education, explanation, and written materials on patient's individual exercise prescription       Expected Outcomes  Short Term: Able to explain program exercise prescription;Long Term: Able to explain home exercise prescription to exercise independently          Exercise Goals Re-Evaluation : Exercise Goals Re-Evaluation    Row Name 02/13/19 1035 02/28/19 1404 03/05/19 1640 03/11/19 1101 03/13/19 1115     Exercise Goal Re-Evaluation   Exercise Goals Review  Increase Physical Activity;Increase Strength and Stamina;Able to understand and use rate of perceived exertion (RPE) scale;Knowledge and understanding of Target Heart Rate Range (THRR);Understanding of Exercise Prescription  Increase Physical Activity;Increase Strength and Stamina;Understanding of Exercise Prescription  -  Increase Physical Activity;Increase Strength and Stamina;Able to understand and use rate of perceived exertion (RPE) scale;Able to check pulse independently;Understanding of Exercise Prescription  Increase Physical Activity;Increase Strength and Stamina;Understanding of Exercise Prescription   Comments  Reviewed RPE scale, THR and program prescription with pt today.  Pt voiced understanding and was given a copy of goals to take home.  Dereon is off to a good start in rehab. She is already up to level 3 on the NuStep.  We will continue to monitor her progress and review home exercise guidelines.  out since last review  Reviewed home exercise with pt today.  Pt plans to walk and use videos for exercise.  Reviewed THR, pulse, RPE, sign and symptoms, NTG use, and when to call 911 or MD.  Also discussed weather considerations and indoor options.  Pt voiced understanding.  Shomari is doing well in rehab.  She is doing some of her exercise at home as she has been caring for her husband after a fall.  She is feeling stronger overall.    Expected Outcomes  Short: Use RPE daily to regulate intensity. Long: Follow program prescription in THR.  Short: Review home exercise.  Long: Continue to follow program prescription.  -  Short - add 1 day of exercise per week at home Long - exercise on her own  Short: Exercise more at home.  Long: Continue to increase strength and stamina.   [place] Name 03/25/19 1130 04/03/19 1320 04/23/19 1536 04/24/19 1046       Exercise Goal Re-Evaluation   Exercise Goals Review  Increase Physical Activity;Increase Strength and Stamina  Increase Physical Activity;Increase Strength and Stamina;Able to understand and use rate of perceived exertion (RPE) scale;Knowledge and understanding of Target Heart Rate Range (THRR);Able to check pulse independently;Understanding of Exercise Prescription  Increase Physical Activity;Increase Strength and Stamina;Understanding of Exercise Prescription  Increase Physical Activity;Increase Strength and Stamina    Comments  Patient states that she does not  exercise at home. She states that she is just lazy. Informed her that is is important for her to exercise after she finishes the program. She states her husband got hurt at work and has to take care of him at the moment. Patient has a good street that she can walk on and is going to add walking one day a week.  Brad Mcgaughy has been doing well in rehab. She is now [place] on the NuStep as it's location in gym makes her cold.  She has been able to improve.  We will continue to monitor her progress.  Lucie is going to walk at the park when she is done with rehab. She is going to walk at least two days a week. She is coming to class regularly and is enjoying class. She would like to get more stamina and strength while she is in the program.    Expected Outcomes  Short: walk one day a week outside. Long: maintain an exercise routine after [facility].  -  Short: Walk more on off days.  Long: Continue to exericse independently  Short: turn up workloads on  machines. Long: maintain level increases.       Discharge Exercise Prescription (Final Exercise Prescription Changes): Exercise Prescription Changes - 04/24/19 1100      Response to Exercise   Blood Pressure (Admit)  120/66    Blood Pressure (Exercise)  128/60    Blood Pressure (Exit)  122/70    Heart Rate (Admit)  68 bpm    Heart Rate (Exercise)  107 bpm    Heart Rate (Exit)  71 bpm    Rating of Perceived Exertion (Exercise)  12    Symptoms  none    Duration  Continue with 30 min of aerobic exercise without signs/symptoms of physical distress.    Intensity  THRR unchanged      Progression   Progression  Continue to progress workloads to maintain intensity without signs/symptoms of physical distress.    Average METs  2.17      Resistance Training   Training Prescription  Yes    Weight  3 lbs    Reps  10-15      Interval Training   Interval Training  No      NuStep   Level  3    Minutes  15    METs  3      Recumbant Elliptical   Level  1    Minutes  15    METs  1.5      Home Exercise Plan   Plans to continue exercise at  Home (comment)   walking   Frequency  Add 2 additional days to program exercise sessions.    Initial Home Exercises Provided  03/11/19       Nutrition:  Target Goals: Understanding of nutrition guidelines, daily intake of sodium <1576m, cholesterol <2045m calories 30% from fat and 7% or less from saturated fats, daily to have 5 or more servings of fruits and vegetables.  Biometrics: Pre Biometrics - 02/11/19 1230      Pre Biometrics   Height  _0  (1.575 m)    Weight  140 lb 6.4 oz (63.7 kg)    BMI (Calculated)  25.67    Single Leg Stand  20.02 seconds        Nutrition Therapy Plan and Nutrition Goals: Nutrition Therapy & Goals - 02/11/19 1155      Nutrition Therapy   Diet  HH,  DM diet    Protein (specify units)  50g    Fiber  25 grams    Whole Grain Foods  3 servings    Saturated Fats  12 max. grams    Fruits and Vegetables   5 servings/day    Sodium  1.5 grams      Personal Nutrition Goals   Nutrition Goal  ST: try to add soluble fiber and reduce fatty food intake to manage diarrhea caused by medications LT: gain strength    Comments  Pt with Addisons disease, pt reports trying to eat enough sodium. Discussed HH and DM eating and diarrhea managemnet as she reports her medication can trigger diarrhea. Pt reports eating bagel with cream cheese (whole wheat), coffee with 2% milk and sweetener. tomato sandwich on whole wheat for lunch, and meat with 2 vegetables for dinner. (pork chops or fish (fried), chicken thighs without skin roasted)(corn, beans, peas, potatoes) pt reports also having fruit like melons (all different kinds). Pt does not tolerate bananas well.      Intervention Plan   Intervention  Prescribe, educate and counsel regarding individualized specific dietary modifications aiming towards targeted core components such as weight, hypertension, lipid management, diabetes, heart failure and other comorbidities.;Nutrition handout(s) given to patient.    Expected Outcomes  Short Term Goal: Understand basic principles of dietary content, such as calories, fat, sodium, cholesterol and nutrients.;Short Term Goal: A plan has been developed with personal nutrition goals set during dietitian appointment.;Long Term Goal: Adherence to prescribed nutrition plan.       Nutrition Assessments:   Nutrition Goals Re-Evaluation: Nutrition Goals Re-Evaluation    [place] Name 03/11/19 1132 04/03/19 1110 04/29/19 1135         Goals   Nutrition Goal  ST: try to add soluble fiber and reduce fatty food intake to manage diarrhea caused by medications LT: gain strength  ST: Try to add snacks and eat frequent meals, pair meals with protein, fat, and fiber LT: gain strength  ST: Try to add snacks and eat frequent meals, pair meals with protein, fat, and fiber LT: gain strength, manage BG     Comment  Pt reports still has bad diarrhea  since we last spoke a month ago, again encouraged her to speak to her doctor (pt thinks its her medicaitons), pt stays away from insoluble fiber due to diarrhea exacerbation - encouraged simple foods that her body can tolerate to help symptoms. Expresssed concern with dehydration or malnutrition with continued diarrhea.  Discussed new insulin and nutrition, hypo and hyper glycemia. Pt now on long-lasting insulin. No more diarrhea due to medications.  Pt reports her doctor lowered her insulin, pt reports struggling with adding snacks and eating frequenlty and asked for some suggestions; gave pt some examples of how to pair CHO and protein/fat. Pt was receptive.     Expected Outcome  Pt will contact doctor regarding diarrhea and diahhrea will subside.  ST: Try to add snacks and eat frequent meals, pair meals with protein, fat, and fiber LT: gain strength  ST: Try to add snacks and eat frequent meals, pair meals with protein, fat, and fiber LT: gain strength, manage BG        Nutrition Goals Discharge (Final Nutrition Goals Re-Evaluation): Nutrition Goals Re-Evaluation - 04/29/19 1135      Goals   Nutrition Goal  ST: Try to add snacks and eat frequent meals, pair meals with protein, fat, and fiber LT: gain strength, manage BG    Comment  Pt reports her doctor lowered her insulin, pt reports struggling with adding snacks and eating frequenlty and asked for some suggestions; gave pt some examples of how to pair CHO and protein/fat. Pt was receptive.    Expected Outcome  ST: Try to add snacks and eat frequent meals, pair meals with protein, fat, and fiber LT: gain strength, manage BG       Psychosocial: Target Goals: Acknowledge presence or absence of significant depression and/or stress, maximize coping skills, provide positive support system. Participant is able to verbalize types and ability to use techniques and skills needed for reducing stress and depression.   Initial Review & Psychosocial  Screening: Initial Psych Review & Screening - 02/07/19 1220      Initial Review   Current issues with  Current Stress Concerns    Source of Stress Concerns  Retirement/disability;Family    Comments  Always worked and had to quit work. Not having anything to do.  COVID 19 restricting ability to socialize.   Children making sure they are carede for if something happens to her.      Family Dynamics   Good Support System?  Yes   Husband, Family, neighbors/friends and church     Barriers   Psychosocial barriers to participate in program  There are no identifiable barriers or psychosocial needs.;The patient should benefit from training in stress management and relaxation.      Screening Interventions   Interventions  Encouraged to exercise;Provide feedback about the scores to participant;To provide support and resources with identified psychosocial needs    Expected Outcomes  Short Term goal: Utilizing psychosocial counselor, staff and physician to assist with identification of specific Stressors or current issues interfering with healing process. Setting desired goal for each stressor or current issue identified.;Long Term Goal: Stressors or current issues are controlled or eliminated.;Short Term goal: Identification and review with participant of any Quality of Life or Depression concerns found by scoring the questionnaire.;Long Term goal: The participant improves quality of Life and PHQ9 Scores as seen by post scores and/or verbalization of changes       Quality of Life Scores:   Scores of 19 and below usually indicate a poorer quality of life in these areas.  A difference of  2-3 points is a clinically meaningful difference.  A difference of 2-3 points in the total score of the Quality of Life Index has been associated with significant improvement in overall quality of life, self-image, physical symptoms, and general health in studies assessing change in quality of life.  PHQ-9: Recent Review  Flowsheet Data    Depression screen [facility] 2/9 03/29/2019 12/10/2018 07/12/2018 07/10/2017 07/08/2016   Decreased Interest 0 0 1 0 0   Down, Depressed, Hopeless 1 1 0 0 0   PHQ - 2 Score _0 0 0     Interpretation of Total Score  Total Score Depression Severity:  1-4 = Minimal depression, 5-9 = Mild depression, 10-14 = Moderate depression, 15-19 = Moderately severe depression, 20-27 = Severe depression   Psychosocial Evaluation and Intervention:   Psychosocial Re-Evaluation: Psychosocial Re-Evaluation    [place] Name 03/11/19 1105 03/13/19 1116 03/25/19 1133 04/24/19 1050       Psychosocial Re-Evaluation   Current issues with  Current Stress Concerns  Current Stress Concerns  Current Stress Concerns  Current Stress Concerns    Comments  Adi spoke with her Dr about stress - she has been really upset since she stopped breathing.  Her Dr suggested counseling.  Asiya is doing okay.  Her husband fell and broke his knee cap and she has been caring for him.  He is making her crazy.  Her doctor thinks she may PTSD.  She talked with someone this morning and was given a reference for someone to meet with.,  Her husband fell at work and she is taking care of him. She wants to travel but her children have special needs and is not able to. She has to take her son to work since he cannot drive. Sometimes she wonders how and why she is still alive after her code blue. She is mostly positive and is trying to stay active.  Her husband is back at work from hurting his leg. Jessel has been less stressed now that her husband is at work. He likes to stay busy and she is happy that he is able to work and be happy. They get along very well and she just doesnt like to see him bored.    Expected Outcomes  Short - staff will have counselor call Vivianne Master will meet with counselor as needed  Short: Reach out to reference counselor. Long: Continue to seek self care.  Short: attend [facility] regularly. Long: Maintain exercise to  decrease stress levels.  Short: workout regularly to reduce stress. Long: maintain an exercise routine to keep stress at a minimum.    Interventions  Encouraged to attend Cardiac Rehabilitation for the exercise  Encouraged to attend Cardiac Rehabilitation for the exercise  Encouraged to attend Cardiac Rehabilitation for the exercise  Encouraged to attend Cardiac Rehabilitation for the exercise    Continue Psychosocial Services   -  Follow up required by staff  Follow up required by staff  Follow up required by staff       Psychosocial Discharge (Final Psychosocial Re-Evaluation): Psychosocial Re-Evaluation - 04/24/19 1050      Psychosocial Re-Evaluation   Current issues with  Current Stress Concerns    Comments  Her husband is back at work from hurting his leg. Diamonds has been less stressed now that her husband is at work. He likes to stay busy and she is happy that he is able to work and be happy. They get along very well and she just doesnt like to see him bored.    Expected Outcomes  Short: workout regularly to reduce stress. Long: maintain an exercise routine to keep stress at a minimum.    Interventions  Encouraged to attend Cardiac Rehabilitation for the exercise    Continue Psychosocial Services   Follow up required by staff       Vocational Rehabilitation: Provide vocational rehab assistance to qualifying candidates.   Vocational Rehab Evaluation & Intervention: Vocational Rehab - 02/07/19 1224      Initial Vocational Rehab Evaluation & Intervention   Assessment shows need for Vocational Rehabilitation  No       Education: Education Goals: Education classes will be provided on a variety of topics geared toward better understanding of heart health and risk factor modification. Participant will state understanding/return demonstration of topics presented as noted by education test scores.  Learning Barriers/Preferences: Learning Barriers/Preferences - 02/07/19 1224      Learning  Barriers/Preferences   Learning Barriers  None    Learning Preferences  None       Education Topics:  AED/CPR: - Group verbal and written instruction with the use of models to demonstrate the basic use of the AED with the basic ABC's of resuscitation.  General Nutrition Guidelines/Fats and Fiber: -Group instruction provided by verbal, written material, models and posters to present the general guidelines for heart healthy nutrition. Gives an explanation and review of dietary fats and fiber.   Controlling Sodium/Reading Food Labels: -Group verbal and written material supporting the discussion of sodium use in heart healthy nutrition. Review and explanation with models, verbal and written materials for utilization of the food label.   Exercise Physiology & General Exercise Guidelines: - Group verbal and written instruction with models to review the exercise physiology of the cardiovascular system and associated critical values. Provides general exercise guidelines with specific guidelines to those with heart or lung disease.    Aerobic Exercise & Resistance Training: - Gives group verbal and written instruction on the various components of exercise. Focuses on aerobic and resistive training programs and the benefits of this training and how to safely progress through these programs..   Flexibility, Balance, Mind/Body Relaxation: Provides group verbal/written instruction on the benefits of flexibility and balance training, including mind/body exercise modes such as yoga, pilates and tai chi.  Demonstration and skill practice provided.   Stress and Anxiety: - Provides group verbal and written instruction about the health risks of elevated stress and causes of high stress.  Discuss the correlation between heart/lung disease and anxiety and treatment options. Review healthy ways to manage with stress and anxiety.   Depression: - Provides group verbal and written instruction on the  correlation between heart/lung disease and depressed mood, treatment options, and the stigmas associated with seeking treatment.   Anatomy & Physiology of the Heart: - Group verbal and written instruction and models provide basic cardiac anatomy and physiology, with the coronary electrical and arterial systems. Review of Valvular disease and Heart Failure   Cardiac Procedures: - Group verbal and written instruction to review commonly prescribed medications for heart disease. Reviews the medication, class of the drug, and side effects. Includes the steps to properly store meds and maintain the prescription regimen. (beta blockers and nitrates)   Cardiac Medications I: - Group verbal and written instruction to review commonly prescribed medications for heart disease. Reviews the medication, class of the drug, and side effects. Includes the steps to properly store meds and maintain the prescription regimen.   Cardiac Medications II: -Group verbal and written instruction to review commonly prescribed medications for heart disease. Reviews the medication, class of the drug, and side effects. (all other drug classes)    Go Sex-Intimacy & Heart Disease, Get SMART - Goal Setting: - Group verbal and written instruction through game format to discuss heart disease and the return to sexual intimacy. Provides group verbal and written material to discuss and apply goal setting through the application of the S.M.A.R.T. Method.   Other Matters of the Heart: - Provides group verbal, written materials and models to describe Stable Angina and Peripheral Artery. Includes description of the disease process and treatment options available to the cardiac patient.   Exercise & Equipment Safety: - Individual verbal instruction and demonstration of equipment use and safety with use of the equipment.   Cardiac Rehab from 02/11/2019 in [facility] Cardiac and Pulmonary Rehab  Date  02/11/19  Educator  [place]  Instruction  Review Code  1- Verbalizes Understanding      Infection Prevention: - Provides verbal and written material to individual with discussion of infection control including proper hand washing and proper equipment cleaning during exercise session.   Cardiac Rehab from 02/11/2019 in [facility] Cardiac and Pulmonary Rehab  Date  02/11/19  Educator  [place]  Instruction Review Code  1- Verbalizes Understanding      Falls Prevention: - Provides verbal and written material to individual with discussion of falls prevention and safety.   Cardiac Rehab from 02/11/2019 in [facility] Cardiac and Pulmonary Rehab  Date  02/11/19  Educator  [place]  Instruction Review Code  1- Verbalizes Understanding      Diabetes: - Individual verbal and written instruction to review signs/symptoms of diabetes, desired ranges of glucose level fasting, after meals and with exercise. Acknowledge that pre and post exercise glucose checks will be done for 3 sessions at entry of program.   Cardiac Rehab from 02/11/2019 in [facility] Cardiac and Pulmonary Rehab  Date  02/11/19  Educator  [place]  Instruction Review Code  1- Verbalizes Understanding      Know Your Numbers and Risk Factors: -Group verbal and written instruction about important numbers in your health.  Discussion of what are risk factors and how they play a role in the disease process.  Review of Cholesterol, Blood Pressure, Diabetes, and BMI and the role they play in your overall health.   Sleep Hygiene: -Provides group verbal and written instruction about how sleep can affect your health.  Define sleep hygiene, discuss sleep cycles and impact of sleep habits. Review good sleep hygiene tips.    Other: -Provides group and verbal instruction on various topics (see comments)   Knowledge Questionnaire Score: Knowledge Questionnaire Score - 02/11/19 0909      Knowledge Questionnaire Score   Pre Score  18/26  Missed Nutrition, Angina and Exercise questions       Core Components/Risk  Factors/Patient Goals at Admission: Personal Goals and Risk Factors at Admission - 02/07/19 1225      Core Components/Risk Factors/Patient Goals on Admission    Weight Management  Yes;Weight Maintenance    Intervention  Weight Management: Develop a combined nutrition and exercise program designed to reach desired caloric intake, while maintaining appropriate intake of nutrient and fiber, sodium and fats, and appropriate energy expenditure required for the weight goal.;Weight Management: Provide education and appropriate resources to help participant work on and attain dietary goals.    Expected Outcomes  Short Term: Continue to assess and modify interventions until short term weight is achieved;Long Term: Adherence to nutrition and physical activity/exercise program aimed toward attainment of established weight goal    Diabetes  Yes    Intervention  Provide education about signs/symptoms and action to take for hypo/hyperglycemia.;Provide education about proper nutrition, including hydration, and aerobic/resistive exercise prescription along with prescribed medications to achieve blood glucose in normal ranges: Fasting glucose 65-99 mg/dL    Expected Outcomes  Short Term: Participant verbalizes understanding of the signs/symptoms and immediate care of hyper/hypoglycemia, proper foot care and importance of medication, aerobic/resistive exercise and nutrition plan for blood glucose control.;Long Term: Attainment of HbA1C < 7%.    Hypertension  Yes    Intervention  Provide education on lifestyle modifcations including regular physical activity/exercise, weight management, moderate sodium restriction and increased consumption of fresh fruit, vegetables, and low fat dairy, alcohol moderation, and smoking cessation.;Monitor prescription use compliance.    Expected Outcomes  Short Term: Continued assessment and intervention until BP is < 140/11m HG in hypertensive participants. < 130/815mHG in hypertensive  participants with diabetes, heart failure or chronic kidney disease.;Long Term: Maintenance of blood pressure at goal levels.    Lipids  Yes    Intervention  Provide education and support for participant on nutrition &  aerobic/resistive exercise along with prescribed medications to achieve LDL <55m, HDL >446m    Expected Outcomes  Short Term: Participant states understanding of desired cholesterol values and is compliant with medications prescribed. Participant is following exercise prescription and nutrition guidelines.;Long Term: Cholesterol controlled with medications as prescribed, with individualized exercise RX and with personalized nutrition plan. Value goals: LDL < 7075mHDL > 40 mg.       Core Components/Risk Factors/Patient Goals Review:  Goals and Risk Factor Review    Row Name 03/11/19 1102 03/11/19 1110 03/25/19 1136 04/24/19 1052       Core Components/Risk Factors/Patient Goals Review   Personal Goals Review  Weight Management/Obesity;Hypertension  -  Weight Management/Obesity;Hypertension;Lipids;Diabetes  Weight Management/Obesity;Hypertension;Lipids;Diabetes    Review  Kays Dr added metoprolol to her medications - her HR got high last week.  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  Kays Dr added metoprolol to her medications - her HR got high last week.  She has a machine at home to send her info to manage pacemaker.She checks BG at home - it runs around 155 - her Dr is ok with it being up to 180 due to taking steroids for Addisons disease  KayErikahs been maintaing her weight and is doing well. She is taking her medications regularly and is managing her diabetes. She is checking her sugar first thing every morning. KayCerissa not checking her blood pressure at home. She has a machine but does not check it. Her oxygen is good she checks it at home and is always in the mid 90's. Her blood pressure in the program has been 110/60s.  Patient has been maintaining her  weight since the start of the program. Her blood pressure exercising has been good when exercising. She has been taking care of her diabetes and taking her medications regularly. She has gained some weight since she was in the hospital now that her appitite is better.    Expected Outcomes  -  Short - keep checking BG at home and keep in close contact with your Dr/PA Long - keep BG and BP withon limits set by Dr  Short:Graduate from cardiac rehab. Long: maintain exercise and a healthy lifestyle post rehab.  Short: maintain current weight. Long: maintain weight independently.       Core Components/Risk Factors/Patient Goals at Discharge (Final Review):  Goals and Risk Factor Review - 04/24/19 1052      Core Components/Risk Factors/Patient Goals Review   Personal Goals Review  Weight Management/Obesity;Hypertension;Lipids;Diabetes    Review  Patient has been maintaining her weight since the start of the program. Her blood pressure exercising has been good when exercising. She has been taking care of her diabetes and taking her medications regularly. She has gained some weight since she was in the hospital now that her appitite is better.    Expected Outcomes  Short: maintain current weight. Long: maintain weight independently.       ITP Comments: ITP Comments    Row Name 02/07/19 1217 02/13/19 1035 02/20/19 0619 03/06/19 1125 03/20/19 0628   ITP Comments  Orientation completed virtually today   Appointment on Mon 8/3  ro EP and RD evals and gym orientation [facility] of [facility]  First full day of exercise!  Patient was oriented to gym and equipment including functions, settings, policies, and procedures.  Patient's individual exercise prescription and treatment plan were reviewed.  All starting workloads were  established based on the results of the 6 minute walk test done at initial orientation visit.  The plan for exercise progression was also introduced and  progression will be customized based on patient's performance and goals.  30 Day Review Completed today. Continue with ITP unless changed by Medical Director review.  New to program  Called to check on pt.  She was cleared by cardiology to return to rehab on 8/18 but she has not been back since then.  Left message.  30 Day review. Continue with ITP unless directed changes per Medical Director review.   [place] Name 03/29/19 1058 04/17/19 1124 05/15/19 0650       ITP Comments  Pt stated she will be starting insulin next week. Will be checking BG before exercise.  30 day review completed. ITP sent to Dr. Emily Filbert, Medical Director of Cardiac and Pulmonary Rehab. Continue with ITP unless changes are made by physician.  Department closed starting 10/2 until further notice by infection prevention and Health at Work teams for COVID-19.  30 day review completed. Continue with ITP sent to Dr. Emily Filbert, Medical Director of Cardiac and Pulmonary Rehab for review , changes as needed and signature.        Comments:

## 2019-05-17 ENCOUNTER — Other Ambulatory Visit: Payer: Self-pay

## 2019-05-17 ENCOUNTER — Encounter: Payer: PPO | Admitting: *Deleted

## 2019-05-17 DIAGNOSIS — I214 Non-ST elevation (NSTEMI) myocardial infarction: Secondary | ICD-10-CM | POA: Diagnosis not present

## 2019-05-17 NOTE — Progress Notes (Signed)
Daily Session Note  Patient Details  Name: MADINAH QUARRY MRN: [id number] Date of Birth: 05-02-1951 Referring Provider:     Cardiac Rehab from 02/11/2019 in [facility] Cardiac and Pulmonary Rehab  Referring Provider  JD      Encounter Date: 05/17/2019  Check In: Session Check In - 05/17/19 1045      Check-In   Supervising physician immediately available to respond to emergencies  See telemetry face sheet for immediately available ER MD    Location  [facility]-Cardiac & Pulmonary Rehab    Staff Present  Renita Papa, RN BSN;Jessica Lacey, MA, RCEP, CCRP, CCET;Amanda Sommer, [place], ACSM CEP, Exercise Physiologist;Joseph Hood RCP,RRT,BSRT    Virtual Visit  No    Medication changes reported      No    Fall or balance concerns reported     No    Warm-up and Cool-down  Performed on first and last piece of equipment    Resistance Training Performed  Yes    VAD Patient?  No    PAD/SET Patient?  No      Pain Assessment   Currently in Pain?  No/denies          Social History   Tobacco Use  Smoking Status Never Smoker  Smokeless Tobacco Never Used    Goals Met:  Independence with exercise equipment Exercise tolerated well No report of cardiac concerns or symptoms Strength training completed today  Goals Unmet:  Not Applicable  Comments: Pt able to follow exercise prescription today without complaint.  Will continue to monitor for progression.    Dr. Emily Filbert is Medical Director for [name] [facility].

## 2019-05-22 ENCOUNTER — Encounter: Payer: Self-pay | Admitting: *Deleted

## 2019-05-22 DIAGNOSIS — I214 Non-ST elevation (NSTEMI) myocardial infarction: Secondary | ICD-10-CM

## 2019-05-22 NOTE — Progress Notes (Signed)
Cardiac Individual Treatment Plan  Patient Details  Name: Haley Young MRN: [id number] Date of Birth: December 22, 1950 Referring Provider:     Cardiac Rehab from 02/11/2019 in [facility] Cardiac and Pulmonary Rehab  Referring Provider  JD      Initial Encounter Date:    Cardiac Rehab from 02/11/2019 in [facility] Cardiac and Pulmonary Rehab  Date  02/11/19      Visit Diagnosis: NSTEMI (non-ST elevation myocardial infarction) [facility])  Patient's Home Medications on Admission:  Current Outpatient Medications:  .  apixaban (ELIQUIS) 5 MG TABS tablet, Take 1 tablet (5 mg total) by mouth 2 (two) times daily., Disp: 60 tablet, Rfl: 11 .  atorvastatin (LIPITOR) 40 MG tablet, Take 40 mg by mouth daily., Disp: , Rfl:  .  Fludrocortisone Acetate (FLORINEF PO), Take 0.1 mg by mouth daily., Disp: , Rfl:  .  FLUoxetine (PROZAC) 10 MG tablet, Take 1 tablet (10 mg total) by mouth daily., Disp: 90 tablet, Rfl: 1 .  furosemide (LASIX) 20 MG tablet, Take 20 mg by mouth daily. , Disp: , Rfl:  .  glipiZIDE (GLUCOTROL) 10 MG tablet, Take 1 tablet (10 mg total) by mouth 2 (two) times daily before a meal., Disp: 180 tablet, Rfl: 3 .  glucose blood (ONE TOUCH ULTRA TEST) test strip, USE ONE STRIP TO CHECK GLUCOSE THREE TIMES DAILY AS DIRECTED E11.65, Disp: 200 each, Rfl: 2 .  hydrocortisone (CORTEF) 10 MG tablet, Take 5 mg by mouth 2 (two) times daily. 10 mg QAM, 5 mg lunch, 5 PM, Disp: , Rfl:  .  hydrocortisone sodium succinate (SOLU-CORTEF) 100 MG SOLR injection, Use 1 injection for adrenal crisis, Disp: , Rfl:  .  Insulin Glargine (LANTUS SOLOSTAR) 100 UNIT/ML Solostar Pen, Inject 15 Units into the skin daily. ADJUST UPWARD AS NEEDED EVERY 3 DAYS, Disp: 5 pen, Rfl: PRN .  Insulin Pen Needle 32G X 4 MM MISC, Used to give daily insulin injections., Disp: 100 each, Rfl: 12 .  levothyroxine (SYNTHROID) 112 MCG tablet, Take 1 tablet (112 mcg total) by mouth daily., Disp: 90 tablet, Rfl: 0 .  metformin (FORTAMET) 1000 MG (OSM) 24 hr  tablet, Take 1,000 mg by mouth daily with breakfast., Disp: , Rfl:  .  metoprolol succinate (TOPROL-XL) 25 MG 24 hr tablet, Take by mouth., Disp: , Rfl:  .  mirtazapine (REMERON) 7.5 MG tablet, TAKE 1 TABLET BY MOUTH EVERYDAY AT BEDTIME, Disp: 90 tablet, Rfl: 1 .  Potassium Chloride ER 20 MEQ TBCR, Take 1 tablet by mouth once daily, Disp: 30 tablet, Rfl: 2 .  Semaglutide, 1 MG/DOSE, (OZEMPIC, 1 MG/DOSE,) 2 MG/1.5ML SOPN, Inject into the skin. Inject into stomach 1 time weekly, Disp: , Rfl:  .  traZODone (DESYREL) 150 MG tablet, Take 1 tablet (150 mg total) by mouth at bedtime., Disp: 90 tablet, Rfl: 1  Past Medical History: Past Medical History:  Diagnosis Date  . Asthma   . Chicken pox   . Depression   . Diabetes mellitus   . Frequent UTI   . GERD (gastroesophageal reflux disease)   . Heart disease   . Heart murmur   . 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 Hx of colonic polyps   . Hyperhidrosis    especially with hot flashes    . Hyperlipidemia   . Lipoma    R axilla   . MRSA cellulitis    surgical wound infection  . Osteoporosis   . Pyelocystitis  as a child   . Thyroid disease   . Urinary incontinence     Tobacco Use: Social History   Tobacco Use  Smoking Status Never Smoker  Smokeless Tobacco Never Used    Labs: Recent Review Flowsheet Data    Labs for ITP Cardiac and Pulmonary Rehab Latest Ref Rng & Units 05/11/2017 01/17/2018 06/29/2018 11/27/2018 05/02/2019   Cholestrol 0 - 200 mg/dL - - - - 127   LDLCALC 0 - 99 mg/dL - - - - 58   LDLDIRECT mg/dL - - - - -   HDL >39.00 mg/dL - - - - 51.60   Trlycerides 0.0 - 149.0 mg/dL - - - - 90.0   Hemoglobin A1c 4.6 - 6.5 % 7.0(H) 8.4(H) 9.5 10.4(H) 7.8(H)       Exercise Target Goals: Exercise Program Goal: Individual exercise prescription set using results from initial 6 min walk test and THRR while considering  patient's activity barriers and safety.   Exercise Prescription  Goal: Initial exercise prescription builds to 30-45 minutes a day of aerobic activity, 2-3 days per week.  Home exercise guidelines will be given to patient during program as part of exercise prescription that the participant will acknowledge.  Activity Barriers & Risk Stratification: Activity Barriers & Cardiac Risk Stratification - 02/07/19 1218      Activity Barriers & Cardiac Risk Stratification   Activity Barriers  Shortness of Breath   Has Addisons disease and SOB has since diagnosed   Cardiac Risk Stratification  High       6 Minute Walk: 6 Minute Walk    Row Name 02/11/19 1229         6 Minute Walk   Phase  Initial     Distance  1003 feet     Walk Time  6 minutes     # of Rest Breaks  0     MPH  1.89     METS  2.05     RPE  11     VO2 Peak  7.18     Resting HR  72 bpm     Resting BP  102/60     Max Ex. HR  85 bpm     Max Ex. BP  122/74     2 Minute Post BP  102/64        Oxygen Initial Assessment:   Oxygen Re-Evaluation:   Oxygen Discharge (Final Oxygen Re-Evaluation):   Initial Exercise Prescription: Initial Exercise Prescription - 02/11/19 1200      Date of Initial Exercise RX and Referring Provider   Date  02/11/19    Referring Provider  JD      NuStep   Level  2    SPM  60    Minutes  15    METs  2      Recumbant Elliptical   Level  1    RPM  30    Watts  20    Minutes  15    METs  2      REL-XR   Level  1    Watts  20    Speed  50    Minutes  15    METs  2      T5 Nustep   Level  1    SPM  50    Minutes  15    METs  2      Prescription Details   Frequency (times per week)  3  Duration  Progress to 30 minutes of continuous aerobic without signs/symptoms of physical distress      Intensity   THRR 40-80% of Max Heartrate  103-134    Ratings of Perceived Exertion  11-15    Perceived Dyspnea  0-4      Progression   Progression  Continue progressive overload as per policy without signs/symptoms or physical distress.       Resistance Training   Training Prescription  Yes    Weight  3    Reps  10-15       Perform Capillary Blood Glucose checks as needed.  Exercise Prescription Changes: Exercise Prescription Changes    Row Name 02/11/19 1200 02/28/19 1400 04/03/19 1300 04/24/19 1100 05/15/19 1100     Response to Exercise   Blood Pressure (Admit)  102/60  100/62  118/60  120/66  104/62   Blood Pressure (Exercise)  122/74  142/80  128/70  128/60  146/70   Blood Pressure (Exit)  102/64  102/62  122/70  122/70  126/60   Heart Rate (Admit)  72 bpm  88 bpm  53 bpm  68 bpm  71 bpm   Heart Rate (Exercise)  85 bpm  129 bpm  89 bpm  107 bpm  110 bpm   Heart Rate (Exit)  63 bpm  117 bpm  61 bpm  71 bpm  84 bpm   Rating of Perceived Exertion (Exercise)  '11  13  12  12  13   '$ Symptoms  no  none  none  none  none   Duration  Progress to 30 minutes of  aerobic without signs/symptoms of physical distress  Continue with 30 min of aerobic exercise without signs/symptoms of physical distress.  Continue with 30 min of aerobic exercise without signs/symptoms of physical distress.  Continue with 30 min of aerobic exercise without signs/symptoms of physical distress.  Continue with 30 min of aerobic exercise without signs/symptoms of physical distress.   Intensity  THRR unchanged  THRR unchanged  THRR unchanged  THRR unchanged  THRR unchanged     Progression   Progression  -  Continue to progress workloads to maintain intensity without signs/symptoms of physical distress.  Continue to progress workloads to maintain intensity without signs/symptoms of physical distress.  Continue to progress workloads to maintain intensity without signs/symptoms of physical distress.  Continue to progress workloads to maintain intensity without signs/symptoms of physical distress.   Average METs  -  2.07  2.5  2.17  2     Resistance Training   Training Prescription  -  Yes  Yes  Yes  Yes   Weight  -  3 lbs  3 lb  3 lbs  3 lb   Reps  -  10-15   10-15  10-15  10-15     Interval Training   Interval Training  -  No  No  No  No     NuStep   Level  -  3  -  3  -   Minutes  -  15  -  15  -   METs  -  2.7  -  3  -     Recumbant Elliptical   Level  -  1  -  1  -   Minutes  -  15  -  15  -   METs  -  1.4  -  1.5  -     REL-XR   Level  -  1  -  -  1   Minutes  -  15  -  -  15   METs  -  2.2  -  -  -     T5 Nustep   Level  -  1  -  -  3   SPM  -  -  -  -  50   Minutes  -  15  -  -  15   METs  -  2  -  -  1.9     Home Exercise Plan   Plans to continue exercise at  -  -  -  Home (comment) walking  Home (comment) walking   Frequency  -  -  -  Add 2 additional days to program exercise sessions.  Add 2 additional days to program exercise sessions.   Initial Home Exercises Provided  -  -  -  03/11/19  03/11/19      Exercise Comments: Exercise Comments    Row Name 02/13/19 1034           Exercise Comments  First full day of exercise!  Patient was oriented to gym and equipment including functions, settings, policies, and procedures.  Patient's individual exercise prescription and treatment plan were reviewed.  All starting workloads were established based on the results of the 6 minute walk test done at initial orientation visit.  The plan for exercise progression was also introduced and progression will be customized based on patient's performance and goals.          Exercise Goals and Review: Exercise Goals    Row Name 02/11/19 1241             Exercise Goals   Increase Physical Activity  Yes       Intervention  Provide advice, education, support and counseling about physical activity/exercise needs.;Develop an individualized exercise prescription for aerobic and resistive training based on initial evaluation findings, risk stratification, comorbidities and participant's personal goals.       Expected Outcomes  Short Term: Attend rehab on a regular basis to increase amount of physical activity.;Long Term: Add in home  exercise to make exercise part of routine and to increase amount of physical activity.;Long Term: Exercising regularly at least 3-5 days a week.       Increase Strength and Stamina  Yes       Intervention  Provide advice, education, support and counseling about physical activity/exercise needs.;Develop an individualized exercise prescription for aerobic and resistive training based on initial evaluation findings, risk stratification, comorbidities and participant's personal goals.       Expected Outcomes  Short Term: Increase workloads from initial exercise prescription for resistance, speed, and METs.;Short Term: Perform resistance training exercises routinely during rehab and add in resistance training at home;Long Term: Improve cardiorespiratory fitness, muscular endurance and strength as measured by increased METs and functional capacity (6MWT)       Able to understand and use rate of perceived exertion (RPE) scale  Yes       Intervention  Provide education and explanation on how to use RPE scale       Expected Outcomes  Short Term: Able to use RPE daily in rehab to express subjective intensity level;Long Term:  Able to use RPE to guide intensity level when exercising independently       Able to understand and use Dyspnea scale  Yes       Intervention  Provide education and explanation on  how to use Dyspnea scale       Expected Outcomes  Short Term: Able to use Dyspnea scale daily in rehab to express subjective sense of shortness of breath during exertion;Long Term: Able to use Dyspnea scale to guide intensity level when exercising independently       Knowledge and understanding of Target Heart Rate Range (THRR)  Yes       Intervention  Provide education and explanation of THRR including how the numbers were predicted and where they are located for reference       Expected Outcomes  Short Term: Able to state/look up THRR;Short Term: Able to use daily as guideline for intensity in rehab;Long Term:  Able to use THRR to govern intensity when exercising independently       Able to check pulse independently  Yes       Intervention  Provide education and demonstration on how to check pulse in carotid and radial arteries.;Review the importance of being able to check your own pulse for safety during independent exercise       Expected Outcomes  Short Term: Able to explain why pulse checking is important during independent exercise       Understanding of Exercise Prescription  Yes       Intervention  Provide education, explanation, and written materials on patient's individual exercise prescription       Expected Outcomes  Short Term: Able to explain program exercise prescription;Long Term: Able to explain home exercise prescription to exercise independently          Exercise Goals Re-Evaluation : Exercise Goals Re-Evaluation    Row Name 02/13/19 1035 02/28/19 1404 03/05/19 1640 03/11/19 1101 03/13/19 1115     Exercise Goal Re-Evaluation   Exercise Goals Review  Increase Physical Activity;Increase Strength and Stamina;Able to understand and use rate of perceived exertion (RPE) scale;Knowledge and understanding of Target Heart Rate Range (THRR);Understanding of Exercise Prescription  Increase Physical Activity;Increase Strength and Stamina;Understanding of Exercise Prescription  -  Increase Physical Activity;Increase Strength and Stamina;Able to understand and use rate of perceived exertion (RPE) scale;Able to check pulse independently;Understanding of Exercise Prescription  Increase Physical Activity;Increase Strength and Stamina;Understanding of Exercise Prescription   Comments  Reviewed RPE scale, THR and program prescription with pt today.  Pt voiced understanding and was given a copy of goals to take home.  Lonia is off to a good start in rehab. She is already up to level 3 on the NuStep.  We will continue to monitor her progress and review home exercise guidelines.  out since last review  Reviewed  home exercise with pt today.  Pt plans to walk and use videos for exercise.  Reviewed THR, pulse, RPE, sign and symptoms, NTG use, and when to call 911 or MD.  Also discussed weather considerations and indoor options.  Pt voiced understanding.  Gissel is doing well in rehab.  She is doing some of her exercise at home as she has been caring for her husband after a fall.  She is feeling stronger overall.   Expected Outcomes  Short: Use RPE daily to regulate intensity. Long: Follow program prescription in THR.  Short: Review home exercise.  Long: Continue to follow program prescription.  -  Short - add 1 day of exercise per week at home Long - exercise on her own  Short: Exercise more at home.  Long: Continue to increase strength and stamina.   [place] Name 03/25/19 1130 04/03/19 1320 04/23/19 1536 04/24/19 1046  05/15/19 1152     Exercise Goal Re-Evaluation   Exercise Goals Review  Increase Physical Activity;Increase Strength and Stamina  Increase Physical Activity;Increase Strength and Stamina;Able to understand and use rate of perceived exertion (RPE) scale;Knowledge and understanding of Target Heart Rate Range (THRR);Able to check pulse independently;Understanding of Exercise Prescription  Increase Physical Activity;Increase Strength and Stamina;Understanding of Exercise Prescription  Increase Physical Activity;Increase Strength and Stamina  Increase Physical Activity;Increase Strength and Stamina;Able to understand and use rate of perceived exertion (RPE) scale;Knowledge and understanding of Target Heart Rate Range (THRR);Able to check pulse independently;Understanding of Exercise Prescription   Comments  Patient states that she does not exercise at home. She states that she is just lazy. Informed her that is is important for her to exercise after she finishes the program. She states her husband got hurt at work and has to take care of him at the moment. Patient has a good street that she can walk on and is going to  add walking one day a week.  Felissa Blouch has been doing well in rehab. She is now [place] on the NuStep as it's location in gym makes her cold.  She has been able to improve.  We will continue to monitor her progress.  Carnelia is going to walk at the park when she is done with rehab. She is going to walk at least two days a week. She is coming to class regularly and is enjoying class. She would like to get more stamina and strength while she is in the program.  Destenie has been consistent in attending rehab.   Expected Outcomes  Short: walk one day a week outside. Long: maintain an exercise routine after [facility].  -  Short: Walk more on off days.  Long: Continue to exericse independently  Short: turn up workloads on machines. Long: maintain level increases.  -   [place] Name 05/15/19 1201             Exercise Goal Re-Evaluation   Exercise Goals Review  Increase Physical Activity;Increase Strength and Stamina;Able to understand and use rate of perceived exertion (RPE) scale;Knowledge and understanding of Target Heart Rate Range (THRR);Able to check pulse independently;Understanding of Exercise Prescription       Comments  Staff wil encourage her to increase workoads and increase weight for strength work.       Expected Outcomes  Short - increase workloads Long - maintain fitness on her own          Discharge Exercise Prescription (Final Exercise Prescription Changes): Exercise Prescription Changes - 05/15/19 1100      Response to Exercise   Blood Pressure (Admit)  104/62    Blood Pressure (Exercise)  146/70    Blood Pressure (Exit)  126/60    Heart Rate (Admit)  71 bpm    Heart Rate (Exercise)  110 bpm    Heart Rate (Exit)  84 bpm    Rating of Perceived Exertion (Exercise)  13    Symptoms  none    Duration  Continue with 30 min of aerobic exercise without signs/symptoms of physical distress.    Intensity  THRR unchanged      Progression   Progression  Continue to progress workloads to maintain  intensity without signs/symptoms of physical distress.    Average METs  2      Resistance Training   Training Prescription  Yes    Weight  3 lb    Reps  10-15  Interval Training   Interval Training  No      REL-XR   Level  1    Minutes  15      T5 Nustep   Level  3    SPM  50    Minutes  15    METs  1.9      Home Exercise Plan   Plans to continue exercise at  Home (comment)   walking   Frequency  Add 2 additional days to program exercise sessions.    Initial Home Exercises Provided  03/11/19       Nutrition:  Target Goals: Understanding of nutrition guidelines, daily intake of sodium '1500mg'$ , cholesterol '200mg'$ , calories 30% from fat and 7% or less from saturated fats, daily to have 5 or more servings of fruits and vegetables.  Biometrics: Pre Biometrics - 02/11/19 1230      Pre Biometrics   Height  '5\' 2"'$  (1.575 m)    Weight  140 lb 6.4 oz (63.7 kg)    BMI (Calculated)  25.67    Single Leg Stand  20.02 seconds        Nutrition Therapy Plan and Nutrition Goals: Nutrition Therapy & Goals - 02/11/19 1155      Nutrition Therapy   Diet  HH, DM diet    Protein (specify units)  50g    Fiber  25 grams    Whole Grain Foods  3 servings    Saturated Fats  12 max. grams    Fruits and Vegetables  5 servings/day    Sodium  1.5 grams      Personal Nutrition Goals   Nutrition Goal  ST: try to add soluble fiber and reduce fatty food intake to manage diarrhea caused by medications LT: gain strength    Comments  Pt with Addisons disease, pt reports trying to eat enough sodium. Discussed HH and DM eating and diarrhea managemnet as she reports her medication can trigger diarrhea. Pt reports eating bagel with cream cheese (whole wheat), coffee with 2% milk and sweetener. tomato sandwich on whole wheat for lunch, and meat with 2 vegetables for dinner. (pork chops or fish (fried), chicken thighs without skin roasted)(corn, beans, peas, potatoes) pt reports also having fruit like  melons (all different kinds). Pt does not tolerate bananas well.      Intervention Plan   Intervention  Prescribe, educate and counsel regarding individualized specific dietary modifications aiming towards targeted core components such as weight, hypertension, lipid management, diabetes, heart failure and other comorbidities.;Nutrition handout(s) given to patient.    Expected Outcomes  Short Term Goal: Understand basic principles of dietary content, such as calories, fat, sodium, cholesterol and nutrients.;Short Term Goal: A plan has been developed with personal nutrition goals set during dietitian appointment.;Long Term Goal: Adherence to prescribed nutrition plan.       Nutrition Assessments:   Nutrition Goals Re-Evaluation: Nutrition Goals Re-Evaluation    [place] Name 03/11/19 1132 04/03/19 1110 04/29/19 1135         Goals   Nutrition Goal  ST: try to add soluble fiber and reduce fatty food intake to manage diarrhea caused by medications LT: gain strength  ST: Try to add snacks and eat frequent meals, pair meals with protein, fat, and fiber LT: gain strength  ST: Try to add snacks and eat frequent meals, pair meals with protein, fat, and fiber LT: gain strength, manage BG     Comment  Pt reports still has bad diarrhea since we last  spoke a month ago, again encouraged her to speak to her doctor (pt thinks its her medicaitons), pt stays away from insoluble fiber due to diarrhea exacerbation - encouraged simple foods that her body can tolerate to help symptoms. Expresssed concern with dehydration or malnutrition with continued diarrhea.  Discussed new insulin and nutrition, hypo and hyper glycemia. Pt now on long-lasting insulin. No more diarrhea due to medications.  Pt reports her doctor lowered her insulin, pt reports struggling with adding snacks and eating frequenlty and asked for some suggestions; gave pt some examples of how to pair CHO and protein/fat. Pt was receptive.     Expected Outcome   Pt will contact doctor regarding diarrhea and diahhrea will subside.  ST: Try to add snacks and eat frequent meals, pair meals with protein, fat, and fiber LT: gain strength  ST: Try to add snacks and eat frequent meals, pair meals with protein, fat, and fiber LT: gain strength, manage BG        Nutrition Goals Discharge (Final Nutrition Goals Re-Evaluation): Nutrition Goals Re-Evaluation - 04/29/19 1135      Goals   Nutrition Goal  ST: Try to add snacks and eat frequent meals, pair meals with protein, fat, and fiber LT: gain strength, manage BG    Comment  Pt reports her doctor lowered her insulin, pt reports struggling with adding snacks and eating frequenlty and asked for some suggestions; gave pt some examples of how to pair CHO and protein/fat. Pt was receptive.    Expected Outcome  ST: Try to add snacks and eat frequent meals, pair meals with protein, fat, and fiber LT: gain strength, manage BG       Psychosocial: Target Goals: Acknowledge presence or absence of significant depression and/or stress, maximize coping skills, provide positive support system. Participant is able to verbalize types and ability to use techniques and skills needed for reducing stress and depression.   Initial Review & Psychosocial Screening: Initial Psych Review & Screening - 02/07/19 1220      Initial Review   Current issues with  Current Stress Concerns    Source of Stress Concerns  Retirement/disability;Family    Comments  Always worked and had to quit work. Not having anything to do.  COVID 19 restricting ability to socialize.   Children making sure they are carede for if something happens to her.      Family Dynamics   Good Support System?  Yes   Husband, Family, neighbors/friends and church     Barriers   Psychosocial barriers to participate in program  There are no identifiable barriers or psychosocial needs.;The patient should benefit from training in stress management and relaxation.       Screening Interventions   Interventions  Encouraged to exercise;Provide feedback about the scores to participant;To provide support and resources with identified psychosocial needs    Expected Outcomes  Short Term goal: Utilizing psychosocial counselor, staff and physician to assist with identification of specific Stressors or current issues interfering with healing process. Setting desired goal for each stressor or current issue identified.;Long Term Goal: Stressors or current issues are controlled or eliminated.;Short Term goal: Identification and review with participant of any Quality of Life or Depression concerns found by scoring the questionnaire.;Long Term goal: The participant improves quality of Life and PHQ9 Scores as seen by post scores and/or verbalization of changes       Quality of Life Scores:   Scores of 19 and below usually indicate a poorer quality of  life in these areas.  A difference of  2-3 points is a clinically meaningful difference.  A difference of 2-3 points in the total score of the Quality of Life Index has been associated with significant improvement in overall quality of life, self-image, physical symptoms, and general health in studies assessing change in quality of life.  PHQ-9: Recent Review Flowsheet Data    Depression screen [facility] 2/9 03/29/2019 12/10/2018 07/12/2018 07/10/2017 07/08/2016   Decreased Interest 0 0 1 0 0   Down, Depressed, Hopeless 1 1 0 0 0   PHQ - 2 Score '1 1 1 '$ 0 0     Interpretation of Total Score  Total Score Depression Severity:  1-4 = Minimal depression, 5-9 = Mild depression, 10-14 = Moderate depression, 15-19 = Moderately severe depression, 20-27 = Severe depression   Psychosocial Evaluation and Intervention:   Psychosocial Re-Evaluation: Psychosocial Re-Evaluation    [place] Name 03/11/19 1105 03/13/19 1116 03/25/19 1133 04/24/19 1050       Psychosocial Re-Evaluation   Current issues with  Current Stress Concerns  Current Stress Concerns   Current Stress Concerns  Current Stress Concerns    Comments  Fredi spoke with her Dr about stress - she has been really upset since she stopped breathing.  Her Dr suggested counseling.  Cristy is doing okay.  Her husband fell and broke his knee cap and she has been caring for him.  He is making her crazy.  Her doctor thinks she may PTSD.  She talked with someone this morning and was given a reference for someone to meet with.,  Her husband fell at work and she is taking care of him. She wants to travel but her children have special needs and is not able to. She has to take her son to work since he cannot drive. Sometimes she wonders how and why she is still alive after her code blue. She is mostly positive and is trying to stay active.  Her husband is back at work from hurting his leg. Tanica has been less stressed now that her husband is at work. He likes to stay busy and she is happy that he is able to work and be happy. They get along very well and she just doesnt like to see him bored.    Expected Outcomes  Short - staff will have counselor call Vivianne Master will meet with counselor as needed  Short: Reach out to reference counselor. Long: Continue to seek self care.  Short: attend [facility] regularly. Long: Maintain exercise to decrease stress levels.  Short: workout regularly to reduce stress. Long: maintain an exercise routine to keep stress at a minimum.    Interventions  Encouraged to attend Cardiac Rehabilitation for the exercise  Encouraged to attend Cardiac Rehabilitation for the exercise  Encouraged to attend Cardiac Rehabilitation for the exercise  Encouraged to attend Cardiac Rehabilitation for the exercise    Continue Psychosocial Services   -  Follow up required by staff  Follow up required by staff  Follow up required by staff       Psychosocial Discharge (Final Psychosocial Re-Evaluation): Psychosocial Re-Evaluation - 04/24/19 1050      Psychosocial Re-Evaluation   Current issues with   Current Stress Concerns    Comments  Her husband is back at work from hurting his leg. Maye has been less stressed now that her husband is at work. He likes to stay busy and she is happy that he is able to work  and be happy. They get along very well and she just doesnt like to see him bored.    Expected Outcomes  Short: workout regularly to reduce stress. Long: maintain an exercise routine to keep stress at a minimum.    Interventions  Encouraged to attend Cardiac Rehabilitation for the exercise    Continue Psychosocial Services   Follow up required by staff       Vocational Rehabilitation: Provide vocational rehab assistance to qualifying candidates.   Vocational Rehab Evaluation & Intervention: Vocational Rehab - 02/07/19 1224      Initial Vocational Rehab Evaluation & Intervention   Assessment shows need for Vocational Rehabilitation  No       Education: Education Goals: Education classes will be provided on a variety of topics geared toward better understanding of heart health and risk factor modification. Participant will state understanding/return demonstration of topics presented as noted by education test scores.  Learning Barriers/Preferences: Learning Barriers/Preferences - 02/07/19 1224      Learning Barriers/Preferences   Learning Barriers  None    Learning Preferences  None       Education Topics:  AED/CPR: - Group verbal and written instruction with the use of models to demonstrate the basic use of the AED with the basic ABC's of resuscitation.   General Nutrition Guidelines/Fats and Fiber: -Group instruction provided by verbal, written material, models and posters to present the general guidelines for heart healthy nutrition. Gives an explanation and review of dietary fats and fiber.   Controlling Sodium/Reading Food Labels: -Group verbal and written material supporting the discussion of sodium use in heart healthy nutrition. Review and explanation with models,  verbal and written materials for utilization of the food label.   Exercise Physiology & General Exercise Guidelines: - Group verbal and written instruction with models to review the exercise physiology of the cardiovascular system and associated critical values. Provides general exercise guidelines with specific guidelines to those with heart or lung disease.    Aerobic Exercise & Resistance Training: - Gives group verbal and written instruction on the various components of exercise. Focuses on aerobic and resistive training programs and the benefits of this training and how to safely progress through these programs..   Cardiac Rehab from 05/15/2019 in [facility] Cardiac and Pulmonary Rehab  Date  05/15/19  Educator  AS  Instruction Review Code  1- Verbalizes Understanding      Flexibility, Balance, Mind/Body Relaxation: Provides group verbal/written instruction on the benefits of flexibility and balance training, including mind/body exercise modes such as yoga, pilates and tai chi.  Demonstration and skill practice provided.   Stress and Anxiety: - Provides group verbal and written instruction about the health risks of elevated stress and causes of high stress.  Discuss the correlation between heart/lung disease and anxiety and treatment options. Review healthy ways to manage with stress and anxiety.   Depression: - Provides group verbal and written instruction on the correlation between heart/lung disease and depressed mood, treatment options, and the stigmas associated with seeking treatment.   Anatomy & Physiology of the Heart: - Group verbal and written instruction and models provide basic cardiac anatomy and physiology, with the coronary electrical and arterial systems. Review of Valvular disease and Heart Failure   Cardiac Procedures: - Group verbal and written instruction to review commonly prescribed medications for heart disease. Reviews the medication, class of the drug, and side  effects. Includes the steps to properly store meds and maintain the prescription regimen. (beta blockers and nitrates)  Cardiac Rehab from 05/15/2019 in [facility] Cardiac and Pulmonary Rehab  Date  05/15/19  Educator  SB  Instruction Review Code  1- Verbalizes Understanding      Cardiac Medications I: - Group verbal and written instruction to review commonly prescribed medications for heart disease. Reviews the medication, class of the drug, and side effects. Includes the steps to properly store meds and maintain the prescription regimen.   Cardiac Medications II: -Group verbal and written instruction to review commonly prescribed medications for heart disease. Reviews the medication, class of the drug, and side effects. (all other drug classes)    Go Sex-Intimacy & Heart Disease, Get SMART - Goal Setting: - Group verbal and written instruction through game format to discuss heart disease and the return to sexual intimacy. Provides group verbal and written material to discuss and apply goal setting through the application of the S.M.A.R.T. Method.   Cardiac Rehab from 05/15/2019 in [facility] Cardiac and Pulmonary Rehab  Date  05/15/19  Educator  SB  Instruction Review Code  1- Verbalizes Understanding      Other Matters of the Heart: - Provides group verbal, written materials and models to describe Stable Angina and Peripheral Artery. Includes description of the disease process and treatment options available to the cardiac patient.   Exercise & Equipment Safety: - Individual verbal instruction and demonstration of equipment use and safety with use of the equipment.   Cardiac Rehab from 02/11/2019 in [facility] Cardiac and Pulmonary Rehab  Date  02/11/19  Educator  [place]  Instruction Review Code  1- Verbalizes Understanding      Infection Prevention: - Provides verbal and written material to individual with discussion of infection control including proper hand washing and proper equipment cleaning  during exercise session.   Cardiac Rehab from 02/11/2019 in [facility] Cardiac and Pulmonary Rehab  Date  02/11/19  Educator  [place]  Instruction Review Code  1- Verbalizes Understanding      Falls Prevention: - Provides verbal and written material to individual with discussion of falls prevention and safety.   Cardiac Rehab from 02/11/2019 in [facility] Cardiac and Pulmonary Rehab  Date  02/11/19  Educator  [place]  Instruction Review Code  1- Verbalizes Understanding      Diabetes: - Individual verbal and written instruction to review signs/symptoms of diabetes, desired ranges of glucose level fasting, after meals and with exercise. Acknowledge that pre and post exercise glucose checks will be done for 3 sessions at entry of program.   Cardiac Rehab from 02/11/2019 in [facility] Cardiac and Pulmonary Rehab  Date  02/11/19  Educator  [place]  Instruction Review Code  1- Verbalizes Understanding      Know Your Numbers and Risk Factors: -Group verbal and written instruction about important numbers in your health.  Discussion of what are risk factors and how they play a role in the disease process.  Review of Cholesterol, Blood Pressure, Diabetes, and BMI and the role they play in your overall health.   Sleep Hygiene: -Provides group verbal and written instruction about how sleep can affect your health.  Define sleep hygiene, discuss sleep cycles and impact of sleep habits. Review good sleep hygiene tips.    Other: -Provides group and verbal instruction on various topics (see comments)   Knowledge Questionnaire Score: Knowledge Questionnaire Score - 02/11/19 0909      Knowledge Questionnaire Score   Pre Score  18/26  Missed Nutrition, Angina and Exercise questions       Core Components/Risk Factors/Patient  Goals at Admission: Personal Goals and Risk Factors at Admission - 02/07/19 1225      Core Components/Risk Factors/Patient Goals on Admission    Weight Management  Yes;Weight Maintenance    Intervention   Weight Management: Develop a combined nutrition and exercise program designed to reach desired caloric intake, while maintaining appropriate intake of nutrient and fiber, sodium and fats, and appropriate energy expenditure required for the weight goal.;Weight Management: Provide education and appropriate resources to help participant work on and attain dietary goals.    Expected Outcomes  Short Term: Continue to assess and modify interventions until short term weight is achieved;Long Term: Adherence to nutrition and physical activity/exercise program aimed toward attainment of established weight goal    Diabetes  Yes    Intervention  Provide education about signs/symptoms and action to take for hypo/hyperglycemia.;Provide education about proper nutrition, including hydration, and aerobic/resistive exercise prescription along with prescribed medications to achieve blood glucose in normal ranges: Fasting glucose 65-99 mg/dL    Expected Outcomes  Short Term: Participant verbalizes understanding of the signs/symptoms and immediate care of hyper/hypoglycemia, proper foot care and importance of medication, aerobic/resistive exercise and nutrition plan for blood glucose control.;Long Term: Attainment of HbA1C < 7%.    Hypertension  Yes    Intervention  Provide education on lifestyle modifcations including regular physical activity/exercise, weight management, moderate sodium restriction and increased consumption of fresh fruit, vegetables, and low fat dairy, alcohol moderation, and smoking cessation.;Monitor prescription use compliance.    Expected Outcomes  Short Term: Continued assessment and intervention until BP is < 140/83m HG in hypertensive participants. < 130/880mHG in hypertensive participants with diabetes, heart failure or chronic kidney disease.;Long Term: Maintenance of blood pressure at goal levels.    Lipids  Yes    Intervention  Provide education and support for participant on nutrition &  aerobic/resistive exercise along with prescribed medications to achieve LDL '70mg'$ , HDL >'40mg'$ .    Expected Outcomes  Short Term: Participant states understanding of desired cholesterol values and is compliant with medications prescribed. Participant is following exercise prescription and nutrition guidelines.;Long Term: Cholesterol controlled with medications as prescribed, with individualized exercise RX and with personalized nutrition plan. Value goals: LDL < '70mg'$ , HDL > 40 mg.       Core Components/Risk Factors/Patient Goals Review:  Goals and Risk Factor Review    Row Name 03/11/19 1102 03/11/19 1110 03/25/19 1136 04/24/19 1052       Core Components/Risk Factors/Patient Goals Review   Personal Goals Review  Weight Management/Obesity;Hypertension  -  Weight Management/Obesity;Hypertension;Lipids;Diabetes  Weight Management/Obesity;Hypertension;Lipids;Diabetes    Review  Kays Dr added metoprolol to her medications - her HR got high last week.  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  Kays Dr added metoprolol to her medications - her HR got high last week.  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 She is taking her medications regularly and is managing her diabetes. She is checking her sugar first thing every morning. KaZanyias not checking her blood pressure at home. She has a machine but does not check it. Her oxygen is good she checks it at home and is always in the mid 90's. Her blood pressure in the  program has been 110/60s.  Patient has been maintaining her weight since the start of the program. Her blood pressure exercising has been good when exercising. She has been taking care of her diabetes and taking her medications regularly. She has gained some weight since she was in the  hospital now that her appitite is better.    Expected Outcomes  -  Short - keep checking BG at home and keep in close contact with your Dr/PA Long - keep BG and BP withon limits set by Dr  Short:Graduate from cardiac rehab. Long: maintain exercise and a healthy lifestyle post rehab.  Short: maintain current weight. Long: maintain weight independently.       Core Components/Risk Factors/Patient Goals at Discharge (Final Review):  Goals and Risk Factor Review - 04/24/19 1052      Core Components/Risk Factors/Patient Goals Review   Personal Goals Review  Weight Management/Obesity;Hypertension;Lipids;Diabetes    Review  Patient has been maintaining her weight since the start of the program. Her blood pressure exercising has been good when exercising. She has been taking care of her diabetes and taking her medications regularly. She has gained some weight since she was in the hospital now that her appitite is better.    Expected Outcomes  Short: maintain current weight. Long: maintain weight independently.       ITP Comments: ITP Comments    Row Name 02/07/19 1217 02/13/19 1035 02/20/19 0619 03/06/19 1125 03/20/19 0628   ITP Comments  Orientation completed virtually today   Appointment on Mon 8/3  ro EP and RD evals and gym orientation Documentation of diagnosis can be found in [facility] 08/13/18  First full day of exercise!  Patient was oriented to gym and equipment including functions, settings, policies, and procedures.  Patient's individual exercise prescription and treatment plan were reviewed.  All starting workloads were established based on the results of the 6 minute walk test done at initial orientation visit.  The plan for exercise progression was also introduced and progression will be customized based on patient's performance and goals.  30 Day Review Completed today. Continue with ITP unless changed by Medical Director review.  New to program  Called to check on pt.  She was cleared by cardiology  to return to rehab on 8/18 but she has not been back since then.  Left message.  30 Day review. Continue with ITP unless directed changes per Medical Director review.   [place] Name 03/29/19 1058 04/17/19 1124 05/15/19 0650 05/22/19 0927     ITP Comments  Pt stated she will be starting insulin next week. Will be checking BG before exercise.  30 day review completed. ITP sent to Dr. Emily Filbert, Medical Director of Cardiac and Pulmonary Rehab. Continue with ITP unless changes are made by physician.  Department closed starting 10/2 until further notice by infection prevention and Health at Work teams for COVID-19.  30 day review completed. Continue with ITP sent to Dr. Emily Filbert, Medical Director of Cardiac and Pulmonary Rehab for review , changes as needed and signature.  Zariah called today and wants to discharge as of today.       Comments: discharged per patient request

## 2019-05-22 NOTE — Progress Notes (Signed)
Discharge Progress Report  Patient Details  Name: Haley Young MRN: [id number] Date of Birth: Nov 21, 1950 Referring Provider:     Cardiac Rehab from 02/11/2019 in [facility]  Referring Provider  [place]       Number of Visits: 29/36  Reason for Discharge:  Early Exit:  Personal  Smoking History:  Social History   Tobacco Use  Smoking Status Never Smoker  Smokeless Tobacco Never Used    Diagnosis:  NSTEMI (non-ST elevation myocardial infarction) ([place])  ADL [facility]:   Initial Exercise Prescription: Initial Exercise Prescription - 02/11/19 1200      Date of Initial Exercise RX and Referring Provider   Date  02/11/19    Referring Provider  JD      NuStep   Level  2    SPM  60    Minutes  15    METs  2      Recumbant Elliptical   Level  1    RPM  30    Watts  20    Minutes  15    METs  2      REL-XR   Level  1    Watts  20    Speed  50    Minutes  15    METs  2      T5 Nustep   Level  1    SPM  50    Minutes  15    METs  2      Prescription Details   Frequency (times per week)  3    Duration  Progress to 30 minutes of continuous aerobic without signs/symptoms of physical distress      Intensity   THRR 40-80% of Max Heartrate  103-134    Ratings of Perceived Exertion  11-15    Perceived Dyspnea  0-4      Progression   Progression  Continue progressive overload as per policy without signs/symptoms or physical distress.      Resistance Training   Training Prescription  Yes    Weight  3    Reps  10-15       Discharge Exercise Prescription (Final Exercise Prescription Changes): Exercise Prescription Changes - 05/15/19 1100      Response to Exercise   Blood Pressure (Admit)  104/62    Blood Pressure (Exercise)  146/70    Blood Pressure (Exit)  126/60    Heart Rate (Admit)  71 bpm    Heart Rate (Exercise)  110 bpm    Heart Rate (Exit)  84 bpm    Rating of Perceived Exertion (Exercise)  13    Symptoms  none    Duration   Continue with 30 min of aerobic exercise without signs/symptoms of physical distress.    Intensity  THRR unchanged      Progression   Progression  Continue to progress workloads to maintain intensity without signs/symptoms of physical distress.    Average METs  2      Resistance Training   Training Prescription  Yes    Weight  3 lb    Reps  10-15      Interval Training   Interval Training  No      REL-XR   Level  1    Minutes  15      T5 Nustep   Level  3    SPM  50    Minutes  15    METs  1.9  Home Exercise Plan   Plans to continue exercise at  Home (comment)   walking   Frequency  Add 2 additional days to program exercise sessions.    Initial Home Exercises Provided  03/11/19       Functional Capacity: 6 Minute Walk    Row Name 02/11/19 1229         6 Minute Walk   Phase  Initial     Distance  1003 feet     Walk Time  6 minutes     # of Rest Breaks  0     MPH  1.89     METS  2.05     RPE  11     VO2 Peak  7.18     Resting HR  72 bpm     Resting BP  102/60     Max Ex. HR  85 bpm     Max Ex. BP  122/74     2 Minute Post BP  102/64        Psychological, QOL, Others - Outcomes: PHQ 2/9: Depression screen [facility] 2/9 03/29/2019 12/10/2018 07/12/2018 07/10/2017 07/08/2016  Decreased Interest 0 0 1 0 0  Down, Depressed, Hopeless 1 1 0 0 0  PHQ - 2 Score 1 1 1  0 0  Altered sleeping - - - - -  Tired, decreased energy - - - - -  Change in appetite - - - - -  Feeling bad or failure about yourself  - - - - -  Trouble concentrating - - - - -  Moving slowly or fidgety/restless - - - - -  Suicidal thoughts - - - - -  PHQ-9 Score - - - - -  Difficult doing work/chores - - - - -  Some recent data might be hidden    Quality of Life:   Personal Goals: Goals established at orientation with interventions provided to work toward goal. Personal Goals and Risk Factors at Admission - 02/07/19 1225      Core Components/Risk Factors/Patient Goals on Admission     Weight Management  Yes;Weight Maintenance    Intervention  Weight Management: Develop a combined nutrition and exercise program designed to reach desired caloric intake, while maintaining appropriate intake of nutrient and fiber, sodium and fats, and appropriate energy expenditure required for the weight goal.;Weight Management: Provide education and appropriate resources to help participant work on and attain dietary goals.    Expected Outcomes  Short Term: Continue to assess and modify interventions until short term weight is achieved;Long Term: Adherence to nutrition and physical activity/exercise program aimed toward attainment of established weight goal    Diabetes  Yes    Intervention  Provide education about signs/symptoms and action to take for hypo/hyperglycemia.;Provide education about proper nutrition, including hydration, and aerobic/resistive exercise prescription along with prescribed medications to achieve blood glucose in normal ranges: Fasting glucose 65-99 mg/dL    Expected Outcomes  Short Term: Participant verbalizes understanding of the signs/symptoms and immediate care of hyper/hypoglycemia, proper foot care and importance of medication, aerobic/resistive exercise and nutrition plan for blood glucose control.;Long Term: Attainment of HbA1C < 7%.    Hypertension  Yes    Intervention  Provide education on lifestyle modifcations including regular physical activity/exercise, weight management, moderate sodium restriction and increased consumption of fresh fruit, vegetables, and low fat dairy, alcohol moderation, and smoking cessation.;Monitor prescription use compliance.    Expected Outcomes  Short Term: Continued assessment and intervention until BP is <  140/50mm HG in hypertensive participants. < 130/19mm HG in hypertensive participants with diabetes, heart failure or chronic kidney disease.;Long Term: Maintenance of blood pressure at goal levels.    Lipids  Yes    Intervention  Provide  education and support for participant on nutrition & aerobic/resistive exercise along with prescribed medications to achieve LDL 70mg , HDL >40mg .    Expected Outcomes  Short Term: Participant states understanding of desired cholesterol values and is compliant with medications prescribed. Participant is following exercise prescription and nutrition guidelines.;Long Term: Cholesterol controlled with medications as prescribed, with individualized exercise RX and with personalized nutrition plan. Value goals: LDL < 70mg , HDL > 40 mg.        Personal Goals Discharge: Goals and Risk Factor Review    Row Name 03/11/19 1102 03/11/19 1110 03/25/19 1136 04/24/19 1052       Core Components/Risk Factors/Patient Goals Review   Personal Goals Review  Weight Management/Obesity;Hypertension  -  Weight Management/Obesity;Hypertension;Lipids;Diabetes  Weight Management/Obesity;Hypertension;Lipids;Diabetes    Review  Kays Dr added metoprolol to her medications - her HR got high last week.  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  Kays Dr added metoprolol to her medications - her HR got high last week.  She has a machine at home to send her info to manage pacemaker.She checks BG at home - it runs around 155 - her Dr is ok with it being up to 180 due to taking steroids for Addisons disease  Mariangel has been maintaing her weight and is doing well. She is taking her medications regularly and is managing her diabetes. She is checking her sugar first thing every morning. [place] is not checking her blood pressure at home. She has a machine but does not check it. Her oxygen is good she checks it at home and is always in the mid 90's. Her blood pressure in the program has been 110/60s.  Patient has been maintaining her weight since the start of the program. Her blood pressure exercising has been good when exercising. She has been taking care of her diabetes and taking her medications regularly. She has gained some  weight since she was in the hospital now that her appitite is better.    Expected Outcomes  -  Short - keep checking BG at home and keep in close contact with your Dr/PA Long - keep BG and BP withon limits set by Dr  Short:Graduate from cardiac rehab. Long: maintain exercise and a healthy lifestyle post rehab.  Short: maintain current weight. Long: maintain weight independently.       Exercise Goals and Review: Exercise Goals    Row Name 02/11/19 1241             Exercise Goals   Increase Physical Activity  Yes       Intervention  Provide advice, education, support and counseling about physical activity/exercise needs.;Develop an individualized exercise prescription for aerobic and resistive training based on initial evaluation findings, risk stratification, comorbidities and participant's personal goals.       Expected Outcomes  Short Term: Attend rehab on a regular basis to increase amount of physical activity.;Long Term: Add in home exercise to make exercise part of routine and to increase amount of physical activity.;Long Term: Exercising regularly at least 3-5 days a week.       Increase Strength and Stamina  Yes       Intervention  Provide advice, education, support and counseling about physical activity/exercise  needs.;Develop an individualized exercise prescription for aerobic and resistive training based on initial evaluation findings, risk stratification, comorbidities and participant's personal goals.       Expected Outcomes  Short Term: Increase workloads from initial exercise prescription for resistance, speed, and METs.;Short Term: Perform resistance training exercises routinely during rehab and add in resistance training at home;Long Term: Improve cardiorespiratory fitness, muscular endurance and strength as measured by increased METs and functional capacity (6MWT)       Able to understand and use rate of perceived exertion (RPE) scale  Yes       Intervention  Provide education  and explanation on how to use RPE scale       Expected Outcomes  Short Term: Able to use RPE daily in rehab to express subjective intensity level;Long Term:  Able to use RPE to guide intensity level when exercising independently       Able to understand and use Dyspnea scale  Yes       Intervention  Provide education and explanation on how to use Dyspnea scale       Expected Outcomes  Short Term: Able to use Dyspnea scale daily in rehab to express subjective sense of shortness of breath during exertion;Long Term: Able to use Dyspnea scale to guide intensity level when exercising independently       Knowledge and understanding of Target Heart Rate Range (THRR)  Yes       Intervention  Provide education and explanation of THRR including how the numbers were predicted and where they are located for reference       Expected Outcomes  Short Term: Able to state/look up THRR;Short Term: Able to use daily as guideline for intensity in rehab;Long Term: Able to use THRR to govern intensity when exercising independently       Able to check pulse independently  Yes       Intervention  Provide education and demonstration on how to check pulse in carotid and radial arteries.;Review the importance of being able to check your own pulse for safety during independent exercise       Expected Outcomes  Short Term: Able to explain why pulse checking is important during independent exercise       Understanding of Exercise Prescription  Yes       Intervention  Provide education, explanation, and written materials on patient's individual exercise prescription       Expected Outcomes  Short Term: Able to explain program exercise prescription;Long Term: Able to explain home exercise prescription to exercise independently          Exercise Goals Re-Evaluation: Exercise Goals Re-Evaluation    Row Name 02/13/19 1035 02/28/19 1404 03/05/19 1640 03/11/19 1101 03/13/19 1115     Exercise Goal Re-Evaluation   Exercise Goals  Review  Increase Physical Activity;Increase Strength and Stamina;Able to understand and use rate of perceived exertion (RPE) scale;Knowledge and understanding of Target Heart Rate Range (THRR);Understanding of Exercise Prescription  Increase Physical Activity;Increase Strength and Stamina;Understanding of Exercise Prescription  -  Increase Physical Activity;Increase Strength and Stamina;Able to understand and use rate of perceived exertion (RPE) scale;Able to check pulse independently;Understanding of Exercise Prescription  Increase Physical Activity;Increase Strength and Stamina;Understanding of Exercise Prescription   Comments  Reviewed RPE scale, THR and program prescription with pt today.  Pt voiced understanding and was given a copy of goals to take home.  Ranie is off to a good start in rehab. She is already up to level 3 on  the NuStep.  We will continue to monitor her progress and review home exercise guidelines.  out since last review  Reviewed home exercise with pt today.  Pt plans to walk and use videos for exercise.  Reviewed THR, pulse, RPE, sign and symptoms, NTG use, and when to call 911 or MD.  Also discussed weather considerations and indoor options.  Pt voiced understanding.  Marisa is doing well in rehab.  She is doing some of her exercise at home as she has been caring for her husband after a fall.  She is feeling stronger overall.   Expected Outcomes  Short: Use RPE daily to regulate intensity. Long: Follow program prescription in THR.  Short: Review home exercise.  Long: Continue to follow program prescription.  -  Short - add 1 day of exercise per week at home Long - exercise on her own  Short: Exercise more at home.  Long: Continue to increase strength and stamina.   [place] Name 03/25/19 1130 04/03/19 1320 04/23/19 1536 04/24/19 1046 05/15/19 1152     Exercise Goal Re-Evaluation   Exercise Goals Review  Increase Physical Activity;Increase Strength and Stamina  Increase Physical Activity;Increase  Strength and Stamina;Able to understand and use rate of perceived exertion (RPE) scale;Knowledge and understanding of Target Heart Rate Range (THRR);Able to check pulse independently;Understanding of Exercise Prescription  Increase Physical Activity;Increase Strength and Stamina;Understanding of Exercise Prescription  Increase Physical Activity;Increase Strength and Stamina  Increase Physical Activity;Increase Strength and Stamina;Able to understand and use rate of perceived exertion (RPE) scale;Knowledge and understanding of Target Heart Rate Range (THRR);Able to check pulse independently;Understanding of Exercise Prescription   Comments  Patient states that she does not exercise at home. She states that she is just lazy. Informed her that is is important for her to exercise after she finishes the program. She states her husband got hurt at work and has to take care of him at the moment. Patient has a good street that she can walk on and is going to add walking one day a week.  Samanntha Hoggatt has been doing well in rehab. She is now [place] on the NuStep as it's location in gym makes her cold.  She has been able to improve.  We will continue to monitor her progress.  Doreena is going to walk at the park when she is done with rehab. She is going to walk at least two days a week. She is coming to class regularly and is enjoying class. She would like to get more stamina and strength while she is in the program.  Nehal has been consistent in attending rehab.   Expected Outcomes  Short: walk one day a week outside. Long: maintain an exercise routine after [facility].  -  Short: Walk more on off days.  Long: Continue to exericse independently  Short: turn up workloads on machines. Long: maintain level increases.  -   [place] Name 05/15/19 1201             Exercise Goal Re-Evaluation   Exercise Goals Review  Increase Physical Activity;Increase Strength and Stamina;Able to understand and use rate of perceived exertion (RPE)  scale;Knowledge and understanding of Target Heart Rate Range (THRR);Able to check pulse independently;Understanding of Exercise Prescription       Comments  Staff wil encourage her to increase workoads and increase weight for strength work.       Expected Outcomes  Short - increase workloads Long - maintain fitness on her own  Nutrition & Weight - Outcomes: Pre Biometrics - 02/11/19 1230      Pre Biometrics   Height  5\' 2"  (1.575 m)    Weight  140 lb 6.4 oz (63.7 kg)    BMI (Calculated)  25.67    Single Leg Stand  20.02 seconds        Nutrition: Nutrition Therapy & Goals - 02/11/19 1155      Nutrition Therapy   Diet  HH, DM diet    Protein (specify units)  50g    Fiber  25 grams    Whole Grain Foods  3 servings    Saturated Fats  12 max. grams    Fruits and Vegetables  5 servings/day    Sodium  1.5 grams      Personal Nutrition Goals   Nutrition Goal  ST: try to add soluble fiber and reduce fatty food intake to manage diarrhea caused by medications LT: gain strength    Comments  Pt with Addisons disease, pt reports trying to eat enough sodium. Discussed HH and DM eating and diarrhea managemnet as she reports her medication can trigger diarrhea. Pt reports eating bagel with cream cheese (whole wheat), coffee with 2% milk and sweetener. tomato sandwich on whole wheat for lunch, and meat with 2 vegetables for dinner. (pork chops or fish (fried), chicken thighs without skin roasted)(corn, beans, peas, potatoes) pt reports also having fruit like melons (all different kinds). Pt does not tolerate bananas well.      Intervention Plan   Intervention  Prescribe, educate and counsel regarding individualized specific dietary modifications aiming towards targeted core components such as weight, hypertension, lipid management, diabetes, heart failure and other comorbidities.;Nutrition handout(s) given to patient.    Expected Outcomes  Short Term Goal: Understand basic principles of  dietary content, such as calories, fat, sodium, cholesterol and nutrients.;Short Term Goal: A plan has been developed with personal nutrition goals set during dietitian appointment.;Long Term Goal: Adherence to prescribed nutrition plan.       Nutrition Discharge:   Education Questionnaire Score: Knowledge Questionnaire Score - 02/11/19 0909      Knowledge Questionnaire Score   Pre Score  18/26  Missed Nutrition, Angina and Exercise questions       Goals reviewed with patient; copy given to patient.

## 2019-06-03 DIAGNOSIS — E271 Primary adrenocortical insufficiency: Secondary | ICD-10-CM | POA: Diagnosis not present

## 2019-06-03 DIAGNOSIS — M810 Age-related osteoporosis without current pathological fracture: Secondary | ICD-10-CM | POA: Diagnosis not present

## 2019-06-03 DIAGNOSIS — E038 Other specified hypothyroidism: Secondary | ICD-10-CM | POA: Diagnosis not present

## 2019-06-03 DIAGNOSIS — E1165 Type 2 diabetes mellitus with hyperglycemia: Secondary | ICD-10-CM | POA: Diagnosis not present

## 2019-06-03 DIAGNOSIS — E063 Autoimmune thyroiditis: Secondary | ICD-10-CM | POA: Diagnosis not present

## 2019-06-12 ENCOUNTER — Other Ambulatory Visit: Payer: Self-pay | Admitting: Internal Medicine

## 2019-06-12 DIAGNOSIS — Z1231 Encounter for screening mammogram for malignant neoplasm of breast: Secondary | ICD-10-CM

## 2019-06-20 ENCOUNTER — Ambulatory Visit
Admission: RE | Admit: 2019-06-20 | Discharge: 2019-06-20 | Disposition: A | Discharge status: Home / Self Care | Payer: PPO | Source: Ambulatory Visit | Attending: Internal Medicine | Admitting: Internal Medicine

## 2019-06-20 DIAGNOSIS — Z1231 Encounter for screening mammogram for malignant neoplasm of breast: Secondary | ICD-10-CM | POA: Insufficient documentation

## 2019-06-20 DIAGNOSIS — I4430 Unspecified atrioventricular block: Secondary | ICD-10-CM | POA: Diagnosis not present

## 2019-06-20 DIAGNOSIS — Z950 Presence of cardiac pacemaker: Secondary | ICD-10-CM | POA: Diagnosis not present

## 2019-06-20 DIAGNOSIS — Z45018 Encounter for adjustment and management of other part of cardiac pacemaker: Secondary | ICD-10-CM | POA: Diagnosis not present

## 2019-06-26 DIAGNOSIS — E119 Type 2 diabetes mellitus without complications: Secondary | ICD-10-CM | POA: Diagnosis not present

## 2019-06-26 LAB — HM DIABETES EYE EXAM

## 2019-07-10 DIAGNOSIS — E038 Other specified hypothyroidism: Secondary | ICD-10-CM | POA: Diagnosis not present

## 2019-07-10 DIAGNOSIS — E1165 Type 2 diabetes mellitus with hyperglycemia: Secondary | ICD-10-CM | POA: Diagnosis not present

## 2019-07-10 DIAGNOSIS — M810 Age-related osteoporosis without current pathological fracture: Secondary | ICD-10-CM | POA: Diagnosis not present

## 2019-07-10 DIAGNOSIS — E271 Primary adrenocortical insufficiency: Secondary | ICD-10-CM | POA: Diagnosis not present

## 2019-07-10 DIAGNOSIS — E063 Autoimmune thyroiditis: Secondary | ICD-10-CM | POA: Diagnosis not present

## 2019-07-15 ENCOUNTER — Other Ambulatory Visit: Payer: Self-pay

## 2019-07-15 ENCOUNTER — Ambulatory Visit: Payer: Medicare Other | Admitting: Internal Medicine

## 2019-07-15 ENCOUNTER — Ambulatory Visit (INDEPENDENT_AMBULATORY_CARE_PROVIDER_SITE_OTHER): Payer: PPO

## 2019-07-15 VITALS — Ht 61.0 in | Wt 140.0 lb

## 2019-07-15 DIAGNOSIS — Z0000 Encounter for general adult medical examination without abnormal findings: Secondary | ICD-10-CM | POA: Diagnosis not present

## 2019-07-15 NOTE — Progress Notes (Signed)
Subjective:   Haley Young is a 69 y.o. female who presents for Medicare Annual (Subsequent) preventive examination.  Review of Systems:  No ROS.  Medicare Wellness Virtual Visit.  Visual/audio telehealth visit, UTA vital signs.   Wt/Ht provided. See social history for additional risk factors.   Cardiac Risk Factors include: advanced age (>45men, >34 women);diabetes mellitus     Objective:     Vitals: Ht 5\' 1"  (1.549 m)   Wt 140 lb (63.5 kg)   BMI 26.45 kg/m   Body mass index is 26.45 kg/m.  Advanced Directives 07/15/2019 02/07/2019 12/10/2018 07/12/2018 04/23/2018 04/20/2018 04/19/2018  Does Patient Have a Medical Advance Directive? No No No No No No No  Type of Advance Directive - - - - - - -  Does patient want to make changes to medical advance directive? - - - - - - -  Would patient like information on creating a medical advance directive? No - Patient declined Yes (MAU/Ambulatory/Procedural Areas - Information given) No - Patient declined No - Patient declined No - Patient declined No - Patient declined No - Patient declined    Tobacco Social History   Tobacco Use  Smoking Status Never Smoker  Smokeless Tobacco Never Used     Counseling given: Not Answered   Clinical Intake:  Pre-visit preparation completed: Yes        Diabetes: Yes(Followed by [facility], Dr. Garrel Ridgel)  How often do you need to have someone help you when you read instructions, pamphlets, or other written materials from your doctor or pharmacy?: 1 - Never  Interpreter Needed?: No     Past Medical History:  Diagnosis Date  . Asthma   . Chicken pox   . Depression   . Diabetes mellitus   . Frequent UTI   . GERD (gastroesophageal reflux disease)   . Heart disease   . Heart murmur   . 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 Hx of colonic polyps   . Hyperhidrosis    especially with hot flashes    . Hyperlipidemia   . Lipoma    R axilla   . MRSA cellulitis     surgical wound infection  . Osteoporosis   . Pyelocystitis    as a child   . Thyroid disease   . Urinary incontinence    Past Surgical History:  Procedure Laterality Date  . ABDOMINAL HYSTERECTOMY  1970  . [place], 2005   pulmonary stenosis with valve replacement, Dr. Evelina Dun  . PARTIAL HYSTERECTOMY  1970  . PULMONARY VEIN STENOSIS REPAIR    . VALVE REPLACEMENT     Family History  Problem Relation Age of Onset  . Coarctation of the aorta Sister   . Diabetes Mother   . COPD Mother   . Obesity Mother   . Depression Mother   . Pancreatic cancer Mother   . Alcohol abuse Father   . Cirrhosis Father   . Down syndrome Brother   . Pneumonia Brother   . Down syndrome Daughter   . Lupus Daughter   . Ulcerative colitis Daughter   . Diabetes Maternal Aunt   . Breast cancer Maternal Aunt   . Breast cancer Maternal Aunt    Social History   Socioeconomic History  . Marital status: Married    Spouse name: Not on file  . Number of children: 2  . Years of education: Not on file  . Highest education  level: Not on file  Occupational History  . Occupation: Retired    Fish farm manager: RETIRED  . Occupation: Sales   Tobacco Use  . Smoking status: Never Smoker  . Smokeless tobacco: Never Used  Substance and Sexual Activity  . Alcohol use: Yes    Comment: occasional wine   . Drug use: No  . Sexual activity: Yes  Other Topics Concern  . Not on file  Social History Narrative   Divorced x 2      Sister is pt here- Surveyor, minerals      Cares for 41 year old mother      37 for daughter with Down's syndrome      Adopted son with Asberger's syndrome      G1P1      Cares for 2 disabled children    Social Determinants of Health   Financial Resource Strain: Low Risk   . Difficulty of Paying Living Expenses: Not hard at all  Food Insecurity:   . Worried About Charity fundraiser in the Last Year: Not on file  . Ran Out of Food in the Last Year: Not on file    Transportation Needs: No Transportation Needs  . Lack of Transportation (Medical): No  . Lack of Transportation (Non-Medical): No  Physical Activity: Insufficiently Active  . Days of Exercise per Week: 2 days  . Minutes of Exercise per Session: 20 min  Stress: No Stress Concern Present  . Feeling of Stress : Only a little  Social Connections: Slightly Isolated  . Frequency of Communication with Friends and Family: More than three times a week  . Frequency of Social Gatherings with Friends and Family: Never  . Attends Religious Services: Never  . Active Member of Clubs or Organizations: Yes  . Attends Archivist Meetings: Never  . Marital Status: Married    Outpatient Encounter Medications as of 07/15/2019  Medication Sig  . apixaban (ELIQUIS) 5 MG TABS tablet Take 1 tablet (5 mg total) by mouth 2 (two) times daily.  Marland Kitchen atorvastatin (LIPITOR) 40 MG tablet Take 40 mg by mouth daily.  . Fludrocortisone Acetate (FLORINEF PO) Take 0.1 mg by mouth daily.  Marland Kitchen FLUoxetine (PROZAC) 10 MG tablet Take 1 tablet (10 mg total) by mouth daily.  . furosemide (LASIX) 20 MG tablet Take 20 mg by mouth daily.   Marland Kitchen glipiZIDE (GLUCOTROL) 10 MG tablet Take 1 tablet (10 mg total) by mouth 2 (two) times daily before a meal.  . glucose blood (ONE TOUCH ULTRA TEST) test strip USE ONE STRIP TO CHECK GLUCOSE THREE TIMES DAILY AS DIRECTED E11.65  . hydrocortisone (CORTEF) 10 MG tablet Take 5 mg by mouth 2 (two) times daily. 10 mg QAM, 5 mg lunch, 5 PM  . hydrocortisone sodium succinate (SOLU-CORTEF) 100 MG SOLR injection Use 1 injection for adrenal crisis  . Insulin Glargine (LANTUS SOLOSTAR) 100 UNIT/ML Solostar Pen Inject 15 Units into the skin daily. ADJUST UPWARD AS NEEDED EVERY 3 DAYS  . Insulin Pen Needle 32G X 4 MM MISC Used to give daily insulin injections.  Marland Kitchen levothyroxine (SYNTHROID) 112 MCG tablet Take 1 tablet (112 mcg total) by mouth daily.  . metformin (FORTAMET) 1000 MG (OSM) 24 hr tablet Take  1,000 mg by mouth daily with breakfast.  . metoprolol succinate (TOPROL-XL) 25 MG 24 hr tablet Take by mouth.  . mirtazapine (REMERON) 7.5 MG tablet TAKE 1 TABLET BY MOUTH EVERYDAY AT BEDTIME  . Potassium Chloride ER 20 MEQ TBCR Take  1 tablet by mouth once daily  . Semaglutide, 1 MG/DOSE, (OZEMPIC, 1 MG/DOSE,) 2 MG/1.5ML SOPN Inject into the skin. Inject into stomach 1 time weekly  . traZODone (DESYREL) 150 MG tablet Take 1 tablet (150 mg total) by mouth at bedtime.   No facility-administered encounter medications on file as of 07/15/2019.    Activities of Daily Living In your present state of health, do you have any difficulty performing the following activities: 07/15/2019  Hearing? N  Vision? N  Difficulty concentrating or making decisions? N  Walking or climbing stairs? N  Dressing or bathing? N  Doing errands, shopping? N  Preparing Food and eating ? N  Using the Toilet? N  In the past six months, have you accidently leaked urine? N  Do you have problems with loss of bowel control? N  Managing your Medications? N  Managing your Finances? N  Housekeeping or managing your Housekeeping? N  Some recent data might be hidden    Patient Care Team: [name], MD as PCP - General (Internal Medicine)    Assessment:   This is a routine wellness examination for [facility].  Nurse connected with patient 07/15/19 at 10:30 AM EST by a telephone enabled telemedicine application and verified that I am speaking with the correct person using two identifiers. Patient stated full name and DOB. Patient gave permission to continue with virtual visit. Patient's location was at home and Nurse's location was at [place] office.   Patient is alert and oriented x3. Patient a little difficulty focusing or concentrating. Patient likes to read, play computer games and talks on the phone for brain stimulation.   Health Maintenance Due: -Tdap and Pneumonia vaccine- discussed; to be completed with doctor in  visit or local pharmacy.   -Foot Exam; followed by pcp and cardiologist for examinations. Next Cardiology appointment scheduled 07/26/19. -Hgb A1c- 05/02/19 (7.8) See completed HM at the end of note.   Eye: Visual acuity not assessed. Virtual visit. Followed by their ophthalmologist.Retinopathy- none reported.  Dental: UTD   Hearing: Demonstrates normal hearing during visit.  Safety:  Patient feels safe at home- yes Patient does have smoke detectors at home- yes Patient does wear sunscreen or protective clothing when in direct sunlight - yes Patient does wear seat belt when in a moving vehicle - yes Patient drives- yes Adequate lighting in walkways free from debris- yes Grab bars and handrails used as appropriate- yes Ambulates with an assistive device- no Cell phone on person when ambulating outside of the home- yes  Social: Alcohol intake - yes, occ     Smoking history- never   Smokers in home? none Illicit drug use? none  Medication: Taking as directed and without issues.  Self managed - yes   Covid-19: Precautions and sickness symptoms discussed. Wears mask, social distancing, hand hygiene as appropriate.   Activities of Daily Living Patient denies needing assistance with: household chores, feeding themselves, getting from bed to chair, getting to the toilet, bathing/showering, dressing, managing money, or preparing meals.   Discussed the importance of a healthy diet, water intake and the benefits of aerobic exercise.   Physical activity- walking, no routine.   Diet:  Low Carb Water: good intake Caffeine: 1 cup of coffee  Other Providers Patient Care Team: [name], MD as PCP - General (Internal Medicine)  Exercise Activities and Dietary recommendations Current Exercise Habits: Home exercise routine, Type of exercise: walking, Intensity: Mild  Goals      Patient Stated   .  A1c lower (pt-stated)     Followed by Endocrinologist. [facility].  Last A1c  9.5, Glucose 258       Fall Risk Fall Risk  07/15/2019 05/01/2019 03/29/2019 02/07/2019 07/12/2018  Falls in the past year? 0 0 0 0 0  Comment - - - - -  Number falls in past yr: - - 0 - -  Injury with Fall? - - 0 - -  Risk for fall due to : - - History of fall(s) - -  Follow up Falls prevention discussed Falls evaluation completed - - -   Timed Get Up and Go performed: no, virtual visit  Depression Screen PHQ 2/9 Scores 07/15/2019 03/29/2019 12/10/2018 07/12/2018  PHQ - 2 Score 1 1 1 1   PHQ- 9 Score - - - -     Cognitive Function MMSE - Mini Mental State Exam 07/10/2017 07/08/2016  Orientation to time 5 5  Orientation to Place 5 5  Registration 3 3  Attention/ Calculation 5 5  Recall 3 3  Language- name 2 objects 2 2  Language- repeat 1 1  Language- follow 3 step command 3 3  Language- read & follow direction 1 1  Write a sentence 1 1  Copy design 1 1  Total score 30 30     6CIT Screen 07/15/2019 07/12/2018  What Year? 0 points 0 points  What month? 0 points 0 points  What time? 0 points 0 points  Count back from 20 0 points 0 points  Months in reverse 2 points 0 points  Repeat phrase 0 points 0 points  Total Score 2 0    Immunization History  Administered Date(s) Administered  . H1N1 08/13/2008  . Influenza Split 07/28/2011, 06/28/2012  . Influenza, High Dose Seasonal PF 04/14/2016, 07/10/2017, 04/11/2018, 05/07/2019  . Influenza,inj,Quad PF,6+ Mos 07/25/2013, 08/21/2015  . Influenza-Unspecified 07/22/2015  . Pneumococcal Conjugate-13 09/13/2013  . Pneumococcal Polysaccharide-23 07/30/2011  . Zoster 09/13/2013  . Zoster Recombinat (Shingrix) 02/26/2019   Screening Tests Health Maintenance  Topic Date Due  . FOOT EXAM  07/26/2019 (Originally 09/14/2014)  . TETANUS/TDAP  07/14/2020 (Originally 07/25/1969)  . PNA vac Low Risk Adult (2 of 2 - PPSV23) 07/14/2020 (Originally 07/29/2016)  . HEMOGLOBIN A1C  10/31/2019  . URINE MICROALBUMIN  05/01/2020  . OPHTHALMOLOGY EXAM   06/25/2020  . MAMMOGRAM  06/19/2021  . COLONOSCOPY  09/14/2022  . INFLUENZA VACCINE  Completed  . DEXA SCAN  Completed  . Hepatitis C Screening  Completed      Plan:   Keep all routine maintenance appointments.   Medicare Attestation I have personally reviewed: The patient's medical and social history Their use of alcohol, tobacco or illicit drugs Their current medications and supplements The patient's functional ability including ADLs,fall risks, home safety risks, cognitive, and hearing and visual impairment Diet and physical activities Evidence for depression   I have reviewed and discussed with patient certain preventive protocols, quality metrics, and best practice recommendations.      Varney Biles, LPN  [id number]

## 2019-07-15 NOTE — Patient Instructions (Addendum)
  Haley Young , Thank you for taking time to come for your Medicare Wellness Visit. I appreciate your ongoing commitment to your health goals. Please review the following plan we discussed and let me know if I can assist you in the future.   These are the goals we discussed: Goals      Patient Stated   . A1c lower (pt-stated)     Followed by Endocrinologist. [facility].  Last A1c 9.5, Glucose 258       This is a list of the screening recommended for you and due dates:  Health Maintenance  Topic Date Due  . Complete foot exam   07/26/2019*  . Tetanus Vaccine  07/14/2020*  . Pneumonia vaccines (2 of 2 - PPSV23) 07/14/2020*  . Hemoglobin A1C  10/31/2019  . Urine Protein Check  05/01/2020  . Eye exam for diabetics  06/25/2020  . Mammogram  06/19/2021  . Colon Cancer Screening  09/14/2022  . Flu Shot  Completed  . DEXA scan (bone density measurement)  Completed  .  Hepatitis C: One time screening is recommended by Center for Disease Control  (CDC) for  adults born from [birth year] through 1967.   Completed  *Topic was postponed. The date shown is not the original due date.

## 2019-07-26 DIAGNOSIS — E782 Mixed hyperlipidemia: Secondary | ICD-10-CM | POA: Diagnosis not present

## 2019-07-26 DIAGNOSIS — Q221 Congenital pulmonary valve stenosis: Secondary | ICD-10-CM | POA: Diagnosis not present

## 2019-07-26 DIAGNOSIS — E271 Primary adrenocortical insufficiency: Secondary | ICD-10-CM | POA: Diagnosis not present

## 2019-07-26 DIAGNOSIS — I471 Supraventricular tachycardia: Secondary | ICD-10-CM | POA: Diagnosis not present

## 2019-07-26 DIAGNOSIS — I371 Nonrheumatic pulmonary valve insufficiency: Secondary | ICD-10-CM | POA: Diagnosis not present

## 2019-07-26 DIAGNOSIS — I469 Cardiac arrest, cause unspecified: Secondary | ICD-10-CM | POA: Diagnosis not present

## 2019-07-26 DIAGNOSIS — I10 Essential (primary) hypertension: Secondary | ICD-10-CM | POA: Diagnosis not present

## 2019-07-26 DIAGNOSIS — I442 Atrioventricular block, complete: Secondary | ICD-10-CM | POA: Diagnosis not present

## 2019-07-26 DIAGNOSIS — I480 Paroxysmal atrial fibrillation: Secondary | ICD-10-CM | POA: Diagnosis not present

## 2019-08-05 ENCOUNTER — Ambulatory Visit: Payer: No Typology Code available for payment source | Attending: Internal Medicine

## 2019-08-05 DIAGNOSIS — Z20822 Contact with and (suspected) exposure to covid-19: Secondary | ICD-10-CM

## 2019-08-06 LAB — NOVEL CORONAVIRUS, NAA: SARS-CoV-2, NAA: NOT DETECTED

## 2019-09-19 DIAGNOSIS — I4430 Unspecified atrioventricular block: Secondary | ICD-10-CM | POA: Diagnosis not present

## 2019-09-19 DIAGNOSIS — Z950 Presence of cardiac pacemaker: Secondary | ICD-10-CM | POA: Diagnosis not present

## 2019-09-19 DIAGNOSIS — Z45018 Encounter for adjustment and management of other part of cardiac pacemaker: Secondary | ICD-10-CM | POA: Diagnosis not present

## 2019-10-04 ENCOUNTER — Other Ambulatory Visit: Payer: Self-pay | Admitting: Internal Medicine

## 2019-10-10 DIAGNOSIS — E1165 Type 2 diabetes mellitus with hyperglycemia: Secondary | ICD-10-CM | POA: Diagnosis not present

## 2019-10-10 DIAGNOSIS — E271 Primary adrenocortical insufficiency: Secondary | ICD-10-CM | POA: Diagnosis not present

## 2019-10-10 DIAGNOSIS — E063 Autoimmune thyroiditis: Secondary | ICD-10-CM | POA: Diagnosis not present

## 2019-10-10 DIAGNOSIS — I5022 Chronic systolic (congestive) heart failure: Secondary | ICD-10-CM | POA: Diagnosis not present

## 2019-10-10 DIAGNOSIS — E038 Other specified hypothyroidism: Secondary | ICD-10-CM | POA: Diagnosis not present

## 2019-10-21 ENCOUNTER — Other Ambulatory Visit: Payer: Self-pay | Admitting: Internal Medicine

## 2019-11-06 IMAGING — DX DG CHEST 2V
2 series · 2 of 2 positions shown · non-contrast
Comparison: Radiographs August 09, 2017.

CLINICAL DATA: Shortness of breath, chest pain, tachycardia.

EXAM:
CHEST  2 VIEW

[chest pa]
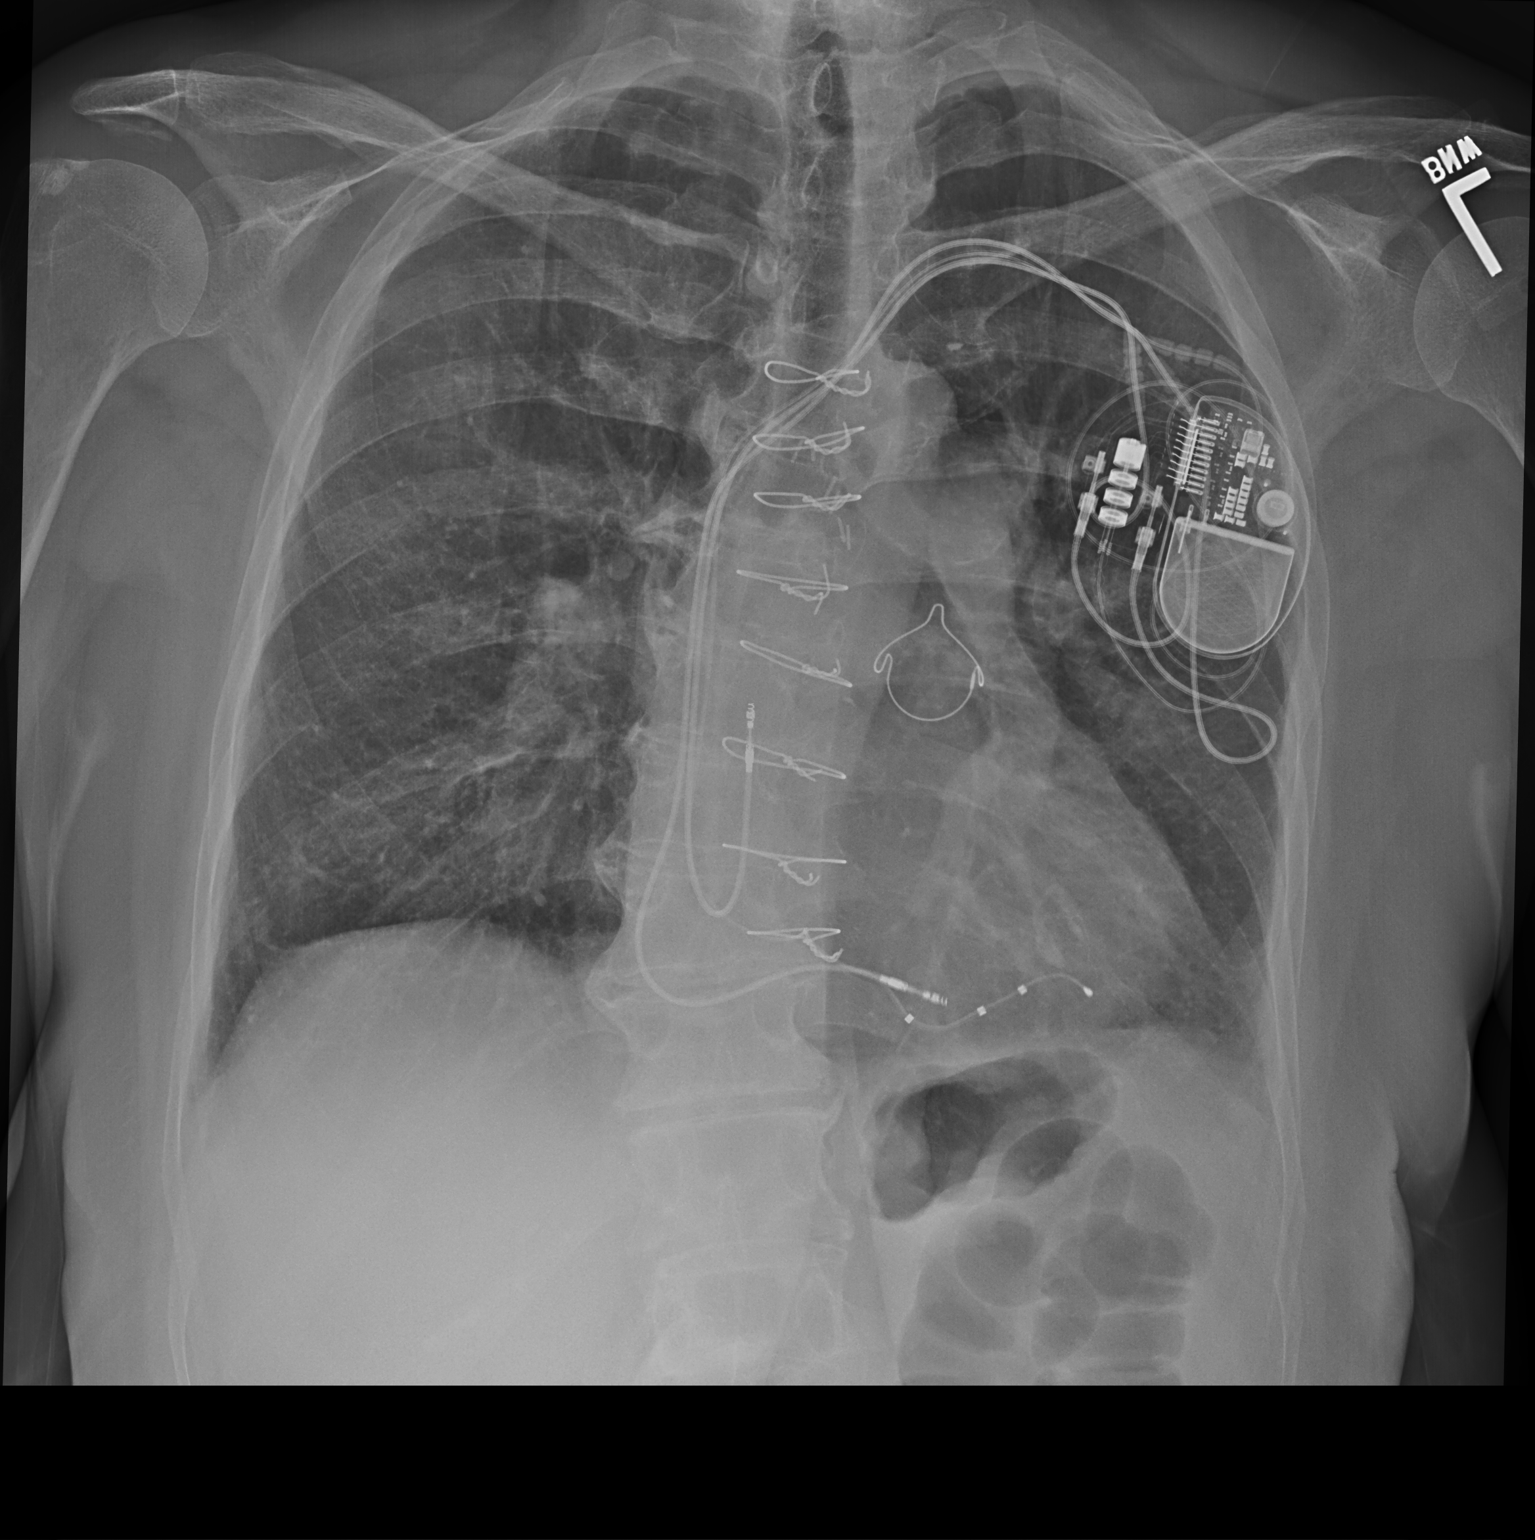

[chest lat]
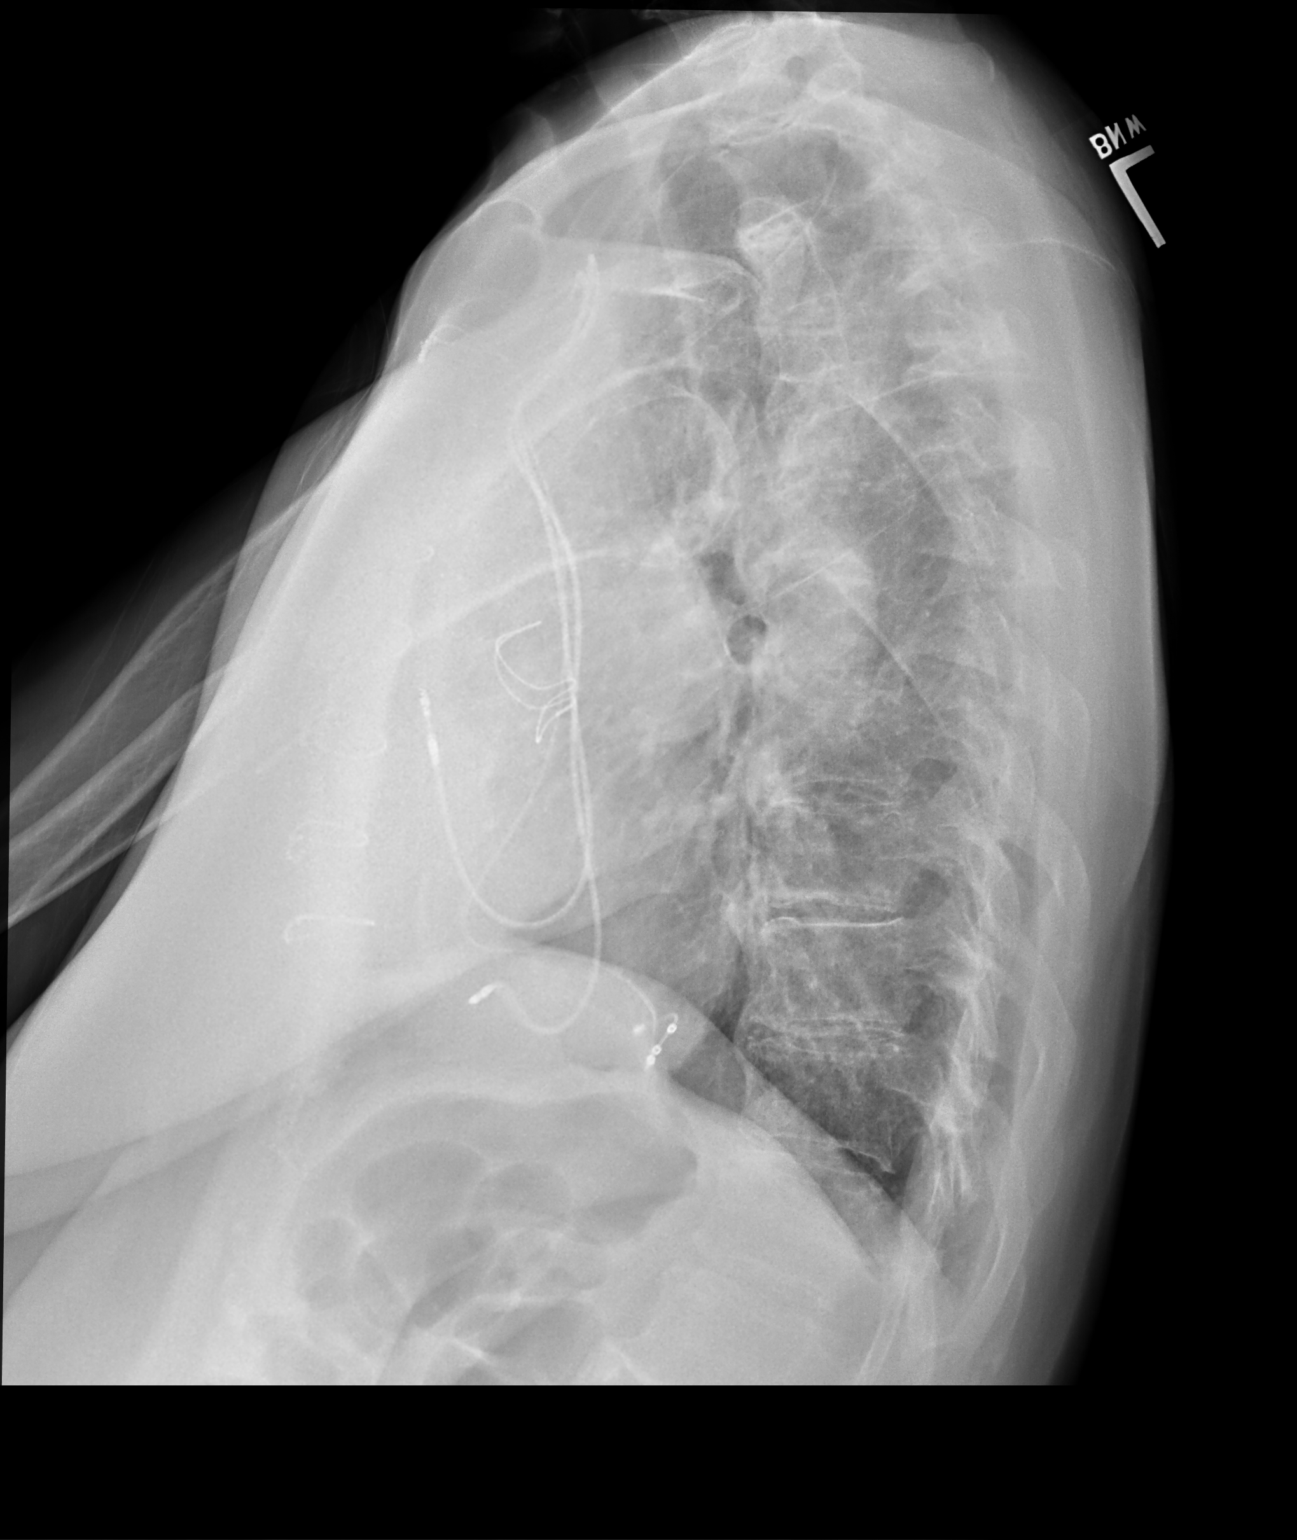

[2 of 2 positions shown; findings below may reference images not displayed]

FINDINGS: Stable cardiomediastinal silhouette. Status post cardiac valve
repair. Interval placement of left-sided pacemaker with leads in
grossly good position. No pneumothorax or pleural effusion is noted.
No acute pulmonary disease is noted. Bony thorax is unremarkable.
IMPRESSION: Interval placement of left-sided pacemaker with leads in grossly
good position. No acute abnormality is noted.

## 2019-12-26 DIAGNOSIS — Z950 Presence of cardiac pacemaker: Secondary | ICD-10-CM | POA: Diagnosis not present

## 2019-12-26 DIAGNOSIS — Z45018 Encounter for adjustment and management of other part of cardiac pacemaker: Secondary | ICD-10-CM | POA: Diagnosis not present

## 2019-12-26 DIAGNOSIS — I4430 Unspecified atrioventricular block: Secondary | ICD-10-CM | POA: Diagnosis not present

## 2020-01-03 DIAGNOSIS — I10 Essential (primary) hypertension: Secondary | ICD-10-CM | POA: Diagnosis not present

## 2020-01-03 DIAGNOSIS — I442 Atrioventricular block, complete: Secondary | ICD-10-CM | POA: Diagnosis not present

## 2020-02-21 ENCOUNTER — Other Ambulatory Visit: Payer: Self-pay | Admitting: Internal Medicine

## 2020-02-27 DIAGNOSIS — E038 Other specified hypothyroidism: Secondary | ICD-10-CM | POA: Diagnosis not present

## 2020-02-27 DIAGNOSIS — E1165 Type 2 diabetes mellitus with hyperglycemia: Secondary | ICD-10-CM | POA: Diagnosis not present

## 2020-02-27 DIAGNOSIS — E063 Autoimmune thyroiditis: Secondary | ICD-10-CM | POA: Diagnosis not present

## 2020-02-27 DIAGNOSIS — E271 Primary adrenocortical insufficiency: Secondary | ICD-10-CM | POA: Diagnosis not present

## 2020-03-20 ENCOUNTER — Encounter: Payer: Self-pay | Admitting: Emergency Medicine

## 2020-03-20 ENCOUNTER — Other Ambulatory Visit: Payer: Self-pay

## 2020-03-20 ENCOUNTER — Emergency Department
Admission: EM | Admit: 2020-03-20 | Discharge: 2020-03-20 | Disposition: A | Discharge status: Other Acute Inpatient Hospital | Payer: PPO | Attending: Emergency Medicine | Admitting: Emergency Medicine

## 2020-03-20 ENCOUNTER — Telehealth: Payer: Self-pay

## 2020-03-20 ENCOUNTER — Emergency Department: Payer: PPO

## 2020-03-20 ENCOUNTER — Ambulatory Visit (INDEPENDENT_AMBULATORY_CARE_PROVIDER_SITE_OTHER)
Admission: EM | Admit: 2020-03-20 | Discharge: 2020-03-20 | Disposition: A | Discharge status: Home / Self Care | Payer: PPO | Source: Home / Self Care | Attending: Family Medicine | Admitting: Family Medicine

## 2020-03-20 DIAGNOSIS — E872 Acidosis, unspecified: Secondary | ICD-10-CM

## 2020-03-20 DIAGNOSIS — J45909 Unspecified asthma, uncomplicated: Secondary | ICD-10-CM | POA: Diagnosis not present

## 2020-03-20 DIAGNOSIS — Z952 Presence of prosthetic heart valve: Secondary | ICD-10-CM | POA: Diagnosis not present

## 2020-03-20 DIAGNOSIS — Z794 Long term (current) use of insulin: Secondary | ICD-10-CM | POA: Insufficient documentation

## 2020-03-20 DIAGNOSIS — I110 Hypertensive heart disease with heart failure: Secondary | ICD-10-CM | POA: Insufficient documentation

## 2020-03-20 DIAGNOSIS — R279 Unspecified lack of coordination: Secondary | ICD-10-CM | POA: Diagnosis not present

## 2020-03-20 DIAGNOSIS — Z7952 Long term (current) use of systemic steroids: Secondary | ICD-10-CM | POA: Diagnosis not present

## 2020-03-20 DIAGNOSIS — E1100 Type 2 diabetes mellitus with hyperosmolarity without nonketotic hyperglycemic-hyperosmolar coma (NKHHC): Secondary | ICD-10-CM | POA: Diagnosis not present

## 2020-03-20 DIAGNOSIS — Z950 Presence of cardiac pacemaker: Secondary | ICD-10-CM

## 2020-03-20 DIAGNOSIS — Z7901 Long term (current) use of anticoagulants: Secondary | ICD-10-CM | POA: Insufficient documentation

## 2020-03-20 DIAGNOSIS — E272 Addisonian crisis: Secondary | ICD-10-CM | POA: Insufficient documentation

## 2020-03-20 DIAGNOSIS — I5022 Chronic systolic (congestive) heart failure: Secondary | ICD-10-CM | POA: Insufficient documentation

## 2020-03-20 DIAGNOSIS — E271 Primary adrenocortical insufficiency: Secondary | ICD-10-CM | POA: Insufficient documentation

## 2020-03-20 DIAGNOSIS — R42 Dizziness and giddiness: Secondary | ICD-10-CM | POA: Diagnosis not present

## 2020-03-20 DIAGNOSIS — Q221 Congenital pulmonary valve stenosis: Secondary | ICD-10-CM | POA: Diagnosis not present

## 2020-03-20 DIAGNOSIS — I482 Chronic atrial fibrillation, unspecified: Secondary | ICD-10-CM | POA: Insufficient documentation

## 2020-03-20 DIAGNOSIS — E039 Hypothyroidism, unspecified: Secondary | ICD-10-CM | POA: Insufficient documentation

## 2020-03-20 DIAGNOSIS — Z20822 Contact with and (suspected) exposure to covid-19: Secondary | ICD-10-CM

## 2020-03-20 DIAGNOSIS — F418 Other specified anxiety disorders: Secondary | ICD-10-CM | POA: Diagnosis not present

## 2020-03-20 DIAGNOSIS — M542 Cervicalgia: Secondary | ICD-10-CM | POA: Insufficient documentation

## 2020-03-20 DIAGNOSIS — R519 Headache, unspecified: Secondary | ICD-10-CM

## 2020-03-20 DIAGNOSIS — E785 Hyperlipidemia, unspecified: Secondary | ICD-10-CM | POA: Insufficient documentation

## 2020-03-20 DIAGNOSIS — E876 Hypokalemia: Secondary | ICD-10-CM | POA: Diagnosis not present

## 2020-03-20 DIAGNOSIS — I442 Atrioventricular block, complete: Secondary | ICD-10-CM | POA: Insufficient documentation

## 2020-03-20 DIAGNOSIS — R112 Nausea with vomiting, unspecified: Secondary | ICD-10-CM

## 2020-03-20 DIAGNOSIS — I959 Hypotension, unspecified: Secondary | ICD-10-CM | POA: Diagnosis not present

## 2020-03-20 DIAGNOSIS — Z79899 Other long term (current) drug therapy: Secondary | ICD-10-CM | POA: Insufficient documentation

## 2020-03-20 DIAGNOSIS — Z885 Allergy status to narcotic agent status: Secondary | ICD-10-CM | POA: Insufficient documentation

## 2020-03-20 DIAGNOSIS — Z90710 Acquired absence of both cervix and uterus: Secondary | ICD-10-CM | POA: Diagnosis not present

## 2020-03-20 DIAGNOSIS — G4489 Other headache syndrome: Secondary | ICD-10-CM | POA: Diagnosis not present

## 2020-03-20 DIAGNOSIS — D72829 Elevated white blood cell count, unspecified: Secondary | ICD-10-CM

## 2020-03-20 DIAGNOSIS — E119 Type 2 diabetes mellitus without complications: Secondary | ICD-10-CM | POA: Insufficient documentation

## 2020-03-20 DIAGNOSIS — Z743 Need for continuous supervision: Secondary | ICD-10-CM | POA: Diagnosis not present

## 2020-03-20 DIAGNOSIS — E860 Dehydration: Secondary | ICD-10-CM | POA: Diagnosis not present

## 2020-03-20 DIAGNOSIS — R739 Hyperglycemia, unspecified: Secondary | ICD-10-CM | POA: Diagnosis not present

## 2020-03-20 DIAGNOSIS — F329 Major depressive disorder, single episode, unspecified: Secondary | ICD-10-CM | POA: Insufficient documentation

## 2020-03-20 DIAGNOSIS — R0902 Hypoxemia: Secondary | ICD-10-CM | POA: Diagnosis not present

## 2020-03-20 DIAGNOSIS — I10 Essential (primary) hypertension: Secondary | ICD-10-CM | POA: Diagnosis not present

## 2020-03-20 DIAGNOSIS — Z8674 Personal history of sudden cardiac arrest: Secondary | ICD-10-CM | POA: Insufficient documentation

## 2020-03-20 DIAGNOSIS — R531 Weakness: Secondary | ICD-10-CM | POA: Diagnosis not present

## 2020-03-20 DIAGNOSIS — E1165 Type 2 diabetes mellitus with hyperglycemia: Secondary | ICD-10-CM | POA: Diagnosis not present

## 2020-03-20 DIAGNOSIS — F419 Anxiety disorder, unspecified: Secondary | ICD-10-CM | POA: Diagnosis not present

## 2020-03-20 DIAGNOSIS — B349 Viral infection, unspecified: Secondary | ICD-10-CM | POA: Diagnosis not present

## 2020-03-20 LAB — URINALYSIS, COMPLETE (UACMP) WITH MICROSCOPIC
Bilirubin Urine: NEGATIVE
Glucose, UA: >=500 mg/dL — AB
Hgb urine dipstick: NEGATIVE
Ketones, ur: NEGATIVE mg/dL
Nitrite: NEGATIVE
Protein, ur: NEGATIVE mg/dL
Specific Gravity, Urine: 1.007 (ref 1.005–1.030)
pH: 5 (ref 5.0–8.0)

## 2020-03-20 LAB — HEPATIC FUNCTION PANEL
ALT: 25 U/L (ref 0–44)
AST: 33 U/L (ref 15–41)
Albumin: 4 g/dL (ref 3.5–5.0)
Alkaline Phosphatase: 85 U/L (ref 38–126)
Bilirubin, Direct: 0.2 mg/dL (ref 0.0–0.2)
Indirect Bilirubin: 0.8 mg/dL (ref 0.3–0.9)
Total Bilirubin: 1 mg/dL (ref 0.3–1.2)
Total Protein: 7.3 g/dL (ref 6.5–8.1)

## 2020-03-20 LAB — CBC WITH DIFFERENTIAL/PLATELET
Abs Immature Granulocytes: 0.04 10*3/uL (ref 0.00–0.07)
Basophils Absolute: 0.1 10*3/uL (ref 0.0–0.1)
Basophils Relative: 1 %
Eosinophils Absolute: 0.2 10*3/uL (ref 0.0–0.5)
Eosinophils Relative: 1 %
HCT: 48.9 % — ABNORMAL HIGH (ref 36.0–46.0)
Hemoglobin: 15.6 g/dL — ABNORMAL HIGH (ref 12.0–15.0)
Immature Granulocytes: 0 %
Lymphocytes Relative: 19 %
Lymphs Abs: 2.5 10*3/uL (ref 0.7–4.0)
MCH: 27.5 pg (ref 26.0–34.0)
MCHC: 31.9 g/dL (ref 30.0–36.0)
MCV: 86.1 fL (ref 80.0–100.0)
Monocytes Absolute: 0.8 10*3/uL (ref 0.1–1.0)
Monocytes Relative: 6 %
Neutro Abs: 9.4 10*3/uL — ABNORMAL HIGH (ref 1.7–7.7)
Neutrophils Relative %: 73 %
Platelets: 164 10*3/uL (ref 150–400)
RBC: 5.68 MIL/uL — ABNORMAL HIGH (ref 3.87–5.11)
RDW: 15.6 % — ABNORMAL HIGH (ref 11.5–15.5)
WBC: 13 10*3/uL — ABNORMAL HIGH (ref 4.0–10.5)
nRBC: 0 % (ref 0.0–0.2)

## 2020-03-20 LAB — BASIC METABOLIC PANEL
Anion gap: 13 (ref 5–15)
BUN: 18 mg/dL (ref 8–23)
CO2: 28 mmol/L (ref 22–32)
Calcium: 9.3 mg/dL (ref 8.9–10.3)
Chloride: 97 mmol/L — ABNORMAL LOW (ref 98–111)
Creatinine, Ser: 1.17 mg/dL — ABNORMAL HIGH (ref 0.44–1.00)
GFR calc Af Amer: 54 mL/min — ABNORMAL LOW (ref >60)
GFR calc non Af Amer: 47 mL/min — ABNORMAL LOW (ref >60)
Glucose, Bld: 192 mg/dL — ABNORMAL HIGH (ref 70–99)
Potassium: 3.7 mmol/L (ref 3.5–5.1)
Sodium: 138 mmol/L (ref 135–145)

## 2020-03-20 LAB — PROTIME-INR
INR: 1.5 — ABNORMAL HIGH (ref 0.8–1.2)
Prothrombin Time: 17.2 seconds — ABNORMAL HIGH (ref 11.4–15.2)

## 2020-03-20 LAB — LACTIC ACID, PLASMA
Lactic Acid, Venous: 2.6 mmol/L (ref 0.5–1.9)
Lactic Acid, Venous: 3.3 mmol/L (ref 0.5–1.9)

## 2020-03-20 LAB — TSH: TSH: 4.112 u[IU]/mL (ref 0.350–4.500)

## 2020-03-20 LAB — SARS CORONAVIRUS 2 (TAT 6-24 HRS): SARS Coronavirus 2: NEGATIVE

## 2020-03-20 LAB — SARS CORONAVIRUS 2 BY RT PCR (HOSPITAL ORDER, PERFORMED IN ~~LOC~~ HOSPITAL LAB): SARS Coronavirus 2: NEGATIVE

## 2020-03-20 LAB — PROCALCITONIN: Procalcitonin: <0.1 ng/mL

## 2020-03-20 LAB — APTT: aPTT: 38 seconds — ABNORMAL HIGH (ref 24–36)

## 2020-03-20 LAB — CORTISOL: Cortisol, Plasma: 13.7 ug/dL

## 2020-03-20 LAB — MAGNESIUM: Magnesium: 1.7 mg/dL (ref 1.7–2.4)

## 2020-03-20 MED ORDER — LACTATED RINGERS IV BOLUS
1000.0000 mL | Freq: Once | INTRAVENOUS | Status: AC
  Administered 2020-03-20: 1000 mL via INTRAVENOUS

## 2020-03-20 MED ORDER — INSULIN GLARGINE 100 UNIT/ML ~~LOC~~ SOLN
13.0000 [IU] | Freq: Once | SUBCUTANEOUS | Status: DC
  Filled 2020-03-20: qty 0.13

## 2020-03-20 MED ORDER — LACTATED RINGERS IV SOLN: INTRAVENOUS | Status: DC

## 2020-03-20 MED ORDER — ATORVASTATIN CALCIUM 20 MG PO TABS
40.0000 mg | ORAL_TABLET | Freq: Every day | ORAL | Status: DC
  Administered 2020-03-20: 40 mg via ORAL
  Filled 2020-03-20: qty 2

## 2020-03-20 MED ORDER — TRAZODONE HCL 50 MG PO TABS
150.0000 mg | ORAL_TABLET | Freq: Every day | ORAL | Status: DC
  Administered 2020-03-20: 150 mg via ORAL
  Filled 2020-03-20: qty 1

## 2020-03-20 MED ORDER — APIXABAN 5 MG PO TABS: 5.0000 mg | ORAL_TABLET | Freq: Two times a day (BID) | ORAL | Status: DC

## 2020-03-20 MED ORDER — HYDROCORTISONE NA SUCCINATE PF 100 MG IJ SOLR
50.0000 mg | Freq: Three times a day (TID) | INTRAMUSCULAR | Status: DC
  Administered 2020-03-20: 50 mg via INTRAVENOUS
  Filled 2020-03-20: qty 2

## 2020-03-20 MED ORDER — SODIUM CHLORIDE 0.9 % IV SOLN
2.0000 g | Freq: Once | INTRAVENOUS | Status: AC
  Administered 2020-03-20: 2 g via INTRAVENOUS
  Filled 2020-03-20: qty 2

## 2020-03-20 MED ORDER — MIRTAZAPINE 15 MG PO TABS
7.5000 mg | ORAL_TABLET | Freq: Every day | ORAL | Status: DC
  Administered 2020-03-20: 7.5 mg via ORAL
  Filled 2020-03-20: qty 1

## 2020-03-20 MED ORDER — HYDROCORTISONE NA SUCCINATE PF 100 MG IJ SOLR
100.0000 mg | Freq: Once | INTRAMUSCULAR | Status: AC
  Administered 2020-03-20: 100 mg via INTRAVENOUS
  Filled 2020-03-20: qty 2

## 2020-03-20 MED ORDER — METFORMIN HCL 500 MG PO TABS
1000.0000 mg | ORAL_TABLET | Freq: Once | ORAL | Status: AC
  Administered 2020-03-20: 1000 mg via ORAL
  Filled 2020-03-20: qty 2

## 2020-03-20 NOTE — ED Notes (Signed)
Patient is being discharged from the Urgent Care and sent to the Emergency Department via EMS . Per Dr. Susa Simmonds Dr. Lacinda Axon, patient is in need of higher level of care due to hypotension. Patient is aware and verbalizes understanding of plan of care.  Vitals:   03/20/20 1206 03/20/20 1207  BP: (!) 81/66 (!) 82/58  Pulse:    Resp:    Temp:    SpO2:

## 2020-03-20 NOTE — ED Notes (Signed)
[facility] to transport to [facility] bed 5221, [place] to [employer].

## 2020-03-20 NOTE — ED Triage Notes (Signed)
Pt states that she started feeling weak and dizzy yesterday, states that she went to [facility] urgent care due to the way she was feeling and states  Not right in her head and thought maybe she had covid, so she went for a test and found that she had a low bp and the dr there thought maybe she was in an addison's crisis

## 2020-03-20 NOTE — ED Provider Notes (Addendum)
[facility]    CSN: [id number] Arrival date & time: 03/20/20  1015      History   Chief Complaint Chief Complaint  Patient presents with  . Headache    HPI Haley Young is a 69 y.o. female.   HPI   Patient with 2-day history of frontal headache, neck pain with associated vomiting.  Patient states this is a new headache for her she has not had similar symptoms previously.  Patient vomited 3 times yesterday which was nonbloody.  Has persistent nausea.  Patient has history of Addison's disease pulmonary stenosis, heart failure and diabetes.  Patient there has been no recent changes to her Addison's medications.  She takes 10 mg in the morning and 5 mg at bedtime.  Denies missing any doses of her medication. Denies recent sick contacts, fever, myalgias, sore throat, new weakness, numbness or tingling. She endorses fatigue, cough and dizziness when she moves around. Blood sugars ranged from 90-200 at home.    Past Medical History:  Diagnosis Date  . Asthma   . Chicken pox   . Depression   . Diabetes mellitus   . Frequent UTI   . GERD (gastroesophageal reflux disease)   . Heart disease   . Heart murmur   . 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 Hx of colonic polyps   . Hyperhidrosis    especially with hot flashes    . Hyperlipidemia   . Lipoma    R axilla   . MRSA cellulitis    surgical wound infection  . Osteoporosis   . Pyelocystitis    as a child   . Thyroid disease   . Urinary incontinence     Patient Active Problem List   Diagnosis Date Noted  . Chronic left SI joint pain 05/01/2019  . Transaminitis 05/01/2019  . Diarrhea in adult patient 03/29/2019  . Educated about COVID-19 virus infection 11/15/2018  . Anemia 07/06/2018  . Chronic atrial fibrillation ([place]) 05/25/2018  . Neuropathic pain of hand 05/03/2018  . History of cardiac arrest 05/03/2018  . Cerebral infarction, watershed distribution, bilateral, remote,  resolved 05/03/2018  . Chronic systolic heart failure ([place]) 04/26/2018  . Dysphagia, oropharyngeal 04/26/2018  . Chronic constipation 04/26/2018  . Adrenal insufficiency (Addison's disease) ([place]) 01/20/2018  . Hospital discharge follow-up 01/20/2018  . 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 CHB (complete heart block) ([place]) 08/17/2017  . Mobitz type 2 second degree AV block 08/09/2017  . Decreased GFR 05/18/2017  . Hyponatremia 11/01/2016  . Drug-induced vitamin B12 deficiency anemia 04/16/2016  . Type 2 diabetes mellitus with hyperglycemia, with long-term current use of insulin ([place]) 08/21/2015  . Overweight (BMI 25.0-29.9) 10/07/2014  . Depression with anxiety 08/08/2014  . Medicare annual wellness visit, initial 09/13/2013  . Hyperlipidemia associated with type 2 diabetes mellitus ([place]) 07/25/2013  . Insomnia 07/25/2013  . Hypothyroidism 03/29/2012  . Screening for breast cancer 03/29/2012  . Screening for colon cancer 03/29/2012  . Depression, recurrent ([place]) 08/13/2008  . GERD 08/13/2008  . Osteopenia 08/13/2008  . COLONIC POLYPS, HX OF 08/13/2008    Past Surgical History:  Procedure Laterality Date  . ABDOMINAL HYSTERECTOMY  1970  . [place], 2005   pulmonary stenosis with valve replacement, Dr. Evelina Dun  . PARTIAL HYSTERECTOMY  1970  . PULMONARY VEIN STENOSIS REPAIR    . VALVE REPLACEMENT      OB History   No obstetric history  on file.      Home Medications    Prior to Admission medications   Medication Sig Start Date End Date Taking? Authorizing Provider  apixaban (ELIQUIS) 5 MG TABS tablet Take 1 tablet (5 mg total) by mouth 2 (two) times daily. 05/23/18  Yes [name], MD  atorvastatin (LIPITOR) 40 MG tablet Take 40 mg by mouth daily.   Yes [provider]  Fludrocortisone Acetate (FLORINEF PO) Take 0.1 mg by mouth daily.   Yes [provider]  FLUoxetine (PROZAC) 10 MG tablet TAKE 1 TABLET BY MOUTH  EVERY DAY 10/21/19  Yes [name], MD  furosemide (LASIX) 20 MG tablet Take 20 mg by mouth daily.    Yes [provider]  glipiZIDE (GLUCOTROL) 10 MG tablet Take 1 tablet (10 mg total) by mouth 2 (two) times daily before a meal. 11/14/18  Yes [name], MD  hydrocortisone (CORTEF) 10 MG tablet Take 5 mg by mouth 2 (two) times daily. 10 mg QAM, 5 mg lunch, 5 PM   Yes [provider]  hydrocortisone sodium succinate (SOLU-CORTEF) 100 MG SOLR injection Use 1 injection for adrenal crisis 05/15/18  Yes [provider]  Insulin Glargine (LANTUS SOLOSTAR) 100 UNIT/ML Solostar Pen Inject 15 Units into the skin daily. ADJUST UPWARD AS NEEDED EVERY 3 DAYS 03/29/19  Yes [name], MD  insulin glargine (LANTUS SOLOSTAR) 100 UNIT/ML Solostar Pen Inject into the skin. 10/10/19 10/09/20 Yes [provider]  levothyroxine (SYNTHROID) 112 MCG tablet Take 1 tablet (112 mcg total) by mouth daily. 11/14/18  Yes [name], MD  metformin (FORTAMET) 1000 MG (OSM) 24 hr tablet Take 1,000 mg by mouth daily with breakfast.   Yes [provider]  mirtazapine (REMERON) 7.5 MG tablet TAKE 1 TABLET BY MOUTH EVERYDAY AT BEDTIME 02/21/20  Yes [name], MD  Semaglutide, 1 MG/DOSE, (OZEMPIC, 1 MG/DOSE,) 2 MG/1.5ML SOPN Inject into the skin. Inject into stomach 1 time weekly   Yes [provider]  traZODone (DESYREL) 150 MG tablet TAKE 1 TABLET (150 MG TOTAL) BY MOUTH AT BEDTIME. 10/07/19  Yes [name], MD  glucose blood (ONE TOUCH ULTRA TEST) test strip USE ONE STRIP TO CHECK GLUCOSE THREE TIMES DAILY AS DIRECTED E11.65 01/15/18   Philemon Kingdom, MD  Insulin Pen Needle 32G X 4 MM MISC Used to give daily insulin injections. 03/29/19   [name], MD  Potassium Chloride ER 20 MEQ TBCR Take 1 tablet by mouth once daily 11/30/18   [name], MD    Family History Family History  Problem Relation Age of Onset  . Coarctation of the aorta Sister   .  Diabetes Mother   . COPD Mother   . Obesity Mother   . Depression Mother   . Pancreatic cancer Mother   . Alcohol abuse Father   . Cirrhosis Father   . Down syndrome Brother   . Pneumonia Brother   . Down syndrome Daughter   . Lupus Daughter   . Ulcerative colitis Daughter   . Diabetes Maternal Aunt   . Breast cancer Maternal Aunt   . Breast cancer Maternal Aunt     Social History Social History   Tobacco Use  . Smoking status: Never Smoker  . Smokeless tobacco: Never Used  Vaping Use  . Vaping Use: Never used  Substance Use Topics  . Alcohol use: Yes    Comment: occasional wine   . Drug use: No  Allergies   Oxycodone hcl, Albuterol, and Oxycodone hcl   Review of Systems Review of Systems: See HPI    Physical Exam Triage Vital Signs ED Triage Vitals  Enc Vitals Group     BP 03/20/20 1110 (!) 90/51     Pulse Rate 03/20/20 1110 89     Resp 03/20/20 1110 18     Temp 03/20/20 1110 98.6 F (37 C)     Temp Source 03/20/20 1110 Oral     SpO2 03/20/20 1110 96 %     Weight 03/20/20 1104 139 lb 15.9 oz (63.5 kg)     Height 03/20/20 1104 5\' 1"  (1.549 m)     Head Circumference --      Peak Flow --      Pain Score 03/20/20 1104 8     Pain Loc --      Pain Edu? --      Excl. in [place]? --    No data found.  Updated Vital Signs BP (!) 82/58 (BP Location: Left Arm)   Pulse 89   Temp 98.6 F (37 C) (Oral)   Resp 18   Ht 5\' 1"  (1.549 m)   Wt 139 lb 15.9 oz (63.5 kg)   SpO2 96%   BMI 26.45 kg/m   Visual Acuity Right Eye Distance:   Left Eye Distance:   Bilateral Distance:    Right Eye Near:   Left Eye Near:    Bilateral Near:     Physical Exam Vitals and nursing note reviewed.  Constitutional:      General: She is not in acute distress.    Appearance: She is well-developed.  HENT:     Head: Normocephalic and atraumatic.     Mouth/Throat:     Mouth: Mucous membranes are moist.     Pharynx: Oropharynx is clear.  Eyes:     Extraocular Movements:  Extraocular movements intact.     Right eye: No nystagmus.     Left eye: No nystagmus.     Conjunctiva/sclera: Conjunctivae normal.     Pupils: Pupils are equal, round, and reactive to light.  Cardiovascular:     Rate and Rhythm: Normal rate and regular rhythm.     Heart sounds: Murmur heard.   Pulmonary:     Effort: Pulmonary effort is normal. No respiratory distress.     Breath sounds: Normal breath sounds.  Abdominal:     Palpations: Abdomen is soft.     Tenderness: There is no abdominal tenderness.  Musculoskeletal:        General: Normal range of motion.     Cervical back: Normal range of motion and neck supple.  Skin:    General: Skin is warm and dry.     Capillary Refill: Capillary refill takes less than 2 seconds.  Neurological:     Mental Status: She is alert and oriented to person, place, and time. Mental status is at baseline.     GCS: GCS eye subscore is 4. GCS verbal subscore is 5. GCS motor subscore is 6.     Cranial Nerves: No cranial nerve deficit.     Coordination: Coordination normal.     Gait: Gait normal.     Comments: Strength 5/5 bilateral upper and lower extremities   Psychiatric:        Mood and Affect: Mood normal.        Speech: Speech normal.        Behavior: Behavior normal.  UC Treatments / Results  Labs (all labs ordered are listed, but only abnormal results are displayed) Labs Reviewed  BASIC METABOLIC PANEL - Abnormal; Notable for the following components:      Result Value   Chloride 97 (*)    Glucose, Bld 192 (*)    Creatinine, Ser 1.17 (*)    GFR calc non Af Amer 47 (*)    GFR calc Af Amer 54 (*)    All other components within normal limits  CBC WITH DIFFERENTIAL/PLATELET - Abnormal; Notable for the following components:   WBC 13.0 (*)    RBC 5.68 (*)    Hemoglobin 15.6 (*)    HCT 48.9 (*)    RDW 15.6 (*)    Neutro Abs 9.4 (*)    All other components within normal limits  SARS CORONAVIRUS 2 (TAT 6-24 HRS)     EKG   Radiology No results found.  Procedures Procedures (including critical care time)  Medications Ordered in UC Medications - No data to display  Initial Impression / Assessment and Plan / UC Course  I have reviewed the triage vital signs and the nursing notes.  Pertinent labs & imaging results that were available during my care of the patient were reviewed by me and considered in my medical decision making (see chart for details).     Patient is a 69 year old female with Addison's disease with new onset headache and neck pain.  She also endorses vomiting and changes in her blood sugar.  Here patient has been hypotensive. Neuro exam reassuring.  Given her history, recommended further evaluation and work up. Discussed plan with pt and her husband. Pt sent to the emergency department via EMS. Discussed transfer of patient via EMS with [facility].   Final Clinical Impressions(s) / UC Diagnoses   Final diagnoses:  Addison's disease ([place])  Acute nonintractable headache, unspecified headache type  Nausea and vomiting, intractability of vomiting not specified, unspecified vomiting type     Discharge Instructions     You were seen at the Urgent Care for headache and neck pain. We are sending you the the emergency department for evaluation as your blood pressures continued to drop with a new onset headache in the setting of your history of Addison's disease.   [facility]     ED Prescriptions    None     PDMP not reviewed this encounter.   Lyndee Hensen, DO 03/20/20 [place], Shirlee Whitm[place], DO 03/20/20 1238

## 2020-03-20 NOTE — Progress Notes (Signed)
CODE SEPSIS - PHARMACY COMMUNICATION  **Broad Spectrum Antibiotics should be administered within 1 hour of Sepsis diagnosis**  Time Code Sepsis Called/Page Received: 6861  Antibiotics Ordered: Cefepime  Time of 1st antibiotic administration: 1505  Additional action taken by pharmacy: n/a  Benita Gutter 03/20/2020  3:59 PM

## 2020-03-20 NOTE — ED Notes (Signed)
Accepted to [facility] bed 5221, Santiago Glad to [employer].

## 2020-03-20 NOTE — ED Notes (Signed)
Pt in CT.

## 2020-03-20 NOTE — Discharge Instructions (Signed)
You were seen at the Urgent Care for headache and neck pain. We are sending you the the emergency department for evaluation as your blood pressures continued to drop with a new onset headache in the setting of your history of Addison's disease.   Dr[facility]

## 2020-03-20 NOTE — ED Triage Notes (Addendum)
Pt c/o headache, vomiting, cough and fatigue. Started yesterday. Denies fever or body aches. She states she has been slightly dizzy.

## 2020-03-20 NOTE — ED Notes (Signed)
Spoke with Legrand Como at [facility] for a status, he states MD has been paged twice.  Awaiting a return call

## 2020-03-20 NOTE — ED Provider Notes (Signed)
[facility] Emergency Department Provider Note  ____________________________________________   None    (approximate)  I have reviewed the triage vital signs and the nursing notes.   HISTORY  Chief Complaint Weakness and Dizziness   HPI Haley Young is a 69 y.o. female with a past medical history of congenital pulmonary valve stenosis (s/p valvotomy at age 47, resulting in severe PI, s/p PVR recent symptomatic severe prosthetic PR and PS, with transcatheter valve replacement on 12/05/17), complete heart block status post biventricular pacemaker in Jan 2019, anticoagulated on Eliquis, T2DM, and HTN who presents via EMS from clinic for further work-up of approximately headache associated some neck pain and vomiting that began yesterday.  Patient also endorses couple days of nonproductive cough.  Patient has been taking 10 mg of hydrocortizon in the morning and 5 mg at night and has not missed any dosages.  She states she often has headaches with headache that she experienced yesterday was worse than usual.  Is not clear exactly how quickly this headache came on.  She denies any falls or injuries.  She states she feels a little nauseous now and has little bit of a headache but not nearly as bad as she did yesterday.  She denies any diarrhea, urinary symptoms, chest pain, abdominal pain, extremity pain, vertigo, earache, eye pain, or other acute complaints.  No prior similar episodes.  No clear alleviating aggravating factors.          Past Medical History:  Diagnosis Date  . Asthma   . Chicken pox   . Depression   . Diabetes mellitus   . Frequent UTI   . GERD (gastroesophageal reflux disease)   . Heart disease   . Heart murmur   . 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 Hx of colonic polyps   . Hyperhidrosis    especially with hot flashes    . Hyperlipidemia   . Lipoma    R axilla   . MRSA cellulitis    surgical wound infection    . Osteoporosis   . Pyelocystitis    as a child   . Thyroid disease   . Urinary incontinence     Patient Active Problem List   Diagnosis Date Noted  . Chronic left SI joint pain 05/01/2019  . Transaminitis 05/01/2019  . Diarrhea in adult patient 03/29/2019  . Educated about COVID-19 virus infection 11/15/2018  . Anemia 07/06/2018  . Chronic atrial fibrillation ([place]) 05/25/2018  . Neuropathic pain of hand 05/03/2018  . History of cardiac arrest 05/03/2018  . Cerebral infarction, watershed distribution, bilateral, remote, resolved 05/03/2018  . Chronic systolic heart failure ([place]) 04/26/2018  . Dysphagia, oropharyngeal 04/26/2018  . Chronic constipation 04/26/2018  . Adrenal insufficiency (Addison's disease) ([place]) 01/20/2018  . Hospital discharge follow-up 01/20/2018  . 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 CHB (complete heart block) ([place]) 08/17/2017  . Mobitz type 2 second degree AV block 08/09/2017  . Decreased GFR 05/18/2017  . Hyponatremia 11/01/2016  . Drug-induced vitamin B12 deficiency anemia 04/16/2016  . Type 2 diabetes mellitus with hyperglycemia, with long-term current use of insulin ([place]) 08/21/2015  . Overweight (BMI 25.0-29.9) 10/07/2014  . Depression with anxiety 08/08/2014  . Medicare annual wellness visit, initial 09/13/2013  . Hyperlipidemia associated with type 2 diabetes mellitus ([place]) 07/25/2013  . Insomnia 07/25/2013  . Hypothyroidism 03/29/2012  . Screening for breast cancer 03/29/2012  . Screening for colon cancer 03/29/2012  .  Depression, recurrent ([place]) 08/13/2008  . GERD 08/13/2008  . Osteopenia 08/13/2008  . COLONIC POLYPS, HX OF 08/13/2008    Past Surgical History:  Procedure Laterality Date  . ABDOMINAL HYSTERECTOMY  1970  . [place], 2005   pulmonary stenosis with valve replacement, Dr. Evelina Dun  . PARTIAL HYSTERECTOMY  1970  . PULMONARY VEIN STENOSIS REPAIR    . VALVE REPLACEMENT      Prior to  Admission medications   Medication Sig Start Date End Date Taking? Authorizing Provider  apixaban (ELIQUIS) 5 MG TABS tablet Take 1 tablet (5 mg total) by mouth 2 (two) times daily. 05/23/18   [name], MD  atorvastatin (LIPITOR) 40 MG tablet Take 40 mg by mouth daily.    [provider]  Fludrocortisone Acetate (FLORINEF PO) Take 0.1 mg by mouth daily.    [provider]  FLUoxetine (PROZAC) 10 MG tablet TAKE 1 TABLET BY MOUTH EVERY DAY 10/21/19   [name], MD  furosemide (LASIX) 20 MG tablet Take 20 mg by mouth daily.     [provider]  glipiZIDE (GLUCOTROL) 10 MG tablet Take 1 tablet (10 mg total) by mouth 2 (two) times daily before a meal. 11/14/18   [name], MD  glucose blood (ONE TOUCH ULTRA TEST) test strip USE ONE STRIP TO CHECK GLUCOSE THREE TIMES DAILY AS DIRECTED E11.65 01/15/18   Philemon Kingdom, MD  hydrocortisone (CORTEF) 10 MG tablet Take 5 mg by mouth 2 (two) times daily. 10 mg QAM, 5 mg lunch, 5 PM    [provider]  hydrocortisone sodium succinate (SOLU-CORTEF) 100 MG SOLR injection Use 1 injection for adrenal crisis 05/15/18   [provider]  Insulin Glargine (LANTUS SOLOSTAR) 100 UNIT/ML Solostar Pen Inject 15 Units into the skin daily. ADJUST UPWARD AS NEEDED EVERY 3 DAYS 03/29/19   [name], MD  insulin glargine (LANTUS SOLOSTAR) 100 UNIT/ML Solostar Pen Inject into the skin. 10/10/19 10/09/20  [provider]  Insulin Pen Needle 32G X 4 MM MISC Used to give daily insulin injections. 03/29/19   [name], MD  levothyroxine (SYNTHROID) 112 MCG tablet Take 1 tablet (112 mcg total) by mouth daily. 11/14/18   [name], MD  metformin (FORTAMET) 1000 MG (OSM) 24 hr tablet Take 1,000 mg by mouth daily with breakfast.    [provider]  mirtazapine (REMERON) 7.5 MG tablet TAKE 1 TABLET BY MOUTH EVERYDAY AT BEDTIME 02/21/20   [name], MD  Potassium Chloride ER 20 MEQ TBCR Take 1  tablet by mouth once daily 11/30/18   [name], MD  Semaglutide, 1 MG/DOSE, (OZEMPIC, 1 MG/DOSE,) 2 MG/1.5ML SOPN Inject into the skin. Inject into stomach 1 time weekly    [provider]  traZODone (DESYREL) 150 MG tablet TAKE 1 TABLET (150 MG TOTAL) BY MOUTH AT BEDTIME. 10/07/19   [name], MD    Allergies Oxycodone hcl, Albuterol, and Oxycodone hcl  Family History  Problem Relation Age of Onset  . Coarctation of the aorta Sister   . Diabetes Mother   . COPD Mother   . Obesity Mother   . Depression Mother   . Pancreatic cancer Mother   . Alcohol abuse Father   . Cirrhosis Father   . Down syndrome Brother   . Pneumonia Brother   . Down syndrome Daughter   . Lupus Daughter   . Ulcerative colitis Daughter   . Diabetes Maternal Aunt   .  Breast cancer Maternal Aunt   . Breast cancer Maternal Aunt     Social History Social History   Tobacco Use  . Smoking status: Never Smoker  . Smokeless tobacco: Never Used  Vaping Use  . Vaping Use: Never used  Substance Use Topics  . Alcohol use: Yes    Comment: occasional wine   . Drug use: No    Review of Systems  Review of Systems  Constitutional: Positive for malaise/fatigue. Negative for chills and fever.  HENT: Positive for sore throat.   Eyes: Negative for pain.  Respiratory: Negative for cough and stridor.   Cardiovascular: Negative for chest pain.  Gastrointestinal: Positive for nausea and vomiting.  Skin: Negative for rash.  Neurological: Positive for dizziness and headaches. Negative for seizures and loss of consciousness.  Psychiatric/Behavioral: Negative for suicidal ideas.  All other systems reviewed and are negative.     ____________________________________________   PHYSICAL EXAM:  VITAL SIGNS: ED Triage Vitals  Enc Vitals Group     BP 03/20/20 1314 (!) 77/46     Pulse Rate 03/20/20 1314 83     Resp 03/20/20 1314 16     Temp 03/20/20 1314 98.3 F (36.8 C)     Temp Source  03/20/20 1314 Oral     SpO2 03/20/20 1314 98 %     Weight 03/20/20 1315 139 lb 15.9 oz (63.5 kg)     Height 03/20/20 1315 5\' 1"  (1.549 m)     Head Circumference --      Peak Flow --      Pain Score 03/20/20 1315 0     Pain Loc --      Pain Edu? --      Excl. in [place]? --    Vitals:   03/20/20 1314 03/20/20 1349  BP: (!) 77/46 (!) 130/55  Pulse: 83 84  Resp: 16 19  Temp: 98.3 F (36.8 C)   SpO2: 98% 97%   Physical Exam Vitals and nursing note reviewed.  Constitutional:      General: She is not in acute distress.    Appearance: She is well-developed.  HENT:     Head: Normocephalic and atraumatic.     Right Ear: External ear normal.     Left Ear: External ear normal.     Nose: Nose normal.     Mouth/Throat:     Mouth: Mucous membranes are dry.  Eyes:     Conjunctiva/sclera: Conjunctivae normal.  Cardiovascular:     Rate and Rhythm: Normal rate and regular rhythm.     Heart sounds: Murmur heard.   Pulmonary:     Effort: Pulmonary effort is normal. No respiratory distress.     Breath sounds: Normal breath sounds.  Abdominal:     Palpations: Abdomen is soft.     Tenderness: There is no abdominal tenderness.  Musculoskeletal:     Cervical back: Neck supple.  Skin:    General: Skin is warm and dry.     Capillary Refill: Capillary refill takes 2 to 3 seconds.  Neurological:     Mental Status: She is alert and oriented to person, place, and time.  Psychiatric:        Mood and Affect: Mood normal.     Patient with symmetric rib strength.  PERRLA.  EOMI.  Cranial 2 through 12 grossly intact. ____________________________________________   LABS (all labs ordered are listed, but only abnormal results are displayed)  Labs Reviewed  LACTIC ACID, PLASMA - Abnormal; Notable for  the following components:      Result Value   Lactic Acid, Venous 2.6 (*)    All other components within normal limits  PROTIME-INR - Abnormal; Notable for the following components:   Prothrombin  Time 17.2 (*)    INR 1.5 (*)    All other components within normal limits  APTT - Abnormal; Notable for the following components:   aPTT 38 (*)    All other components within normal limits  SARS CORONAVIRUS 2 BY RT PCR (HOSPITAL ORDER, [place] [facility])  CULTURE, BLOOD (ROUTINE X 2)  CULTURE, BLOOD (ROUTINE X 2)  URINE CULTURE  MAGNESIUM  HEPATIC FUNCTION PANEL  TSH  PROCALCITONIN  LACTIC ACID, PLASMA  CORTISOL  URINALYSIS, COMPLETE (UACMP) WITH MICROSCOPIC   ____________________________________________  EKG  Ventricular paced with a ventricular rhythm of 78. ____________________________________________  RADIOLOGY  ED MD interpretation: No evidence of intracranial bleeding on CT head and chest x-ray is unremarkable.    Official radiology report(s): CT Head Wo Contrast  Result Date: 03/20/2020 CLINICAL DATA:  Hemorrhage suspected.  Headache with vomiting. EXAM: CT HEAD WITHOUT CONTRAST TECHNIQUE: Contiguous axial images were obtained from the base of the skull through the vertex without intravenous contrast. COMPARISON:  CT head 08/14/2010 FINDINGS: Brain: No evidence of acute large vascular territory infarct. Similar encephalomalacia involving the high left parietal lobe. Small remote lacunar infarct in left cerebellum. No acute hemorrhage. No hydrocephalus Vascular: Calcific atherosclerosis. No hyperdense vessel identified. Skull: Normal. Negative for fracture or focal lesion. Sinuses/Orbits: No acute finding. Other: No mastoid effusion. IMPRESSION: 1. No acute intracranial abnormality. 2. Similar left parietal encephalomalacia and remote left cerebellar lacunar infarct. Electronically Signed   By: Margaretha Sheffield MD   On: 03/20/2020 14:36   DG Chest Port 1 View  Result Date: 03/20/2020 CLINICAL DATA:  69 year old female with possible sepsis. Hypotension. History of pulmonic valve replacement. EXAM: PORTABLE CHEST 1 VIEW COMPARISON:  Chest radiographs  08/17/2017 and earlier. FINDINGS: Portable AP view at 1350 hours. Stable left chest 3 lead pacemaker. Revision of cardiac valve replacement since 2019. Stable cardiac size and mediastinal contours. Visualized tracheal air column is within normal limits. No pneumothorax, pulmonary edema, pleural effusion or acute pulmonary opacity. No acute osseous abnormality identified. Negative visible bowel gas pattern. IMPRESSION: No acute cardiopulmonary abnormality. Electronically Signed   By: Genevie Ann M.D.   On: 03/20/2020 14:11    ____________________________________________   PROCEDURES  Procedure(s) performed (including Critical Care):  .1-3 Lead EKG Interpretation Performed by: Lucrezia Starch, MD Authorized by: Lucrezia Starch, MD     Interpretation: abnormal     ECG rate assessment: normal     Rhythm: paced     Ectopy: PVCs     Conduction: abnormal     ____________________________________________   INITIAL IMPRESSION / ASSESSMENT AND PLAN / ED COURSE        Patient presents with with above history and exam for further assessment after initially presenting to clinic earlier today for assessment of headache associated with vomiting and cough.  Patient noted to be hypotensive but clinic and transported via EMS.  Patient did receive 500 cc of saline via EMS.  Labs obtained earlier this morning clinic were reviewed by self.  These include a CMP shows a glucose of 192 was otherwise unremarkable.  CBC showed WBC count of 13, hemoglobin of 15.6, and platelets of 164.  On arrival patient initially noted to be hypotensive with a systolic of 77 however once  patient moved back to ED treatment area recheck of vitals showed a BP of 130/55 with otherwise stable vital signs on room air.  CT head obtained due to patient being anticoagulated on Eliquis complaining of headache and nausea and vomiting to assess for evidence of any intracranial bleeding.  CT head unremarkable for any evidence of IC H.   Chest x-ray unremarkable for evidence of focal consolidation suggestive bacterial pneumonia, pneumothorax, edema, or other acute intrathoracic process.  Lactic acid is noted to be elevated at 2.6.  While WBC count is elevated on labs obtained this morning this is somewhat difficult to interpret given patient is on chronic steroids.  Hepatic function panel and magnesium unremarkable.  In addition to COVID-19 differential includes sepsis of unclear source.  Blood cultures obtained and urine studies ordered.  1 dose of cefepime ordered.  Given concern for possible sepsis versus dehydration from gastroenteritis versus other complication related to patient's Addison's disease I did reach out to [facility] or patient gets her endocrine care and at the request of the patient.  Per [facility] transfer center they will contact us once they have information about possible acceptance.  Care of patient signed over to oncoming provider at approximately 1530.  Plan is to transfer hopefully to [facility] as patient will require observation with consulting endocrine service available.    ____________________________________________   FINAL CLINICAL IMPRESSION(S) / ED DIAGNOSES  Final diagnoses:  Hypotension, unspecified hypotension type  Lactic acid acidosis  Pacemaker  Addison's disease ([place])  Leukocytosis, unspecified type  Person under investigation for COVID-19    Medications  lactated ringers infusion (has no administration in time range)  hydrocortisone sodium succinate (SOLU-CORTEF) 100 MG injection 50 mg (has no administration in time range)  lactated ringers bolus 1,000 mL (1,000 mLs Intravenous New Bag/Given 03/20/20 1433)  hydrocortisone sodium succinate (SOLU-CORTEF) 100 MG injection 100 mg (100 mg Intravenous Given 03/20/20 1434)  lactated ringers bolus 1,000 mL (1,000 mLs Intravenous New Bag/Given 03/20/20 1506)  ceFEPIme (MAXIPIME) 2 g in sodium chloride 0.9 % 100 mL IVPB (2 g Intravenous New Bag/Given 03/20/20  1505)     ED Discharge Orders    None       Note:  This document was prepared using Dragon voice recognition software and may include unintentional dictation errors.   Lucrezia Starch, MD 03/20/20 1537

## 2020-03-20 NOTE — ED Triage Notes (Signed)
First nurse note - sent by EMS from urgent care for possible addison crisis. Pt had low bp at urgent care. Alert and oriented for EMS. PIV by EMS

## 2020-03-20 NOTE — Telephone Encounter (Signed)
Patient was seen at [facility] then transferred to ED by EMS. Patient is currently at [facility] RECORD [facility] Patient Name: Haley Young Gender: Female DOB: 22-Dec-1950 Age: 69 Y 42 M 27 D Return Phone Number: [phone] (Primary) Address: City/State/Zip: [place] [address] Client [facility] [facility] - [place] Client Site [place] - Day Physician Deborra Medina - MD Contact Type Call Who Is Calling Patient / Member / Family / Caregiver Call Type Triage / Clinical Relationship To Patient Self Return Phone Number [phone] (Primary) Chief Complaint Headache Reason for Call Symptomatic / Request for [place] states she has a headache, glands are hurting. Coughing, yesterday she was vomiting. Has Addison Disease and is a Diabetic. Translation No Nurse Assessment Nurse: Zenia Resides, RN, Diane Date/Time Eilene Ghazi Time): 03/20/2020 7:12:26 AM Confirm and document reason for call. If symptomatic, describe symptoms. ---Caller states she has a headache (pain is a 8), weakness, coughing. Was vomiting yesterday, but not yet today. No fever. No chest pain or sob. Symptoms started yesterday with a bad headache. Able to walk. No blurred or double vision. No numbness or weakness. Has the patient had close contact with a person known or suspected to have the novel coronavirus illness OR traveled / lives in area with major community spread (including international travel) in the last 14 days from the onset of symptoms? * If Asymptomatic, screen for exposure and travel within the last 14 days. ---No Does the patient have any new or worsening symptoms? ---Yes Will a triage be completed? ---Yes Related visit to physician within the last 2 weeks? ---No Does the PT have any chronic conditions? (i.e. diabetes, asthma, this includes High risk  factors for pregnancy, etc.) ---Yes List chronic conditions. ---DM, heart condition- with surgeries, Addisons Is this a behavioral health or substance abuse call? ---No Guidelines Guideline Title Affirmed Question Affirmed Notes Nurse Date/Time (Eastern Time) Headache [1] SEVERE headache AND [2] sudden-onset (i.e., reaching maximum Zenia Resides, RN, Diane 03/20/2020 7:15:15 AM PLEASE NOTE: All timestamps contained within this report are represented as [place] Standard Time. CONFIDENTIALTY NOTICE: This fax transmission is intended only for the addressee. It contains information that is legally privileged, confidential or otherwise protected from use or disclosure. If you are not the intended recipient, you are strictly prohibited from reviewing, disclosing, copying using or disseminating any of this information or taking any action in reliance on or regarding this information. If you have received this fax in error, please notify us immediately by telephone so that we can arrange for its return to [place]. Phone: [phone], Toll-Free: [phone], Fax: [phone] Page: 2 of 2 Call Id: [id number] Guidelines Guideline Title Affirmed Question Affirmed Notes Nurse Date/Time Eilene Ghazi Time) intensity within seconds to 1 hour) Disp. Time Eilene Ghazi Time) Disposition Final User 03/20/2020 7:18:24 AM Go to ED Now (or PCP triage) Yes Zenia Resides, RN, Diane Caller Disagree/Comply Comply Caller Understands Yes PreDisposition Did not know what to do Care Advice Given Per Guideline GO TO ED NOW (OR PCP TRIAGE): * IF NO PCP (PRIMARY CARE PROVIDER) SECOND-LEVEL TRIAGE: You need to be seen within the next hour. Go to the [facility] at _____________ [place] as soon as you can. CARE ADVICE given per Headache (Adult) guideline. * It is better and safer if another adult drives instead of you. ANOTHER ADULT SHOULD DRIVE: Comments User: [name], RN Date/Time Eilene Ghazi Time): 03/20/2020 7:15:36 AM BS was  200 yesterday.  Normal for pt. is 90 Referrals GO TO FACILITY UNDECIDED

## 2020-03-20 NOTE — ED Notes (Signed)
Pt with xray 

## 2020-03-20 NOTE — ED Notes (Signed)
Spoke to [employer] at [facility], will call when bed assignment is known

## 2020-03-20 NOTE — ED Notes (Signed)
[facility]  CALLED  PER  DR  Creig Hines  MD

## 2020-03-22 LAB — URINE CULTURE

## 2020-03-23 ENCOUNTER — Other Ambulatory Visit: Payer: Self-pay | Admitting: Internal Medicine

## 2020-03-23 DIAGNOSIS — E271 Primary adrenocortical insufficiency: Secondary | ICD-10-CM | POA: Diagnosis not present

## 2020-03-23 DIAGNOSIS — E063 Autoimmune thyroiditis: Secondary | ICD-10-CM | POA: Diagnosis not present

## 2020-03-23 DIAGNOSIS — E038 Other specified hypothyroidism: Secondary | ICD-10-CM | POA: Diagnosis not present

## 2020-03-23 DIAGNOSIS — E1165 Type 2 diabetes mellitus with hyperglycemia: Secondary | ICD-10-CM | POA: Diagnosis not present

## 2020-03-25 DIAGNOSIS — I480 Paroxysmal atrial fibrillation: Secondary | ICD-10-CM | POA: Diagnosis not present

## 2020-03-25 DIAGNOSIS — I371 Nonrheumatic pulmonary valve insufficiency: Secondary | ICD-10-CM | POA: Diagnosis not present

## 2020-03-25 DIAGNOSIS — I442 Atrioventricular block, complete: Secondary | ICD-10-CM | POA: Diagnosis not present

## 2020-03-25 DIAGNOSIS — I471 Supraventricular tachycardia: Secondary | ICD-10-CM | POA: Diagnosis not present

## 2020-03-25 DIAGNOSIS — Q221 Congenital pulmonary valve stenosis: Secondary | ICD-10-CM | POA: Diagnosis not present

## 2020-03-25 LAB — CULTURE, BLOOD (ROUTINE X 2)
Culture: NO GROWTH
Culture: NO GROWTH

## 2020-03-27 DIAGNOSIS — Z45018 Encounter for adjustment and management of other part of cardiac pacemaker: Secondary | ICD-10-CM | POA: Diagnosis not present

## 2020-03-27 DIAGNOSIS — Z950 Presence of cardiac pacemaker: Secondary | ICD-10-CM | POA: Diagnosis not present

## 2020-03-27 DIAGNOSIS — I4430 Unspecified atrioventricular block: Secondary | ICD-10-CM | POA: Diagnosis not present

## 2020-04-05 ENCOUNTER — Other Ambulatory Visit: Payer: Self-pay | Admitting: Internal Medicine

## 2020-05-13 ENCOUNTER — Telehealth: Payer: Self-pay

## 2020-05-13 NOTE — Telephone Encounter (Signed)
Called patient to schedule appt with Dr Derrel Nip. Patient stated daughters group home was on the line and she would call back to schedule. Please put in appt note to discuss problem list when scheduled.

## 2020-06-12 ENCOUNTER — Encounter: Payer: Self-pay | Admitting: Internal Medicine

## 2020-06-12 ENCOUNTER — Other Ambulatory Visit: Payer: Self-pay

## 2020-06-12 ENCOUNTER — Ambulatory Visit (INDEPENDENT_AMBULATORY_CARE_PROVIDER_SITE_OTHER): Payer: PPO | Admitting: Internal Medicine

## 2020-06-12 VITALS — BP 102/64 | HR 87 | Temp 97.8°F | Resp 16 | Ht 61.0 in | Wt 136.4 lb

## 2020-06-12 DIAGNOSIS — Z23 Encounter for immunization: Secondary | ICD-10-CM | POA: Diagnosis not present

## 2020-06-12 DIAGNOSIS — Z0001 Encounter for general adult medical examination with abnormal findings: Secondary | ICD-10-CM

## 2020-06-12 DIAGNOSIS — Z794 Long term (current) use of insulin: Secondary | ICD-10-CM

## 2020-06-12 DIAGNOSIS — B373 Candidiasis of vulva and vagina: Secondary | ICD-10-CM | POA: Diagnosis not present

## 2020-06-12 DIAGNOSIS — Z1231 Encounter for screening mammogram for malignant neoplasm of breast: Secondary | ICD-10-CM | POA: Diagnosis not present

## 2020-06-12 DIAGNOSIS — B3731 Acute candidiasis of vulva and vagina: Secondary | ICD-10-CM

## 2020-06-12 DIAGNOSIS — Z0000 Encounter for general adult medical examination without abnormal findings: Secondary | ICD-10-CM

## 2020-06-12 DIAGNOSIS — E271 Primary adrenocortical insufficiency: Secondary | ICD-10-CM

## 2020-06-12 DIAGNOSIS — E785 Hyperlipidemia, unspecified: Secondary | ICD-10-CM | POA: Diagnosis not present

## 2020-06-12 DIAGNOSIS — E1165 Type 2 diabetes mellitus with hyperglycemia: Secondary | ICD-10-CM | POA: Diagnosis not present

## 2020-06-12 DIAGNOSIS — E1169 Type 2 diabetes mellitus with other specified complication: Secondary | ICD-10-CM | POA: Diagnosis not present

## 2020-06-12 MED ORDER — FLUCONAZOLE 150 MG PO TABS: 150.0000 mg | ORAL_TABLET | Freq: Every day | ORAL | 0 refills | Status: DC

## 2020-06-12 NOTE — Progress Notes (Addendum)
Patient ID: Haley Young, female    DOB: 06/19/1951  Age: 69 y.o. MRN: [id number]  The patient is here for annual preventive examination and management of other chronic and acute problems.  This visit occurred during the SARS-CoV-2 public health emergency.  Safety protocols were in place, including screening questions prior to the visit, additional usage of staff PPE, and extensive cleaning of exam room while observing appropriate contact time as indicated for disinfecting solutions.    Patient has received both doses of the available COVID 19 vaccine without complications.  Patient continues to mask when outside of the home except when walking in yard or at safe distances from others .  Patient denies any change in mood or development of unhealthy behaviors resuting from the pandemic's restriction of activities and socialization.     The risk factors are reflected in the social history.  The roster of all physicians providing medical care to patient - is listed in the Snapshot section of the chart.  Activities of daily living:  The patient is 100% independent in all ADLs: dressing, toileting, feeding as well as independent mobility  Home safety : The patient has smoke detectors in the home. They wear seatbelts.  There are no firearms at home. There is no violence in the home.   There is no risks for hepatitis, STDs or HIV. There is no   history of blood transfusion. They have no travel history to infectious disease endemic areas of the world.  The patient has seen their dentist in the last six month. They have seen their eye doctor in the last year. They admit to slight hearing difficulty with regard to whispered voices and some television programs.  They have deferred audiologic testing in the last year.  They do not  have excessive sun exposure. Discussed the need for sun protection: hats, long sleeves and use of sunscreen if there is significant sun exposure.   Diet: the importance of a  healthy diet is discussed. They do have a healthy diet.  The benefits of regular aerobic exercise were discussed. She walks 4 times per week ,  20 minutes.   Depression screen: there are no signs or vegative symptoms of depression- irritability, change in appetite, anhedonia, sadness/tearfullness.  Cognitive assessment: the patient manages all their financial and personal affairs and is actively engaged. They could relate day,date,year and events; recalled 2/3 objects at 3 minutes; performed clock-face test normally.  The following portions of the patient's history were reviewed and updated as appropriate: allergies, current medications, past family history, past medical history,  past surgical history, past social history  and problem list.  Visual acuity was not assessed per patient preference since she has regular follow up with her ophthalmologist. Hearing and body mass index were assessed and reviewed.   During the course of the visit the patient was educated and counseled about appropriate screening and preventive services including : fall prevention , diabetes screening, nutrition counseling, colorectal cancer screening, and recommended immunizations.    CC: The primary encounter diagnosis was Breast cancer screening by mammogram. Diagnoses of Type 2 diabetes mellitus with hyperglycemia, with long-term current use of insulin ([place]), Need for immunization against influenza, Candida vaginitis, Adrenal insufficiency (Addison's disease) ([place]), and Encounter for preventive health examination were also pertinent to this visit.   1) pacemaker adjustment done recently  2) Endocrine managing T2M. Metformin and Glipizide  Stopped; using Jardiance and Lantus 17 units.  Using novolog   5 units at bedtime  .  not taking at lunch bc she skips lunch   Ozempic and Jardiance as well.  Fasting sugars are 250   Last a1c was 9   3) Addisons:  Has recurrent hypotension.  Reviewed Se[t hospitalization .  Takes 10  mg HC  In am and 5 mg at night.  Adds 5 mg more if she feels stressed  4) Vaginal yeast infection for the last 3 weeks:   History Haley Young has a past medical history of Asthma, Chicken pox, Depression, Diabetes mellitus, Frequent UTI, GERD (gastroesophageal reflux disease), Heart disease, Heart murmur, Heart valve replaced, colonic polyps, Hyperhidrosis, Hyperlipidemia, Lipoma, MRSA cellulitis, Osteoporosis, Pyelocystitis, Thyroid disease, and Urinary incontinence.   She has a past surgical history that includes Partial hysterectomy (1970); Cardiac surgery (1955, 2005); Valve replacement; Pulmonary vein stenosis repair; and Abdominal hysterectomy (1970).   Her family history includes Alcohol abuse in her father; Breast cancer in her maternal aunt and maternal aunt; COPD in her mother; Cirrhosis in her father; Coarctation of the aorta in her sister; Depression in her mother; Diabetes in her maternal aunt and mother; Down syndrome in her brother and daughter; Lupus in her daughter; Obesity in her mother; Pancreatic cancer in her mother; Pneumonia in her brother; Ulcerative colitis in her daughter.She reports that she has never smoked. She has never used smokeless tobacco. She reports current alcohol use. She reports that she does not use drugs.  Outpatient Medications Prior to Visit  Medication Sig Dispense Refill  . apixaban (ELIQUIS) 5 MG TABS tablet Take 1 tablet (5 mg total) by mouth 2 (two) times daily. 60 tablet 11  . atorvastatin (LIPITOR) 40 MG tablet Take 40 mg by mouth daily.    . Fludrocortisone Acetate (FLORINEF PO) Take 0.1 mg by mouth daily.    Marland Kitchen FLUoxetine (PROZAC) 10 MG tablet TAKE 1 TABLET BY MOUTH EVERY DAY 90 tablet 1  . furosemide (LASIX) 20 MG tablet Take 20 mg by mouth daily.     . hydrocortisone (CORTEF) 10 MG tablet Take 5 mg by mouth 2 (two) times daily. 10 mg QAM, 5 mg lunch, 5 PM    . Insulin Glargine (LANTUS SOLOSTAR) 100 UNIT/ML Solostar Pen Inject 15 Units into the skin  daily. ADJUST UPWARD AS NEEDED EVERY 3 DAYS (Patient taking differently: Inject 13 Units into the skin daily. ADJUST UPWARD AS NEEDED EVERY 3 DAYS) 5 pen PRN  . JARDIANCE 10 MG TABS tablet Take 10 mg by mouth every morning.    Marland Kitchen levothyroxine (SYNTHROID) 100 MCG tablet Take 100 mcg by mouth daily.    . metoprolol succinate (TOPROL-XL) 25 MG 24 hr tablet Take 25 mg by mouth daily.    . mirtazapine (REMERON) 7.5 MG tablet TAKE 1 TABLET BY MOUTH EVERYDAY AT BEDTIME 90 tablet 1  . Semaglutide, 1 MG/DOSE, (OZEMPIC, 1 MG/DOSE,) 2 MG/1.5ML SOPN Inject into the skin. Inject into stomach 1 time weekly    . traZODone (DESYREL) 150 MG tablet TAKE 1 TABLET (150 MG TOTAL) BY MOUTH AT BEDTIME. 90 tablet 1  . glipiZIDE (GLUCOTROL) 10 MG tablet Take 10 mg by mouth at bedtime.  (Patient not taking: Reported on 06/12/2020)    . metFORMIN (GLUCOPHAGE) 1000 MG tablet Take 1,000 mg by mouth 2 (two) times daily. (Patient not taking: Reported on 06/12/2020)     No facility-administered medications prior to visit.    Review of Systems   Patient denies headache, fevers, malaise, unintentional weight loss, skin rash, eye pain, sinus congestion and sinus pain, sore  throat, dysphagia,  hemoptysis , cough, dyspnea, wheezing, chest pain, palpitations, orthopnea, edema, abdominal pain, nausea, melena, diarrhea, constipation, flank pain, dysuria, hematuria, urinary  Frequency, nocturia, numbness, tingling, seizures,  Focal weakness, Loss of consciousness,  Tremor, insomnia, depression, anxiety, and suicidal ideation.     Objective:  BP 102/64 (BP Location: Left Arm, Patient Position: Sitting, Cuff Size: Normal)   Pulse 87   Temp 97.8 F (36.6 C) (Oral)   Resp 16   Ht 5\' 1"  (1.549 m)   Wt 136 lb 6.4 oz (61.9 kg)   SpO2 95%   BMI 25.77 kg/m   Physical Exam  General appearance: alert, cooperative and appears stated age Ears: normal TM's and external ear canals both ears Throat: lips, mucosa, and tongue normal; teeth  and gums normal Neck: no adenopathy, no carotid bruit, supple, symmetrical, trachea midline and thyroid not enlarged, symmetric, no tenderness/mass/nodules Back: symmetric, no curvature. ROM normal. No CVA tenderness. Lungs: clear to auscultation bilaterally Heart: regular rate and rhythm, S1, S2 normal, no murmur, click, rub or gallop Abdomen: soft, non-tender; bowel sounds normal; no masses,  no organomegaly Pulses: 2+ and symmetric Skin: Skin color, texture, turgor normal. No rashes or lesions Lymph nodes: Cervical, supraclavicular, and axillary nodes normal.  Assessment & Plan:   Problem List Items Addressed This Visit      Unprioritized   Type 2 diabetes mellitus with hyperglycemia, with long-term current use of insulin ([facility]CC)    Loss of control noted despite management by Endicrinology.  fasitng sugars have been consistently > 200.  She does not check any other time and has not been using the mealtime insulin .She requests that I manage her diabetes going forward.  Recommend increase in Lantus to 20 units daily and start using 5 units of novolog before evening meal, and add 5 units before breakfast .  Refer to CCM fo rhelp in managing and assistance in getting CBG monitor .  Lab Results  Component Value Date   HGBA1C 13.2 (H) 06/12/2020         Relevant Orders   Ambulatory referral to Chronic Care Management Services   Hemoglobin A1c (Completed)   Lipid panel (Completed)   Comprehensive metabolic panel (Completed)   Microalbumin / creatinine urine ratio (Completed)   Encounter for preventive health examination    age appropriate education and counseling updated, referrals for preventative services and immunizations addressed, dietary and smoking counseling addressed, most recent labs reviewed.  I have personally reviewed and have noted:  1) the patient's medical and social history 2) The pt's use of alcohol, tobacco, and illicit drugs 3) The patient's current medications and  supplements 4) Functional ability including ADL's, fall risk, home safety risk, hearing and visual impairment 5) Diet and physical activities 6) Evidence for depression or mood disorder 7) The patient's height, weight, and BMI have been recorded in the chart  I have made referrals, and provided counseling and education based on review of the above      Candida vaginitis    Fluconazole empirically prescribed       Relevant Medications   fluconazole (DIFLUCAN) 150 MG tablet   Adrenal insufficiency (Addison's disease) ([place])     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   Continue hydrocortisone twice daily with stress dosing instructions reviewed.  continue florinef as well.   .  Checking lytes, today,  Sodium slightly low and K at upper limit of normal due to AI.  Low potassium diet recommended.  Lab Results  Component Value  Date   CREATININE 1.16 (H) 06/12/2020   Lab Results  Component Value Date   NA 133 (L) 06/12/2020   K 5.0 06/12/2020   CL 95 (L) 06/12/2020   CO2 28 06/12/2020          Other Visit Diagnoses    Breast cancer screening by mammogram    -  Primary   Relevant Orders   MM 3D SCREEN BREAST BILATERAL   Need for immunization against influenza       Relevant Orders   Flu Vaccine QUAD High Dose(Fluad) (Completed)      I have discontinued Zigmund Daniel S. Dejager's metFORMIN and glipiZIDE. I am also having her start on fluconazole. Additionally, I am having her maintain her atorvastatin, hydrocortisone, furosemide, apixaban, Semaglutide (1 MG/DOSE), Fludrocortisone Acetate (FLORINEF PO), Lantus SoloStar, mirtazapine, Jardiance, metoprolol succinate, levothyroxine, FLUoxetine, and traZODone.  Meds ordered this encounter  Medications  . fluconazole (DIFLUCAN) 150 MG tablet    Sig: Take 1 tablet (150 mg total) by mouth daily.    Dispense:  2 tablet    Refill:  0    Medications Discontinued During This Encounter  Medication Reason  . glipiZIDE (GLUCOTROL) 10 MG tablet   .  metFORMIN (GLUCOPHAGE) 1000 MG tablet     Follow-up: No follow-ups on file.   [name], MD

## 2020-06-12 NOTE — Patient Instructions (Addendum)
Move the Novolog to dinner time. 5 units  increase lantus to 20 units daily   Add 5 units of novolog with breakfast if BS is > 200  Check Blood sugars 2 hrs after dinner and fasting AM   BP should stay above 90/60 (blood pressure)  Your annual mammogram has been ordered and is due NOW .  You are encouraged (required) to call to make your appointment at [facility] [phone]

## 2020-06-13 LAB — MICROALBUMIN / CREATININE URINE RATIO
Creatinine, Urine: 34 mg/dL (ref 20–275)
Microalb Creat Ratio: 15 mcg/mg creat (ref <30)
Microalb, Ur: 0.5 mg/dL

## 2020-06-13 LAB — COMPREHENSIVE METABOLIC PANEL
AG Ratio: 1.2 (calc) (ref 1.0–2.5)
ALT: 32 U/L — ABNORMAL HIGH (ref 6–29)
AST: 36 U/L — ABNORMAL HIGH (ref 10–35)
Albumin: 4 g/dL (ref 3.6–5.1)
Alkaline phosphatase (APISO): 110 U/L (ref 37–153)
BUN/Creatinine Ratio: 21 (calc) (ref 6–22)
BUN: 24 mg/dL (ref 7–25)
CO2: 28 mmol/L (ref 20–32)
Calcium: 10.2 mg/dL (ref 8.6–10.4)
Chloride: 95 mmol/L — ABNORMAL LOW (ref 98–110)
Creat: 1.16 mg/dL — ABNORMAL HIGH (ref 0.60–0.93)
Globulin: 3.3 g/dL (calc) (ref 1.9–3.7)
Glucose, Bld: 317 mg/dL — ABNORMAL HIGH (ref 65–99)
Potassium: 5 mmol/L (ref 3.5–5.3)
Sodium: 133 mmol/L — ABNORMAL LOW (ref 135–146)
Total Bilirubin: 0.5 mg/dL (ref 0.2–1.2)
Total Protein: 7.3 g/dL (ref 6.1–8.1)

## 2020-06-13 LAB — HEMOGLOBIN A1C
Hgb A1c MFr Bld: 13.2 % of total Hgb — ABNORMAL HIGH (ref <5.7)
Mean Plasma Glucose: 332 (calc)
eAG (mmol/L): 18.4 (calc)

## 2020-06-13 LAB — LIPID PANEL
Cholesterol: 182 mg/dL (ref <200)
HDL: 48 mg/dL — ABNORMAL LOW (ref >=50)
LDL Cholesterol (Calc): 112 mg/dL (calc) — ABNORMAL HIGH
Non-HDL Cholesterol (Calc): 134 mg/dL (calc) — ABNORMAL HIGH (ref <130)
Total CHOL/HDL Ratio: 3.8 (calc) (ref <5.0)
Triglycerides: 114 mg/dL (ref <150)

## 2020-06-14 DIAGNOSIS — B373 Candidiasis of vulva and vagina: Secondary | ICD-10-CM | POA: Insufficient documentation

## 2020-06-14 DIAGNOSIS — B3731 Acute candidiasis of vulva and vagina: Secondary | ICD-10-CM | POA: Insufficient documentation

## 2020-06-14 NOTE — Assessment & Plan Note (Addendum)
Loss of control noted despite management by Endicrinology.  fasitng sugars have been consistently > 200.  She does not check any other time and has not been using the mealtime insulin .She requests that I manage her diabetes going forward.  Recommend increase in Lantus to 20 units daily and start using 5 units of novolog before evening meal, and add 5 units before breakfast .  Refer to CCM fo rhelp in managing and assistance in getting CBG monitor .  Lab Results  Component Value Date   HGBA1C 13.2 (H) 06/12/2020

## 2020-06-14 NOTE — Progress Notes (Incomplete)
Patient ID: Haley Young, female    DOB: May 18, 1951  Age: 69 y.o. MRN: [id number]  The patient is here for annual preventive examination and management of other chronic and acute problems.  This visit occurred during the SARS-CoV-2 public health emergency.  Safety protocols were in place, including screening questions prior to the visit, additional usage of staff PPE, and extensive cleaning of exam room while observing appropriate contact time as indicated for disinfecting solutions.    Patient has received both doses of the available COVID 19 vaccine without complications.  Patient continues to mask when outside of the home except when walking in yard or at safe distances from others .  Patient denies any change in mood or development of unhealthy behaviors resuting from the pandemic's restriction of activities and socialization.     The risk factors are reflected in the social history.  The roster of all physicians providing medical care to patient - is listed in the Snapshot section of the chart.  Activities of daily living:  The patient is 100% independent in all ADLs: dressing, toileting, feeding as well as independent mobility  Home safety : The patient has smoke detectors in the home. They wear seatbelts.  There are no firearms at home. There is no violence in the home.   There is no risks for hepatitis, STDs or HIV. There is no   history of blood transfusion. They have no travel history to infectious disease endemic areas of the world.  The patient has seen their dentist in the last six month. They have seen their eye doctor in the last year. They admit to slight hearing difficulty with regard to whispered voices and some television programs.  They have deferred audiologic testing in the last year.  They do not  have excessive sun exposure. Discussed the need for sun protection: hats, long sleeves and use of sunscreen if there is significant sun exposure.   Diet: the importance of a  healthy diet is discussed. They do have a healthy diet.  The benefits of regular aerobic exercise were discussed. She walks 4 times per week ,  20 minutes.   Depression screen: there are no signs or vegative symptoms of depression- irritability, change in appetite, anhedonia, sadness/tearfullness.  Cognitive assessment: the patient manages all their financial and personal affairs and is actively engaged. They could relate day,date,year and events; recalled 2/3 objects at 3 minutes; performed clock-face test normally.  The following portions of the patient's history were reviewed and updated as appropriate: allergies, current medications, past family history, past medical history,  past surgical history, past social history  and problem list.  Visual acuity was not assessed per patient preference since she has regular follow up with her ophthalmologist. Hearing and body mass index were assessed and reviewed.   During the course of the visit the patient was educated and counseled about appropriate screening and preventive services including : fall prevention , diabetes screening, nutrition counseling, colorectal cancer screening, and recommended immunizations.    CC: There were no encounter diagnoses.   1) pacemaker adjustment done recently  2) Endocrine managing T2M. Metformin and Glipizide  Stopped; using Jardiance and Lantus 17 units.  Using novolog   5 units at bedtime  .not taking at lunch bc she skips lunch   Ozempic and Jardiance as well.  Fasting sugars are 250   Last a1c was 9   3) Addisons:  Has recurrent hypotension.  Takes 10 mg HC  In am and 5  mg at night.  Adds 5 mg more if she feels stressed  4) Vaginal yeast infection for the last 3 weeks:   History Haley Young has a past medical history of Asthma, Chicken pox, Depression, Diabetes mellitus, Frequent UTI, GERD (gastroesophageal reflux disease), Heart disease, Heart murmur, Heart valve replaced, colonic polyps, Hyperhidrosis,  Hyperlipidemia, Lipoma, MRSA cellulitis, Osteoporosis, Pyelocystitis, Thyroid disease, and Urinary incontinence.   She has a past surgical history that includes Partial hysterectomy (1970); Cardiac surgery (1955, 2005); Valve replacement; Pulmonary vein stenosis repair; and Abdominal hysterectomy (1970).   Her family history includes Alcohol abuse in her father; Breast cancer in her maternal aunt and maternal aunt; COPD in her mother; Cirrhosis in her father; Coarctation of the aorta in her sister; Depression in her mother; Diabetes in her maternal aunt and mother; Down syndrome in her brother and daughter; Lupus in her daughter; Obesity in her mother; Pancreatic cancer in her mother; Pneumonia in her brother; Ulcerative colitis in her daughter.She reports that she has never smoked. She has never used smokeless tobacco. She reports current alcohol use. She reports that she does not use drugs.  Outpatient Medications Prior to Visit  Medication Sig Dispense Refill  . apixaban (ELIQUIS) 5 MG TABS tablet Take 1 tablet (5 mg total) by mouth 2 (two) times daily. 60 tablet 11  . atorvastatin (LIPITOR) 40 MG tablet Take 40 mg by mouth daily.    . Fludrocortisone Acetate (FLORINEF PO) Take 0.1 mg by mouth daily.    Marland Kitchen FLUoxetine (PROZAC) 10 MG tablet TAKE 1 TABLET BY MOUTH EVERY DAY 90 tablet 1  . furosemide (LASIX) 20 MG tablet Take 20 mg by mouth daily.     . hydrocortisone (CORTEF) 10 MG tablet Take 5 mg by mouth 2 (two) times daily. 10 mg QAM, 5 mg lunch, 5 PM    . Insulin Glargine (LANTUS SOLOSTAR) 100 UNIT/ML Solostar Pen Inject 15 Units into the skin daily. ADJUST UPWARD AS NEEDED EVERY 3 DAYS (Patient taking differently: Inject 13 Units into the skin daily. ADJUST UPWARD AS NEEDED EVERY 3 DAYS) 5 pen PRN  . JARDIANCE 10 MG TABS tablet Take 10 mg by mouth every morning.    Marland Kitchen levothyroxine (SYNTHROID) 100 MCG tablet Take 100 mcg by mouth daily.    . metoprolol succinate (TOPROL-XL) 25 MG 24 hr tablet  Take 25 mg by mouth daily.    . mirtazapine (REMERON) 7.5 MG tablet TAKE 1 TABLET BY MOUTH EVERYDAY AT BEDTIME 90 tablet 1  . Semaglutide, 1 MG/DOSE, (OZEMPIC, 1 MG/DOSE,) 2 MG/1.5ML SOPN Inject into the skin. Inject into stomach 1 time weekly    . traZODone (DESYREL) 150 MG tablet TAKE 1 TABLET (150 MG TOTAL) BY MOUTH AT BEDTIME. 90 tablet 1  . glipiZIDE (GLUCOTROL) 10 MG tablet Take 10 mg by mouth at bedtime.     . metFORMIN (GLUCOPHAGE) 1000 MG tablet Take 1,000 mg by mouth 2 (two) times daily.     No facility-administered medications prior to visit.    Review of Systems  Objective:  BP 102/64 (BP Location: Left Arm, Patient Position: Sitting, Cuff Size: Normal)   Pulse 87   Temp 97.8 F (36.6 C) (Oral)   Resp 16   Ht 5\' 1"  (1.549 m)   Wt 136 lb 6.4 oz (61.9 kg)   SpO2 95%   BMI 25.77 kg/m   Physical Exam    Assessment & Plan:   Problem List Items Addressed This Visit    None  I have discontinued Glory Graefe. Reichl's metFORMIN and glipiZIDE. I am also having her maintain her atorvastatin, hydrocortisone, furosemide, apixaban, Semaglutide (1 MG/DOSE), Fludrocortisone Acetate (FLORINEF PO), Lantus SoloStar, mirtazapine, Jardiance, metoprolol succinate, levothyroxine, FLUoxetine, and traZODone.  No orders of the defined types were placed in this encounter.   Medications Discontinued During This Encounter  Medication Reason  . glipiZIDE (GLUCOTROL) 10 MG tablet   . metFORMIN (GLUCOPHAGE) 1000 MG tablet     Follow-up: No follow-ups on file.   [name], MD

## 2020-06-14 NOTE — Assessment & Plan Note (Signed)
Fluconazole empirically prescribed

## 2020-06-15 ENCOUNTER — Telehealth: Payer: Self-pay

## 2020-06-15 MED ORDER — ATORVASTATIN CALCIUM 80 MG PO TABS
80.0000 mg | ORAL_TABLET | Freq: Every day | ORAL | 1 refills | Status: DC
Start: 2020-06-15 — End: 2020-09-28

## 2020-06-15 NOTE — Addendum Note (Signed)
Addended by: [name] on: 06/15/2020 11:30 AM   Modules accepted: Orders

## 2020-06-15 NOTE — Assessment & Plan Note (Addendum)
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   Continue hydrocortisone twice daily with stress dosing instructions reviewed.  continue florinef as well.   .  Checking lytes, today,  Sodium slightly low and K at upper limit of normal due to AI.  Low potassium diet recommended.  Lab Results  Component Value Date   CREATININE 1.16 (H) 06/12/2020   Lab Results  Component Value Date   NA 133 (L) 06/12/2020   K 5.0 06/12/2020   CL 95 (L) 06/12/2020   CO2 28 06/12/2020

## 2020-06-15 NOTE — Assessment & Plan Note (Signed)
LDL not at goal on 40 mg Lipitor.  Dose increased to 80 mg daily

## 2020-06-15 NOTE — Progress Notes (Signed)
Your a1c is through the roof at 13.  We need to aggressively manage your diabetes as well as your cholesterol which is too high given your cardiac history.   Dr. Courtney Heys will be scheduling a meeting with you very soon .  She is our clinical pharmacist  that I mentioned at our visit who can help you get a CBG monitori and adjust medications between visits with me. .  In the meantime,  please increase your atorvastatin to 80 mg  .  New rx wll be sent   Regards,   Deborra Medina, MD

## 2020-06-15 NOTE — Assessment & Plan Note (Signed)

## 2020-06-15 NOTE — Chronic Care Management (AMB) (Signed)
  Chronic Care Management   Note  06/15/2020 Name: Haley Young MRN: [id number] DOB: 1950-11-09  Haley Young is a 69 y.o. year old female who is a primary care patient of Derrel Nip, Aris Everts, MD. I reached out to [name] by phone today in response to a referral sent by [name] PCP, Dr. Derrel Nip     [name] was given information about Chronic Care Management services today including:  1. CCM service includes personalized support from designated clinical staff supervised by her physician, including individualized plan of care and coordination with other care providers 2. 24/7 contact phone numbers for assistance for urgent and routine care needs. 3. Service will only be billed when office clinical staff spend 20 minutes or more in a month to coordinate care. 4. Only one practitioner may furnish and bill the service in a calendar month. 5. The patient may stop CCM services at any time (effective at the end of the month) by phone call to the office staff. 6. The patient will be responsible for cost sharing (co-pay) of up to 20% of the service fee (after annual deductible is met).  Patient agreed to services and verbal consent obtained.   Follow up plan: Telephone appointment with care management team member scheduled for:06/16/2020  Noreene Larsson, [place], [place], [place] [address] Direct Dial: [phone] [email or web address]: [email or web address]

## 2020-06-16 ENCOUNTER — Ambulatory Visit: Payer: PPO | Admitting: Pharmacist

## 2020-06-16 DIAGNOSIS — E1169 Type 2 diabetes mellitus with other specified complication: Secondary | ICD-10-CM

## 2020-06-16 DIAGNOSIS — I482 Chronic atrial fibrillation, unspecified: Secondary | ICD-10-CM

## 2020-06-16 DIAGNOSIS — E271 Primary adrenocortical insufficiency: Secondary | ICD-10-CM

## 2020-06-16 DIAGNOSIS — E039 Hypothyroidism, unspecified: Secondary | ICD-10-CM

## 2020-06-16 DIAGNOSIS — E1165 Type 2 diabetes mellitus with hyperglycemia: Secondary | ICD-10-CM

## 2020-06-16 DIAGNOSIS — F339 Major depressive disorder, recurrent, unspecified: Secondary | ICD-10-CM

## 2020-06-16 DIAGNOSIS — Z794 Long term (current) use of insulin: Secondary | ICD-10-CM

## 2020-06-16 MED ORDER — TRESIBA FLEXTOUCH 200 UNIT/ML ~~LOC~~ SOPN: PEN_INJECTOR | SUBCUTANEOUS | 2 refills | Status: DC

## 2020-06-16 MED ORDER — OZEMPIC (1 MG/DOSE) 4 MG/3ML ~~LOC~~ SOPN: 1.0000 mg | PEN_INJECTOR | SUBCUTANEOUS | 3 refills | Status: DC

## 2020-06-16 MED ORDER — FREESTYLE LIBRE 2 READER DEVI: 1 refills | Status: DC

## 2020-06-16 MED ORDER — FREESTYLE LIBRE 2 SENSOR MISC: 11 refills | Status: DC

## 2020-06-16 NOTE — Patient Instructions (Signed)
Visit Information  Patient Care Plan: Medication Management    Problem Identified: Diabetes, Addison's Disease, Atrial Fibrillation, Osteoporosis     Long-Range Goal: Disease Progression Prevention   This Visit's Progress: On track  Priority: High  Note:   Current Barriers:  . Unable to independently monitor therapeutic efficacy . Unable to achieve control of diabetes  . Complex patient with multiple comorbidities, including diabetes, Addison's disease, atrial fibrillation, hx pulmonic valve replacement, osteoporosis  Pharmacist Clinical Goal(s):  Marland Kitchen Over the next 90 days, patient will verbalize ability to afford treatment regimen. . Over the next 90 days, patient will achieve adherence to monitoring guidelines and medication adherence to achieve therapeutic efficacy. . Over the next 90 days, patient will achieve control of diabetes as evidenced by    improvement in A1c through collaboration with PharmD and provider.   Interventions: . Inter-disciplinary care team collaboration (see longitudinal plan of care) . Comprehensive medication review performed; medication list updated in electronic medical record  Diabetes: . Uncontrolled/controlled; current treatment: Ozempic 0.5 mg (prescribed 1 mg but patient actually taking 0.5 mg weekly), Lantus 17 units QPM, Jardiance 10 mg daily, Novolog 3 units TID before meals o Hx metformin, but discontinued by endocrinology at recent hospitalization d/t elevated lactic acid and risk of lactic acidosis . Current glucose readings: fasting glucose: 200s+ post prandial glucose: 200s+; interested in CGM . Denies hypoglycemic symptoms. Endorses significant polyuria and nocturia, polydipsia, polyphagia. . Denies cost concerns at this time . Counseled on goal A1c, goal fasting, and goal 2 hour post prandial glucose. Educated on benefit of continuous glucose monitoring. [facility], patient scheduled for RN visit for CGM teaching. May be able to  use Android phone as reader, but script sent for reader as well. If patient can use phone and connect remotely to [facility], our f/u visit will be telephone; if not, our follow up will be face to face . Reviewed principals of insulin dosing, mechanisms of each agent.  . Increase Ozempic to 1 mg weekly. Script sent to pharmacy. D/c Lantus, start Tyler Aas due more prolonged duration of action and anticipated greater benefit. Start Tresiba 20 units daily. Increase Novolog to 5 units TID. Continue Jardiance 10 mg daily for now, as previous yeast infection has cleared. If determined to be insulin-dependent in the future, would d/c Jardiance d/t higher incidence of ketoacidosis w/ SGLT2 in T1DM . Discussed availability of patient assistance programs. Patient will investigate her household income and we may consider in the future. . Reviewed previous lab work. C peptide present, but low. Previously negative for GAD and insulin autoantibodies. Could consider recheck of c-peptide, checking islet cell autoantibodies to determine endogenous insulin secretion and evaluate for autoimmune component, especially considering concurrent adrenal and thyroid disease. Will discuss w/ PCP.   Atrial Fibrillation, hx pulmonic valve replacement . Appropriately managed; current treatment: metoprolol succinate 25 mg daily, furosemide 20 mg daily.  . Anticoagulant regimen: Eliquis 5 mg BID . Recent UACR does not indicate indication for ACEi/ARB tx at this time . Recommended to continue current regimen at this time  Hyperlipidemia: . Uncontrolled, though anticipated to improve given recent dose increase; current treatment: atorvastatin 80 mg daily  . Recommended to continue current regimen at this time  Addison's Disease . Appropriately managed; current treatment; hydrocortisone 10 mg QAM, 5 mg QPM; fludrocortisone 0.1 mg daily . Continue current regimen at this time  Depression/anxiety/insomnia: . Appropriately managed;  current treatment: fluoxetine 10 mg daily, trazodone 150 mg QPM, mirtazapine  7.5 mg daily  . Continue current regimen at this time. Continue to monitor for s/sx serotonin syndrome given high serotonergic burden. Monitor weight gain, appetite w/ mirtazapine in light of uncontrolled diabetes.   Hypothyroidism:: . Appropriately managed; levothyroxine 100 mcg daily . Continue current regimen at this time  Osteoporosis: . Untreated; Will discuss treatment, including appropriate calcium + vitamin D and falls risk reduction, moving forward.   Patient Goals/Self-Care Activities . Over the next 30 days, patient will:  - take medications as prescribed check blood glucose TID using CGM, document, and provide at future appointments  Follow Up Plan: Telephone follow up appointment with care management team member scheduled for:~ 3 weeks      Ms. Ingles was given information about Chronic Care Management services today including:  1. CCM service includes personalized support from designated clinical staff supervised by her physician, including individualized plan of care and coordination with other care providers 2. 24/7 contact phone numbers for assistance for urgent and routine care needs. 3. Service will only be billed when office clinical staff spend 20 minutes or more in a month to coordinate care. 4. Only one practitioner may furnish and bill the service in a calendar month. 5. The patient may stop CCM services at any time (effective at the end of the month) by phone call to the office staff. 6. The patient will be responsible for cost sharing (co-pay) of up to 20% of the service fee (after annual deductible is met).  Patient agreed to services and verbal consent obtained.   The patient verbalized understanding of instructions, educational materials, and care plan provided today and declined offer to receive copy of patient instructions, educational materials, and care plan.    Plan:  Telephone follow up appointment with care management team member scheduled for: ~ 3 weeks  Catie Darnelle Maffucci, PharmD, [place], [place] Pharmacist [place] [phone]

## 2020-06-16 NOTE — Chronic Care Management (AMB) (Signed)
Chronic Care Management   Pharmacy Note  06/16/2020 Name: Haley Young MRN: [id number] DOB: [birth year]   Subjective:  Haley Young is a 69 y.o. year old female who is a primary care patient of Tullo, Aris Everts, MD. The CCM team was consulted for assistance with chronic disease management and care coordination needs.    Engaged with patient by telephone for initial visit in response to provider referral for pharmacy case management and/or care coordination services.   Consent to Services:  Haley Young was given the following information about Chronic Care Management services today, agreed to services, and gave verbal consent: 1.CCM service includes personalized support from designated clinical staff supervised by her physician, including individualized plan of care and coordination with other care providers 2. 24/7 contact phone numbers for assistance for urgent and routine care needs. 3. Service will only be billed when office clinical staff spend 20 minutes or more in a month to coordinate care. 4. Only one practitioner may furnish and bill the service in a calendar month. 5.The patient may stop CCM services at any time (effective at the end of the month) by phone call to the office staff. 6. The patient will be responsible for cost sharing (co-pay) of up to 20% of the service fee (after annual deductible is met).  SDOH (Social Determinants of Health) assessments and interventions performed:  SDOH Interventions     Most Recent Value  SDOH Interventions  Financial Strain Interventions Other (Comment)  [discussed patient assistance]       Objective:  Lab Results  Component Value Date   CREATININE 1.16 (H) 06/12/2020   CREATININE 1.17 (H) 03/20/2020   CREATININE 0.87 05/02/2019    Lab Results  Component Value Date   HGBA1C 13.2 (H) 06/12/2020       Component Value Date/Time   CHOL 182 06/12/2020 1625   TRIG 114 06/12/2020 1625   HDL 48 (L) 06/12/2020 1625   CHOLHDL 3.8  06/12/2020 1625   VLDL 18.0 05/02/2019 1102   LDLCALC 112 (H) 06/12/2020 1625   LDLDIRECT 165.0 04/14/2016 1012    Lab Results  Component Value Date   TSH 4.112 03/20/2020        CHA2DS2-VASc Score = 7  This indicates a 11.2% annual risk of stroke. The patient's score is based upon: CHF History: 1 HTN History: 1 Diabetes History: 1 Stroke History: 2 Vascular Disease History: 0 Age Score: 1 Gender Score: 1  BP Readings from Last 3 Encounters:  06/12/20 102/64  03/20/20 (!) 143/65  03/20/20 (!) 82/58    Assessment/Interventions: Review of patient past medical history, allergies, medications, health status, including review of consultants reports, laboratory and other test data, was performed as part of comprehensive evaluation and provision of chronic care management services.   Allergies  Allergen Reactions  . Oxycodone Hcl     REACTION: hallucinations  . Albuterol Anxiety    Other reaction(s): Other (See Comments) Tremors   . Oxycodone Hcl Other (See Comments)    halucinations    Medications Reviewed Today    Reviewed by De Hollingshead, RPH-CPP (Pharmacist) on 06/16/20 at 1121  Med List Status: <None>  Medication Order Taking? Sig Documenting Provider Last Dose Status Informant  apixaban (ELIQUIS) 5 MG TABS tablet [id number] Yes Take 1 tablet (5 mg total) by mouth 2 (two) times daily. [name], MD Taking Active   atorvastatin (LIPITOR) 80 MG tablet [id number] Yes Take 1 tablet (80 mg total) by mouth daily. Deborra Medina  L, MD Taking Active   Fludrocortisone Acetate (FLORINEF PO) [id number] Yes Take 0.1 mg by mouth daily. [provider] Taking Active   FLUoxetine (PROZAC) 10 MG tablet [id number] Yes TAKE 1 TABLET BY MOUTH EVERY DAY [name], MD Taking Active   furosemide (LASIX) 20 MG tablet [id number] Yes Take 20 mg by mouth daily.  [provider] Taking Active Self  hydrocortisone (CORTEF) 10 MG tablet [id number] Yes Take 5 mg by  mouth 2 (two) times daily. 10 mg QAM, 5 PM [provider] Taking Active Self  Insulin Glargine (LANTUS SOLOSTAR) 100 UNIT/ML Solostar Pen [id number] Yes Inject 15 Units into the skin daily. ADJUST UPWARD AS NEEDED EVERY 3 DAYS  Patient taking differently: Inject 13 Units into the skin daily. ADJUST UPWARD AS NEEDED EVERY 3 DAYS   [name], MD Taking Active            Med Note ([place] Jun 16, 2020 11:21 AM) 17 units  JARDIANCE 10 MG TABS tablet [id number] Yes Take 10 mg by mouth every morning. [provider] Taking Active   levothyroxine (SYNTHROID) 100 MCG tablet [id number] Yes Take 100 mcg by mouth daily. [provider] Taking Active   metoprolol succinate (TOPROL-XL) 25 MG 24 hr tablet [id number] Yes Take 25 mg by mouth daily. [provider] Taking Active   mirtazapine (REMERON) 7.5 MG tablet [id number] Yes TAKE 1 TABLET BY MOUTH EVERYDAY AT BEDTIME [name], MD Taking Active   Semaglutide,0.25 or 0.5MG/DOS, (OZEMPIC, 0.25 OR 0.5 MG/DOSE,) 2 MG/1.5ML SOPN [id number] Yes Inject 0.5 mg into the skin once a week. [provider] Taking Active   traZODone (DESYREL) 150 MG tablet [id number] Yes TAKE 1 TABLET (150 MG TOTAL) BY MOUTH AT BEDTIME. [name], MD Taking Active           Patient Active Problem List   Diagnosis Date Noted  . Candida vaginitis 06/14/2020  . Chronic left SI joint pain 05/01/2019  . Transaminitis 05/01/2019  . Diarrhea in adult patient 03/29/2019  . Educated about COVID-19 virus infection 11/15/2018  . Anemia 07/06/2018  . Chronic atrial fibrillation ([place]) 05/25/2018  . Neuropathic pain of hand 05/03/2018  . History of cardiac arrest 05/03/2018  . Cerebral infarction, watershed distribution, bilateral, remote, resolved 05/03/2018  . Chronic systolic heart failure ([place]) 04/26/2018  . Dysphagia, oropharyngeal 04/26/2018  . Chronic constipation 04/26/2018  . Adrenal insufficiency  (Addison's disease) ([place]) 01/20/2018  . Hospital discharge follow-up 01/20/2018  . 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 CHB (complete heart block) ([place]) 08/17/2017  . Mobitz type 2 second degree AV block 08/09/2017  . Decreased GFR 05/18/2017  . Hyponatremia 11/01/2016  . Drug-induced vitamin B12 deficiency anemia 04/16/2016  . Type 2 diabetes mellitus with hyperglycemia, with long-term current use of insulin ([place]) 08/21/2015  . Overweight (BMI 25.0-29.9) 10/07/2014  . Depression with anxiety 08/08/2014  . Encounter for preventive health examination 09/13/2013  . Hyperlipidemia associated with type 2 diabetes mellitus ([place]) 07/25/2013  . Insomnia 07/25/2013  . Hypothyroidism 03/29/2012  . Screening for breast cancer 03/29/2012  . Screening for colon cancer 03/29/2012  . Depression, recurrent ([place]) 08/13/2008  . GERD 08/13/2008  . Osteopenia 08/13/2008  . COLONIC POLYPS, HX OF 08/13/2008    Medication Assistance: None required. Patient affirms current coverage meets needs.   Patient Care Plan: Medication Management    Problem Identified: Diabetes, Addison's Disease, Atrial Fibrillation, Osteoporosis  Long-Range Goal: Disease Progression Prevention   This Visit's Progress: On track  Priority: High  Note:   Current Barriers:  . Unable to independently monitor therapeutic efficacy . Unable to achieve control of diabetes  . Complex patient with multiple comorbidities, including diabetes, Addison's disease, atrial fibrillation, hx pulmonic valve replacement, osteoporosis  Pharmacist Clinical Goal(s):  Marland Kitchen Over the next 90 days, patient will verbalize ability to afford treatment regimen. . Over the next 90 days, patient will achieve adherence to monitoring guidelines and medication adherence to achieve therapeutic efficacy. . Over the next 90 days, patient will achieve control of diabetes as evidenced by    improvement in A1c through collaboration with  PharmD and provider.   Interventions: . Inter-disciplinary care team collaboration (see longitudinal plan of care) . Comprehensive medication review performed; medication list updated in electronic medical record  Diabetes: . Uncontrolled/controlled; current treatment: Ozempic 0.5 mg (prescribed 1 mg but patient actually taking 0.5 mg weekly), Lantus 17 units QPM, Jardiance 10 mg daily, Novolog 3 units TID before meals o Hx metformin, but discontinued by endocrinology at recent hospitalization d/t elevated lactic acid and risk of lactic acidosis . Current glucose readings: fasting glucose: 200s+ post prandial glucose: 200s+; interested in CGM . Denies hypoglycemic symptoms. Endorses significant polyuria and nocturia, polydipsia, polyphagia. . Denies cost concerns at this time . Counseled on goal A1c, goal fasting, and goal 2 hour post prandial glucose. Educated on benefit of continuous glucose monitoring. [facility], patient scheduled for RN visit for CGM teaching. May be able to use Android phone as reader, but script sent for reader as well. If patient can use phone and connect remotely to [facility], our f/u visit will be telephone; if not, our follow up will be face to face . Reviewed principals of insulin dosing, mechanisms of each agent.  . Increase Ozempic to 1 mg weekly. Script sent to pharmacy. D/c Lantus, start Tyler Aas due more prolonged duration of action and anticipated greater benefit. Start Tresiba 20 units daily. Increase Novolog to 5 units TID. Continue Jardiance 10 mg daily for now, as previous yeast infection has cleared. If determined to be insulin-dependent in the future, would d/c Jardiance d/t higher incidence of ketoacidosis w/ SGLT2 in T1DM . Discussed availability of patient assistance programs. Patient will investigate her household income and we may consider in the future. . Reviewed previous lab work. C peptide present, but low. Previously negative for GAD  and insulin autoantibodies. Could consider recheck of c-peptide, checking islet cell autoantibodies to determine endogenous insulin secretion and evaluate for autoimmune component, especially considering concurrent adrenal and thyroid disease. Will discuss w/ PCP.   Atrial Fibrillation, hx pulmonic valve replacement . Appropriately managed; current treatment: metoprolol succinate 25 mg daily, furosemide 20 mg daily.  . Anticoagulant regimen: Eliquis 5 mg BID . Recent UACR does not indicate indication for ACEi/ARB tx at this time . Recommended to continue current regimen at this time  Hyperlipidemia: . Uncontrolled, though anticipated to improve given recent dose increase; current treatment: atorvastatin 80 mg daily  . Recommended to continue current regimen at this time  Addison's Disease . Appropriately managed; current treatment; hydrocortisone 10 mg QAM, 5 mg QPM; fludrocortisone 0.1 mg daily . Continue current regimen at this time  Depression/anxiety/insomnia: . Appropriately managed; current treatment: fluoxetine 10 mg daily, trazodone 150 mg QPM, mirtazapine 7.5 mg daily  . Continue current regimen at this time. Continue to monitor for s/sx serotonin syndrome given high serotonergic burden. Monitor  weight gain, appetite w/ mirtazapine in light of uncontrolled diabetes.   Hypothyroidism:: . Appropriately managed; levothyroxine 100 mcg daily . Continue current regimen at this time  Osteoporosis: . Untreated; Will discuss treatment, including appropriate calcium + vitamin D and falls risk reduction, moving forward.   Patient Goals/Self-Care Activities . Over the next 30 days, patient will:  - take medications as prescribed check blood glucose TID using CGM, document, and provide at future appointments  Follow Up Plan: Telephone follow up appointment with care management team member scheduled for:~ 3 weeks       Plan: Telephone follow up appointment with care management team  member scheduled for: ~ 3 weeks  Catie Darnelle Maffucci, PharmD, [place], CPP Clinical Pharmacist [place] [phone]

## 2020-06-17 ENCOUNTER — Other Ambulatory Visit: Payer: Self-pay | Admitting: Internal Medicine

## 2020-06-17 DIAGNOSIS — E1165 Type 2 diabetes mellitus with hyperglycemia: Secondary | ICD-10-CM

## 2020-06-18 ENCOUNTER — Other Ambulatory Visit (INDEPENDENT_AMBULATORY_CARE_PROVIDER_SITE_OTHER): Payer: PPO

## 2020-06-18 ENCOUNTER — Other Ambulatory Visit: Payer: Self-pay

## 2020-06-18 ENCOUNTER — Ambulatory Visit (INDEPENDENT_AMBULATORY_CARE_PROVIDER_SITE_OTHER): Payer: PPO

## 2020-06-18 DIAGNOSIS — E1165 Type 2 diabetes mellitus with hyperglycemia: Secondary | ICD-10-CM | POA: Diagnosis not present

## 2020-06-18 DIAGNOSIS — Z794 Long term (current) use of insulin: Secondary | ICD-10-CM

## 2020-06-18 NOTE — Progress Notes (Signed)
Patient presented for [facility] placement and instruction. Per Providers order from 06-16-20. Patient voiced no concerns nor showed signs of distress during placement. Also instructed patient on how to record and give access to [place] view so we can have accurate monitoring system. Instructed patient that if further questions, they can call clinic to be given further direction.

## 2020-06-19 LAB — C-PEPTIDE: C-Peptide: 1.21 ng/mL (ref 0.80–3.85)

## 2020-06-29 DIAGNOSIS — I442 Atrioventricular block, complete: Secondary | ICD-10-CM | POA: Diagnosis not present

## 2020-06-29 DIAGNOSIS — Z45018 Encounter for adjustment and management of other part of cardiac pacemaker: Secondary | ICD-10-CM | POA: Diagnosis not present

## 2020-06-29 DIAGNOSIS — I469 Cardiac arrest, cause unspecified: Secondary | ICD-10-CM | POA: Diagnosis not present

## 2020-06-29 DIAGNOSIS — Z950 Presence of cardiac pacemaker: Secondary | ICD-10-CM | POA: Diagnosis not present

## 2020-07-09 ENCOUNTER — Ambulatory Visit: Payer: PPO | Admitting: Pharmacist

## 2020-07-09 DIAGNOSIS — Z794 Long term (current) use of insulin: Secondary | ICD-10-CM

## 2020-07-09 DIAGNOSIS — E271 Primary adrenocortical insufficiency: Secondary | ICD-10-CM

## 2020-07-09 DIAGNOSIS — E1169 Type 2 diabetes mellitus with other specified complication: Secondary | ICD-10-CM

## 2020-07-09 MED ORDER — NOVOLOG FLEXPEN 100 UNIT/ML ~~LOC~~ SOPN: 6.0000 [IU] | PEN_INJECTOR | Freq: Three times a day (TID) | SUBCUTANEOUS | 11 refills | Status: DC

## 2020-07-09 MED ORDER — TRESIBA FLEXTOUCH 200 UNIT/ML ~~LOC~~ SOPN: PEN_INJECTOR | SUBCUTANEOUS | 2 refills | Status: DC

## 2020-07-09 NOTE — Patient Instructions (Signed)
Haley Young,   It was great talking with you today!  Click up to the "0.25 mg" marking on the Ozempic pen. Take 0.25 mg once weekly.   Increase Tresiba to 24 units daily. Increase Novolog to 6 units before a meal (skip if you skip the meal).   Make sure and scan AT LEAST every 8 hours. I've enclosed your glucose download to show you what I see if you only scan ~ 1 time daily.   Take care, and call me if you need something!  Catie Feliz Beam, PharmD [phone]   Visit Information  Patient Care Plan: Medication Management    Problem Identified: Diabetes, Addison's Disease, Atrial Fibrillation, Osteoporosis     Long-Range Goal: Disease Progression Prevention   This Visit's Progress: On track  Recent Progress: On track  Priority: High  Note:   Current Barriers:  . Unable to independently monitor therapeutic efficacy . Unable to achieve control of diabetes  . Complex patient with multiple comorbidities, including diabetes, Addison's disease, atrial fibrillation, hx pulmonic valve replacement, osteoporosis  Pharmacist Clinical Goal(s):  Marland Kitchen Over the next 90 days, patient will verbalize ability to afford treatment regimen. . Over the next 90 days, patient will achieve adherence to monitoring guidelines and medication adherence to achieve therapeutic efficacy. . Over the next 90 days, patient will achieve control of diabetes as evidenced by    improvement in A1c through collaboration with PharmD and provider.   Interventions: . Inter-disciplinary care team collaboration (see longitudinal plan of care) . Comprehensive medication review performed; medication list updated in electronic medical record  Diabetes: . Uncontrolled; current treatment: Ozempic 1 mg prescribed - realized today over the phone that patient has been taking 10 clicks. This is a dose <0.25 mg, Lantus 20 units QPM, Jardiance 10 mg daily, holding Novolog as previously recommended o Hx metformin, but discontinued by  endocrinology at recent hospitalization d/t elevated lactic acid and risk of lactic acidosis . C-peptide WNL, indicating endogenous insulin production and likely benefit from GLP1 . Current glucose readings:  Date of Download: 12/17-12/30/12 % Time CGM is active: 38% Average Glucose: 276 mg/dL Glucose Management Indicator: unable to calculate  Glucose Variability: 26.2 (goal <36%) Time in Goal:  - Time in range 70-180: 10% - Time above range: 90% - Time below range: 0% Observed patterns: Elevated throughout the day. Patient typically only scanning fastings, so can only see the 8 hours prior where sugars are >300 then are in high 100s when patient wakes up  . Denies hypoglycemic symptoms. Endorses significant polyuria and nocturia, polydipsia, polyphagia. Delphina Cahill through appropriate Ozempic dosing use. Patient has not been responding to Ozempic because she has been administering a subtherapeutic dose. Start at 0.25 mg weekly for 4 weeks, then increase to 0.5 mg weekly. Increase Lantus to 24 units daily. Increase Novolog to 6 units TID with meals. Hypoglycemic treatment counseling provided.    Atrial Fibrillation, hx pulmonic valve replacement . Appropriately managed; current treatment: metoprolol succinate 25 mg daily, furosemide 20 mg daily.  . Anticoagulant regimen: Eliquis 5 mg BID . Recent UACR does not indicate indication for ACEi/ARB tx at this time . Recommended to continue current regimen at this time  Hyperlipidemia: . Uncontrolled, though anticipated to improve given recent dose increase; current treatment: atorvastatin 80 mg daily  . Recommended to continue current regimen at this time  Addison's Disease . Appropriately managed; current treatment; hydrocortisone 10 mg QAM, 5 mg QPM; fludrocortisone 0.1 mg daily . Recommend to continue current regimen  at this time  Depression/anxiety/insomnia: . Appropriately managed; current treatment: fluoxetine 10 mg daily, trazodone 150  mg QPM, mirtazapine 7.5 mg daily  . Recommend to continue current regimen at this time. Continue to monitor for s/sx serotonin syndrome given high serotonergic burden. Monitor weight gain, appetite w/ mirtazapine in light of uncontrolled diabetes.   Hypothyroidism: . Appropriately managed; levothyroxine 100 mcg daily . Continue current regimen at this time  Osteoporosis: . Untreated; Will discuss treatment, including appropriate calcium + vitamin D and falls risk reduction, moving forward.   Patient Goals/Self-Care Activities . Over the next 30 days, patient will:  - take medications as prescribed check blood glucose TID using CGM, document, and provide at future appointments  Follow Up Plan: Telephone follow up appointment with care management team member scheduled for:~ 2 weeks      The patient verbalized understanding of instructions, educational materials, and care plan provided today and agreed to receive a mailed copy of patient instructions, educational materials, and care plan.   Plan: Telephone follow up appointment with care management team member scheduled for:~ 2 weeks  Catie Feliz Beam, PharmD, [place], CPP Clinical Pharmacist [employer] at [employer] [phone]

## 2020-07-09 NOTE — Chronic Care Management (AMB) (Signed)
Chronic Care Management   Pharmacy Note  07/09/2020 Name: Haley Young MRN: [id number] DOB: 1951/06/21  Subjective:  Haley Young is a 69 y.o. year old female who is a primary care patient of Tullo, Mar Daring, MD. The CCM team was consulted for assistance with chronic disease management and care coordination needs.     Engaged with patient by telephone for follow up visit in response to provider referral for pharmacy case management and/or care coordination services.   Consent to Services:  Ms. Haley Young was given information about Chronic Care Management services, agreed to services, and gave verbal consent prior to initiation of services on 06/16/20. Please see initial visit note for detailed documentation.   Objective:  Lab Results  Component Value Date   CREATININE 1.16 (H) 06/12/2020   CREATININE 1.17 (H) 03/20/2020   CREATININE 0.87 05/02/2019    Lab Results  Component Value Date   HGBA1C 13.2 (H) 06/12/2020       Component Value Date/Time   CHOL 182 06/12/2020 1625   TRIG 114 06/12/2020 1625   HDL 48 (L) 06/12/2020 1625   CHOLHDL 3.8 06/12/2020 1625   VLDL 18.0 05/02/2019 1102   LDLCALC 112 (H) 06/12/2020 1625   LDLDIRECT 165.0 04/14/2016 1012    BP Readings from Last 3 Encounters:  06/12/20 102/64  03/20/20 (!) 143/65  03/20/20 (!) 82/58    Assessment/Interventions: Review of patient past medical history, allergies, medications, health status, including review of consultants reports, laboratory and other test data, was performed as part of comprehensive evaluation and provision of chronic care management services.   SDOH (Social Determinants of Health) assessments and interventions performed:    CCM Care Plan  Allergies  Allergen Reactions  . Oxycodone Hcl     REACTION: hallucinations  . Albuterol Anxiety    Other reaction(s): Other (See Comments) Tremors   . Oxycodone Hcl Other (See Comments)    halucinations    Medications Reviewed Today     Reviewed by Lourena Simmonds, RPH-CPP (Pharmacist) on 07/09/20 at 1304  Med List Status: <None>  Medication Order Taking? Sig Documenting Provider Last Dose Status Informant  apixaban (ELIQUIS) 5 MG TABS tablet [id number] Yes Take 1 tablet (5 mg total) by mouth 2 (two) times daily. Sherlene Shams, MD Taking Active   atorvastatin (LIPITOR) 80 MG tablet [id number] Yes Take 1 tablet (80 mg total) by mouth daily. Sherlene Shams, MD Taking Active   Continuous Blood Gluc Receiver (FREESTYLE LIBRE 2 READER) [place] [id number] Yes Use to check glucose at least TID Sherlene Shams, MD Taking Active   Continuous Blood Gluc Sensor (FREESTYLE LIBRE 2 SENSOR) [place] [id number] Yes Use to check glucose at least TID Sherlene Shams, MD Taking Active   Fludrocortisone Acetate (FLORINEF PO) [id number] Yes Take 0.1 mg by mouth daily. [provider] Taking Active   FLUoxetine (PROZAC) 10 MG tablet [id number] Yes TAKE 1 TABLET BY MOUTH EVERY DAY Sherlene Shams, MD Taking Active   furosemide (LASIX) 20 MG tablet [id number] Yes Take 20 mg by mouth daily.  [provider] Taking Active Self  hydrocortisone (CORTEF) 10 MG tablet [id number] Yes Take 5 mg by mouth 2 (two) times daily. 10 mg QAM, 5 PM [provider] Taking Active Self  insulin degludec (TRESIBA FLEXTOUCH) 200 UNIT/ML FlexTouch Pen [id number] Yes Inject 20 units daily. Titrate as instructed. Max daily dose: 40 units Sherlene Shams, MD Taking Active   JARDIANCE 10 MG TABS tablet [id number] Yes Take  10 mg by mouth every morning. [provider] Taking Active   levothyroxine (SYNTHROID) 100 MCG tablet [id number] Yes Take 100 mcg by mouth daily. [provider] Taking Active   metoprolol succinate (TOPROL-XL) 25 MG 24 hr tablet [id number] Yes Take 25 mg by mouth daily. [provider] Taking Active   mirtazapine (REMERON) 7.5 MG tablet [id number] Yes TAKE 1 TABLET BY MOUTH EVERYDAY AT BEDTIME Haley Mc, MD Taking  Active   Probiotic Product (PROBIOTIC ADVANCED PO) [id number] Yes Take 1 capsule by mouth daily. [provider] Taking Active   Semaglutide, 1 MG/DOSE, (OZEMPIC, 1 MG/DOSE,) 4 MG/3ML SOPN [id number] Yes Inject 1 mg into the skin once a week. Haley Mc, MD Taking Active   traZODone (DESYREL) 150 MG tablet [id number] Yes TAKE 1 TABLET (150 MG TOTAL) BY MOUTH AT BEDTIME. Haley Mc, MD Taking Active           Patient Active Problem List   Diagnosis Date Noted  . Candida vaginitis 06/14/2020  . Chronic left SI joint pain 05/01/2019  . Transaminitis 05/01/2019  . Diarrhea in adult patient 03/29/2019  . Educated about COVID-19 virus infection 11/15/2018  . Anemia 07/06/2018  . Chronic atrial fibrillation ([place]) 05/25/2018  . Neuropathic pain of hand 05/03/2018  . History of cardiac arrest 05/03/2018  . Cerebral infarction, watershed distribution, bilateral, remote, resolved 05/03/2018  . Chronic systolic heart failure ([place]) 04/26/2018  . Dysphagia, oropharyngeal 04/26/2018  . Chronic constipation 04/26/2018  . Adrenal insufficiency (Addison's disease) ([place]) 01/20/2018  . Hospital discharge follow-up 01/20/2018  . 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 CHB (complete heart block) ([place]) 08/17/2017  . Mobitz type 2 second degree AV block 08/09/2017  . Decreased GFR 05/18/2017  . Hyponatremia 11/01/2016  . Drug-induced vitamin B12 deficiency anemia 04/16/2016  . Type 2 diabetes mellitus with hyperglycemia, with long-term current use of insulin ([place]) 08/21/2015  . Overweight (BMI 25.0-29.9) 10/07/2014  . Depression with anxiety 08/08/2014  . Encounter for preventive health examination 09/13/2013  . Hyperlipidemia associated with type 2 diabetes mellitus ([place]) 07/25/2013  . Insomnia 07/25/2013  . Hypothyroidism 03/29/2012  . Screening for breast cancer 03/29/2012  . Screening for colon cancer 03/29/2012  . Depression, recurrent ([place]) 08/13/2008  .  GERD 08/13/2008  . Osteopenia 08/13/2008  . COLONIC POLYPS, HX OF 08/13/2008    Conditions to be addressed/monitored per PCP order: HTN, HLD, DMII and Addison's  Patient Care Plan: Medication Management    Problem Identified: Diabetes, Addison's Disease, Atrial Fibrillation, Osteoporosis     Long-Range Goal: Disease Progression Prevention   This Visit's Progress: On track  Recent Progress: On track  Priority: High  Note:   Current Barriers:  . Unable to independently monitor therapeutic efficacy . Unable to achieve control of diabetes  . Complex patient with multiple comorbidities, including diabetes, Addison's disease, atrial fibrillation, hx pulmonic valve replacement, osteoporosis  Pharmacist Clinical Goal(s):  Marland Kitchen Over the next 90 days, patient will verbalize ability to afford treatment regimen. . Over the next 90 days, patient will achieve adherence to monitoring guidelines and medication adherence to achieve therapeutic efficacy. . Over the next 90 days, patient will achieve control of diabetes as evidenced by    improvement in A1c through collaboration with PharmD and provider.   Interventions: . Inter-disciplinary care team collaboration (see longitudinal plan of care) . Comprehensive medication review performed; medication list updated in electronic medical record  Diabetes: . Uncontrolled; current treatment: Ozempic 1 mg prescribed -  realized today over the phone that patient has been taking 10 clicks. This is a dose <0.25 mg, Lantus 20 units QPM, Jardiance 10 mg daily, holding Novolog as previously recommended o Hx metformin, but discontinued by endocrinology at recent hospitalization d/t elevated lactic acid and risk of lactic acidosis . C-peptide WNL, indicating endogenous insulin production and likely benefit from GLP1 . Current glucose readings:  Date of Download: 12/17-12/30/12 % Time CGM is active: 38% Average Glucose: 276 mg/dL Glucose Management Indicator:  unable to calculate  Glucose Variability: 26.2 (goal <36%) Time in Goal:  - Time in range 70-180: 10% - Time above range: 90% - Time below range: 0% Observed patterns: Elevated throughout the day. Patient typically only scanning fastings, so can only see the 8 hours prior where sugars are >300 then are in high 100s when patient wakes up  . Denies hypoglycemic symptoms. Endorses significant polyuria and nocturia, polydipsia, polyphagia. Jama Flavors through appropriate Ozempic dosing use. Patient has not been responding to Ozempic because she has been administering a subtherapeutic dose. Start at 0.25 mg weekly for 4 weeks, then increase to 0.5 mg weekly. Increase Lantus to 24 units daily. Increase Novolog to 6 units TID with meals. Hypoglycemic treatment counseling provided.    Atrial Fibrillation, hx pulmonic valve replacement . Appropriately managed; current treatment: metoprolol succinate 25 mg daily, furosemide 20 mg daily.  . Anticoagulant regimen: Eliquis 5 mg BID . Recent UACR does not indicate indication for ACEi/ARB tx at this time . Recommended to continue current regimen at this time  Hyperlipidemia: . Uncontrolled, though anticipated to improve given recent dose increase; current treatment: atorvastatin 80 mg daily  . Recommended to continue current regimen at this time  Addison's Disease . Appropriately managed; current treatment; hydrocortisone 10 mg QAM, 5 mg QPM; fludrocortisone 0.1 mg daily . Recommend to continue current regimen at this time  Depression/anxiety/insomnia: . Appropriately managed; current treatment: fluoxetine 10 mg daily, trazodone 150 mg QPM, mirtazapine 7.5 mg daily  . Recommend to continue current regimen at this time. Continue to monitor for s/sx serotonin syndrome given high serotonergic burden. Monitor weight gain, appetite w/ mirtazapine in light of uncontrolled diabetes.   Hypothyroidism: . Appropriately managed; levothyroxine 100 mcg  daily . Continue current regimen at this time  Osteoporosis: . Untreated; Will discuss treatment, including appropriate calcium + vitamin D and falls risk reduction, moving forward.   Patient Goals/Self-Care Activities . Over the next 30 days, patient will:  - take medications as prescribed check blood glucose TID using CGM, document, and provide at future appointments  Follow Up Plan: Telephone follow up appointment with care management team member scheduled for:~ 2 weeks      Medication Assistance: None required. Patient affirms current coverage meets needs.   Plan: Telephone follow up appointment with care management team member scheduled for:~ 2 weeks  Catie Darnelle Maffucci, PharmD, [place], [place] Clinical Pharmacist [employer] [facility] [employer] [phone]

## 2020-07-15 ENCOUNTER — Ambulatory Visit (INDEPENDENT_AMBULATORY_CARE_PROVIDER_SITE_OTHER): Payer: HMO

## 2020-07-15 VITALS — Ht 61.0 in | Wt 136.0 lb

## 2020-07-15 DIAGNOSIS — Z0000 Encounter for general adult medical examination without abnormal findings: Secondary | ICD-10-CM

## 2020-07-15 NOTE — Patient Instructions (Addendum)
Haley Young , Thank you for taking time to come for your Medicare Wellness Visit. I appreciate your ongoing commitment to your health goals. Please review the following plan we discussed and let me know if I can assist you in the future.   These are the goals we discussed: Goals      Patient Stated   .  A1c lower (pt-stated)      Followed by Endocrinologist. [facility].  Last A1c 9.5, Glucose 258    .  Medication Management (pt-stated)      Patient Goals/Self-Care Activities . Over the next 30 days, patient will:  - take medications as prescribed check blood glucose TID using CGM, document, and provide at future appointments       This is a list of the screening recommended for you and due dates:  Health Maintenance  Topic Date Due  . Tetanus Vaccine  Never done  . Complete foot exam   09/14/2014  . Pneumonia vaccines (2 of 2 - PPSV23) 07/29/2016  . Eye exam for diabetics  06/25/2020  . Hemoglobin A1C  12/11/2020  . Urine Protein Check  06/12/2021  . Mammogram  06/19/2021  . Colon Cancer Screening  09/14/2022  . Flu Shot  Completed  . DEXA scan (bone density measurement)  Completed  . COVID-19 Vaccine  Completed  .  Hepatitis C: One time screening is recommended by Center for Disease Control  (CDC) for  adults born from [birth year] through 1967.   Completed    Immunizations Immunization History  Administered Date(s) Administered  . Fluad Quad(high Dose 65+) 06/12/2020  . H1N1 08/13/2008  . Influenza Split 07/28/2011, 06/28/2012  . Influenza, High Dose Seasonal PF 04/14/2016, 07/10/2017, 04/11/2018, 05/07/2019  . Influenza,inj,Quad PF,6+ Mos 07/25/2013, 08/21/2015  . Influenza-Unspecified 07/22/2015  . Moderna Sars-Covid-2 Vaccination 08/15/2019, 09/12/2019  . Pneumococcal Conjugate-13 09/13/2013  . Pneumococcal Polysaccharide-23 07/30/2011  . Zoster 09/13/2013   Keep all routine maintenance appointments.   Follow up CCM 07/21/20 @ 9:00  Advanced directives: not yet  completed  Conditions/risks identified: none new  Follow up in one year for your annual wellness visit.   Preventive Care 47 Years and Older, Female Preventive care refers to lifestyle choices and visits with your health care provider that can promote health and wellness. What does preventive care include?  A yearly physical exam. This is also called an annual well check.  Dental exams once or twice a year.  Routine eye exams. Ask your health care provider how often you should have your eyes checked.  Personal lifestyle choices, including:  Daily care of your teeth and gums.  Regular physical activity.  Eating a healthy diet.  Avoiding tobacco and drug use.  Limiting alcohol use.  Practicing safe sex.  Taking low-dose aspirin every day.  Taking vitamin and mineral supplements as recommended by your health care provider. What happens during an annual well check? The services and screenings done by your health care provider during your annual well check will depend on your age, overall health, lifestyle risk factors, and family history of disease. Counseling  Your health care provider may ask you questions about your:  Alcohol use.  Tobacco use.  Drug use.  Emotional well-being.  Home and relationship well-being.  Sexual activity.  Eating habits.  History of falls.  Memory and ability to understand (cognition).  Work and work Astronomer.  Reproductive health. Screening  You may have the following tests or measurements:  Height, weight, and BMI.  Blood pressure.  Lipid and cholesterol levels. These may be checked every 5 years, or more frequently if you are over 48 years old.  Skin check.  Lung cancer screening. You may have this screening every year starting at age 100 if you have a 30-pack-year history of smoking and currently smoke or have quit within the past 15 years.  Fecal occult blood test (FOBT) of the stool. You may have this test every  year starting at age 59.  Flexible sigmoidoscopy or colonoscopy. You may have a sigmoidoscopy every 5 years or a colonoscopy every 10 years starting at age 41.  Hepatitis C blood test.  Hepatitis B blood test.  Sexually transmitted disease (STD) testing.  Diabetes screening. This is done by checking your blood sugar (glucose) after you have not eaten for a while (fasting). You may have this done every 1-3 years.  Bone density scan. This is done to screen for osteoporosis. You may have this done starting at age 47.  Mammogram. This may be done every 1-2 years. Talk to your health care provider about how often you should have regular mammograms. Talk with your health care provider about your test results, treatment options, and if necessary, the need for more tests. Vaccines  Your health care provider may recommend certain vaccines, such as:  Influenza vaccine. This is recommended every year.  Tetanus, diphtheria, and acellular pertussis (Tdap, Td) vaccine. You may need a Td booster every 10 years.  Zoster vaccine. You may need this after age 24.  Pneumococcal 13-valent conjugate (PCV13) vaccine. One dose is recommended after age 88.  Pneumococcal polysaccharide (PPSV23) vaccine. One dose is recommended after age 81. Talk to your health care provider about which screenings and vaccines you need and how often you need them. This information is not intended to replace advice given to you by your health care provider. Make sure you discuss any questions you have with your health care provider. Document Released: 07/24/2015 Document Revised: 03/16/2016 Document Reviewed: 04/28/2015 Elsevier Interactive Patient Education  2017 [employer].  Fall Prevention in the Home Falls can cause injuries. They can happen to people of all ages. There are many things you can do to make your home safe and to help prevent falls. What can I do on the outside of my home?  Regularly fix the edges of  walkways and driveways and fix any cracks.  Remove anything that might make you trip as you walk through a door, such as a raised step or threshold.  Trim any bushes or trees on the path to your home.  Use bright outdoor lighting.  Clear any walking paths of anything that might make someone trip, such as rocks or tools.  Regularly check to see if handrails are loose or broken. Make sure that both sides of any steps have handrails.  Any raised decks and porches should have guardrails on the edges.  Have any leaves, snow, or ice cleared regularly.  Use sand or salt on walking paths during winter.  Clean up any spills in your garage right away. This includes oil or grease spills. What can I do in the bathroom?  Use night lights.  Install grab bars by the toilet and in the tub and shower. Do not use towel bars as grab bars.  Use non-skid mats or decals in the tub or shower.  If you need to sit down in the shower, use a plastic, non-slip stool.  Keep the floor dry. Clean up any water that spills on  the floor as soon as it happens.  Remove soap buildup in the tub or shower regularly.  Attach bath mats securely with double-sided non-slip rug tape.  Do not have throw rugs and other things on the floor that can make you trip. What can I do in the bedroom?  Use night lights.  Make sure that you have a light by your bed that is easy to reach.  Do not use any sheets or blankets that are too big for your bed. They should not hang down onto the floor.  Have a firm chair that has side arms. You can use this for support while you get dressed.  Do not have throw rugs and other things on the floor that can make you trip. What can I do in the kitchen?  Clean up any spills right away.  Avoid walking on wet floors.  Keep items that you use a lot in easy-to-reach places.  If you need to reach something above you, use a strong step stool that has a grab bar.  Keep electrical cords  out of the way.  Do not use floor polish or wax that makes floors slippery. If you must use wax, use non-skid floor wax.  Do not have throw rugs and other things on the floor that can make you trip. What can I do with my stairs?  Do not leave any items on the stairs.  Make sure that there are handrails on both sides of the stairs and use them. Fix handrails that are broken or loose. Make sure that handrails are as long as the stairways.  Check any carpeting to make sure that it is firmly attached to the stairs. Fix any carpet that is loose or worn.  Avoid having throw rugs at the top or bottom of the stairs. If you do have throw rugs, attach them to the floor with carpet tape.  Make sure that you have a light switch at the top of the stairs and the bottom of the stairs. If you do not have them, ask someone to add them for you. What else can I do to help prevent falls?  Wear shoes that:  Do not have high heels.  Have rubber bottoms.  Are comfortable and fit you well.  Are closed at the toe. Do not wear sandals.  If you use a stepladder:  Make sure that it is fully opened. Do not climb a closed stepladder.  Make sure that both sides of the stepladder are locked into place.  Ask someone to hold it for you, if possible.  Clearly mark and make sure that you can see:  Any grab bars or handrails.  First and last steps.  Where the edge of each step is.  Use tools that help you move around (mobility aids) if they are needed. These include:  Canes.  Walkers.  Scooters.  Crutches.  Turn on the lights when you go into a dark area. Replace any light bulbs as soon as they burn out.  Set up your furniture so you have a clear path. Avoid moving your furniture around.  If any of your floors are uneven, fix them.  If there are any pets around you, be aware of where they are.  Review your medicines with your doctor. Some medicines can make you feel dizzy. This can increase  your chance of falling. Ask your doctor what other things that you can do to help prevent falls. This information is not intended to  replace advice given to you by your health care provider. Make sure you discuss any questions you have with your health care provider. Document Released: 04/23/2009 Document Revised: 12/03/2015 Document Reviewed: 08/01/2014 Elsevier Interactive Patient Education  2017 [employer].

## 2020-07-15 NOTE — Progress Notes (Addendum)
Subjective:   Haley Young is a 70 y.o. female who presents for Medicare Annual (Subsequent) preventive examination.  Review of Systems    No ROS.  Medicare Wellness Virtual Visit.  Cardiac Risk Factors include: advanced age (>41men, >34 women);diabetes mellitus     Objective:    Today's Vitals   07/15/20 1038  Weight: 136 lb (61.7 kg)  Height: 5\' 1"  (1.549 m)   Body mass index is 25.7 kg/m.  Advanced Directives 07/15/2020 03/20/2020 03/20/2020 07/15/2019 02/07/2019 12/10/2018 07/12/2018  Does Patient Have a Medical Advance Directive? No No No No No No No  Type of Advance Directive - - - - - - -  Does patient want to make changes to medical advance directive? - - - - - - -  Would patient like information on creating a medical advance directive? No - Patient declined No - Patient declined - No - Patient declined Yes (MAU/Ambulatory/Procedural Areas - Information given) No - Patient declined No - Patient declined    Current Medications (verified) Outpatient Encounter Medications as of 07/15/2020  Medication Sig   apixaban (ELIQUIS) 5 MG TABS tablet Take 1 tablet (5 mg total) by mouth 2 (two) times daily.   atorvastatin (LIPITOR) 80 MG tablet Take 1 tablet (80 mg total) by mouth daily.   Continuous Blood Gluc Receiver (FREESTYLE LIBRE 2 READER) DEVI Use to check glucose at least TID   Continuous Blood Gluc Sensor (FREESTYLE LIBRE 2 SENSOR) MISC Use to check glucose at least TID   Fludrocortisone Acetate (FLORINEF PO) Take 0.1 mg by mouth daily.   FLUoxetine (PROZAC) 10 MG tablet TAKE 1 TABLET BY MOUTH EVERY DAY   furosemide (LASIX) 20 MG tablet Take 20 mg by mouth daily.    hydrocortisone (CORTEF) 10 MG tablet Take 5 mg by mouth 2 (two) times daily. 10 mg QAM, 5 PM   insulin aspart (NOVOLOG FLEXPEN) 100 UNIT/ML FlexPen Inject 6 Units into the skin 3 (three) times daily with meals.   insulin degludec (TRESIBA FLEXTOUCH) 200 UNIT/ML FlexTouch Pen Inject 24 units daily. Titrate as instructed.  Max daily dose: 40 units   JARDIANCE 10 MG TABS tablet Take 10 mg by mouth every morning.   levothyroxine (SYNTHROID) 100 MCG tablet Take 100 mcg by mouth daily.   metoprolol succinate (TOPROL-XL) 25 MG 24 hr tablet Take 25 mg by mouth daily.   mirtazapine (REMERON) 7.5 MG tablet TAKE 1 TABLET BY MOUTH EVERYDAY AT BEDTIME   Probiotic Product (PROBIOTIC ADVANCED PO) Take 1 capsule by mouth daily.   Semaglutide,0.25 or 0.5MG /DOS, (OZEMPIC, 0.25 OR 0.5 MG/DOSE,) 2 MG/1.5ML SOPN Inject into the skin. Has been taking 10 clicks ([id number] mg)   traZODone (DESYREL) 150 MG tablet TAKE 1 TABLET (150 MG TOTAL) BY MOUTH AT BEDTIME.   No facility-administered encounter medications on file as of 07/15/2020.    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 Evelina Dun   PARTIAL HYSTERECTOMY  1970   PULMONARY VEIN STENOSIS REPAIR     VALVE REPLACEMENT     Family History  Problem Relation Age of Onset   Coarctation of the aorta Sister    Diabetes Mother    COPD Mother    Obesity Mother    Depression Mother    Pancreatic cancer Mother    Alcohol abuse Father    Cirrhosis Father    Down syndrome Brother    Pneumonia Brother    Down syndrome Daughter    Lupus Daughter    Ulcerative colitis Daughter     Diabetes Maternal Aunt    Breast cancer Maternal Aunt    Breast cancer Maternal Aunt    Social History   Socioeconomic History   Marital status: Married    Spouse name: Not on file   Number of children: 2   Years of education: Not on file   Highest education level: Not on file  Occupational History   Occupation: Retired    Fish farm manager: RETIRED   Occupation: Press photographer   Tobacco Use   Smoking status: Never Smoker   Smokeless tobacco: Never Used  Scientific laboratory technician Use: Never used  Substance and Sexual Activity   Alcohol use: Yes    Comment: occasional wine    Drug use: No   Sexual activity: Yes  Other Topics Concern   Not on file  Social History Narrative   Divorced x 2      Sister is pt here- Surveyor, minerals      Cares for 29 year old mother      13 for daughter with Down's syndrome      Adopted son with Asberger's syndrome      G1P1      Cares for 2 disabled children    Social Determinants of Health   Financial Resource Strain: Medium Risk   Difficulty of Paying Living Expenses: Somewhat hard  Food Insecurity: No Food Insecurity   Worried About Charity fundraiser in the Last Year: Never true   [place] in the Last Year: Never true  Transportation Needs: No Transportation Needs   Lack of Transportation (Medical): No   Lack of Transportation (Non-Medical): No  Physical Activity: Unknown   Days of Exercise per Week: 0 days   Minutes of Exercise per Session: Not on file  Stress: No Stress Concern Present   Feeling of Stress : Only a little  Social Connections: Moderately Integrated   Frequency of Communication with Friends and Family: More than three times a week   Frequency of Social Gatherings with Friends and Family: Never   Attends Religious Services: Never   Marine scientist or Organizations: Yes   Attends Archivist Meetings: Never   Marital Status: Married    Tobacco Counseling Counseling given: Not Answered   Clinical  Intake:  Pre-visit preparation completed: Yes        Diabetes: Yes (Followed by pcp)  How often do you need to have someone help you when you read instructions, pamphlets, or other written materials from your doctor or pharmacy?: 1 - Never  Nutrition Risk Assessment: Has the patient had any N/V/D ?  No  Does the patient have any non-healing wounds?  No  Has the patient had any unintentional weight loss or weight gain?  No   Diabetes: If diabetic, was a CBG obtained today?  No  Did the patient bring in their glucometer from home?  No   Financial Strains and Diabetes Management: Is the patient seen by Chronic Care Management for management of their diabetes?  Yes   Diabetic Exams: Diabetic Eye Exam: Completed 09/02/19 Diabetic Foot Exam: Followed by pcp. Notes no changes, wounds, numbness tingling.   Interpreter Needed?: No      Activities of Daily Living In your present state of health, do you have any difficulty performing the following activities: 07/15/2020  Hearing? N  Vision? N  Difficulty concentrating or making decisions? N  Walking or climbing stairs? N  Dressing or bathing? N  Doing errands, shopping? N  Preparing Food and eating ? N  Using the Toilet? N  In the past six months, have you accidently leaked urine? N  Do you have problems with loss of bowel control? N  Managing your Medications? N  Managing your Finances? N  Housekeeping or managing your Housekeeping? N  Some recent data might be hidden    Patient Care Team: [name], MD as PCP - General (Internal Medicine) De Hollingshead, RPH-CPP (Pharmacist)  Indicate any recent Medical Services you may have received from other than [facility] providers in the past year (date may be approximate).     Assessment:   This is a routine wellness examination for [facility].  I connected with Mannat today by telephone and verified that I am speaking with the correct person using two identifiers. Location  patient: home Location provider: work Persons participating in the virtual visit: patient, Marine scientist.    I discussed the limitations, risks, security and privacy concerns of performing an evaluation and management service by telephone and the availability of in person appointments. The patient expressed understanding and verbally consented to this telephonic visit.    Interactive audio and video telecommunications were attempted between this provider and patient, however failed, due to patient having technical difficulties OR patient did not have access to video capability.  We continued and completed visit with audio only.  Some vital signs may be absent or patient reported.   Hearing/Vision screen  Hearing Screening   125Hz  250Hz  500Hz  1000Hz  2000Hz  3000Hz  4000Hz  6000Hz  8000Hz   Right ear:           Left ear:           Comments: Patient is able to hear conversational tones without difficulty.  No issues reported.  Vision Screening Comments: Wears corrective lenses Visual acuity not assessed, virtual visit.       Dietary issues and exercise activities discussed: Current Exercise Habits: The patient does not participate in regular exercise at present  Regular diet Good water intake   Depression Screen PHQ 2/9 Scores 07/15/2020 07/15/2019 03/29/2019 12/10/2018 07/12/2018 07/10/2017 07/08/2016  PHQ - 2 Score 0 1 1 1 1  0 0  PHQ- 9 Score - - - - - - -    Fall Risk Fall Risk  07/15/2020 06/12/2020 07/15/2019 05/01/2019 03/29/2019  Falls in the past year? 0 0 0 0 0  Comment - - - - -  Number falls in past yr: 0 - - - 0  Injury with Fall? 0 - - - 0  Risk for fall due  to : - - - - History of fall(s)  Follow up Falls evaluation completed Falls evaluation completed Falls prevention discussed Falls evaluation completed -    FALL RISK PREVENTION PERTAINING TO THE HOME: Handrails in use when climbing stairs? Yes Home free of loose throw rugs in walkways, pet beds, electrical cords, etc? Yes  Adequate  lighting in your home to reduce risk of falls? Yes   ASSISTIVE DEVICES UTILIZED TO PREVENT FALLS: Use of a cane, walker or w/c? No   TIMED UP AND GO: Was the test performed? No . Virtual visit.   Cognitive Function: MMSE - Mini Mental State Exam 07/10/2017 07/08/2016  Orientation to time 5 5  Orientation to Place 5 5  Registration 3 3  Attention/ Calculation 5 5  Recall 3 3  Language- name 2 objects 2 2  Language- repeat 1 1  Language- follow 3 step command 3 3  Language- read & follow direction 1 1  Write a sentence 1 1  Copy design 1 1  Total score 30 30     6CIT Screen 07/15/2020 07/15/2019 07/12/2018  What Year? 0 points 0 points 0 points  What month? 0 points 0 points 0 points  What time? 0 points 0 points 0 points  Count back from 20 0 points 0 points 0 points  Months in reverse 0 points 2 points 0 points  Repeat phrase 0 points 0 points 0 points  Total Score 0 2 0    Immunizations Immunization History  Administered Date(s) Administered   Fluad Quad(high Dose 65+) 06/12/2020   H1N1 08/13/2008   Influenza Split 07/28/2011, 06/28/2012   Influenza, High Dose Seasonal PF 04/14/2016, 07/10/2017, 04/11/2018, 05/07/2019   Influenza,inj,Quad PF,6+ Mos 07/25/2013, 08/21/2015   Influenza-Unspecified 07/22/2015   Moderna Sars-Covid-2 Vaccination 08/15/2019, 09/12/2019, 06/24/2020   Pneumococcal Conjugate-13 09/13/2013   Pneumococcal Polysaccharide-23 07/30/2011   Zoster 09/13/2013    TDAP status: Due, Education has been provided regarding the importance of this vaccine. Advised may receive this vaccine at local pharmacy or Health Dept. Aware to provide a copy of the vaccination record if obtained from local pharmacy or Health Dept. Verbalized acceptance and understanding. Deferred.   Health Maintenance Health Maintenance  Topic Date Due   TETANUS/TDAP  Never done   FOOT EXAM  09/14/2014   PNA vac Low Risk Adult (2 of 2 - PPSV23) 07/29/2016   OPHTHALMOLOGY EXAM   06/25/2020   HEMOGLOBIN A1C  12/11/2020   URINE MICROALBUMIN  06/12/2021   MAMMOGRAM  06/19/2021   COLONOSCOPY (Pts 45-41yrs Insurance coverage will need to be confirmed)  09/14/2022   INFLUENZA VACCINE  Completed   DEXA SCAN  Completed   COVID-19 Vaccine  Completed   Hepatitis C Screening  Completed   Colorectal cancer screening: Type of screening: Colonoscopy. Completed 09/13/12. Repeat every 10 years  Mammogram status: Completed 06/21/19. Repeat every year  Lung Cancer Screening: (Low Dose CT Chest recommended if Age 64-80 years, 30 pack-year currently smoking OR have quit w/in 15years.) does not qualify.   Hepatitis C Screening: Completed 07/10/17.  Vision Screening: Recommended annual ophthalmology exams for early detection of glaucoma and other disorders of the eye. Is the patient up to date with their annual eye exam?  Yes   Dental Screening: Recommended annual dental exams for proper oral hygiene.  Community Resource Referral / Chronic Care Management: CRR required this visit?  No   CCM required this visit?  No      Plan:   Keep all  routine maintenance appointments.   Follow up CCM 07/21/20 @ 9:00  I have personally reviewed and noted the following in the patient's chart:   Medical and social history Use of alcohol, tobacco or illicit drugs  Current medications and supplements Functional ability and status Nutritional status Physical activity Advanced directives List of other physicians Hospitalizations, surgeries, and ER visits in previous 12 months Vitals Screenings to include cognitive, depression, and falls Referrals and appointments  In addition, I have reviewed and discussed with patient certain preventive protocols, quality metrics, and best practice recommendations. A written personalized care plan for preventive services as well as general preventive health recommendations were provided to patient via mychart.     OBrien-Blaney, Amias Hutchinson L,  LPN   02/15/3253     I have reviewed the above information and agree with above.   Duncan Dull, MD

## 2020-07-21 ENCOUNTER — Ambulatory Visit: Payer: HMO | Admitting: Pharmacist

## 2020-07-21 DIAGNOSIS — E271 Primary adrenocortical insufficiency: Secondary | ICD-10-CM

## 2020-07-21 DIAGNOSIS — E785 Hyperlipidemia, unspecified: Secondary | ICD-10-CM

## 2020-07-21 DIAGNOSIS — E1169 Type 2 diabetes mellitus with other specified complication: Secondary | ICD-10-CM

## 2020-07-21 DIAGNOSIS — I482 Chronic atrial fibrillation, unspecified: Secondary | ICD-10-CM

## 2020-07-21 DIAGNOSIS — E1165 Type 2 diabetes mellitus with hyperglycemia: Secondary | ICD-10-CM

## 2020-07-21 MED ORDER — NOVOLOG FLEXPEN 100 UNIT/ML ~~LOC~~ SOPN: 8.0000 [IU] | PEN_INJECTOR | Freq: Three times a day (TID) | SUBCUTANEOUS | 11 refills | Status: DC

## 2020-07-21 NOTE — Chronic Care Management (AMB) (Signed)
Chronic Care Management Pharmacy Note  07/21/2020 Name:  Haley Young MRN:  [id number] DOB:  August 13, 1950  Subjective: Haley Young is an 70 y.o. year old female who is a primary patient of Derrel Nip, Aris Everts, MD.  The CCM team was consulted for assistance with disease management and care coordination needs.    Engaged with patient by telephone for follow up visit in response to provider referral for pharmacy case management and/or care coordination services.   Consent to Services:  The patient was given information about Chronic Care Management services, agreed to services, and gave verbal consent prior to initiation of services.  Please see initial visit note for detailed documentation.   Objective:  Lab Results  Component Value Date   CREATININE 1.16 (H) 06/12/2020   CREATININE 1.17 (H) 03/20/2020   CREATININE 0.87 05/02/2019    Lab Results  Component Value Date   HGBA1C 13.2 (H) 06/12/2020   C-peptide: 1.21 (06/18/20)     Component Value Date/Time   CHOL 182 06/12/2020 1625   TRIG 114 06/12/2020 1625   HDL 48 (L) 06/12/2020 1625   CHOLHDL 3.8 06/12/2020 1625   VLDL 18.0 05/02/2019 1102   LDLCALC 112 (H) 06/12/2020 1625   LDLDIRECT 165.0 04/14/2016 1012     CHA2DS2-VASc Score = 7  This indicates a 11.2% annual risk of stroke. The patient's score is based upon: CHF History: Yes HTN History: Yes Diabetes History: Yes Stroke History: Yes Vascular Disease History: No Age Score: 1 Gender Score: 1     BP Readings from Last 3 Encounters:  06/12/20 102/64  03/20/20 (!) 143/65  03/20/20 (!) 82/58    Assessment/Interventions: Review of patient past medical history, allergies, medications, health status, including review of consultants reports, laboratory and other test data, was performed as part of comprehensive evaluation and provision of chronic care management services.   SDOH:  (Social Determinants of Health) assessments and interventions performed:  SDOH  Interventions   Flowsheet Row Most Recent Value  SDOH Interventions   Financial Strain Interventions Intervention Not Indicated      CCM Care Plan  Allergies  Allergen Reactions  . Oxycodone Hcl     REACTION: hallucinations  . Albuterol Anxiety    Other reaction(s): Other (See Comments) Tremors   . Oxycodone Hcl Other (See Comments)    halucinations    Medications Reviewed Today    Reviewed by De Hollingshead, RPH-CPP (Pharmacist) on 07/21/20 at [facility]  Med List Status: <None>  Medication Order Taking? Sig Documenting Provider Last Dose Status Informant  apixaban (ELIQUIS) 5 MG TABS tablet [id number] Yes Take 1 tablet (5 mg total) by mouth 2 (two) times daily. [name], MD Taking Active   atorvastatin (LIPITOR) 80 MG tablet [id number] Yes Take 1 tablet (80 mg total) by mouth daily. [name], MD Taking Active   Continuous Blood Gluc Receiver (FREESTYLE LIBRE 2 READER) [place] [id number] Yes Use to check glucose at least TID [name], MD Taking Active   Continuous Blood Gluc Sensor (FREESTYLE LIBRE 2 SENSOR) [place] [id number] Yes Use to check glucose at least TID [name], MD Taking Active   Fludrocortisone Acetate (FLORINEF PO) [id number] Yes Take 0.1 mg by mouth daily. [provider] Taking Active   FLUoxetine (PROZAC) 10 MG tablet [id number] Yes TAKE 1 TABLET BY MOUTH EVERY DAY [name], MD Taking Active   furosemide (LASIX) 20 MG tablet [id number] Yes Take 20 mg by mouth daily.  [provider] Taking Active Self  hydrocortisone (CORTEF) 10 MG tablet [id number] Yes Take 5 mg by mouth 2 (two) times daily. 10 mg QAM, 5 PM [provider] Taking Active Self  insulin aspart (NOVOLOG FLEXPEN) 100 UNIT/ML FlexPen [id number] Yes Inject 6 Units into the skin 3 (three) times daily with meals. [name], MD Taking Active   insulin degludec (TRESIBA FLEXTOUCH) 200 UNIT/ML FlexTouch Pen [id number] Yes Inject 24 units daily. Titrate as  instructed. Max daily dose: 40 units [name], MD Taking Active   JARDIANCE 10 MG TABS tablet [id number] Yes Take 10 mg by mouth every morning. [provider] Taking Active   levothyroxine (SYNTHROID) 100 MCG tablet [id number] Yes Take 100 mcg by mouth daily. [provider] Taking Active   metoprolol succinate (TOPROL-XL) 25 MG 24 hr tablet [id number] Yes Take 25 mg by mouth daily. [provider] Taking Active   mirtazapine (REMERON) 7.5 MG tablet [id number] Yes TAKE 1 TABLET BY MOUTH EVERYDAY AT BEDTIME [name], MD Taking Active   Probiotic Product (PROBIOTIC ADVANCED PO) [id number] Yes Take 1 capsule by mouth daily. [provider] Taking Active   Semaglutide,0.25 or 0.5MG /DOS, (OZEMPIC, 0.25 OR 0.5 MG/DOSE,) 2 MG/1.5ML SOPN [id number] Yes Inject 0.25 mg into the skin. [provider] Taking Active   traZODone (DESYREL) 150 MG tablet [id number] Yes TAKE 1 TABLET (150 MG TOTAL) BY MOUTH AT BEDTIME. [name], MD Taking Active           Patient Active Problem List   Diagnosis Date Noted  . Candida vaginitis 06/14/2020  . Chronic left SI joint pain 05/01/2019  . Transaminitis 05/01/2019  . Diarrhea in adult patient 03/29/2019  . Educated about COVID-19 virus infection 11/15/2018  . Anemia 07/06/2018  . Chronic atrial fibrillation ([place]) 05/25/2018  . Neuropathic pain of hand 05/03/2018  . History of cardiac arrest 05/03/2018  . Cerebral infarction, watershed distribution, bilateral, remote, resolved 05/03/2018  . Chronic systolic heart failure ([place]) 04/26/2018  . Dysphagia, oropharyngeal 04/26/2018  . Chronic constipation 04/26/2018  . Adrenal insufficiency (Addison's disease) ([place]) 01/20/2018  . Hospital discharge follow-up 01/20/2018  . 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 CHB (complete heart block) ([place]) 08/17/2017  . Mobitz type 2 second degree AV block 08/09/2017  . Decreased GFR 05/18/2017  .  Hyponatremia 11/01/2016  . Drug-induced vitamin B12 deficiency anemia 04/16/2016  . Type 2 diabetes mellitus with hyperglycemia, with long-term current use of insulin ([place]) 08/21/2015  . Overweight (BMI 25.0-29.9) 10/07/2014  . Depression with anxiety 08/08/2014  . Encounter for preventive health examination 09/13/2013  . Hyperlipidemia associated with type 2 diabetes mellitus ([place]) 07/25/2013  . Insomnia 07/25/2013  . Hypothyroidism 03/29/2012  . Screening for breast cancer 03/29/2012  . Screening for colon cancer 03/29/2012  . Depression, recurrent ([place]) 08/13/2008  . GERD 08/13/2008  . Osteopenia 08/13/2008  . COLONIC POLYPS, HX OF 08/13/2008    Conditions to be addressed/monitored:  Atrial Fibrillation, CHF, HTN, HLD and DMII  Patient Care Plan: Medication Management    Problem Identified: Diabetes, Addison's Disease, Atrial Fibrillation, Osteoporosis     Long-Range Goal: Disease Progression Prevention   This Visit's Progress: On track  Recent Progress: On track  Priority: High  Note:   Current Barriers:  . Unable to independently monitor therapeutic efficacy . Unable to achieve control of diabetes  . Complex patient with multiple comorbidities, including diabetes, Addison's disease, atrial fibrillation, hx pulmonic valve replacement, osteoporosis  Pharmacist Clinical  Goal(s):  Marland Kitchen Over the next 90 days, patient will verbalize ability to afford treatment regimen. . Over the next 90 days, patient will achieve adherence to monitoring guidelines and medication adherence to achieve therapeutic efficacy. . Over the next 90 days, patient will achieve control of diabetes as evidenced by  improvement in A1c through collaboration with PharmD and provider.   Interventions: . 1:1 collaboration with [name], MD regarding development and update of comprehensive plan of care as evidenced by provider attestation and co-signature . Inter-disciplinary care team collaboration (see  longitudinal plan of care) . Comprehensive medication review performed; medication list updated in electronic medical record  Diabetes: . Uncontrolled; current treatment: Ozempic 0.25 mg x 2 weeks, Lantus 24 units QPM, Jardiance 10 mg daily, Novolog 6 units with meals up to TID  o Hx metformin, but discontinued by endocrinology at recent hospitalization d/t elevated lactic acid and risk of lactic acidosis . C-peptide WNL, indicating endogenous insulin production and likely benefit from GLP1 . Current glucose readings:  Date of Download: 07/08/20-07/21/20 % Time CGM is active: 42% Average Glucose: 228 mg/dL Glucose Management Indicator: unable to calculate  Glucose Variability: 38.5 (goal <36%) Time in Goal:  - Time in range 70-180: 37%  - Time above range: 63% - Time below range: 0% Observed patterns: Improved fastings. Few post prandials that are evident are >300. In the 8 hours before fasting readings, can see that patient's readings were >200 and declined over those 8 hours (see screeshot above)  . Denies hypoglycemic symptoms. . Praised for appropriate Ozempic dosing. Reviewed plan to take 0.25 mg weekly x 4 weeks then increase to 0.5 mg weekly (this should be on 08/06/20).  . Reviewed need to scan at least Q8H. Discussed scanning fasting, after meals, and before bed to capture all data throughout the day. Patient verbalized understanding . Given elevated post prandials and evidence of high readings before bed (trend prior to fastings), increase Novolog to 8 units with meals. Continue Lantus 24 units daily, Ozempic as planned, and Jardiance 10 mg daily. Anticipate need for more Novolog, but given small amount of data at this time, will be more conservative.  . Follow up with PCP in 2 days as scheduled. Evaluate impact of Novolog dose increase, consider further increase.   Atrial Fibrillation, hx pulmonic valve replacement . Appropriately managed, last clinic BP at goal; current treatment:  metoprolol succinate 25 mg daily, furosemide 20 mg daily . Anticoagulant regimen: Eliquis 5 mg BID . Recent UACR does not indicate indication for ACEi/ARB tx at this time . Recommended to continue current regimen at this time  Hyperlipidemia: . Uncontrolled, though anticipated to improve given recent dose increase; current treatment: atorvastatin 80 mg daily  . Recommended to continue current regimen at this time . Due for repeat lipid panel.   Addison's Disease . Appropriately managed; current treatment; hydrocortisone 10 mg QAM, 5 mg QPM; fludrocortisone 0.1 mg daily . Recommend to continue current regimen at this time with collaboration w/ PCP (patient declined continued endocrinology f/u).  Depression/anxiety/insomnia: . Appropriately managed; current treatment: fluoxetine 10 mg daily, trazodone 150 mg QPM, mirtazapine 7.5 mg daily  . Recommend to continue current regimen at this time. Continue to monitor for s/sx serotonin syndrome given high serotonergic burden. Monitor weight gain, appetite w/ mirtazapine - though use of GLP1 should help mitigate this. Continue to discuss non-pharmacological methods to manage insomnia . Fluoxetine may increase concentrations of metoprolol. Monitor BP/HR if fluoxetine dose ever increased.   Hypothyroidism: .  Appropriately managed; levothyroxine 100 mcg daily . Continue current regimen at this time  Osteoporosis: . Untreated; Will discuss treatment, including appropriate calcium + vitamin D and falls risk reduction, moving forward.   Patient Goals/Self-Care Activities . Over the next 30 days, patient will:  - take medications as prescribed check blood glucose TID using CGM, document, and provide at future appointments  Follow Up Plan: Telephone follow up appointment with care management team member scheduled for:~ 3 weeks      Medication Assistance: None required. Patient affirms current coverage meets needs.   Follow Up:  Patient agrees to  Care Plan and Follow-up.  Plan: Telephone follow up appointment with care management team member scheduled for:  ~ 3 weeks  Catie Darnelle Maffucci, PharmD, [place], [place] Clinical Pharmacist [employer] at [employer] [phone]

## 2020-07-21 NOTE — Patient Instructions (Signed)
Visit Information  Patient Care Plan: Medication Management    Problem Identified: Diabetes, Addison's Disease, Atrial Fibrillation, Osteoporosis     Long-Range Goal: Disease Progression Prevention   This Visit's Progress: On track  Recent Progress: On track  Priority: High  Note:   Current Barriers:  . Unable to independently monitor therapeutic efficacy . Unable to achieve control of diabetes  . Complex patient with multiple comorbidities, including diabetes, Addison's disease, atrial fibrillation, hx pulmonic valve replacement, osteoporosis  Pharmacist Clinical Goal(s):  Marland Kitchen Over the next 90 days, patient will verbalize ability to afford treatment regimen. . Over the next 90 days, patient will achieve adherence to monitoring guidelines and medication adherence to achieve therapeutic efficacy. . Over the next 90 days, patient will achieve control of diabetes as evidenced by  improvement in A1c through collaboration with PharmD and provider.   Interventions: . 1:1 collaboration with [name], MD regarding development and update of comprehensive plan of care as evidenced by provider attestation and co-signature . Inter-disciplinary care team collaboration (see longitudinal plan of care) . Comprehensive medication review performed; medication list updated in electronic medical record  Diabetes: . Uncontrolled; current treatment: Ozempic 0.25 mg x 2 weeks, Lantus 24 units QPM, Jardiance 10 mg daily, Novolog 6 units with meals up to TID  o Hx metformin, but discontinued by endocrinology at recent hospitalization d/t elevated lactic acid and risk of lactic acidosis . C-peptide WNL, indicating endogenous insulin production and likely benefit from GLP1 . Current glucose readings:  Date of Download: 07/08/20-07/21/20 % Time CGM is active: 42% Average Glucose: 228 mg/dL Glucose Management Indicator: unable to calculate  Glucose Variability: 38.5 (goal <36%) Time in Goal:  - Time in  range 70-180: 37%  - Time above range: 63% - Time below range: 0% Observed patterns: Improved fastings. Few post prandials that are evident are >300. In the 8 hours before fasting readings, can see that patient's readings were >200 and declined over those 8 hours (see screeshot above)  . Denies hypoglycemic symptoms. . Praised for appropriate Ozempic dosing. Reviewed plan to take 0.25 mg weekly x 4 weeks then increase to 0.5 mg weekly (this should be on 08/06/20).  . Reviewed need to scan at least Q8H. Discussed scanning fasting, after meals, and before bed to capture all data throughout the day. Patient verbalized understanding . Given elevated post prandials and evidence of high readings before bed (trend prior to fastings), increase Novolog to 8 units with meals. Continue Lantus 24 units daily, Ozempic as planned, and Jardiance 10 mg daily. Anticipate need for more Novolog, but given small amount of data at this time, will be more conservative.  . Follow up with PCP in 2 days as scheduled. Evaluate impact of Novolog dose increase, consider further increase.   Atrial Fibrillation, hx pulmonic valve replacement . Appropriately managed, last clinic BP at goal; current treatment: metoprolol succinate 25 mg daily, furosemide 20 mg daily . Anticoagulant regimen: Eliquis 5 mg BID . Recent UACR does not indicate indication for ACEi/ARB tx at this time . Recommended to continue current regimen at this time  Hyperlipidemia: . Uncontrolled, though anticipated to improve given recent dose increase; current treatment: atorvastatin 80 mg daily  . Recommended to continue current regimen at this time . Due for repeat lipid panel.   Addison's Disease . Appropriately managed; current treatment; hydrocortisone 10 mg QAM, 5 mg QPM; fludrocortisone 0.1 mg daily . Recommend to continue current regimen at this time with collaboration w/ PCP (patient  declined continued endocrinology  f/u).  Depression/anxiety/insomnia: . Appropriately managed; current treatment: fluoxetine 10 mg daily, trazodone 150 mg QPM, mirtazapine 7.5 mg daily  . Recommend to continue current regimen at this time. Continue to monitor for s/sx serotonin syndrome given high serotonergic burden. Monitor weight gain, appetite w/ mirtazapine - though use of GLP1 should help mitigate this. Continue to discuss non-pharmacological methods to manage insomnia . Fluoxetine may increase concentrations of metoprolol. Monitor BP/HR if fluoxetine dose ever increased.   Hypothyroidism: . Appropriately managed; levothyroxine 100 mcg daily . Continue current regimen at this time  Osteoporosis: . Untreated; Will discuss treatment, including appropriate calcium + vitamin D and falls risk reduction, moving forward.   Patient Goals/Self-Care Activities . Over the next 30 days, patient will:  - take medications as prescribed check blood glucose TID using CGM, document, and provide at future appointments  Follow Up Plan: Telephone follow up appointment with care management team member scheduled for:~ 3 weeks      The patient verbalized understanding of instructions, educational materials, and care plan provided today and declined offer to receive copy of patient instructions, educational materials, and care plan.    Plan: Telephone follow up appointment with care management team member scheduled for:  ~ 3 weeks  Catie Darnelle Maffucci, PharmD, [place], [place] Clinical Pharmacist [employer] at [employer] [phone]

## 2020-07-23 ENCOUNTER — Ambulatory Visit: Payer: Self-pay | Admitting: Internal Medicine

## 2020-07-23 ENCOUNTER — Encounter: Payer: Self-pay | Admitting: Internal Medicine

## 2020-07-27 ENCOUNTER — Encounter: Payer: Self-pay | Admitting: Internal Medicine

## 2020-07-27 ENCOUNTER — Telehealth (INDEPENDENT_AMBULATORY_CARE_PROVIDER_SITE_OTHER): Payer: HMO | Admitting: Internal Medicine

## 2020-07-27 DIAGNOSIS — Z794 Long term (current) use of insulin: Secondary | ICD-10-CM

## 2020-07-27 DIAGNOSIS — E1169 Type 2 diabetes mellitus with other specified complication: Secondary | ICD-10-CM

## 2020-07-27 DIAGNOSIS — E785 Hyperlipidemia, unspecified: Secondary | ICD-10-CM | POA: Diagnosis not present

## 2020-07-27 DIAGNOSIS — E1165 Type 2 diabetes mellitus with hyperglycemia: Secondary | ICD-10-CM

## 2020-07-27 NOTE — Assessment & Plan Note (Signed)
Uncontrolled by recent a1c of 13.  Patient now has a CBG monitor and is now ready to start checking pre and post prandial sugars each meal   She is willing to download  them weekly for adjustment of her mealtime insulin.  Advised to reduce mealtime insulin to 4 units for pre meal cbg < 150 unless she is eating a full meal.

## 2020-07-27 NOTE — Progress Notes (Signed)
Virtual Visit via [place]  Note  This visit type was conducted due to national recommendations for restrictions regarding the COVID-19 pandemic (e.g. social distancing).  This format is felt to be most appropriate for this patient at this time.  All issues noted in this document were discussed and addressed.  No physical exam was performed (except for noted visual exam findings with Video Visits).   I connected with@ on 07/27/20 at 11:00 AM EST by a video enabled telemedicine application  and verified that I am speaking with the correct person using two identifiers. Location patient: home Location provider: work or home office Persons participating in the virtual visit: patient, provider  I discussed the limitations, risks, security and privacy concerns of performing an evaluation and management service by telephone and the availability of in person appointments. I also discussed with the patient that there may be a patient responsible charge related to this service. The patient expressed understanding and agreed to proceed.  Reason for visit: follow up  HPI:  70 yr old female with history of type 2 DM, now uncontrolled (a1c 13.2) presents for follow up  Patient does not have her information downloaded.  Has been taking 25 units Lantus and 8 units NOvolog tid.  One low BS of 70 last week after eating only a salad for dinner.  Not checking 2 hr post prandials yet due to lack of understanding.  Has one 1 hr post prandial of 246 to report   ROS: See pertinent positives and negatives per HPI.  Past Medical History:  Diagnosis Date  . Asthma   . Chicken pox   . Depression   . Diabetes mellitus   . Frequent UTI   . GERD (gastroesophageal reflux disease)   . Heart disease   . Heart murmur   . 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 Hx of colonic polyps   . Hyperhidrosis    especially with hot flashes    . Hyperlipidemia   . Lipoma    R axilla   . MRSA  cellulitis    surgical wound infection  . Osteoporosis   . Pyelocystitis    as a child   . Thyroid disease   . Urinary incontinence     Past Surgical History:  Procedure Laterality Date  . ABDOMINAL HYSTERECTOMY  1970  . [place], 2005   pulmonary stenosis with valve replacement, Dr. Evelina Dun  . PARTIAL HYSTERECTOMY  1970  . PULMONARY VEIN STENOSIS REPAIR    . VALVE REPLACEMENT      Family History  Problem Relation Age of Onset  . Coarctation of the aorta Sister   . Diabetes Mother   . COPD Mother   . Obesity Mother   . Depression Mother   . Pancreatic cancer Mother   . Alcohol abuse Father   . Cirrhosis Father   . Down syndrome Brother   . Pneumonia Brother   . Down syndrome Daughter   . Lupus Daughter   . Ulcerative colitis Daughter   . Diabetes Maternal Aunt   . Breast cancer Maternal Aunt   . Breast cancer Maternal Aunt     SOCIAL HX:  reports that she has never smoked. She has never used smokeless tobacco. She reports current alcohol use. She reports that she does not use drugs.   Current Outpatient Medications:  .  apixaban (ELIQUIS) 5 MG TABS tablet, Take 1 tablet (5 mg total) by mouth 2 (two)  times daily., Disp: 60 tablet, Rfl: 11 .  atorvastatin (LIPITOR) 80 MG tablet, Take 1 tablet (80 mg total) by mouth daily., Disp: 90 tablet, Rfl: 1 .  Continuous Blood Gluc Receiver (FREESTYLE LIBRE 2 READER) DEVI, Use to check glucose at least TID, Disp: 1 each, Rfl: 1 .  Continuous Blood Gluc Sensor (FREESTYLE LIBRE 2 SENSOR) MISC, Use to check glucose at least TID, Disp: 2 each, Rfl: 11 .  Fludrocortisone Acetate (FLORINEF PO), Take 0.1 mg by mouth daily., Disp: , Rfl:  .  FLUoxetine (PROZAC) 10 MG tablet, TAKE 1 TABLET BY MOUTH EVERY DAY, Disp: 90 tablet, Rfl: 1 .  furosemide (LASIX) 20 MG tablet, Take 20 mg by mouth daily. , Disp: , Rfl:  .  hydrocortisone (CORTEF) 10 MG tablet, Take 5 mg by mouth 2 (two) times daily. 10 mg QAM, 5 PM, Disp: , Rfl:  .   insulin aspart (NOVOLOG FLEXPEN) 100 UNIT/ML FlexPen, Inject 8 Units into the skin 3 (three) times daily with meals., Disp: 15 mL, Rfl: 11 .  insulin degludec (TRESIBA FLEXTOUCH) 200 UNIT/ML FlexTouch Pen, Inject 24 units daily. Titrate as instructed. Max daily dose: 40 units, Disp: 18 mL, Rfl: 2 .  JARDIANCE 10 MG TABS tablet, Take 10 mg by mouth every morning., Disp: , Rfl:  .  levothyroxine (SYNTHROID) 100 MCG tablet, Take 100 mcg by mouth daily., Disp: , Rfl:  .  metoprolol succinate (TOPROL-XL) 25 MG 24 hr tablet, Take 25 mg by mouth daily., Disp: , Rfl:  .  mirtazapine (REMERON) 7.5 MG tablet, TAKE 1 TABLET BY MOUTH EVERYDAY AT BEDTIME, Disp: 90 tablet, Rfl: 1 .  Probiotic Product (PROBIOTIC ADVANCED PO), Take 1 capsule by mouth daily., Disp: , Rfl:  .  Semaglutide,0.25 or 0.5MG /DOS, (OZEMPIC, 0.25 OR 0.5 MG/DOSE,) 2 MG/1.5ML SOPN, Inject 0.25 mg into the skin., Disp: , Rfl:  .  traZODone (DESYREL) 150 MG tablet, TAKE 1 TABLET (150 MG TOTAL) BY MOUTH AT BEDTIME., Disp: 90 tablet, Rfl: 1  EXAM:  VITALS per patient if applicable:  GENERAL: alert, oriented, appears well and in no acute distress  HEENT: atraumatic, conjunttiva clear, no obvious abnormalities on inspection of external nose and ears  NECK: normal movements of the head and neck  LUNGS: on inspection no signs of respiratory distress, breathing rate appears normal, no obvious gross SOB, gasping or wheezing  CV: no obvious cyanosis  MS: moves all visible extremities without noticeable abnormality  PSYCH/NEURO: pleasant and cooperative, no obvious depression or anxiety, speech and thought processing grossly intact  ASSESSMENT AND PLAN:  Discussed the following assessment and plan:  Type 2 diabetes mellitus with hyperglycemia, with long-term current use of insulin (HCC) - Plan: Hemoglobin A1c, Comprehensive metabolic panel  Hyperlipidemia associated with type 2 diabetes mellitus (HCC) - Plan: Lipid panel  Type 2  diabetes mellitus with hyperglycemia, with long-term current use of insulin (HCC) Uncontrolled by recent a1c of 13.  Patient now has a CBG monitor and is now ready to start checking pre and post prandial sugars each meal   She is willing to download  them weekly for adjustment of her mealtime insulin.  Advised to reduce mealtime insulin to 4 units for pre meal cbg < 150 unless she is eating a full meal.    Hyperlipidemia associated with type 2 diabetes mellitus ([place]) She is tolerating the increase in atorvastatin to 80 mg . Repeat lipids will be done in march with a1c    I discussed the assessment and  treatment plan with the patient. The patient was provided an opportunity to ask questions and all were answered. The patient agreed with the plan and demonstrated an understanding of the instructions.   The patient was advised to call back or seek an in-person evaluation if the symptoms worsen or if the condition fails to improve as anticipate.  [name], MD    I provided  30 minutes of  face-to-face time during this encounter reviewing patient's current problems and past surgeries, labs and imaging studies, providing counseling on the above mentioned problems , and coordination  of care .

## 2020-07-27 NOTE — Progress Notes (Signed)
Diabetic Eye Exam is scheduled for February.

## 2020-07-27 NOTE — Assessment & Plan Note (Signed)
She is tolerating the increase in atorvastatin to 80 mg . Repeat lipids will be done in march with a1c

## 2020-07-29 ENCOUNTER — Other Ambulatory Visit: Payer: Self-pay | Admitting: Internal Medicine

## 2020-08-05 ENCOUNTER — Other Ambulatory Visit: Payer: Self-pay

## 2020-08-05 ENCOUNTER — Ambulatory Visit: Payer: HMO

## 2020-08-12 ENCOUNTER — Telehealth: Payer: Self-pay | Admitting: Internal Medicine

## 2020-08-12 ENCOUNTER — Ambulatory Visit (INDEPENDENT_AMBULATORY_CARE_PROVIDER_SITE_OTHER): Payer: HMO | Admitting: Pharmacist

## 2020-08-12 DIAGNOSIS — E785 Hyperlipidemia, unspecified: Secondary | ICD-10-CM

## 2020-08-12 DIAGNOSIS — I482 Chronic atrial fibrillation, unspecified: Secondary | ICD-10-CM

## 2020-08-12 DIAGNOSIS — F339 Major depressive disorder, recurrent, unspecified: Secondary | ICD-10-CM

## 2020-08-12 DIAGNOSIS — Z794 Long term (current) use of insulin: Secondary | ICD-10-CM

## 2020-08-12 DIAGNOSIS — E1169 Type 2 diabetes mellitus with other specified complication: Secondary | ICD-10-CM

## 2020-08-12 DIAGNOSIS — E271 Primary adrenocortical insufficiency: Secondary | ICD-10-CM

## 2020-08-12 DIAGNOSIS — E1165 Type 2 diabetes mellitus with hyperglycemia: Secondary | ICD-10-CM

## 2020-08-12 MED ORDER — TRESIBA FLEXTOUCH 200 UNIT/ML ~~LOC~~ SOPN: PEN_INJECTOR | SUBCUTANEOUS | 2 refills | Status: DC

## 2020-08-12 MED ORDER — OZEMPIC (0.25 OR 0.5 MG/DOSE) 2 MG/1.5ML ~~LOC~~ SOPN: 0.5000 mg | PEN_INJECTOR | SUBCUTANEOUS | 3 refills | Status: DC

## 2020-08-12 NOTE — Patient Instructions (Signed)
Visit Information  PATIENT GOALS: Goals Addressed              This Visit's Progress     Patient Stated   .  Medication Management (pt-stated)        Patient Goals/Self-Care Activities . Over the next 30 days, patient will:  - take medications as prescribed check blood glucose TID using CGM, document, and provide at future appointments        Patient verbalizes understanding of instructions provided today and agrees to view in [place].   Plan: Telephone follow up appointment with care management team member scheduled for:  ~ 3 weeks  Catie Darnelle Maffucci, PharmD, [place], [place] Clinical Pharmacist [employer] [facility] [employer] [phone]

## 2020-08-12 NOTE — Telephone Encounter (Signed)
See CCM documentation 

## 2020-08-12 NOTE — Chronic Care Management (AMB) (Signed)
Chronic Care Management Pharmacy Note  08/12/2020 Name:  Haley Young MRN:  [id number] DOB:  December 27, 1950  Subjective: Haley Young is an 70 y.o. year old female who is a primary patient of Derrel Nip, Aris Everts, MD.  The CCM team was consulted for assistance with disease management and care coordination needs.    Engaged with patient by telephone for follow up visit in response to provider referral for pharmacy case management and/or care coordination services.   Consent to Services:  The patient was given information about Chronic Care Management services, agreed to services, and gave verbal consent prior to initiation of services.  Please see initial visit note for detailed documentation.   Objective:  Lab Results  Component Value Date   CREATININE 1.16 (H) 06/12/2020   CREATININE 1.17 (H) 03/20/2020   CREATININE 0.87 05/02/2019    Lab Results  Component Value Date   HGBA1C 13.2 (H) 06/12/2020       Component Value Date/Time   CHOL 182 06/12/2020 1625   TRIG 114 06/12/2020 1625   HDL 48 (L) 06/12/2020 1625   CHOLHDL 3.8 06/12/2020 1625   VLDL 18.0 05/02/2019 1102   LDLCALC 112 (H) 06/12/2020 1625   LDLDIRECT 165.0 04/14/2016 1012     BP Readings from Last 3 Encounters:  06/12/20 102/64  03/20/20 (!) 143/65  03/20/20 (!) 82/58    Assessment: Review of patient past medical history, allergies, medications, health status, including review of consultants reports, laboratory and other test data, was performed as part of comprehensive evaluation and provision of chronic care management services.   SDOH:  (Social Determinants of Health) assessments and interventions performed:    CCM Care Plan  Allergies  Allergen Reactions  . Oxycodone Hcl     REACTION: hallucinations  . Albuterol Anxiety    Other reaction(s): Other (See Comments) Tremors   . Oxycodone Hcl Other (See Comments)    halucinations    Medications Reviewed Today    Reviewed by De Hollingshead,  RPH-CPP (Pharmacist) on 08/12/20 at 1117  Med List Status: <None>  Medication Order Taking? Sig Documenting Provider Last Dose Status Informant  apixaban (ELIQUIS) 5 MG TABS tablet [id number] Yes Take 1 tablet (5 mg total) by mouth 2 (two) times daily. [name], MD Taking Active   atorvastatin (LIPITOR) 80 MG tablet [id number] Yes Take 1 tablet (80 mg total) by mouth daily. [name], MD Taking Active   Continuous Blood Gluc Receiver (FREESTYLE LIBRE 2 READER) DEVI [id number] Yes Use to check glucose at least TID [name], MD Taking Active   Continuous Blood Gluc Sensor (FREESTYLE LIBRE 2 SENSOR) [place] [id number] Yes Use to check glucose at least TID [name], MD Taking Active   Fludrocortisone Acetate (FLORINEF PO) [id number] Yes Take 0.1 mg by mouth daily. [provider] Taking Active   FLUoxetine (PROZAC) 10 MG tablet [id number] Yes TAKE 1 TABLET BY MOUTH EVERY DAY [name], MD Taking Active   furosemide (LASIX) 20 MG tablet [id number] Yes Take 20 mg by mouth daily.  [provider] Taking Active Self  hydrocortisone (CORTEF) 10 MG tablet [id number] Yes Take 5 mg by mouth 2 (two) times daily. 10 mg QAM, 5 PM [provider] Taking Active Self  insulin aspart (NOVOLOG FLEXPEN) 100 UNIT/ML FlexPen [id number] Yes Inject 8 Units into the skin 3 (three) times daily with meals. [name], MD Taking Active   insulin degludec [facility]) 200 UNIT/ML FlexTouch Pen [id number]  Yes Inject 24 units daily. Titrate as instructed. Max daily dose: 40 units [name], MD Taking Active   JARDIANCE 10 MG TABS tablet [id number] Yes Take 10 mg by mouth every morning. [provider] Taking Active   levothyroxine (SYNTHROID) 100 MCG tablet [id number] Yes Take 100 mcg by mouth daily. [provider] Taking Active   metoprolol succinate (TOPROL-XL) 25 MG 24 hr tablet [id number] Yes Take 25 mg by mouth daily. [provider] Taking  Active   mirtazapine (REMERON) 7.5 MG tablet [id number] Yes TAKE 1 TABLET BY MOUTH EVERYDAY AT BEDTIME [name], MD Taking Active   Probiotic Product (PROBIOTIC ADVANCED PO) [id number] Yes Take 1 capsule by mouth daily. [provider] Taking Active   Semaglutide,0.25 or 0.5MG /DOS, (OZEMPIC, 0.25 OR 0.5 MG/DOSE,) 2 MG/1.5ML SOPN [id number] Yes Inject 0.25 mg into the skin. [provider] Taking Active   traZODone (DESYREL) 150 MG tablet [id number] Yes TAKE 1 TABLET (150 MG TOTAL) BY MOUTH AT BEDTIME. [name], MD Taking Active           Patient Active Problem List   Diagnosis Date Noted  . Candida vaginitis 06/14/2020  . Chronic left SI joint pain 05/01/2019  . Transaminitis 05/01/2019  . Diarrhea in adult patient 03/29/2019  . Educated about COVID-19 virus infection 11/15/2018  . Anemia 07/06/2018  . Chronic atrial fibrillation ([place]) 05/25/2018  . Neuropathic pain of hand 05/03/2018  . History of cardiac arrest 05/03/2018  . Cerebral infarction, watershed distribution, bilateral, remote, resolved 05/03/2018  . Chronic systolic heart failure ([place]) 04/26/2018  . Dysphagia, oropharyngeal 04/26/2018  . Chronic constipation 04/26/2018  . Adrenal insufficiency (Addison's disease) ([place]) 01/20/2018  . Hospital discharge follow-up 01/20/2018  . 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 CHB (complete heart block) ([place]) 08/17/2017  . Mobitz type 2 second degree AV block 08/09/2017  . Decreased GFR 05/18/2017  . Hyponatremia 11/01/2016  . Drug-induced vitamin B12 deficiency anemia 04/16/2016  . Type 2 diabetes mellitus with hyperglycemia, with long-term current use of insulin ([place]) 08/21/2015  . Overweight (BMI 25.0-29.9) 10/07/2014  . Depression with anxiety 08/08/2014  . Encounter for preventive health examination 09/13/2013  . Hyperlipidemia associated with type 2 diabetes mellitus ([place]) 07/25/2013  . Insomnia 07/25/2013  .  Hypothyroidism 03/29/2012  . Screening for breast cancer 03/29/2012  . Screening for colon cancer 03/29/2012  . Depression, recurrent ([place]) 08/13/2008  . GERD 08/13/2008  . Osteopenia 08/13/2008  . COLONIC POLYPS, HX OF 08/13/2008    Conditions to be addressed/monitored: Atrial Fibrillation, HTN, HLD and DMII  Care Plan : Medication Management  Updates made by De Hollingshead, RPH-CPP since 08/12/2020 12:00 AM    Problem: Diabetes, Addison's Disease, Atrial Fibrillation, Osteoporosis     Long-Range Goal: Disease Progression Prevention   Recent Progress: On track  Priority: High  Note:   Current Barriers:  . Unable to independently monitor therapeutic efficacy . Unable to achieve control of diabetes  . Complex patient with multiple comorbidities, including diabetes, Addison's disease, atrial fibrillation, hx pulmonic valve replacement, osteoporosis  Pharmacist Clinical Goal(s):  Marland Kitchen Over the next 90 days, patient will verbalize ability to afford treatment regimen. . Over the next 90 days, patient will achieve adherence to monitoring guidelines and medication adherence to achieve therapeutic efficacy. . Over the next 90 days, patient will achieve control of diabetes as evidenced by  improvement in A1c through collaboration with PharmD and provider.   Interventions: . 1:1 collaboration with  [name], MD regarding development and update of comprehensive plan of care as evidenced by provider attestation and co-signature . Inter-disciplinary care team collaboration (see longitudinal plan of care) . Comprehensive medication review performed; medication list updated in electronic medical record  Diabetes: . Uncontrolled; current treatment: Ozempic 0.25 mg, Lantus 24 units QPM, Jardiance 10 mg daily, Novolog 6 units with meals up to TID - however, patient reports that she is only taking Novolog with supper. Usually not eating lunch, but does consistently eat breakfast o Hx  metformin, but discontinued by endocrinology at recent hospitalization d/t elevated lactic acid and risk of lactic acidosis . C-peptide WNL, indicating endogenous insulin production and likely benefit from GLP1 . Current glucose readings:  Date of Download: 1/20-08/12/20 % Time CGM is active: 53% Average Glucose: 230 mg/dL Glucose Management Indicator: unable to calculate  Glucose Variability: 38.3 (goal <36%) Time in Goal:  - Time in range 70-180: 39%  - Time above range: 61% - Time below range: 0% Observed patterns: Generally well controlled fastings with some fasting hypoglycemia. Significant post breakfast elevations. Not enough data to determine post supper patterns . Increase Ozempic to 0.5 mg weekly. Patient will increase tomorrow, as she injects on Thursdays. . Reviewed need to scan at least Q8H. Discussed scanning fasting, after meals, and before bed to capture all data throughout the day. Patient verbalized understanding . Given increase in Ozempic, reduce Tresiba to 18 units daily to reduce risk of hypoglycemia. Add Novolog 8 units with breakfast, continue 8 units before supper. Discussed that if she skips a meal, to skip Novolog, and to use Novolog with lunch if she does have a day that she eats 3 meals. Continue Jardiance 10 mg daily.  Atrial Fibrillation, hx pulmonic valve replacement . Appropriately managed, last clinic BP at goal; current treatment: metoprolol succinate 25 mg daily, furosemide 20 mg daily . Anticoagulant regimen: Eliquis 5 mg BID . Recent UACR does not indicate indication for ACEi/ARB tx at this time . Recommended to continue current regimen at this time  Hyperlipidemia: . Uncontrolled, though anticipated to improve given recent dose increase; current treatment: atorvastatin 80 mg daily  . Recommended to continue current regimen at this time . Due for repeat lipid panel. Ordered to be done with next A1c.  Addison's Disease . Appropriately managed; current  treatment; hydrocortisone 10 mg QAM, 5 mg QPM; fludrocortisone 0.1 mg daily . Recommend to continue current regimen at this time with collaboration w/ PCP (patient declined continued endocrinology f/u).  . Monitor for nausea/vomiting w/ GLP1 and need to consider stress dosing.   Depression/anxiety/insomnia: . Appropriately managed; current treatment: fluoxetine 10 mg daily, trazodone 150 mg QPM, mirtazapine 7.5 mg daily  . Recommend to continue current regimen at this time. Continue to monitor for s/sx serotonin syndrome given high serotonergic burden. Monitor weight gain, appetite w/ mirtazapine - though use of GLP1 should help mitigate this. Continue to discuss non-pharmacological methods to manage insomnia. . Fluoxetine may increase concentrations of metoprolol. Monitor BP/HR if fluoxetine dose ever increased.   Hypothyroidism: . Appropriately managed; levothyroxine 100 mcg daily . Continue current regimen at this time  Osteoporosis: . Untreated; Will discuss treatment, including appropriate calcium + vitamin D and falls risk reduction moving forward.  Patient Goals/Self-Care Activities . Over the next 30 days, patient will:  - take medications as prescribed check blood glucose TID using CGM, document, and provide at future appointments  Follow Up Plan: Telephone follow up appointment with care management team member scheduled  for:~ 3 weeks      Medication Assistance: None required.  Patient affirms current coverage meets needs.  Follow Up:  Patient agrees to Care Plan and Follow-up.  Plan: Telephone follow up appointment with care management team member scheduled for:  ~ 3 weeks  Catie Darnelle Maffucci, PharmD, [place], [place] Clinical Pharmacist [employer] at [employer] [phone]

## 2020-08-12 NOTE — Telephone Encounter (Signed)
Patient stated that she was returning she missed pharmacy call

## 2020-08-16 ENCOUNTER — Other Ambulatory Visit: Payer: Self-pay | Admitting: Internal Medicine

## 2020-08-25 DIAGNOSIS — M25551 Pain in right hip: Secondary | ICD-10-CM | POA: Diagnosis not present

## 2020-08-25 DIAGNOSIS — M7062 Trochanteric bursitis, left hip: Secondary | ICD-10-CM | POA: Diagnosis not present

## 2020-09-02 ENCOUNTER — Ambulatory Visit: Payer: HMO | Admitting: Pharmacist

## 2020-09-02 DIAGNOSIS — Z794 Long term (current) use of insulin: Secondary | ICD-10-CM

## 2020-09-02 DIAGNOSIS — F339 Major depressive disorder, recurrent, unspecified: Secondary | ICD-10-CM | POA: Diagnosis not present

## 2020-09-02 DIAGNOSIS — E1169 Type 2 diabetes mellitus with other specified complication: Secondary | ICD-10-CM | POA: Diagnosis not present

## 2020-09-02 DIAGNOSIS — I482 Chronic atrial fibrillation, unspecified: Secondary | ICD-10-CM | POA: Diagnosis not present

## 2020-09-02 DIAGNOSIS — E1165 Type 2 diabetes mellitus with hyperglycemia: Secondary | ICD-10-CM | POA: Diagnosis not present

## 2020-09-02 DIAGNOSIS — E785 Hyperlipidemia, unspecified: Secondary | ICD-10-CM | POA: Diagnosis not present

## 2020-09-02 DIAGNOSIS — E271 Primary adrenocortical insufficiency: Secondary | ICD-10-CM

## 2020-09-02 MED ORDER — NOVOLOG FLEXPEN 100 UNIT/ML ~~LOC~~ SOPN: PEN_INJECTOR | SUBCUTANEOUS | 3 refills | Status: DC

## 2020-09-02 NOTE — Progress Notes (Signed)
Chronic Care Management Pharmacy Note  09/02/2020 Name:  Haley Young MRN:  [id number] DOB:  [birth year]  Subjective: SHELLA Young is an 70 y.o. year old female who is a primary patient of Derrel Nip, Aris Everts, MD.  The CCM team was consulted for assistance with disease management and care coordination needs.    Engaged with patient by telephone for follow up visit in response to provider referral for pharmacy case management and/or care coordination services.   Consent to Services:  The patient was given information about Chronic Care Management services, agreed to services, and gave verbal consent prior to initiation of services.  Please see initial visit note for detailed documentation.   Patient Care Team: [name], MD as PCP - General (Internal Medicine) De Hollingshead, RPH-CPP (Pharmacist)  Recent office visits: None since our last appointment  Recent consult visits:  2/15- ortho - hip pain; referred to PT, ice, focus on DM control if steroid injection eventually needed  Hospital visits: None in previous 6 months  Objective:  Lab Results  Component Value Date   CREATININE 1.16 (H) 06/12/2020   BUN 24 06/12/2020   GFR 64.23 05/02/2019   GFRNONAA 47 (L) 03/20/2020   GFRAA 54 (L) 03/20/2020   NA 133 (L) 06/12/2020   K 5.0 06/12/2020   CALCIUM 10.2 06/12/2020   CO2 28 06/12/2020    Lab Results  Component Value Date/Time   HGBA1C 13.2 (H) 06/12/2020 04:25 PM   HGBA1C 7.8 (H) 05/02/2019 11:02 AM   HGBA1C 9.5 06/29/2018 12:00 AM   HGBA1C 9.3 (H) 07/26/2011 04:36 AM   FRUCTOSAMINE 282 (H) 05/11/2017 04:01 PM   FRUCTOSAMINE 318 (H) 09/26/2016 10:16 AM   GFR 64.23 05/02/2019 11:02 AM   GFR 69.83 11/27/2018 08:41 AM   MICROALBUR 0.5 06/12/2020 04:25 PM   MICROALBUR 3.0 (H) 05/02/2019 11:02 AM    Last diabetic Eye exam:  Lab Results  Component Value Date/Time   HMDIABEYEEXA No Retinopathy 06/26/2019 12:00 AM    Last diabetic Foot exam:  Lab Results   Component Value Date/Time   HMDIABFOOTEX Normal 09/13/2013 12:00 AM     Lab Results  Component Value Date   CHOL 182 06/12/2020   HDL 48 (L) 06/12/2020   LDLCALC 112 (H) 06/12/2020   LDLDIRECT 165.0 04/14/2016   TRIG 114 06/12/2020   CHOLHDL 3.8 06/12/2020    Hepatic Function Latest Ref Rng & Units 06/12/2020 03/20/2020 05/02/2019  Total Protein 6.1 - 8.1 g/dL 7.3 7.3 6.7  Albumin 3.5 - 5.0 g/dL - 4.0 3.9  AST 10 - 35 U/L 36(H) 33 36  ALT 6 - 29 U/L 32(H) 25 28  Alk Phosphatase 38 - 126 U/L - 85 102  Total Bilirubin 0.2 - 1.2 mg/dL 0.5 1.0 0.4  Bilirubin, Direct 0.0 - 0.2 mg/dL - 0.2 -    Lab Results  Component Value Date/Time   TSH 4.112 03/20/2020 01:32 PM   TSH 0.76 05/02/2019 11:02 AM   TSH 0.29 (A) 08/30/2018 12:00 AM   TSH 3.54 01/17/2018 11:45 AM   FREET4 1.71 (H) 01/25/2016 11:04 AM   FREET4 1.19 08/21/2015 10:49 AM    CBC Latest Ref Rng & Units 03/20/2020 08/30/2018 06/29/2018  WBC 4.0 - 10.5 K/uL 13.0(H) 17.0 -  Hemoglobin 12.0 - 15.0 g/dL 15.6(H) - 15.4  Hematocrit 36.0 - 46.0 % 48.9(H) - -  Platelets 150 - 400 K/uL 164 - -    Lab Results  Component Value Date/Time   VD25OH  30.95 04/14/2016 10:12 AM   VD25OH 44 09/13/2013 09:34 AM   VD25OH 29 (L) 12/08/2009 08:42 PM    CHA2DS2-VASc Score = 7  This indicates a 11.2% annual risk of stroke. The patient's score is based upon: CHF History: Yes HTN History: Yes Diabetes History: Yes Stroke History: Yes Vascular Disease History: No Age Score: 1 Gender Score: 1  Clinical ASCVD: Yes  The ASCVD Risk score Mikey Bussing DC Jr., et al., 2013) failed to calculate for the following reasons:   The patient has a prior MI or stroke diagnosis    Depression screen [facility] 2/9 07/27/2020 07/15/2020 07/15/2019  Decreased Interest 0 0 0  Down, Depressed, Hopeless 0 0 1  PHQ - 2 Score 0 0 1  Altered sleeping 1 - -  Tired, decreased energy 1 - -  Change in appetite 0 - -  Feeling bad or failure about yourself  0 - -  Trouble  concentrating 0 - -  Moving slowly or fidgety/restless 0 - -  Suicidal thoughts 0 - -  PHQ-9 Score 2 - -  Difficult doing work/chores Somewhat difficult - -  Some recent data might be hidden      Social History   Tobacco Use  Smoking Status Never Smoker  Smokeless Tobacco Never Used   BP Readings from Last 3 Encounters:  06/12/20 102/64  03/20/20 (!) 143/65  03/20/20 (!) 82/58   Pulse Readings from Last 3 Encounters:  06/12/20 87  03/20/20 97  03/20/20 89   Wt Readings from Last 3 Encounters:  07/27/20 142 lb (64.4 kg)  07/15/20 136 lb (61.7 kg)  06/12/20 136 lb 6.4 oz (61.9 kg)    Assessment/Interventions: Review of patient past medical history, allergies, medications, health status, including review of consultants reports, laboratory and other test data, was performed as part of comprehensive evaluation and provision of chronic care management services.   SDOH:  (Social Determinants of Health) assessments and interventions performed: Yes SDOH Interventions   Flowsheet Row Most Recent Value  SDOH Interventions   Financial Strain Interventions Other (Comment)  [evaluated patient assistance]      CCM Care Plan  Allergies  Allergen Reactions   Oxycodone Hcl     REACTION: hallucinations   Albuterol Anxiety    Other reaction(s): Other (See Comments) Tremors    Oxycodone Hcl Other (See Comments)    halucinations    Medications Reviewed Today    Reviewed by De Hollingshead, RPH-CPP (Pharmacist) on 09/02/20 at 1037  Med List Status: <None>  Medication Order Taking? Sig Documenting Provider Last Dose Status Informant  apixaban (ELIQUIS) 5 MG TABS tablet [id number] Yes Take 1 tablet (5 mg total) by mouth 2 (two) times daily. [name], MD Taking Active   atorvastatin (LIPITOR) 80 MG tablet [id number] Yes Take 1 tablet (80 mg total) by mouth daily. [name], MD Taking Active   Continuous Blood Gluc Receiver (FREESTYLE LIBRE 2 READER) [place] [id number]  Yes Use to check glucose at least TID [name], MD Taking Active   Continuous Blood Gluc Sensor (FREESTYLE LIBRE 2 SENSOR) [place] [id number] Yes Use to check glucose at least TID [name], MD Taking Active   Fludrocortisone Acetate (FLORINEF PO) [id number] Yes Take 0.1 mg by mouth daily. [provider] Taking Active   FLUoxetine (PROZAC) 10 MG tablet [id number] Yes TAKE 1 TABLET BY MOUTH EVERY DAY [name], MD Taking Active   furosemide (LASIX) 20 MG tablet [id number] Yes Take 20 mg  by mouth daily.  [provider] Taking Active Self  hydrocortisone (CORTEF) 10 MG tablet [id number] Yes Take 5 mg by mouth 2 (two) times daily. 10 mg QAM, 5 PM [provider] Taking Active Self  insulin aspart (NOVOLOG FLEXPEN) 100 UNIT/ML FlexPen [id number] Yes Inject 8 Units into the skin 3 (three) times daily with meals. [name], MD Taking Active   insulin degludec (TRESIBA FLEXTOUCH) 200 UNIT/ML FlexTouch Pen [id number] Yes Inject 18 units daily. Titrate as instructed. Max daily dose: 40 units [name], MD Taking Active   JARDIANCE 10 MG TABS tablet [id number] Yes Take 10 mg by mouth every morning. [provider] Taking Active   levothyroxine (SYNTHROID) 100 MCG tablet [id number] Yes Take 100 mcg by mouth daily. [provider] Taking Active   metoprolol succinate (TOPROL-XL) 25 MG 24 hr tablet [id number] Yes Take 25 mg by mouth daily. [provider] Taking Active   mirtazapine (REMERON) 7.5 MG tablet [id number] Yes TAKE 1 TABLET BY MOUTH EVERYDAY AT BEDTIME [name], MD Taking Active   Probiotic Product (PROBIOTIC ADVANCED PO) [id number] Yes Take 1 capsule by mouth daily. [provider] Taking Active   Semaglutide,0.25 or 0.5MG/DOS, (OZEMPIC, 0.25 OR 0.5 MG/DOSE,) 2 MG/1.5ML SOPN [id number] Yes Inject 0.5 mg into the skin once a week. [name], MD Taking Active   traZODone (DESYREL) 150 MG tablet [id number] Yes TAKE 1  TABLET (150 MG TOTAL) BY MOUTH AT BEDTIME. [name], MD Taking Active           Patient Active Problem List   Diagnosis Date Noted   Candida vaginitis 06/14/2020   Chronic left SI joint pain 05/01/2019   Transaminitis 05/01/2019   Diarrhea in adult patient 03/29/2019   Educated about COVID-19 virus infection 11/15/2018   Anemia 07/06/2018   Chronic atrial fibrillation ([place]) 05/25/2018   Neuropathic pain of hand 05/03/2018   History of cardiac arrest 05/03/2018   Cerebral infarction, watershed distribution, bilateral, remote, resolved 82/42/3536   Chronic systolic heart failure ([place]) 04/26/2018   Dysphagia, oropharyngeal 04/26/2018   Chronic constipation 04/26/2018   Adrenal insufficiency (Addison's disease) ([place]) 01/20/2018   Hospital discharge follow-up 01/20/2018   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   CHB (complete heart block) ([place]) 08/17/2017   Mobitz type 2 second degree AV block 08/09/2017   Decreased GFR 05/18/2017   Hyponatremia 11/01/2016   Drug-induced vitamin B12 deficiency anemia 04/16/2016   Type 2 diabetes mellitus with hyperglycemia, with long-term current use of insulin ([place]) 08/21/2015   Overweight (BMI 25.0-29.9) 10/07/2014   Depression with anxiety 08/08/2014   Encounter for preventive health examination 09/13/2013   Hyperlipidemia associated with type 2 diabetes mellitus ([place]) 07/25/2013   Insomnia 07/25/2013   Hypothyroidism 03/29/2012   Screening for breast cancer 03/29/2012   Screening for colon cancer 03/29/2012   Depression, recurrent ([place]) 08/13/2008   GERD 08/13/2008   Osteopenia 08/13/2008   COLONIC POLYPS, HX OF 08/13/2008    Immunization History  Administered Date(s) Administered   Fluad Quad(high Dose 65+) 06/12/2020   H1N1 08/13/2008   Influenza Split 07/28/2011, 06/28/2012   Influenza, High Dose Seasonal PF 04/14/2016, 07/10/2017, 04/11/2018, 05/07/2019    Influenza,inj,Quad PF,6+ Mos 07/25/2013, 08/21/2015   Influenza-Unspecified 07/22/2015   Moderna Sars-Covid-2 Vaccination 08/15/2019, 09/12/2019, 06/24/2020   Pneumococcal Conjugate-13 09/13/2013   Pneumococcal Polysaccharide-23 07/30/2011   Zoster 09/13/2013    Conditions to be addressed/monitored:  Hyperlipidemia, Diabetes, Atrial Fibrillation, Depression and Osteoporosis  Care Plan :  Medication Management  Updates made by De Hollingshead, RPH-CPP since 09/02/2020 12:00 AM    Problem: Diabetes, Addison's Disease, Atrial Fibrillation, Osteoporosis     Long-Range Goal: Disease Progression Prevention   Recent Progress: On track  Priority: High  Note:   Current Barriers:   Unable to independently monitor therapeutic efficacy  Unable to achieve control of diabetes   Complex patient with multiple comorbidities, including diabetes, Addison's disease, atrial fibrillation, hx pulmonic valve replacement, osteoporosis  Pharmacist Clinical Goal(s):   Over the next 90 days, patient will verbalize ability to afford treatment regimen.  Over the next 90 days, patient will achieve adherence to monitoring guidelines and medication adherence to achieve therapeutic efficacy.  Over the next 90 days, patient will achieve control of diabetes as evidenced by  improvement in A1c through collaboration with PharmD and provider.   Interventions:  1:1 collaboration with [name], MD regarding development and update of comprehensive plan of care as evidenced by provider attestation and co-signature  Inter-disciplinary care team collaboration (see longitudinal plan of care)  Comprehensive medication review performed; medication list updated in electronic medical record   Health Maintenance:  Patient notes that she reviewed her household income, and they are currently over income for patient assistance for [employer] ([place], [place], [place]), [employer] ([place]) or  [place] ([place])  Diabetes:  Uncontrolled but improving; current treatment: Ozempic 0.5 mg (x ~ 3 weeks), Tresiba 18 units QPM, Jardiance 10 mg daily, Novolog 8 units with breakfast and supper (generally not eating lunch) o Hx metformin, but discontinued by endocrinology at recent hospitalization d/t elevated lactic acid and risk of lactic acidosis  Denies any concerns with nausea, vomiting, stomach upset, diarrhea, or constipation w/ Ozempic dose increase. Confirms that she is clicking all the way up to 0.5 mg reading on pen. Reviewed that 1 pen will give 4 doses of 0.5 mg.   C-peptide WNL, indicating endogenous insulin production and likely benefit from GLP1  Current glucose readings:  Date of Download: 08/20/20-09/02/20 % Time CGM is active: 46% (<70% reduces ability to accurately interpret) Average Glucose: 206 mg/dL Glucose Management Indicator: unable to calculate  Glucose Variability: 33.8 (goal <36%) Time in Goal:  - Time in range 70-180: 43%  - Time above range: 57% - Time below range: 0% Observed patterns: Well controlled fastings, post prandial elevations.   Reviewed need to scan at least Q8H. Discussed scanning fasting, after meals, and before bed to capture all data throughout the day. Patient verbalized understanding  Reviewed need for improved post prandial coverage. Increase Novolog to 10 units with breakfast and supper (~25% dose increase). Continue Ozempic 0.5 mg weekly, Tresiba 18 units daily, Jardiance 10 mg daily.   Atrial Fibrillation, HTN, hx pulmonic valve replacement  Appropriately managed, last clinic BP at goal; current treatment: metoprolol succinate 25 mg daily, furosemide 20 mg daily  Anticoagulant regimen: Eliquis 5 mg BID  As noted above, over income for patient assistance.   Recent UACR does not indicate indication for ACEi/ARB tx at this time.  Recommended to continue current regimen at this time  Hyperlipidemia:  Uncontrolled per  last lipid panel, though anticipated to improve given recent dose increase; current treatment: atorvastatin 80 mg daily   Recommended to continue current regimen at this time . Due for repeat lipid panel, which is ordered.  Addison's Disease  Appropriately managed; current treatment; hydrocortisone 10 mg QAM, 5 mg QPM; fludrocortisone 0.1 mg daily  Recommend to continue current regimen at  this time with collaboration w/ PCP (patient declined continued endocrinology f/u).   Monitor for nausea/vomiting w/ GLP1 and need to consider stress dosing if develops.   Depression/anxiety/insomnia:  Appropriately managed per patient symptom report; current treatment: fluoxetine 10 mg daily, trazodone 150 mg QPM, mirtazapine 7.5 mg daily   Recommend to continue current regimen at this time. Continue to monitor for s/sx serotonin syndrome given high serotonergic burden. Monitor weight gain, appetite w/ mirtazapine - though use of GLP1 should help mitigate this.   Continue to discuss non-pharmacological methods to manage insomnia.  Fluoxetine may increase concentrations of metoprolol. Monitor BP/HR if fluoxetine dose ever increased.   Hypothyroidism:  Controlled per last lab result; levothyroxine 100 mcg daily  Continue current regimen at this time  Osteoporosis:  Untreated; Will discuss treatment, including appropriate calcium + vitamin D and falls risk reduction moving forward. Encouraged to discuss w/ PCP at next appointment. Consider bisphosphonate vs Prolia.   Patient Goals/Self-Care Activities  Over the next 30 days, patient will:  - take medications as prescribed check blood glucose TID using CGM, document, and provide at future appointments  Follow Up Plan: Telephone follow up appointment with care management team member scheduled for:~ 3 weeks       Medication Assistance: Over income for patient assistance  Patient's preferred pharmacy is:  [id number] [place] [place]Phone: [phone]: [phone]  Care Plan and Follow Up Patient Decision:  Patient agrees to Care Plan and Follow-up.  Plan: Telephone follow up appointment with care management team member scheduled for:  ~ 3 weeks  Catie TDarnelle Maffucci PharmD, BCataract CMonumentClinical Pharmacist LOccidental Petroleumat BJohnson & [phone]

## 2020-09-02 NOTE — Patient Instructions (Signed)
Ms. Lemere,   INCREASE Novolog to 10 units before each meal.   CONTINUE Ozempic 0.5 mg weekly, Jardiance 10 mg daily, and Tresiba 18 units daily.   Please scan AT LEAST three times daily, every 8 hours. This will help [place] see all of your glucose values throughout the day to aid in medication management.   Take care!  Catie Darnelle Maffucci, PharmD [phone]  Visit Information  PATIENT GOALS: Goals Addressed              This Visit's Progress     Patient Stated   .  Medication Management (pt-stated)        Patient Goals/Self-Care Activities . Over the next 30 days, patient will:  - take medications as prescribed check blood glucose TID using CGM, document, and provide at future appointments         Patient verbalizes understanding of instructions provided today and agrees to view in [place].   Plan: Telephone follow up appointment with care management team member scheduled for:  ~ 3 weeks  Catie Darnelle Maffucci, PharmD, [place], [place] Clinical Pharmacist [employer] at [employer] [phone]

## 2020-09-18 ENCOUNTER — Ambulatory Visit (INDEPENDENT_AMBULATORY_CARE_PROVIDER_SITE_OTHER): Payer: HMO | Admitting: Pharmacist

## 2020-09-18 DIAGNOSIS — F339 Major depressive disorder, recurrent, unspecified: Secondary | ICD-10-CM | POA: Diagnosis not present

## 2020-09-18 DIAGNOSIS — E1169 Type 2 diabetes mellitus with other specified complication: Secondary | ICD-10-CM | POA: Diagnosis not present

## 2020-09-18 DIAGNOSIS — E271 Primary adrenocortical insufficiency: Secondary | ICD-10-CM

## 2020-09-18 DIAGNOSIS — Z794 Long term (current) use of insulin: Secondary | ICD-10-CM

## 2020-09-18 DIAGNOSIS — I482 Chronic atrial fibrillation, unspecified: Secondary | ICD-10-CM | POA: Diagnosis not present

## 2020-09-18 DIAGNOSIS — E1165 Type 2 diabetes mellitus with hyperglycemia: Secondary | ICD-10-CM | POA: Diagnosis not present

## 2020-09-18 DIAGNOSIS — F5104 Psychophysiologic insomnia: Secondary | ICD-10-CM

## 2020-09-18 DIAGNOSIS — E785 Hyperlipidemia, unspecified: Secondary | ICD-10-CM

## 2020-09-18 MED ORDER — TRESIBA FLEXTOUCH 200 UNIT/ML ~~LOC~~ SOPN: PEN_INJECTOR | SUBCUTANEOUS | 2 refills | Status: DC

## 2020-09-18 MED ORDER — NOVOLOG FLEXPEN 100 UNIT/ML ~~LOC~~ SOPN: PEN_INJECTOR | SUBCUTANEOUS | 3 refills | Status: DC

## 2020-09-18 NOTE — Chronic Care Management (AMB) (Signed)
Chronic Care Management Pharmacy Note  09/18/2020 Name:  Haley Young MRN:  [id number] DOB:  08-15-1950  Subjective: Haley Young is an 70 y.o. year old female who is a primary patient of Derrel Nip, Aris Everts, MD.  The CCM team was consulted for assistance with disease management and care coordination needs.    Engaged with patient by telephone for follow up visit in response to provider referral for pharmacy case management and/or care coordination services.   Consent to Services:  The patient was given information about Chronic Care Management services, agreed to services, and gave verbal consent prior to initiation of services.  Please see initial visit note for detailed documentation.   Patient Care Team: [name], MD as PCP - General (Internal Medicine) De Hollingshead, RPH-CPP (Pharmacist)  Recent office visits: None since our last call  Recent consult visits: None since our last call  Hospital visits: None in previous 6 months  Objective:  Lab Results  Component Value Date   CREATININE 1.16 (H) 06/12/2020   BUN 24 06/12/2020   GFR 64.23 05/02/2019   GFRNONAA 47 (L) 03/20/2020   GFRAA 54 (L) 03/20/2020   NA 133 (L) 06/12/2020   K 5.0 06/12/2020   CALCIUM 10.2 06/12/2020   CO2 28 06/12/2020    Lab Results  Component Value Date/Time   HGBA1C 13.2 (H) 06/12/2020 04:25 PM   HGBA1C 7.8 (H) 05/02/2019 11:02 AM   HGBA1C 9.5 06/29/2018 12:00 AM   HGBA1C 9.3 (H) 07/26/2011 04:36 AM   FRUCTOSAMINE 282 (H) 05/11/2017 04:01 PM   FRUCTOSAMINE 318 (H) 09/26/2016 10:16 AM   GFR 64.23 05/02/2019 11:02 AM   GFR 69.83 11/27/2018 08:41 AM   MICROALBUR 0.5 06/12/2020 04:25 PM   MICROALBUR 3.0 (H) 05/02/2019 11:02 AM    Last diabetic Eye exam:  Lab Results  Component Value Date/Time   HMDIABEYEEXA No Retinopathy 06/26/2019 12:00 AM    Last diabetic Foot exam:  Lab Results  Component Value Date/Time   HMDIABFOOTEX Normal 09/13/2013 12:00 AM     Lab Results   Component Value Date   CHOL 182 06/12/2020   HDL 48 (L) 06/12/2020   LDLCALC 112 (H) 06/12/2020   LDLDIRECT 165.0 04/14/2016   TRIG 114 06/12/2020   CHOLHDL 3.8 06/12/2020    Hepatic Function Latest Ref Rng & Units 06/12/2020 03/20/2020 05/02/2019  Total Protein 6.1 - 8.1 g/dL 7.3 7.3 6.7  Albumin 3.5 - 5.0 g/dL - 4.0 3.9  AST 10 - 35 U/L 36(H) 33 36  ALT 6 - 29 U/L 32(H) 25 28  Alk Phosphatase 38 - 126 U/L - 85 102  Total Bilirubin 0.2 - 1.2 mg/dL 0.5 1.0 0.4  Bilirubin, Direct 0.0 - 0.2 mg/dL - 0.2 -    Lab Results  Component Value Date/Time   TSH 4.112 03/20/2020 01:32 PM   TSH 0.76 05/02/2019 11:02 AM   TSH 0.29 (A) 08/30/2018 12:00 AM   TSH 3.54 01/17/2018 11:45 AM   FREET4 1.71 (H) 01/25/2016 11:04 AM   FREET4 1.19 08/21/2015 10:49 AM    CBC Latest Ref Rng & Units 03/20/2020 08/30/2018 06/29/2018  WBC 4.0 - 10.5 K/uL 13.0(H) 17.0 -  Hemoglobin 12.0 - 15.0 g/dL 15.6(H) - 15.4  Hematocrit 36.0 - 46.0 % 48.9(H) - -  Platelets 150 - 400 K/uL 164 - -    Lab Results  Component Value Date/Time   VD25OH 30.95 04/14/2016 10:12 AM   VD25OH 44 09/13/2013 09:34 AM   VD25OH  29 (L) 12/08/2009 08:42 PM    Clinical ASCVD: Yes     Depression screen [facility] 2/9 07/27/2020 07/15/2020 07/15/2019  Decreased Interest 0 0 0  Down, Depressed, Hopeless 0 0 1  PHQ - 2 Score 0 0 1  Altered sleeping 1 - -  Tired, decreased energy 1 - -  Change in appetite 0 - -  Feeling bad or failure about yourself  0 - -  Trouble concentrating 0 - -  Moving slowly or fidgety/restless 0 - -  Suicidal thoughts 0 - -  PHQ-9 Score 2 - -  Difficult doing work/chores Somewhat difficult - -  Some recent data might be hidden     Social History   Tobacco Use  Smoking Status Never Smoker  Smokeless Tobacco Never Used   BP Readings from Last 3 Encounters:  06/12/20 102/64  03/20/20 (!) 143/65  03/20/20 (!) 82/58   Pulse Readings from Last 3 Encounters:  06/12/20 87  03/20/20 97  03/20/20 89   Wt  Readings from Last 3 Encounters:  07/27/20 142 lb (64.4 kg)  07/15/20 136 lb (61.7 kg)  06/12/20 136 lb 6.4 oz (61.9 kg)    Assessment/Interventions: Review of patient past medical history, allergies, medications, health status, including review of consultants reports, laboratory and other test data, was performed as part of comprehensive evaluation and provision of chronic care management services.   SDOH:  (Social Determinants of Health) assessments and interventions performed: Yes SDOH Interventions   Flowsheet Row Most Recent Value  SDOH Interventions   Financial Strain Interventions Intervention Not Indicated      CCM Care Plan  Allergies  Allergen Reactions  . Oxycodone Hcl     REACTION: hallucinations  . Albuterol Anxiety    Other reaction(s): Other (See Comments) Tremors   . Oxycodone Hcl Other (See Comments)    halucinations    Medications Reviewed Today    Reviewed by De Hollingshead, RPH-CPP (Pharmacist) on 09/18/20 at 1027  Med List Status: <None>  Medication Order Taking? Sig Documenting Provider Last Dose Status Informant  apixaban (ELIQUIS) 5 MG TABS tablet [id number]  Take 1 tablet (5 mg total) by mouth 2 (two) times daily. [name], MD  Active   atorvastatin (LIPITOR) 80 MG tablet [id number]  Take 1 tablet (80 mg total) by mouth daily. [name], MD  Active   Continuous Blood Gluc Receiver (FREESTYLE LIBRE 2 READER) [place] [id number] Yes Use to check glucose at least TID [name], MD Taking Active   Continuous Blood Gluc Sensor (FREESTYLE LIBRE 2 SENSOR) [place] [id number] Yes Use to check glucose at least TID [name], MD Taking Active   Fludrocortisone Acetate (FLORINEF PO) [id number]  Take 0.1 mg by mouth daily. [provider]  Active   FLUoxetine (PROZAC) 10 MG tablet [id number]  TAKE 1 TABLET BY MOUTH EVERY DAY [name], MD  Active   furosemide (LASIX) 20 MG tablet [id number]  Take 20 mg by mouth daily.  [provider]  Active Self  hydrocortisone (CORTEF) 10 MG tablet [id number]  Take 5 mg by mouth 2 (two) times daily. 10 mg QAM, 5 PM [provider]  Active Self  insulin aspart (NOVOLOG FLEXPEN) 100 UNIT/ML FlexPen [id number] Yes Inject 10 units up to three times daily with meals. [name], MD Taking Active   insulin degludec (TRESIBA FLEXTOUCH) 200 UNIT/ML FlexTouch Pen [id number] Yes Inject 18 units daily. Titrate as instructed. Max daily dose: 40 units  [name], MD Taking Active   JARDIANCE 10 MG TABS tablet [id number] Yes Take 10 mg by mouth every morning. [provider] Taking Active   levothyroxine (SYNTHROID) 100 MCG tablet [id number]  Take 100 mcg by mouth daily. [provider]  Active   metoprolol succinate (TOPROL-XL) 25 MG 24 hr tablet [id number]  Take 25 mg by mouth daily. [provider]  Active   mirtazapine (REMERON) 7.5 MG tablet [id number]  TAKE 1 TABLET BY MOUTH EVERYDAY AT BEDTIME [name], MD  Active   Probiotic Product (PROBIOTIC ADVANCED PO) [id number]  Take 1 capsule by mouth daily. [provider]  Active   Semaglutide,0.25 or 0.5MG/DOS, (OZEMPIC, 0.25 OR 0.5 MG/DOSE,) 2 MG/1.5ML SOPN [id number] Yes Inject 0.5 mg into the skin once a week. [name], MD Taking Active   traZODone (DESYREL) 150 MG tablet [id number]  TAKE 1 TABLET (150 MG TOTAL) BY MOUTH AT BEDTIME. [name], MD  Active           Patient Active Problem List   Diagnosis Date Noted  . Candida vaginitis 06/14/2020  . Chronic left SI joint pain 05/01/2019  . Transaminitis 05/01/2019  . Diarrhea in adult patient 03/29/2019  . Educated about COVID-19 virus infection 11/15/2018  . Anemia 07/06/2018  . Chronic atrial fibrillation ([place]) 05/25/2018  . Neuropathic pain of hand 05/03/2018  . History of cardiac arrest 05/03/2018  . Cerebral infarction, watershed distribution, bilateral, remote, resolved 05/03/2018  . Chronic systolic heart  failure ([place]) 04/26/2018  . Dysphagia, oropharyngeal 04/26/2018  . Chronic constipation 04/26/2018  . Adrenal insufficiency (Addison's disease) ([place]) 01/20/2018  . Hospital discharge follow-up 01/20/2018  . 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 CHB (complete heart block) ([place]) 08/17/2017  . Mobitz type 2 second degree AV block 08/09/2017  . Decreased GFR 05/18/2017  . Hyponatremia 11/01/2016  . Drug-induced vitamin B12 deficiency anemia 04/16/2016  . Type 2 diabetes mellitus with hyperglycemia, with long-term current use of insulin ([place]) 08/21/2015  . Overweight (BMI 25.0-29.9) 10/07/2014  . Depression with anxiety 08/08/2014  . Encounter for preventive health examination 09/13/2013  . Hyperlipidemia associated with type 2 diabetes mellitus ([place]) 07/25/2013  . Insomnia 07/25/2013  . Hypothyroidism 03/29/2012  . Screening for breast cancer 03/29/2012  . Screening for colon cancer 03/29/2012  . Depression, recurrent ([place]) 08/13/2008  . GERD 08/13/2008  . Osteopenia 08/13/2008  . COLONIC POLYPS, HX OF 08/13/2008    Immunization History  Administered Date(s) Administered  . Fluad Quad(high Dose 65+) 06/12/2020  . H1N1 08/13/2008  . Influenza Split 07/28/2011, 06/28/2012  . Influenza, High Dose Seasonal PF 04/14/2016, 07/10/2017, 04/11/2018, 05/07/2019  . Influenza,inj,Quad PF,6+ Mos 07/25/2013, 08/21/2015  . Influenza-Unspecified 07/22/2015  . Moderna Sars-Covid-2 Vaccination 08/15/2019, 09/12/2019, 06/24/2020  . Pneumococcal Conjugate-13 09/13/2013  . Pneumococcal Polysaccharide-23 07/30/2011  . Zoster 09/13/2013    Conditions to be addressed/monitored:  Hypertension, Hyperlipidemia, Diabetes and Atrial Fibrillation  Care Plan : Medication Management  Updates made by De Hollingshead, RPH-CPP since 09/18/2020 12:00 AM    Problem: Diabetes, Addison's Disease, Atrial Fibrillation, Osteoporosis     Long-Range Goal: Disease Progression Prevention    This Visit's Progress: On track  Recent Progress: On track  Priority: High  Note:   Current Barriers:  . Unable to independently monitor therapeutic efficacy . Unable to achieve control of diabetes  . Complex patient with multiple comorbidities, including diabetes, Addison's disease, atrial fibrillation, hx pulmonic valve replacement, osteoporosis  Pharmacist Clinical  Goal(s):  Marland Kitchen Over the next 90 days, patient will verbalize ability to afford treatment regimen. . Over the next 90 days, patient will achieve adherence to monitoring guidelines and medication adherence to achieve therapeutic efficacy. . Over the next 90 days, patient will achieve control of diabetes as evidenced by  improvement in A1c through collaboration with PharmD and provider.   Interventions: . 1:1 collaboration with [name], MD regarding development and update of comprehensive plan of care as evidenced by provider attestation and co-signature . Inter-disciplinary care team collaboration (see longitudinal plan of care) . Comprehensive medication review performed; medication list updated in electronic medical record   Health Maintenance: . Over income for patient assistance for [employer] ([place], [place], [place]), [employer] ([place]) or [place] (E[place]is)  Diabetes: . Uncontrolled but improving; current treatment: Ozempic 0.5 mg, Tresiba 18 units QPM, Jardiance 10 mg daily, Novolog 10 units with breakfast and supper (generally not eating lunch) o Hx metformin, but discontinued by endocrinology at recent hospitalization d/t elevated lactic acid and risk of lactic acidosis . Denies any GI upset concerns. Denies missed doses. Confirms taking Novolog 15-20 minutes prior to meals.   . C-peptide WNL, indicating endogenous insulin production and likely benefit from GLP1 . Current glucose readings:  Date of Download: 2/26-3/11/11 % Time CGM is active: 56% (<70% reduces ability to  accurately interpret) Average Glucose: 216 mg/dL Glucose Management Indicator: 8.5% Glucose Variability: 31.7 (goal <36%) Time in Goal:  - Time in range 70-180: 36%  - Time above range: 64% - Time below range: 0% Observed patterns: [place] within target range. Elevations significant post meals. Likely impacted by Addision's Tx. . Reviewed CGM data with patient. Patient has ~ 6 months of Ozempic 0.5 mg pen strength at home. Continue Ozempic 0.5 mg weekly, Jardiance 10 mg daily. Increase Tresiba to 22 units daily (22% increase), Novolog to 14 units with breakfast and supper (40% increase- as more conservative increases have not demonstrated impact on glycemic control) . Requested nutrition referral. Will collaborate w/ PCP on this  Atrial Fibrillation, HTN, hx pulmonic valve replacement . Appropriately managed, last clinic BP at goal; current treatment: metoprolol succinate 25 mg daily, furosemide 20 mg daily . Anticoagulant regimen: Eliquis 5 mg BID . As noted above, over income for patient assistance.  . Recent UACR does not indicate indication for ACEi/ARB tx at this time. . Recommended to continue current regimen at this time  Hyperlipidemia: . Uncontrolled per last lipid panel, though anticipated to improve given recent dose increase; current treatment: atorvastatin 80 mg daily  . Recommended to continue current regimen at this time . Due for repeat lipid panel, which is ordered.  Addison's Disease . Appropriately managed; current treatment; hydrocortisone 10 mg QAM, 5 mg QPM; fludrocortisone 0.1 mg daily . Discussed impact of these medications on glycemic control. Emotional stress also playing a factor. . Recommend to continue current regimen at this time with collaboration w/ PCP (patient declined continued endocrinology f/u).  . Monitor for nausea/vomiting w/ GLP1 and need to consider stress dosing if develops.   Depression/anxiety/insomnia: . Appropriately managed per patient  symptom report; current treatment: fluoxetine 10 mg daily, trazodone 150 mg QPM, mirtazapine 7.5 mg daily  . Recommend to continue current regimen at this time. Continue to monitor for s/sx serotonin syndrome given high serotonergic burden. Monitor weight gain, appetite w/ mirtazapine - though use of GLP1 should help mitigate this.  . Continue to discuss non-pharmacological methods to manage insomnia. . Fluoxetine may  increase concentrations of metoprolol. Monitor BP/HR if fluoxetine dose ever increased.   Hypothyroidism: . Controlled per last lab result; levothyroxine 100 mcg daily . Previously recommended to continue current regimen at this time  Osteoporosis: . Untreated; Will discuss treatment, including appropriate calcium + vitamin D and falls risk reduction moving forward. Encouraged to discuss w/ PCP at next appointment. Consider bisphosphonate vs Prolia.   Patient Goals/Self-Care Activities . Over the next 30 days, patient will:  - take medications as prescribed check blood glucose at least three times daily using CGM, document, and provide at future appointments  Follow Up Plan: Telephone follow up appointment with care management team member scheduled for:~ 3 weeks       Medication Assistance: None required.  Patient affirms current coverage meets needs. Over income for patient assistance .  Patient's preferred pharmacy is:  [id number] [place]Phone: [phone]: [phone] Care Plan and Follow Up Patient Decision:  Patient agrees to Care Plan and Follow-up.  Plan: Telephone follow up appointment with care management team member scheduled for:  ~ 3 weeks  Catie TDarnelle Maffucci PharmD, BIsh[place] CWinstonClinical Pharmacist [facility] [employer] [phone]

## 2020-09-18 NOTE — Patient Instructions (Signed)
Visit Information  PATIENT GOALS: Goals Addressed              This Visit's Progress     Patient Stated   .  Medication Management (pt-stated)        Patient Goals/Self-Care Activities . Over the next 30 days, patient will:  - take medications as prescribed check blood glucose three times daily using CGM, document, and provide at future appointments         The patient verbalized understanding of instructions, educational materials, and care plan provided today and declined offer to receive copy of patient instructions, educational materials, and care plan.   Plan: Telephone follow up appointment with care management team member scheduled for:  ~ 3 weeks  Catie Darnelle Maffucci, PharmD, [place], [place] Clinical Pharmacist [employer] at [employer] [phone]

## 2020-09-22 DIAGNOSIS — L603 Nail dystrophy: Secondary | ICD-10-CM | POA: Diagnosis not present

## 2020-09-22 DIAGNOSIS — E1142 Type 2 diabetes mellitus with diabetic polyneuropathy: Secondary | ICD-10-CM | POA: Diagnosis not present

## 2020-09-28 ENCOUNTER — Other Ambulatory Visit: Payer: Self-pay | Admitting: Internal Medicine

## 2020-09-29 DIAGNOSIS — Z950 Presence of cardiac pacemaker: Secondary | ICD-10-CM | POA: Diagnosis not present

## 2020-09-29 DIAGNOSIS — I469 Cardiac arrest, cause unspecified: Secondary | ICD-10-CM | POA: Diagnosis not present

## 2020-09-29 DIAGNOSIS — Z45018 Encounter for adjustment and management of other part of cardiac pacemaker: Secondary | ICD-10-CM | POA: Diagnosis not present

## 2020-09-29 DIAGNOSIS — I442 Atrioventricular block, complete: Secondary | ICD-10-CM | POA: Diagnosis not present

## 2020-10-08 ENCOUNTER — Ambulatory Visit: Payer: HMO | Admitting: Pharmacist

## 2020-10-08 DIAGNOSIS — F339 Major depressive disorder, recurrent, unspecified: Secondary | ICD-10-CM

## 2020-10-08 DIAGNOSIS — E1169 Type 2 diabetes mellitus with other specified complication: Secondary | ICD-10-CM

## 2020-10-08 DIAGNOSIS — I482 Chronic atrial fibrillation, unspecified: Secondary | ICD-10-CM | POA: Diagnosis not present

## 2020-10-08 DIAGNOSIS — E1165 Type 2 diabetes mellitus with hyperglycemia: Secondary | ICD-10-CM | POA: Diagnosis not present

## 2020-10-08 DIAGNOSIS — E271 Primary adrenocortical insufficiency: Secondary | ICD-10-CM

## 2020-10-08 DIAGNOSIS — Z794 Long term (current) use of insulin: Secondary | ICD-10-CM | POA: Diagnosis not present

## 2020-10-08 DIAGNOSIS — E785 Hyperlipidemia, unspecified: Secondary | ICD-10-CM | POA: Diagnosis not present

## 2020-10-08 MED ORDER — TRESIBA FLEXTOUCH 200 UNIT/ML ~~LOC~~ SOPN: PEN_INJECTOR | SUBCUTANEOUS | 2 refills | Status: DC

## 2020-10-08 MED ORDER — NOVOLOG FLEXPEN 100 UNIT/ML ~~LOC~~ SOPN: PEN_INJECTOR | SUBCUTANEOUS | 3 refills | Status: DC

## 2020-10-08 NOTE — Chronic Care Management (AMB) (Signed)
Chronic Care Management Pharmacy Note  10/08/2020 Name:  Haley Young MRN:  [id number] DOB:  April 06, 1951  Subjective: Haley Young is an 70 y.o. year old female who is a primary patient of Derrel Nip, Aris Everts, MD.  The CCM team was consulted for assistance with disease management and care coordination needs.    Engaged with patient by telephone for follow up visit in response to provider referral for pharmacy case management and/or care coordination services.   Consent to Services:  The patient was given information about Chronic Care Management services, agreed to services, and gave verbal consent prior to initiation of services.  Please see initial visit note for detailed documentation.   Patient Care Team: [name], MD as PCP - General (Internal Medicine) De Hollingshead, RPH-CPP (Pharmacist)  Recent office visits: None since our last call  Recent consult visits: None since our last call  Hospital visits: None in previous 6 months  Objective:  Lab Results  Component Value Date   CREATININE 1.16 (H) 06/12/2020   CREATININE 1.17 (H) 03/20/2020   CREATININE 0.87 05/02/2019    Lab Results  Component Value Date   HGBA1C 13.2 (H) 06/12/2020   Last diabetic Eye exam:  Lab Results  Component Value Date/Time   HMDIABEYEEXA No Retinopathy 06/26/2019 12:00 AM    Last diabetic Foot exam:  Lab Results  Component Value Date/Time   HMDIABFOOTEX Normal 09/13/2013 12:00 AM        Component Value Date/Time   CHOL 182 06/12/2020 1625   TRIG 114 06/12/2020 1625   HDL 48 (L) 06/12/2020 1625   CHOLHDL 3.8 06/12/2020 1625   VLDL 18.0 05/02/2019 1102   LDLCALC 112 (H) 06/12/2020 1625   LDLDIRECT 165.0 04/14/2016 1012    Hepatic Function Latest Ref Rng & Units 06/12/2020 03/20/2020 05/02/2019  Total Protein 6.1 - 8.1 g/dL 7.3 7.3 6.7  Albumin 3.5 - 5.0 g/dL - 4.0 3.9  AST 10 - 35 U/L 36(H) 33 36  ALT 6 - 29 U/L 32(H) 25 28  Alk Phosphatase 38 - 126 U/L - 85 102   Total Bilirubin 0.2 - 1.2 mg/dL 0.5 1.0 0.4  Bilirubin, Direct 0.0 - 0.2 mg/dL - 0.2 -    Lab Results  Component Value Date/Time   TSH 4.112 03/20/2020 01:32 PM   TSH 0.76 05/02/2019 11:02 AM   TSH 0.29 (A) 08/30/2018 12:00 AM   TSH 3.54 01/17/2018 11:45 AM   FREET4 1.71 (H) 01/25/2016 11:04 AM   FREET4 1.19 08/21/2015 10:49 AM    CBC Latest Ref Rng & Units 03/20/2020 08/30/2018 06/29/2018  WBC 4.0 - 10.5 K/uL 13.0(H) 17.0 -  Hemoglobin 12.0 - 15.0 g/dL 15.6(H) - 15.4  Hematocrit 36.0 - 46.0 % 48.9(H) - -  Platelets 150 - 400 K/uL 164 - -    Lab Results  Component Value Date/Time   VD25OH 30.95 04/14/2016 10:12 AM   VD25OH 44 09/13/2013 09:34 AM   VD25OH 29 (L) 12/08/2009 08:42 PM    Clinical ASCVD: Yes  The ASCVD Risk score Mikey Bussing DC Jr., et al., 2013) failed to calculate for the following reasons:   The patient has a prior MI or stroke diagnosis     Social History   Tobacco Use  Smoking Status Never Smoker  Smokeless Tobacco Never Used   BP Readings from Last 3 Encounters:  06/12/20 102/64  03/20/20 (!) 143/65  03/20/20 (!) 82/58   Pulse Readings from Last 3 Encounters:  06/12/20 87  03/20/20 97  03/20/20 89   Wt Readings from Last 3 Encounters:  07/27/20 142 lb (64.4 kg)  07/15/20 136 lb (61.7 kg)  06/12/20 136 lb 6.4 oz (61.9 kg)      Assessment: Review of patient past medical history, allergies, medications, health status, including review of consultants reports, laboratory and other test data, was performed as part of comprehensive evaluation and provision of chronic care management services.   SDOH:  (Social Determinants of Health) assessments and interventions performed:  SDOH Interventions   Flowsheet Row Most Recent Value  SDOH Interventions   Financial Strain Interventions Intervention Not Indicated      CCM Care Plan  Allergies  Allergen Reactions  . Oxycodone Hcl     REACTION: hallucinations  . Albuterol Anxiety    Other  reaction(s): Other (See Comments) Tremors   . Oxycodone Hcl Other (See Comments)    halucinations    Medications Reviewed Today    Reviewed by De Hollingshead, RPH-CPP (Pharmacist) on 10/08/20 at [phone]  Med List Status: <None>  Medication Order Taking? Sig Documenting Provider Last Dose Status Informant  apixaban (ELIQUIS) 5 MG TABS tablet [id number] Yes Take 1 tablet (5 mg total) by mouth 2 (two) times daily. [name], MD Taking Active   atorvastatin (LIPITOR) 80 MG tablet [id number] Yes TAKE 1 TABLET BY MOUTH EVERY DAY [name], MD Taking Active   Continuous Blood Gluc Receiver (FREESTYLE LIBRE 2 READER) [place] [id number]  Use to check glucose at least TID [name], MD  Active   Continuous Blood Gluc Sensor (FREESTYLE LIBRE 2 SENSOR) [place] [id number]  Use to check glucose at least TID [name], MD  Active   Fludrocortisone Acetate (FLORINEF PO) [id number] Yes Take 0.1 mg by mouth daily. [provider] Taking Active   FLUoxetine (PROZAC) 10 MG tablet [id number] Yes TAKE 1 TABLET BY MOUTH EVERY DAY [name], MD Taking Active   furosemide (LASIX) 20 MG tablet [id number] Yes Take 20 mg by mouth daily.  [provider] Taking Active Self  hydrocortisone (CORTEF) 10 MG tablet [id number] Yes Take 5 mg by mouth 2 (two) times daily. 10 mg QAM, 5 PM [provider] Taking Active Self  insulin aspart (NOVOLOG FLEXPEN) 100 UNIT/ML FlexPen [id number] Yes Inject 14 units up to three times daily with meals. [name], MD Taking Active            Med Note Nat Christen Oct 08, 2020  8:47 AM) Taking 10 units with meals  insulin degludec (TRESIBA FLEXTOUCH) 200 UNIT/ML FlexTouch Pen [id number] Yes Inject 24 units daily. Titrate as instructed. Max daily dose: 40 units [name], MD Taking Active            Med Note Nat Christen Oct 08, 2020  8:45 AM) 22  JARDIANCE 10 MG TABS tablet [id number] Yes Take 10 mg by mouth  every morning. [provider] Taking Active   levothyroxine (SYNTHROID) 100 MCG tablet [id number] Yes Take 100 mcg by mouth daily. [provider] Taking Active   metoprolol succinate (TOPROL-XL) 25 MG 24 hr tablet [id number] Yes Take 25 mg by mouth daily. [provider] Taking Active   mirtazapine (REMERON) 7.5 MG tablet [id number] Yes TAKE 1 TABLET BY MOUTH EVERYDAY AT BEDTIME [name], MD Taking Active   Probiotic Product (PROBIOTIC ADVANCED PO) [id number] Yes Take 1 capsule by mouth daily. [provider] Taking  Active   Semaglutide,0.25 or 0.5MG/DOS, (OZEMPIC, 0.25 OR 0.5 MG/DOSE,) 2 MG/1.5ML SOPN [id number] Yes Inject 0.5 mg into the skin once a week. [name], MD Taking Active   traZODone (DESYREL) 150 MG tablet [id number] Yes TAKE 1 TABLET (150 MG TOTAL) BY MOUTH AT BEDTIME. [name], MD Taking Active           Patient Active Problem List   Diagnosis Date Noted  . Candida vaginitis 06/14/2020  . Chronic left SI joint pain 05/01/2019  . Transaminitis 05/01/2019  . Diarrhea in adult patient 03/29/2019  . Educated about COVID-19 virus infection 11/15/2018  . Anemia 07/06/2018  . Chronic atrial fibrillation ([place]) 05/25/2018  . Neuropathic pain of hand 05/03/2018  . History of cardiac arrest 05/03/2018  . Cerebral infarction, watershed distribution, bilateral, remote, resolved 05/03/2018  . Chronic systolic heart failure ([place]) 04/26/2018  . Dysphagia, oropharyngeal 04/26/2018  . Chronic constipation 04/26/2018  . Adrenal insufficiency (Addison's disease) ([place]) 01/20/2018  . Hospital discharge follow-up 01/20/2018  . 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 CHB (complete heart block) ([place]) 08/17/2017  . Mobitz type 2 second degree AV block 08/09/2017  . Decreased GFR 05/18/2017  . Hyponatremia 11/01/2016  . Drug-induced vitamin B12 deficiency anemia 04/16/2016  . Type 2 diabetes mellitus with hyperglycemia,  with long-term current use of insulin ([place]) 08/21/2015  . Overweight (BMI 25.0-29.9) 10/07/2014  . Depression with anxiety 08/08/2014  . Encounter for preventive health examination 09/13/2013  . Hyperlipidemia associated with type 2 diabetes mellitus ([place]) 07/25/2013  . Insomnia 07/25/2013  . Hypothyroidism 03/29/2012  . Screening for breast cancer 03/29/2012  . Screening for colon cancer 03/29/2012  . Depression, recurrent ([place]) 08/13/2008  . GERD 08/13/2008  . Osteopenia 08/13/2008  . COLONIC POLYPS, HX OF 08/13/2008    Immunization History  Administered Date(s) Administered  . Fluad Quad(high Dose 65+) 06/12/2020  . H1N1 08/13/2008  . Influenza Split 07/28/2011, 06/28/2012  . Influenza, High Dose Seasonal PF 04/14/2016, 07/10/2017, 04/11/2018, 05/07/2019  . Influenza,inj,Quad PF,6+ Mos 07/25/2013, 08/21/2015  . Influenza-Unspecified 07/22/2015  . Moderna Sars-Covid-2 Vaccination 08/15/2019, 09/12/2019, 06/24/2020  . Pneumococcal Conjugate-13 09/13/2013  . Pneumococcal Polysaccharide-23 07/30/2011  . Zoster 09/13/2013    Conditions to be addressed/monitored: Atrial Fibrillation, HTN, HLD and DMII  Care Plan : Medication Management  Updates made by De Hollingshead, RPH-CPP since 10/08/2020 12:00 AM    Problem: Diabetes, Addison's Disease, Atrial Fibrillation, Osteoporosis     Long-Range Goal: Disease Progression Prevention   This Visit's Progress: On track  Recent Progress: On track  Priority: High  Note:   Current Barriers:  . Unable to independently monitor therapeutic efficacy . Unable to achieve control of diabetes  . Complex patient with multiple comorbidities, including diabetes, Addison's disease, atrial fibrillation, hx pulmonic valve replacement, osteoporosis  Pharmacist Clinical Goal(s):  Marland Kitchen Over the next 90 days, patient will verbalize ability to afford treatment regimen. . Over the next 90 days, patient will achieve adherence to monitoring guidelines  and medication adherence to achieve therapeutic efficacy. . Over the next 90 days, patient will achieve control of diabetes as evidenced by  improvement in A1c through collaboration with PharmD and provider.   Interventions: . 1:1 collaboration with [name], MD regarding development and update of comprehensive plan of care as evidenced by provider attestation and co-signature . Inter-disciplinary care team collaboration (see longitudinal plan of care) . Comprehensive medication review performed; medication list updated in electronic medical record  SDOH .  Over income for patient assistance for [employer] ([place], [place], [place]), [employer] ([place]) or [place] ([place])  Health Maintenance: . Up to date on COVID vaccinations  Diabetes: . Uncontrolled; current treatment: Ozempic 0.5 mg, Tresiba 22 units QPM, Jardiance 10 mg daily, Novolog 10 units with breakfast and supper (generally not eating lunch) - did not remember to increase to 14 as we discussed at last appointment o Hx metformin, but discontinued by endocrinology at recent hospitalization d/t elevated lactic acid and risk of lactic acidosis . C-peptide WNL, indicating endogenous insulin production and likely benefit from GLP1 . Current glucose readings:  Date of Download: 3/18-3/31/22 % Time CGM is active: 57% (<70% reduces ability to accurately interpret) Average Glucose: 244 mg/dL Glucose Management Indicator: 9.1% Glucose Variability: 32.9 (goal <36%) Time in Goal:  - Time in range 70-180: 25%  - Time above range: 75% - Time below range: 0% Observed patterns: [place] within target range. Elevations significant post meals. Likely impacted by Addision's Tx. . Reviewed CGM data with patient. Concern for continued confusion about injection technique given lack of improvement with subsequent dose changes. Scheduled follow up face to face visit in ~ 2 weeks, patient instructed to bring pens  with her.  . For now, increase Tresiba to 26 units daily, increase Novolog to 16 units with meals. Continue Ozempic 0.5 mg weekly (has significant supply at home) and Jardiance 10 mg daily   Atrial Fibrillation, HTN, hx pulmonic valve replacement . Appropriately managed, last clinic BP at goal; current treatment: metoprolol succinate 25 mg daily, furosemide 20 mg daily . Anticoagulant regimen: Eliquis 5 mg BID . As noted above, over income for patient assistance.  . Recommended to continue current regimen at this time  Hyperlipidemia: . Uncontrolled per last lipid panel, though anticipated to improve given recent dose increase; current treatment: atorvastatin 80 mg daily  . Recommended to continue current regimen at this time . Due for repeat lipid panel, which is ordered.  Addison's Disease . Appropriately managed; current treatment; hydrocortisone 10 mg QAM, 5 mg QPM; fludrocortisone 0.1 mg daily . Recommend to continue current regimen at this time with collaboration w/ PCP (patient declined continued endocrinology f/u).  . Monitor for nausea/vomiting w/ GLP1 and need to consider stress dosing if develops.   Depression/anxiety/insomnia: . Appropriately managed per patient symptom report; current treatment: fluoxetine 10 mg daily, trazodone 150 mg QPM, mirtazapine 7.5 mg daily  . Recommend to continue current regimen at this time. Continue to monitor for s/sx serotonin syndrome given high serotonergic burden. Monitor weight gain, appetite w/ mirtazapine.   Hypothyroidism: . Controlled per last lab result; levothyroxine 100 mcg daily . Previously recommended to continue current regimen at this time  Osteoporosis: . Untreated; Will discuss treatment, including appropriate calcium + vitamin D and falls risk reduction moving forward. Encouraged to discuss w/ PCP at next appointment. Consider bisphosphonate vs Prolia.   Patient Goals/Self-Care Activities . Over the next 30 days, patient  will:  - take medications as prescribed check blood glucose at least three times daily using CGM, document, and provide at future appointments  Follow Up Plan: Face to Face appointment with care management team member scheduled for: ~ 2 weeks      Medication Assistance: None required.  Patient affirms current coverage meets needs.  Patient's preferred pharmacy is:  [id number] [place], [id number]: [phone]: [phone] Uses pill box? Yes Pt endorses 90% compliance  Follow Up:  Patient agrees to Care Plan and Follow-up.  Plan: Face to Face appointment with care management team member scheduled for: ~ 2 weeks  Catie Darnelle Maffucci, PharmD, [place], [place] Clinical Pharmacist [employer] [facility] [employer] [phone]

## 2020-10-08 NOTE — Patient Instructions (Signed)
Visit Information  PATIENT GOALS: Goals Addressed              This Visit's Progress     Patient Stated   .  Medication Management (pt-stated)        Patient Goals/Self-Care Activities . Over the next 30 days, patient will:  - take medications as prescribed check blood glucose three times daily using CGM, document, and provide at future appointments       Patient verbalizes understanding of instructions provided today and agrees to view in [place].   Plan: Face to Face appointment with care management team member scheduled for: ~ 2 weeks  Catie Darnelle Maffucci, PharmD, [place], [place] Clinical Pharmacist [employer] [facility] [employer] [phone]

## 2020-10-09 ENCOUNTER — Telehealth: Payer: HMO

## 2020-10-13 ENCOUNTER — Telehealth: Payer: HMO

## 2020-10-20 ENCOUNTER — Ambulatory Visit (INDEPENDENT_AMBULATORY_CARE_PROVIDER_SITE_OTHER): Payer: HMO | Admitting: Pharmacist

## 2020-10-20 ENCOUNTER — Other Ambulatory Visit: Payer: Self-pay

## 2020-10-20 DIAGNOSIS — E785 Hyperlipidemia, unspecified: Secondary | ICD-10-CM | POA: Diagnosis not present

## 2020-10-20 DIAGNOSIS — Z794 Long term (current) use of insulin: Secondary | ICD-10-CM | POA: Diagnosis not present

## 2020-10-20 DIAGNOSIS — I482 Chronic atrial fibrillation, unspecified: Secondary | ICD-10-CM

## 2020-10-20 DIAGNOSIS — E1165 Type 2 diabetes mellitus with hyperglycemia: Secondary | ICD-10-CM | POA: Diagnosis not present

## 2020-10-20 DIAGNOSIS — E1169 Type 2 diabetes mellitus with other specified complication: Secondary | ICD-10-CM

## 2020-10-20 DIAGNOSIS — F339 Major depressive disorder, recurrent, unspecified: Secondary | ICD-10-CM | POA: Diagnosis not present

## 2020-10-20 DIAGNOSIS — E271 Primary adrenocortical insufficiency: Secondary | ICD-10-CM

## 2020-10-20 DIAGNOSIS — I5022 Chronic systolic (congestive) heart failure: Secondary | ICD-10-CM | POA: Diagnosis not present

## 2020-10-20 MED ORDER — TRESIBA FLEXTOUCH 200 UNIT/ML ~~LOC~~ SOPN: PEN_INJECTOR | SUBCUTANEOUS | 2 refills | Status: DC

## 2020-10-20 MED ORDER — EMPAGLIFLOZIN 25 MG PO TABS: 25.0000 mg | ORAL_TABLET | Freq: Every day | ORAL | 3 refills | Status: DC

## 2020-10-20 MED ORDER — OZEMPIC (1 MG/DOSE) 4 MG/3ML ~~LOC~~ SOPN: 1.0000 mg | PEN_INJECTOR | SUBCUTANEOUS | 1 refills | Status: DC

## 2020-10-20 NOTE — Chronic Care Management (AMB) (Signed)
Chronic Care Management Pharmacy Note  10/20/2020 Name:  Haley Young MRN:  [id number] DOB:  [birth year]  Subjective: Haley Young is an 70 y.o. year old female who is a primary patient of Haley Young, Haley Everts, MD.  The CCM team was consulted for assistance with disease management and care coordination needs.    Engaged with patient face to face for follow up visit in response to provider referral for pharmacy case management and/or care coordination services.   Consent to Services:  The patient was given information about Chronic Care Management services, agreed to services, and gave verbal consent prior to initiation of services.  Please see initial visit note for detailed documentation.   Patient Care Team: [name], MD as PCP - General (Internal Medicine) De Hollingshead, RPH-CPP (Pharmacist)  Recent office visits: None since our last visit  Recent consult visits: None since our last visit  Hospital visits: None in previous 6 months  Objective:  Lab Results  Component Value Date   CREATININE 1.16 (H) 06/12/2020   CREATININE 1.17 (H) 03/20/2020   CREATININE 0.87 05/02/2019    Lab Results  Component Value Date   HGBA1C 13.2 (H) 06/12/2020   Last diabetic Eye exam:  Lab Results  Component Value Date/Time   HMDIABEYEEXA No Retinopathy 06/26/2019 12:00 AM    Last diabetic Foot exam:  Lab Results  Component Value Date/Time   HMDIABFOOTEX Normal 09/13/2013 12:00 AM        Component Value Date/Time   CHOL 182 06/12/2020 1625   TRIG 114 06/12/2020 1625   HDL 48 (L) 06/12/2020 1625   CHOLHDL 3.8 06/12/2020 1625   VLDL 18.0 05/02/2019 1102   LDLCALC 112 (H) 06/12/2020 1625   LDLDIRECT 165.0 04/14/2016 1012    Hepatic Function Latest Ref Rng & Units 06/12/2020 03/20/2020 05/02/2019  Total Protein 6.1 - 8.1 g/dL 7.3 7.3 6.7  Albumin 3.5 - 5.0 g/dL - 4.0 3.9  AST 10 - 35 U/L 36(H) 33 36  ALT 6 - 29 U/L 32(H) 25 28  Alk Phosphatase 38 - 126 U/L - 85 102   Total Bilirubin 0.2 - 1.2 mg/dL 0.5 1.0 0.4  Bilirubin, Direct 0.0 - 0.2 mg/dL - 0.2 -    Lab Results  Component Value Date/Time   TSH 4.112 03/20/2020 01:32 PM   TSH 0.76 05/02/2019 11:02 AM   TSH 0.29 (A) 08/30/2018 12:00 AM   TSH 3.54 01/17/2018 11:45 AM   FREET4 1.71 (H) 01/25/2016 11:04 AM   FREET4 1.19 08/21/2015 10:49 AM    CBC Latest Ref Rng & Units 03/20/2020 08/30/2018 06/29/2018  WBC 4.0 - 10.5 K/uL 13.0(H) 17.0 -  Hemoglobin 12.0 - 15.0 g/dL 15.6(H) - 15.4  Hematocrit 36.0 - 46.0 % 48.9(H) - -  Platelets 150 - 400 K/uL 164 - -    Lab Results  Component Value Date/Time   VD25OH 30.95 04/14/2016 10:12 AM   VD25OH 44 09/13/2013 09:34 AM   VD25OH 29 (L) 12/08/2009 08:42 PM    Clinical ASCVD: Yes  The ASCVD Risk score Mikey Bussing DC Jr., et al., 2013) failed to calculate for the following reasons:   The patient has a prior MI or stroke diagnosis      Social History   Tobacco Use  Smoking Status Never Smoker  Smokeless Tobacco Never Used   BP Readings from Last 3 Encounters:  06/12/20 102/64  03/20/20 (!) 143/65  03/20/20 (!) 82/58   Pulse Readings from Last 3 Encounters:  06/12/20 87  03/20/20 97  03/20/20 89   Wt Readings from Last 3 Encounters:  07/27/20 142 lb (64.4 kg)  07/15/20 136 lb (61.7 kg)  06/12/20 136 lb 6.4 oz (61.9 kg)    Assessment: Review of patient past medical history, allergies, medications, health status, including review of consultants reports, laboratory and other test data, was performed as part of comprehensive evaluation and provision of chronic care management services.   SDOH:  (Social Determinants of Health) assessments and interventions performed:  SDOH Interventions   Flowsheet Row Most Recent Value  SDOH Interventions   Financial Strain Interventions Intervention Not Indicated      CCM Care Plan  Allergies  Allergen Reactions  . Oxycodone Hcl     REACTION: hallucinations  . Albuterol Anxiety    Other reaction(s):  Other (See Comments) Tremors   . Oxycodone Hcl Other (See Comments)    halucinations    Medications Reviewed Today    Reviewed by De Hollingshead, RPH-CPP (Pharmacist) on 10/20/20 at [phone]  Med List Status: <None>  Medication Order Taking? Sig Documenting Provider Last Dose Status Informant  apixaban (ELIQUIS) 5 MG TABS tablet [id number] Yes Take 1 tablet (5 mg total) by mouth 2 (two) times daily. [name], MD Taking Active   atorvastatin (LIPITOR) 80 MG tablet [id number] Yes TAKE 1 TABLET BY MOUTH EVERY DAY [name], MD Taking Active   Continuous Blood Gluc Receiver (FREESTYLE LIBRE 2 READER) [place] [id number] Yes Use to check glucose at least TID [name], MD Taking Active   Continuous Blood Gluc Sensor (FREESTYLE LIBRE 2 SENSOR) [place] [id number] Yes Use to check glucose at least TID [name], MD Taking Active   empagliflozin (JARDIANCE) 25 MG TABS tablet [id number] Yes Take 1 tablet (25 mg total) by mouth daily before breakfast. [name], MD  Active   Fludrocortisone Acetate (FLORINEF PO) [id number] Yes Take 0.1 mg by mouth daily. [provider] Taking Active   FLUoxetine (PROZAC) 10 MG tablet [id number] Yes TAKE 1 TABLET BY MOUTH EVERY DAY [name], MD Taking Active   furosemide (LASIX) 20 MG tablet [id number] Yes Take 20 mg by mouth daily.  [provider] Taking Active Self  hydrocortisone (CORTEF) 10 MG tablet [id number] Yes Take 5 mg by mouth 2 (two) times daily. 10 mg QAM, 5 PM [provider] Taking Active Self  insulin aspart (NOVOLOG FLEXPEN) 100 UNIT/ML FlexPen [id number] Yes Inject 16 units up to three times daily with meals. [name], MD Taking Active   insulin degludec (TRESIBA FLEXTOUCH) 200 UNIT/ML FlexTouch Pen [id number]  Inject 20 units daily. Titrate as instructed. Max daily dose: 40 units [name], MD  Active   levothyroxine (SYNTHROID) 100 MCG tablet [id number] Yes Take 100 mcg by mouth daily.  [provider] Taking Active   metoprolol succinate (TOPROL-XL) 25 MG 24 hr tablet [id number] Yes Take 25 mg by mouth daily. [provider] Taking Active   mirtazapine (REMERON) 7.5 MG tablet [id number]  TAKE 1 TABLET BY MOUTH EVERYDAY AT BEDTIME [name], MD  Active   Probiotic Product (PROBIOTIC ADVANCED PO) [id number] Yes Take 1 capsule by mouth daily. [provider] Taking Active   Semaglutide, 1 MG/DOSE, (OZEMPIC, 1 MG/DOSE,) 4 MG/3ML SOPN [id number] Yes Inject 1 mg into the skin once a week. [name], MD  Active   traZODone (DESYREL) 150 MG tablet [id number] Yes TAKE 1 TABLET (150 MG TOTAL) BY MOUTH AT  BEDTIME. [name], MD Taking Active           Patient Active Problem List   Diagnosis Date Noted  . Candida vaginitis 06/14/2020  . Chronic left SI joint pain 05/01/2019  . Transaminitis 05/01/2019  . Diarrhea in adult patient 03/29/2019  . Educated about COVID-19 virus infection 11/15/2018  . Anemia 07/06/2018  . Chronic atrial fibrillation ([place]) 05/25/2018  . Neuropathic pain of hand 05/03/2018  . History of cardiac arrest 05/03/2018  . Cerebral infarction, watershed distribution, bilateral, remote, resolved 05/03/2018  . Chronic systolic heart failure ([place]) 04/26/2018  . Dysphagia, oropharyngeal 04/26/2018  . Chronic constipation 04/26/2018  . Adrenal insufficiency (Addison's disease) ([place]) 01/20/2018  . Hospital discharge follow-up 01/20/2018  . 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 CHB (complete heart block) ([place]) 08/17/2017  . Mobitz type 2 second degree AV block 08/09/2017  . Decreased GFR 05/18/2017  . Hyponatremia 11/01/2016  . Drug-induced vitamin B12 deficiency anemia 04/16/2016  . Type 2 diabetes mellitus with hyperglycemia, with long-term current use of insulin ([place]) 08/21/2015  . Overweight (BMI 25.0-29.9) 10/07/2014  . Depression with anxiety 08/08/2014  . Encounter for preventive health  examination 09/13/2013  . Hyperlipidemia associated with type 2 diabetes mellitus ([place]) 07/25/2013  . Insomnia 07/25/2013  . Hypothyroidism 03/29/2012  . Screening for breast cancer 03/29/2012  . Screening for colon cancer 03/29/2012  . Depression, recurrent ([place]) 08/13/2008  . GERD 08/13/2008  . Osteopenia 08/13/2008  . COLONIC POLYPS, HX OF 08/13/2008    Immunization History  Administered Date(s) Administered  . Fluad Quad(high Dose 65+) 06/12/2020  . H1N1 08/13/2008  . Influenza Split 07/28/2011, 06/28/2012  . Influenza, High Dose Seasonal PF 04/14/2016, 07/10/2017, 04/11/2018, 05/07/2019  . Influenza,inj,Quad PF,6+ Mos 07/25/2013, 08/21/2015  . Influenza-Unspecified 07/22/2015  . Moderna Sars-Covid-2 Vaccination 08/15/2019, 09/12/2019, 06/24/2020  . Pneumococcal Conjugate-13 09/13/2013  . Pneumococcal Polysaccharide-23 07/30/2011  . Zoster 09/13/2013    Conditions to be addressed/monitored: Atrial Fibrillation, HTN, HLD, DMII and Addison's disease  Care Plan : Medication Management  Updates made by De Hollingshead, RPH-CPP since 10/20/2020 12:00 AM    Problem: Diabetes, Addison's Disease, Atrial Fibrillation, Osteoporosis     Long-Range Goal: Disease Progression Prevention   This Visit's Progress: On track  Recent Progress: On track  Priority: High  Note:   Current Barriers:  . Unable to independently monitor therapeutic efficacy . Unable to achieve control of diabetes  . Complex patient with multiple comorbidities, including diabetes, Addison's disease, atrial fibrillation, hx pulmonic valve replacement, osteoporosis  Pharmacist Clinical Goal(s):  Marland Kitchen Over the next 90 days, patient will verbalize ability to afford treatment regimen. . Over the next 90 days, patient will achieve adherence to monitoring guidelines and medication adherence to achieve therapeutic efficacy. . Over the next 90 days, patient will achieve control of diabetes as evidenced by  improvement  in A1c through collaboration with PharmD and provider.   Interventions: . 1:1 collaboration with [name], MD regarding development and update of comprehensive plan of care as evidenced by provider attestation and co-signature . Inter-disciplinary care team collaboration (see longitudinal plan of care) . Comprehensive medication review performed; medication list updated in electronic medical record  SDOH . Over income for patient assistance for [employer] ([place], [place], [place]), [employer] ([place]) or [place] ([place]). Denies cost concerns with medications at this time  Health Maintenance: . Up to date on COVID vaccinations  Diabetes: . Uncontrolled; current treatment: Ozempic 0.5 mg, Tresiba  26 units QPM, Jardiance 10 mg daily, Novolog 18 units with breakfast and supper (generally not eating lunch) - did not remember to increase to 14 as we discussed at last appointment o Hx metformin, but discontinued by endocrinology previously d/t elevated lactic acid and risk of lactic acidosis . C-peptide WNL, indicating endogenous insulin production and likely benefit from GLP1 . Current glucose readings:  Date of Download: 3/30-4/12/22 % Time CGM is active: 52% (<70% reduces ability to accurately interpret) Average Glucose: 215 mg/dL (improved from last visit, was 240) Glucose Management Indicator: 8.5% Glucose Variability: 34.9 (goal <36%) Time in Goal:  - Time in range 70-180: 36% (improved from last visit, was at 25%) - Time above range: 64% (improved from last visit, was at 75%) - Time below range: 0% Observed patterns: [place] within target range. Elevations significant post meals. Likely impacted by Addision's Tx. . Reviewed CGM data with patient. Explained need to scan at least [facility], showed gaps in download due to occasional infrequency of scanning. She verbalized understanding . Had patient demonstrate what dose she was injecting for Novolog,  Tresiba, and Ozempic. Patient is injecting correct doses.  . Better post prandial coverage needed. Maximize Jardiance - patient will take 2 10 mg tablets daily until she completes supply, then will increase to 25 mg daily. Maximize Ozempic - patient will take 2 0.5 mg doses weekly until she completes supply, then will fill script for 1 mg pen strength.  . Reduce Tresiba to 20 units daily to reduce risk of fasting hypoglycemia  . Continue Novolog 16 units with meals. Confirmed appropriate administration time.   Atrial Fibrillation, HTN, hx pulmonic valve replacement . Appropriately managed, last clinic BP at goal; current treatment: metoprolol succinate 25 mg daily, furosemide 20 mg daily . Anticoagulant regimen: Eliquis 5 mg BID . As noted above, over income for patient assistance.  . Recommended to continue current regimen at this time  Hyperlipidemia: . Uncontrolled per last lipid panel, though anticipated to improve given recent dose increase; current treatment: atorvastatin 80 mg daily  . Recommended to continue current regimen at this time . Due for repeat lipid panel, which is ordered.  Addison's Disease . Appropriately managed; current treatment; hydrocortisone 10 mg QAM, 5 mg QPM; fludrocortisone 0.1 mg daily . Recommend to continue current regimen at this time with collaboration w/ PCP (patient declined continued endocrinology f/u).  . Monitor for nausea/vomiting w/ GLP1 and need to consider stress dosing if develops.   Depression/anxiety/insomnia: . Appropriately managed per patient symptom report; current treatment: fluoxetine 10 mg daily, trazodone 150 mg QPM, mirtazapine 7.5 mg daily  . Recommend to continue current regimen at this time. Continue to monitor for s/sx serotonin syndrome given high serotonergic burden. Monitor weight gain, appetite w/ mirtazapine.   Hypothyroidism: . Controlled per last lab result; levothyroxine 100 mcg daily . Recommended to continue current  regimen at this time  Osteoporosis: . Untreated; Will discuss treatment, including appropriate calcium + vitamin D and falls risk reduction moving forward. Encouraged to discuss w/ PCP at next appointment. Consider bisphosphonate vs Prolia.   Patient Goals/Self-Care Activities . Over the next 30 days, patient will:  - take medications as prescribed check blood glucose at least three times daily using CGM, document, and provide at future appointments  Follow Up Plan: Telephone follow up appointment with care management team member scheduled for:~ 4 weeks      Medication Assistance: None required.  Patient affirms current coverage meets needs.  Patient's preferred pharmacy is:  [id number]Phone: [phone]: [phone] Uses pill box? Yes Pt endorses 100% compliance  Follow Up:  Patient agrees to Care Plan and Follow-up.  Plan: Telephone follow up appointment with care management team member scheduled for:  ~ 4 weeks  Catie TDarnelle Maffucci PharmD, [place] CAndersonvilleClinical Pharmacist [facility]cc[facility] [phone]

## 2020-10-20 NOTE — Patient Instructions (Addendum)
It was great to meet you face to face today!  INCREASE Jardiance. Take 2 of the 10 mg tablets you currently have until you complete that supply. Then, start taking one 25 mg tablet every day. I have sent this order to the pharmacy.   INCREASE Ozempic. Inject two doses of 0.5 mg every week. There are 4 doses of 0.5 mg in each red pen, so you will now get 2 weeks of medication from each Ozempic pen. When you complete this supply, fill the Ozempic 1 mg pen. It has a blue label instead of a red label. You will do 1 shot of 1 mg each week.   DECREASE Tresiba to 20 units daily. I do not want the two dose increases above to cause low blood sugars.   CONTINUE Novolog 16 units with each meal.   Call me with any questions or concerns!  Catie Darnelle Maffucci, PharmD [phone]  Visit Information  PATIENT GOALS: Goals Addressed              This Visit's Progress     Patient Stated   .  Medication Management (pt-stated)        Patient Goals/Self-Care Activities . Over the next 90 days, patient will:  - take medications as prescribed check blood glucose three times daily using CGM, document, and provide at future appointments        Print copy of patient instructions, educational materials, and care plan provided in person.   Plan: Telephone follow up appointment with care management team member scheduled for:  ~ 4 weeks  Catie Darnelle Maffucci, PharmD, [place], [place] Clinical Pharmacist [employer] at [employer] [phone]

## 2020-10-29 ENCOUNTER — Other Ambulatory Visit: Payer: Self-pay | Admitting: Internal Medicine

## 2020-10-29 DIAGNOSIS — N760 Acute vaginitis: Secondary | ICD-10-CM | POA: Insufficient documentation

## 2020-10-29 MED ORDER — FLUCONAZOLE 150 MG PO TABS: 150.0000 mg | ORAL_TABLET | Freq: Every day | ORAL | 0 refills | Status: DC

## 2020-10-29 NOTE — Assessment & Plan Note (Signed)
empiric fluconazole sent for patient described symptoms.  Pelvic exam needed if it does not resolve

## 2020-11-08 ENCOUNTER — Other Ambulatory Visit: Payer: Self-pay | Admitting: Internal Medicine

## 2020-11-17 ENCOUNTER — Ambulatory Visit (INDEPENDENT_AMBULATORY_CARE_PROVIDER_SITE_OTHER): Payer: HMO | Admitting: Pharmacist

## 2020-11-17 DIAGNOSIS — I482 Chronic atrial fibrillation, unspecified: Secondary | ICD-10-CM

## 2020-11-17 DIAGNOSIS — E039 Hypothyroidism, unspecified: Secondary | ICD-10-CM

## 2020-11-17 DIAGNOSIS — Z794 Long term (current) use of insulin: Secondary | ICD-10-CM

## 2020-11-17 DIAGNOSIS — E785 Hyperlipidemia, unspecified: Secondary | ICD-10-CM

## 2020-11-17 DIAGNOSIS — E1169 Type 2 diabetes mellitus with other specified complication: Secondary | ICD-10-CM

## 2020-11-17 DIAGNOSIS — E271 Primary adrenocortical insufficiency: Secondary | ICD-10-CM

## 2020-11-17 DIAGNOSIS — E1165 Type 2 diabetes mellitus with hyperglycemia: Secondary | ICD-10-CM

## 2020-11-17 DIAGNOSIS — I5022 Chronic systolic (congestive) heart failure: Secondary | ICD-10-CM

## 2020-11-17 MED ORDER — NOVOLOG FLEXPEN 100 UNIT/ML ~~LOC~~ SOPN: PEN_INJECTOR | SUBCUTANEOUS | 3 refills | Status: DC

## 2020-11-17 NOTE — Patient Instructions (Signed)
Visit Information  PATIENT GOALS: Goals Addressed              This Visit's Progress     Patient Stated   .  Medication Management (pt-stated)        Patient Goals/Self-Care Activities . Over the next 90 days, patient will:  - take medications as prescribed check blood glucose three times daily using CGM, document, and provide at future appointments       Patient verbalizes understanding of instructions provided today and agrees to view in [place].   Plan: Telephone follow up appointment with care management team member scheduled for:  2 weeks  Catie Darnelle Maffucci, PharmD, [place], [place] Clinical Pharmacist [employer] at [employer] [phone]

## 2020-11-17 NOTE — Chronic Care Management (AMB) (Signed)
Chronic Care Management Pharmacy Note  11/17/2020 Name:  Haley Young MRN:  [id number] DOB:  10-24-1950  Subjective: Haley Young is an 70 y.o. year old female who is a primary patient of Derrel Nip, Aris Everts, MD.  The CCM team was consulted for assistance with disease management and care coordination needs.    Engaged with patient by telephone for follow up visit in response to provider referral for pharmacy case management and/or care coordination services.   Consent to Services:  The patient was given information about Chronic Care Management services, agreed to services, and gave verbal consent prior to initiation of services.  Please see initial visit note for detailed documentation.   Patient Care Team: [name], MD as PCP - General (Internal Medicine) De Hollingshead, RPH-CPP (Pharmacist)  Recent office visits: None since our last call  Recent consult visits: None since our last call  Hospital visits: None in previous 6 months  Objective:  Lab Results  Component Value Date   CREATININE 1.16 (H) 06/12/2020   CREATININE 1.17 (H) 03/20/2020   CREATININE 0.87 05/02/2019    Lab Results  Component Value Date   HGBA1C 13.2 (H) 06/12/2020   Last diabetic Eye exam:  Lab Results  Component Value Date/Time   HMDIABEYEEXA No Retinopathy 06/26/2019 12:00 AM    Last diabetic Foot exam:  Lab Results  Component Value Date/Time   HMDIABFOOTEX Normal 09/13/2013 12:00 AM        Component Value Date/Time   CHOL 182 06/12/2020 1625   TRIG 114 06/12/2020 1625   HDL 48 (L) 06/12/2020 1625   CHOLHDL 3.8 06/12/2020 1625   VLDL 18.0 05/02/2019 1102   LDLCALC 112 (H) 06/12/2020 1625   LDLDIRECT 165.0 04/14/2016 1012    Hepatic Function Latest Ref Rng & Units 06/12/2020 03/20/2020 05/02/2019  Total Protein 6.1 - 8.1 g/dL 7.3 7.3 6.7  Albumin 3.5 - 5.0 g/dL - 4.0 3.9  AST 10 - 35 U/L 36(H) 33 36  ALT 6 - 29 U/L 32(H) 25 28  Alk Phosphatase 38 - 126 U/L - 85 102   Total Bilirubin 0.2 - 1.2 mg/dL 0.5 1.0 0.4  Bilirubin, Direct 0.0 - 0.2 mg/dL - 0.2 -    Lab Results  Component Value Date/Time   TSH 4.112 03/20/2020 01:32 PM   TSH 0.76 05/02/2019 11:02 AM   TSH 0.29 (A) 08/30/2018 12:00 AM   TSH 3.54 01/17/2018 11:45 AM   FREET4 1.71 (H) 01/25/2016 11:04 AM   FREET4 1.19 08/21/2015 10:49 AM    CBC Latest Ref Rng & Units 03/20/2020 08/30/2018 06/29/2018  WBC 4.0 - 10.5 K/uL 13.0(H) 17.0 -  Hemoglobin 12.0 - 15.0 g/dL 15.6(H) - 15.4  Hematocrit 36.0 - 46.0 % 48.9(H) - -  Platelets 150 - 400 K/uL 164 - -    Lab Results  Component Value Date/Time   VD25OH 30.95 04/14/2016 10:12 AM   VD25OH 44 09/13/2013 09:34 AM   VD25OH 29 (L) 12/08/2009 08:42 PM    Clinical ASCVD: Yes  The ASCVD Risk score Mikey Bussing DC Jr., et al., 2013) failed to calculate for the following reasons:   The patient has a prior MI or stroke diagnosis      Social History   Tobacco Use  Smoking Status Never Smoker  Smokeless Tobacco Never Used   BP Readings from Last 3 Encounters:  06/12/20 102/64  03/20/20 (!) 143/65  03/20/20 (!) 82/58   Pulse Readings from Last 3 Encounters:  06/12/20  87  03/20/20 97  03/20/20 89   Wt Readings from Last 3 Encounters:  07/27/20 142 lb (64.4 kg)  07/15/20 136 lb (61.7 kg)  06/12/20 136 lb 6.4 oz (61.9 kg)    Assessment: Review of patient past medical history, allergies, medications, health status, including review of consultants reports, laboratory and other test data, was performed as part of comprehensive evaluation and provision of chronic care management services.   SDOH:  (Social Determinants of Health) assessments and interventions performed:    CCM Care Plan  Allergies  Allergen Reactions  . Oxycodone Hcl     REACTION: hallucinations  . Albuterol Anxiety    Other reaction(s): Other (See Comments) Tremors   . Oxycodone Hcl Other (See Comments)    halucinations    Medications Reviewed Today    Reviewed by  De Hollingshead, RPH-CPP (Pharmacist) on 11/17/20 at 0904  Med List Status: <None>  Medication Order Taking? Sig Documenting Provider Last Dose Status Informant  apixaban (ELIQUIS) 5 MG TABS tablet [id number]  Take 1 tablet (5 mg total) by mouth 2 (two) times daily. [name], MD  Active   atorvastatin (LIPITOR) 80 MG tablet [id number]  TAKE 1 TABLET BY MOUTH EVERY DAY [name], MD  Active   Continuous Blood Gluc Receiver (FREESTYLE LIBRE 2 READER) [place] [id number]  Use to check glucose at least TID [name], MD  Active   Continuous Blood Gluc Sensor (FREESTYLE LIBRE 2 SENSOR) [place] [id number]  Use to check glucose at least TID [name], MD  Active   empagliflozin (JARDIANCE) 25 MG TABS tablet [id number] Yes Take 1 tablet (25 mg total) by mouth daily before breakfast. [name], MD Taking Active            Med Note De Hollingshead   Tue Nov 17, 2020  9:04 AM) Finishing 10 mg supply - 2 tablets  fluconazole (DIFLUCAN) 150 MG tablet [id number]  Take 1 tablet (150 mg total) by mouth daily. [name], MD  Active   Fludrocortisone Acetate (FLORINEF PO) [id number]  Take 0.1 mg by mouth daily. [provider]  Active   FLUoxetine (PROZAC) 10 MG tablet [id number]  TAKE 1 TABLET BY MOUTH EVERY DAY [name], MD  Active   furosemide (LASIX) 20 MG tablet [id number]  Take 20 mg by mouth daily.  [provider]  Active Self  hydrocortisone (CORTEF) 10 MG tablet [id number]  Take 5 mg by mouth 2 (two) times daily. 10 mg QAM, 5 PM [provider]  Active Self  insulin aspart (NOVOLOG FLEXPEN) 100 UNIT/ML FlexPen [id number] Yes Inject 16 units up to three times daily with meals. [name], MD Taking Active   insulin degludec (TRESIBA FLEXTOUCH) 200 UNIT/ML FlexTouch Pen [id number] Yes Inject 20 units daily. Titrate as instructed. Max daily dose: 40 units [name], MD Taking Active   levothyroxine (SYNTHROID) 100 MCG tablet [id number]  Take  100 mcg by mouth daily. [provider]  Active   metoprolol succinate (TOPROL-XL) 25 MG 24 hr tablet [id number]  Take 25 mg by mouth daily. [provider]  Active   mirtazapine (REMERON) 7.5 MG tablet [id number]  TAKE 1 TABLET BY MOUTH EVERYDAY AT BEDTIME [name], MD  Active   Probiotic Product (PROBIOTIC ADVANCED PO) [id number]  Take 1 capsule by mouth daily. [provider]  Active   Semaglutide, 1 MG/DOSE, (OZEMPIC, 1 MG/DOSE,) 4 MG/3ML SOPN [id number] Yes Inject 1  mg into the skin once a week. [name], MD Taking Active   traZODone (DESYREL) 150 MG tablet [id number]  TAKE 1 TABLET (150 MG TOTAL) BY MOUTH AT BEDTIME. [name], MD  Active           Patient Active Problem List   Diagnosis Date Noted  . Vaginitis 10/29/2020  . Candida vaginitis 06/14/2020  . Chronic left SI joint pain 05/01/2019  . Transaminitis 05/01/2019  . Diarrhea in adult patient 03/29/2019  . Educated about COVID-19 virus infection 11/15/2018  . Anemia 07/06/2018  . Chronic atrial fibrillation ([place]) 05/25/2018  . Neuropathic pain of hand 05/03/2018  . History of cardiac arrest 05/03/2018  . Cerebral infarction, watershed distribution, bilateral, remote, resolved 05/03/2018  . Chronic systolic heart failure ([place]) 04/26/2018  . Dysphagia, oropharyngeal 04/26/2018  . Chronic constipation 04/26/2018  . Adrenal insufficiency (Addison's disease) ([place]) 01/20/2018  . Hospital discharge follow-up 01/20/2018  . 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 CHB (complete heart block) ([place]) 08/17/2017  . Mobitz type 2 second degree AV block 08/09/2017  . Decreased GFR 05/18/2017  . Hyponatremia 11/01/2016  . Drug-induced vitamin B12 deficiency anemia 04/16/2016  . Type 2 diabetes mellitus with hyperglycemia, with long-term current use of insulin ([place]) 08/21/2015  . Overweight (BMI 25.0-29.9) 10/07/2014  . Depression with anxiety 08/08/2014  . Encounter for  preventive health examination 09/13/2013  . Hyperlipidemia associated with type 2 diabetes mellitus ([place]) 07/25/2013  . Insomnia 07/25/2013  . Hypothyroidism 03/29/2012  . Screening for breast cancer 03/29/2012  . Screening for colon cancer 03/29/2012  . Depression, recurrent ([place]) 08/13/2008  . GERD 08/13/2008  . Osteopenia 08/13/2008  . COLONIC POLYPS, HX OF 08/13/2008    Immunization History  Administered Date(s) Administered  . Fluad Quad(high Dose 65+) 06/12/2020  . H1N1 08/13/2008  . Influenza Split 07/28/2011, 06/28/2012  . Influenza, High Dose Seasonal PF 04/14/2016, 07/10/2017, 04/11/2018, 05/07/2019  . Influenza,inj,Quad PF,6+ Mos 07/25/2013, 08/21/2015  . Influenza-Unspecified 07/22/2015  . Moderna Sars-Covid-2 Vaccination 08/15/2019, 09/12/2019, 06/24/2020  . Pneumococcal Conjugate-13 09/13/2013  . Pneumococcal Polysaccharide-23 07/30/2011  . Zoster 09/13/2013    Conditions to be addressed/monitored: HTN, HLD and DMII  Care Plan : Medication Management  Updates made by De Hollingshead, RPH-CPP since 11/17/2020 12:00 AM    Problem: Diabetes, Addison's Disease, Atrial Fibrillation, Osteoporosis     Long-Range Goal: Disease Progression Prevention   This Visit's Progress: On track  Recent Progress: On track  Priority: High  Note:   Current Barriers:  . Unable to independently monitor therapeutic efficacy . Unable to achieve control of diabetes  . Complex patient with multiple comorbidities, including diabetes, Addison's disease, atrial fibrillation, hx pulmonic valve replacement, osteoporosis  Pharmacist Clinical Goal(s):  Marland Kitchen Over the next 90 days, patient will verbalize ability to afford treatment regimen. . Over the next 90 days, patient will achieve adherence to monitoring guidelines and medication adherence to achieve therapeutic efficacy. . Over the next 90 days, patient will achieve control of diabetes as evidenced by  improvement in A1c through  collaboration with PharmD and provider.   Interventions: . 1:1 collaboration with [name], MD regarding development and update of comprehensive plan of care as evidenced by provider attestation and co-signature . Inter-disciplinary care team collaboration (see longitudinal plan of care) . Comprehensive medication review performed; medication list updated in electronic medical record  SDOH . Over income for patient assistance for [employer] ([place], [place], [place]), [place] ([place]) or [place]  [employer] ([place]). Denies cost concerns with medications at this time  Health Maintenance: . Up to date on COVID vaccinations . Reports decreased energy, more specifically in the past month. Overdue for f/u with PCP. Will collaborate w/ office staff to outreach to schedule   Diabetes: . Uncontrolled; current treatment: Ozempic 1 mg, Tresiba 20 units QPM, Jardiance 20 mg daily (completing supply of 10 mg tab before starting 25 mg tab), Novolog 8 units with breakfast and supper (generally not eating lunch) - misunderstood instructions last time . Does note increase constipation with Ozempic dose increase. Added Metamucil. o Hx metformin, but discontinued by endocrinology previously d/t elevated lactic acid and risk of lactic acidosis . C-peptide WNL, indicating endogenous insulin production and likely benefit from GLP1 . Current meal patterns: breakfast: coffee, oatmeal - 2 servings, grits - 2 servings, or Bel[place]ita breakfast cookies (36 g CHO), lunch: sometimes skips, but sometimes eats chicken salad with crackers, banana sandwich; supper: more often take out - egg drop soup, egg roll, [place] soup, chef salad w/ ranch; cook - spaghetti, but she tries to minimize how much, tropical cafe - acai berry smoothie and peanut chicken wrap; drinks: flavored water, water, unsweet tea, sierra mist zero;  . Current physical activity: none  . Current glucose readings:  Date of Download:  4/26-11/16/20 % Time CGM is active: 63% (<70% reduces ability to accurately interpret) Average Glucose: 236 mg/dL  Glucose Management Indicator: 9.0% Glucose Variability: 34.6 (goal <36%) Time in Goal:  - Time in range 70-180: 32%  - Time above range: 68%  - Time below range: 0% Observed patterns: [place] within target range. Elevations significant post meals.  . Sugars increased since last time, though Tyler Aas was proactively reduced to reduce risk of fasting hypoglycemia, and patient was taking less Novolog than ordered.  . Reviewed CGM data with patient. Explained need to scan at least [facility], showed gaps in download due to occasional infrequency of scanning. She verbalized understanding . Extensive dietary discussion. Patient often eating large servings of carbohydrates. Recommended to reduce carbohydrate portion sizes - for example, only eating 1 serving of grits or oatmeal, eating 1/2 banana w/ 1 slice of bread in sandwich, focus on more cooking of lean proteins. Patient verbalized understanding . Encouraged focus on hydration, fiber intake in meals to improve constipation. . Increase Novolog from 8 units with meals to 12 units with meals.   Atrial Fibrillation, HTN, hx pulmonic valve replacement . Appropriately managed, last clinic BP at goal; current treatment: metoprolol succinate 25 mg daily, furosemide 20 mg daily . Anticoagulant regimen: Eliquis 5 mg BID . As noted above, over income for patient assistance.  . Recommended to continue current regimen at this time  Hyperlipidemia: . Uncontrolled per last lipid panel, though anticipated to improve given recent dose increase; current treatment: atorvastatin 80 mg daily  . Recommended to continue current regimen at this time . Due for repeat lipid panel with upcoming PCP appt  Addison's Disease . Appropriately managed; current treatment; hydrocortisone 10 mg QAM, 5 mg QPM; fludrocortisone 0.1 mg daily . Recommend to continue current  regimen at this time with collaboration w/ PCP (patient declined continued endocrinology f/u).  . Monitor for nausea/vomiting w/ GLP1 and need to consider stress dosing if develops.   Depression/anxiety/insomnia: . Appropriately managed per patient symptom report; current treatment: fluoxetine 10 mg daily, trazodone 150 mg QPM, mirtazapine 7.5 mg daily  . Recommend to continue current regimen at this time.  . Continue to monitor for s/sx  serotonin syndrome given high serotonergic burden. Monitor weight gain, appetite w/ mirtazapine.   Hypothyroidism: . Controlled per last lab result; levothyroxine 100 mcg daily . Recommended to continue current regimen at this time  Osteoporosis: . Untreated; Will discuss treatment, including appropriate calcium + vitamin D and falls risk reduction moving forward. Encouraged to discuss w/ PCP at next appointment. Consider bisphosphonate vs Prolia.   Patient Goals/Self-Care Activities . Over the next 30 days, patient will:  - take medications as prescribed check blood glucose at least three times daily using CGM, document, and provide at future appointments  Follow Up Plan: Telephone follow up appointment with care management team member scheduled for:~ 2 weeks      Medication Assistance: None required.  Patient affirms current coverage meets needs.  Patient's preferred pharmacy is:  [id number] [place] [place]Phone: [phone]: [phone] Follow Up:  Patient agrees to Care Plan and Follow-up.  Plan: Telephone follow up appointment with care management team member scheduled for:  2 weeks  Catie TDarnelle Maffucci PharmD, [place] CHodgesClinical Pharmacist LOccidental Petroleumat [facility] [phone]

## 2020-12-01 ENCOUNTER — Ambulatory Visit: Payer: HMO | Admitting: Pharmacist

## 2020-12-01 DIAGNOSIS — E1165 Type 2 diabetes mellitus with hyperglycemia: Secondary | ICD-10-CM | POA: Diagnosis not present

## 2020-12-01 DIAGNOSIS — E271 Primary adrenocortical insufficiency: Secondary | ICD-10-CM

## 2020-12-01 DIAGNOSIS — E785 Hyperlipidemia, unspecified: Secondary | ICD-10-CM | POA: Diagnosis not present

## 2020-12-01 DIAGNOSIS — I482 Chronic atrial fibrillation, unspecified: Secondary | ICD-10-CM | POA: Diagnosis not present

## 2020-12-01 DIAGNOSIS — E1169 Type 2 diabetes mellitus with other specified complication: Secondary | ICD-10-CM

## 2020-12-01 DIAGNOSIS — Z794 Long term (current) use of insulin: Secondary | ICD-10-CM | POA: Diagnosis not present

## 2020-12-01 DIAGNOSIS — I5022 Chronic systolic (congestive) heart failure: Secondary | ICD-10-CM

## 2020-12-01 DIAGNOSIS — E039 Hypothyroidism, unspecified: Secondary | ICD-10-CM

## 2020-12-01 MED ORDER — TRESIBA FLEXTOUCH 200 UNIT/ML ~~LOC~~ SOPN: 18.0000 [IU] | PEN_INJECTOR | Freq: Every day | SUBCUTANEOUS | 2 refills | Status: DC

## 2020-12-01 MED ORDER — NOVOLOG FLEXPEN 100 UNIT/ML ~~LOC~~ SOPN: PEN_INJECTOR | SUBCUTANEOUS | 3 refills | Status: DC

## 2020-12-01 NOTE — Patient Instructions (Addendum)
Haley Young,   It is INCREDIBLY important for you to remember to take the Novolog prior to every meal. Novolog is OK at room temperature for 28 days, so you can keep a pen in your pursue when you go out to eat. I've had some patients make a sign to hang in their kitchen to remind themselves prior to meals at home, or, keep a checklist in your book to check off when you give yourself the Novolog prior to every meal, so that you can keep up with it.   DECREASE Tresiba to 18 units daily (since you are having some low readings in the mornings) INCREASE Novolog - take 14 units before breakfast, 14 units if you eat lunch, and 16 units before supper. Try to take this about 15-20 minutes before meals.  Continue Ozempic 1 mg weekly and Jardiance 25 mg daily.   Please work on scanning your sensor more often. Try scanning when you wake up, before each meal, and when you go to bed. This is going to be the best way to make sure we capture all of your glucose information to best inform treatment decisions.   Call me with any questions or concerns!  Catie Darnelle Maffucci, PharmD [phone]    Visit Information  PATIENT GOALS: Goals Addressed              This Visit's Progress     Patient Stated   .  Medication Management (pt-stated)        Patient Goals/Self-Care Activities . Over the next 90 days, patient will:  - take medications as prescribed check blood glucose three times daily using CGM, document, and provide at future appointments        Patient verbalizes understanding of instructions provided today and agrees to view in [place].   Plan: Telephone follow up appointment with care management team member scheduled for:  ~ 3 weeks  Catie Darnelle Maffucci, PharmD, [name], [place] Clinical Pharmacist [employer] [facility] [employer] [phone]

## 2020-12-01 NOTE — Chronic Care Management (AMB) (Signed)
Chronic Care Management Pharmacy Note  12/01/2020 Name:  Haley Young MRN:  [id number] DOB:  1951-02-17  Subjective: Haley Young is an 70 y.o. year old female who is a primary patient of Derrel Nip, Aris Everts, MD.  The CCM team was consulted for assistance with disease management and care coordination needs.    Engaged with patient by telephone for follow up visit in response to provider referral for pharmacy case management and/or care coordination services.   Consent to Services:  The patient was given information about Chronic Care Management services, agreed to services, and gave verbal consent prior to initiation of services.  Please see initial visit note for detailed documentation.   Patient Care Team: [name], MD as PCP - General (Internal Medicine) De Hollingshead, RPH-CPP (Pharmacist)  Recent office visits: None since our last call  Recent consult visits: None since our last call  Hospital visits: None in previous 6 months  Objective:  Lab Results  Component Value Date   CREATININE 1.16 (H) 06/12/2020   CREATININE 1.17 (H) 03/20/2020   CREATININE 0.87 05/02/2019    Lab Results  Component Value Date   HGBA1C 13.2 (H) 06/12/2020   Last diabetic Eye exam:  Lab Results  Component Value Date/Time   HMDIABEYEEXA No Retinopathy 06/26/2019 12:00 AM    Last diabetic Foot exam:  Lab Results  Component Value Date/Time   HMDIABFOOTEX Normal 09/13/2013 12:00 AM        Component Value Date/Time   CHOL 182 06/12/2020 1625   TRIG 114 06/12/2020 1625   HDL 48 (L) 06/12/2020 1625   CHOLHDL 3.8 06/12/2020 1625   VLDL 18.0 05/02/2019 1102   LDLCALC 112 (H) 06/12/2020 1625   LDLDIRECT 165.0 04/14/2016 1012    Hepatic Function Latest Ref Rng & Units 06/12/2020 03/20/2020 05/02/2019  Total Protein 6.1 - 8.1 g/dL 7.3 7.3 6.7  Albumin 3.5 - 5.0 g/dL - 4.0 3.9  AST 10 - 35 U/L 36(H) 33 36  ALT 6 - 29 U/L 32(H) 25 28  Alk Phosphatase 38 - 126 U/L - 85 102   Total Bilirubin 0.2 - 1.2 mg/dL 0.5 1.0 0.4  Bilirubin, Direct 0.0 - 0.2 mg/dL - 0.2 -    Lab Results  Component Value Date/Time   TSH 4.112 03/20/2020 01:32 PM   TSH 0.76 05/02/2019 11:02 AM   TSH 0.29 (A) 08/30/2018 12:00 AM   TSH 3.54 01/17/2018 11:45 AM   FREET4 1.71 (H) 01/25/2016 11:04 AM   FREET4 1.19 08/21/2015 10:49 AM    CBC Latest Ref Rng & Units 03/20/2020 08/30/2018 06/29/2018  WBC 4.0 - 10.5 K/uL 13.0(H) 17.0 -  Hemoglobin 12.0 - 15.0 g/dL 15.6(H) - 15.4  Hematocrit 36.0 - 46.0 % 48.9(H) - -  Platelets 150 - 400 K/uL 164 - -    Lab Results  Component Value Date/Time   VD25OH 30.95 04/14/2016 10:12 AM   VD25OH 44 09/13/2013 09:34 AM   VD25OH 29 (L) 12/08/2009 08:42 PM    Clinical ASCVD: Yes  The ASCVD Risk score Mikey Bussing DC Jr., et al., 2013) failed to calculate for the following reasons:   The patient has a prior MI or stroke diagnosis     Social History   Tobacco Use  Smoking Status Never Smoker  Smokeless Tobacco Never Used   BP Readings from Last 3 Encounters:  06/12/20 102/64  03/20/20 (!) 143/65  03/20/20 (!) 82/58   Pulse Readings from Last 3 Encounters:  06/12/20 87  03/20/20 97  03/20/20 89   Wt Readings from Last 3 Encounters:  07/27/20 142 lb (64.4 kg)  07/15/20 136 lb (61.7 kg)  06/12/20 136 lb 6.4 oz (61.9 kg)    Assessment: Review of patient past medical history, allergies, medications, health status, including review of consultants reports, laboratory and other test data, was performed as part of comprehensive evaluation and provision of chronic care management services.   SDOH:  (Social Determinants of Health) assessments and interventions performed:    CCM Care Plan  Allergies  Allergen Reactions  . Oxycodone Hcl     REACTION: hallucinations  . Albuterol Anxiety    Other reaction(s): Other (See Comments) Tremors   . Oxycodone Hcl Other (See Comments)    halucinations    Medications Reviewed Today    Reviewed by  De Hollingshead, RPH-CPP (Pharmacist) on 12/01/20 at [phone]  Med List Status: <None>  Medication Order Taking? Sig Documenting Provider Last Dose Status Informant  apixaban (ELIQUIS) 5 MG TABS tablet [id number]  Take 1 tablet (5 mg total) by mouth 2 (two) times daily. [name], MD  Active   atorvastatin (LIPITOR) 80 MG tablet [id number]  TAKE 1 TABLET BY MOUTH EVERY DAY [name], MD  Active   Continuous Blood Gluc Receiver (FREESTYLE LIBRE 2 READER) [place] [id number]  Use to check glucose at least TID [name], MD  Active   Continuous Blood Gluc Sensor (FREESTYLE LIBRE 2 SENSOR) [place] [id number]  Use to check glucose at least TID [name], MD  Active   empagliflozin (JARDIANCE) 25 MG TABS tablet [id number] Yes Take 1 tablet (25 mg total) by mouth daily before breakfast. [name], MD Taking Active            Med Note De Hollingshead   Tue Dec 01, 2020  8:36 AM)    fluconazole (DIFLUCAN) 150 MG tablet [id number]  Take 1 tablet (150 mg total) by mouth daily. [name], MD  Active   Fludrocortisone Acetate (FLORINEF PO) [id number]  Take 0.1 mg by mouth daily. [provider]  Active   FLUoxetine (PROZAC) 10 MG tablet [id number]  TAKE 1 TABLET BY MOUTH EVERY DAY [name], MD  Active   furosemide (LASIX) 20 MG tablet [id number]  Take 20 mg by mouth daily.  [provider]  Active Self  hydrocortisone (CORTEF) 10 MG tablet [id number]  Take 5 mg by mouth 2 (two) times daily. 10 mg QAM, 5 PM [provider]  Active Self  insulin aspart (NOVOLOG FLEXPEN) 100 UNIT/ML FlexPen [id number] Yes Inject 12 units up to three times daily with meals. [name], MD Taking Active   insulin degludec (TRESIBA FLEXTOUCH) 200 UNIT/ML FlexTouch Pen [id number] Yes Inject 20 units daily. Titrate as instructed. Max daily dose: 40 units [name], MD Taking Active   levothyroxine (SYNTHROID) 100 MCG tablet [id number]  Take 100 mcg by mouth daily.  [provider]  Active   metoprolol succinate (TOPROL-XL) 25 MG 24 hr tablet [id number]  Take 25 mg by mouth daily. [provider]  Active   mirtazapine (REMERON) 7.5 MG tablet [id number] Yes TAKE 1 TABLET BY MOUTH EVERYDAY AT BEDTIME [name], MD Taking Active   Probiotic Product (PROBIOTIC ADVANCED PO) [id number]  Take 1 capsule by mouth daily. [provider]  Active   Semaglutide, 1 MG/DOSE, (OZEMPIC, 1 MG/DOSE,) 4 MG/3ML SOPN [id number] Yes Inject 1 mg into the skin once a week.  [name], MD Taking Active   traZODone (DESYREL) 150 MG tablet [id number]  TAKE 1 TABLET (150 MG TOTAL) BY MOUTH AT BEDTIME. [name], MD  Active           Patient Active Problem List   Diagnosis Date Noted  . Vaginitis 10/29/2020  . Candida vaginitis 06/14/2020  . Chronic left SI joint pain 05/01/2019  . Transaminitis 05/01/2019  . Diarrhea in adult patient 03/29/2019  . Educated about COVID-19 virus infection 11/15/2018  . Anemia 07/06/2018  . Chronic atrial fibrillation ([place]) 05/25/2018  . Neuropathic pain of hand 05/03/2018  . History of cardiac arrest 05/03/2018  . Cerebral infarction, watershed distribution, bilateral, remote, resolved 05/03/2018  . Chronic systolic heart failure ([place]) 04/26/2018  . Dysphagia, oropharyngeal 04/26/2018  . Chronic constipation 04/26/2018  . Adrenal insufficiency (Addison's disease) ([place]) 01/20/2018  . Hospital discharge follow-up 01/20/2018  . 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 CHB (complete heart block) ([place]) 08/17/2017  . Mobitz type 2 second degree AV block 08/09/2017  . Decreased GFR 05/18/2017  . Hyponatremia 11/01/2016  . Drug-induced vitamin B12 deficiency anemia 04/16/2016  . Type 2 diabetes mellitus with hyperglycemia, with long-term current use of insulin ([place]) 08/21/2015  . Overweight (BMI 25.0-29.9) 10/07/2014  . Depression with anxiety 08/08/2014  . Encounter for preventive  health examination 09/13/2013  . Hyperlipidemia associated with type 2 diabetes mellitus ([place]) 07/25/2013  . Insomnia 07/25/2013  . Hypothyroidism 03/29/2012  . Screening for breast cancer 03/29/2012  . Screening for colon cancer 03/29/2012  . Depression, recurrent ([place]) 08/13/2008  . GERD 08/13/2008  . Osteopenia 08/13/2008  . COLONIC POLYPS, HX OF 08/13/2008    Immunization History  Administered Date(s) Administered  . Fluad Quad(high Dose 65+) 06/12/2020  . H1N1 08/13/2008  . Influenza Split 07/28/2011, 06/28/2012  . Influenza, High Dose Seasonal PF 04/14/2016, 07/10/2017, 04/11/2018, 05/07/2019  . Influenza,inj,Quad PF,6+ Mos 07/25/2013, 08/21/2015  . Influenza-Unspecified 07/22/2015  . Moderna Sars-Covid-2 Vaccination 08/15/2019, 09/12/2019, 06/24/2020  . Pneumococcal Conjugate-13 09/13/2013  . Pneumococcal Polysaccharide-23 07/30/2011  . Zoster 09/13/2013    Conditions to be addressed/monitored: HTN, HLD and DMII  Care Plan : Medication Management  Updates made by De Hollingshead, RPH-CPP since 12/01/2020 12:00 AM    Problem: Diabetes, Addison's Disease, Atrial Fibrillation, Osteoporosis     Long-Range Goal: Disease Progression Prevention   This Visit's Progress: On track  Recent Progress: On track  Priority: High  Note:   Current Barriers:  . Unable to independently monitor therapeutic efficacy . Unable to achieve control of diabetes  . Complex patient with multiple comorbidities, including diabetes, Addison's disease, atrial fibrillation, hx pulmonic valve replacement, osteoporosis  Pharmacist Clinical Goal(s):  Marland Kitchen Over the next 90 days, patient will verbalize ability to afford treatment regimen. . Over the next 90 days, patient will achieve adherence to monitoring guidelines and medication adherence to achieve therapeutic efficacy. . Over the next 90 days, patient will achieve control of diabetes as evidenced by  improvement in A1c through collaboration with  PharmD and provider.   Interventions: . 1:1 collaboration with [name], MD regarding development and update of comprehensive plan of care as evidenced by provider attestation and co-signature . Inter-disciplinary care team collaboration (see longitudinal plan of care) . Comprehensive medication review performed; medication list updated in electronic medical record  SDOH . Over income for patient assistance for [employer] ([place], [place], [place]), [employer] ([place]) or [place] ([place]). Denies cost concerns with  medications at this time  Health Maintenance: . Up to date on COVID vaccinations.  Marland Kitchen PCP visit scheduled in July.  Diabetes: . Uncontrolled; current treatment: Ozempic 1 mg, Tresiba 20 units QPM, Jardiance 25 mg daily, Novolog 12 units with breakfast and supper (generally not eating lunch); does admit today that she will forget Novolog sometimes, especially if she is out o Hx metformin, but discontinued by endocrinology previously d/t elevated lactic acid and risk of lactic acidosis . C-peptide WNL, indicating endogenous insulin production and likely benefit from GLP1 . Current meal patterns: breakfast: coffee, reduced to 1 pack of oatmeal/grits, lunch: sometimes skips, but sometimes eats chicken salad with crackers, banana sandwich; supper: more often take out - egg drop soup, egg roll, [place] soup, chef salad w/ ranch; cook - spaghetti, but she tries to minimize how much, tropical cafe - acai berry smoothie and peanut chicken wrap; drinks: flavored water, water, unsweet tea, sierra mist zero;  . Current physical activity: none  . Current glucose readings:  Date of Download: 5/11-5/24/22 % Time CGM is active: 50% (<70% reduces ability to accurately interpret) Average Glucose: 189 mg/dL  Glucose Management Indicator: not enough data to interpret Glucose Variability: 38.5% (goal <36%) Time in Goal:  - Time in range 70-180: 51%  - Time above  range: 49%  - Time below range: 0% Observed patterns: [place] within target range, some lows. Elevated post prandials, higher after supper than breakfast.  . Decrease Tresiba to 18 units daily. Increase Novolog to 14 breakfast or lunch, 16 units with supper. Continue Ozempic 1 mg weekly, Jardiance 25 mg daily.  Marland Kitchen Extensive discussion about importance of adherence with Novolog. Reminded that Novolog is good outside of the fridge for 28 days, so she can keep a pen in her purse when she goes out. Consider making a sign to hang in the kitchen to remind prior to meals. Reviewed to take 15-20 minutes before meals.  . Again, reviewed importance of scanning at least Q8H to capture full sensor data.   Atrial Fibrillation, HTN, hx pulmonic valve replacement . Appropriately managed, last clinic BP at goal; current treatment: metoprolol succinate 25 mg daily, furosemide 20 mg daily . Anticoagulant regimen: Eliquis 5 mg BID . As noted above, over income for patient assistance.  . Previously recommended to continue current regimen at this time  Hyperlipidemia: . Uncontrolled per last lipid panel, though anticipated to improve given recent dose increase; current treatment: atorvastatin 80 mg daily  . Previously recommended to continue current regimen at this time. Due for repeat lipid panel with upcoming PCP appt  Addison's Disease . Appropriately managed; current treatment; hydrocortisone 10 mg QAM, 5 mg QPM; fludrocortisone 0.1 mg daily . Previously recommended to continue current regimen at this time with collaboration w/ PCP (patient declined continued endocrinology f/u).   Depression/anxiety/insomnia: . Appropriately managed per patient symptom report; current treatment: fluoxetine 10 mg daily, trazodone 150 mg QPM, mirtazapine 7.5 mg daily  . Previously recommended to continue current regimen at this time.  . Continue to monitor for s/sx serotonin syndrome given high serotonergic burden. Monitor  weight gain, appetite w/ mirtazapine.   Hypothyroidism: . Controlled per last lab result; levothyroxine 100 mcg daily . Previously recommended to continue current regimen at this time  Osteoporosis: . Untreated; Will discuss treatment, including appropriate calcium + vitamin D and falls risk reduction moving forward. Encouraged to discuss w/ PCP at next appointment. Consider bisphosphonate vs Prolia.   Patient Goals/Self-Care Activities . Over the next 90  days, patient will:  - take medications as prescribed check blood glucose at least three times daily using CGM, document, and provide at future appointments  Follow Up Plan: Telephone follow up appointment with care management team member scheduled for:~ 3 weeks      Medication Assistance: None required.  Patient affirms current coverage meets needs.  Patient's preferred pharmacy is:  [id number] [place] [address]Phone: [phone]: [phone]  Follow Up:  Patient agrees to Care Plan and Follow-up.  Plan: Telephone follow up appointment with care management team member scheduled for:  ~ 3 weeks  Catie TDarnelle Maffucci PharmD, BSouth [place] CVistaClinical Pharmacist LOccidental Petroleumat BJohnson & [phone]

## 2020-12-09 ENCOUNTER — Telehealth: Payer: Self-pay | Admitting: Internal Medicine

## 2020-12-09 ENCOUNTER — Ambulatory Visit: Payer: HMO | Admitting: Pharmacist

## 2020-12-09 DIAGNOSIS — E1165 Type 2 diabetes mellitus with hyperglycemia: Secondary | ICD-10-CM

## 2020-12-09 DIAGNOSIS — Z794 Long term (current) use of insulin: Secondary | ICD-10-CM

## 2020-12-09 DIAGNOSIS — E1169 Type 2 diabetes mellitus with other specified complication: Secondary | ICD-10-CM

## 2020-12-09 DIAGNOSIS — E785 Hyperlipidemia, unspecified: Secondary | ICD-10-CM

## 2020-12-09 DIAGNOSIS — I482 Chronic atrial fibrillation, unspecified: Secondary | ICD-10-CM

## 2020-12-09 DIAGNOSIS — E271 Primary adrenocortical insufficiency: Secondary | ICD-10-CM

## 2020-12-09 NOTE — Telephone Encounter (Signed)
Returned patient's call. Reviewed MyChart note. Patient verbalizes understanding

## 2020-12-09 NOTE — Chronic Care Management (AMB) (Signed)
Chronic Care Management Pharmacy Note  12/09/2020 Name:  KELSEA MOUSEL MRN:  [id number] DOB:  [birth year]   Subjective: KEIANA TAVELLA is an 70 y.o. year old female who is a primary patient of Derrel Nip, Aris Everts, MD.  The CCM team was consulted for assistance with disease management and care coordination needs.    Engaged with patient via [place] for medication management question in response to provider referral for pharmacy case management and/or care coordination services.   Consent to Services:  The patient was given information about Chronic Care Management services, agreed to services, and gave verbal consent prior to initiation of services.  Please see initial visit note for detailed documentation.   Patient Care Team: [name], MD as PCP - General (Internal Medicine) De Hollingshead, RPH-CPP (Pharmacist)  Recent office visits: None since our last call  Recent consult visits: None since our last call  Hospital visits: None in previous 6 months  Objective:  Lab Results  Component Value Date   CREATININE 1.16 (H) 06/12/2020   CREATININE 1.17 (H) 03/20/2020   CREATININE 0.87 05/02/2019    Lab Results  Component Value Date   HGBA1C 13.2 (H) 06/12/2020   Last diabetic Eye exam:  Lab Results  Component Value Date/Time   HMDIABEYEEXA No Retinopathy 06/26/2019 12:00 AM    Last diabetic Foot exam:  Lab Results  Component Value Date/Time   HMDIABFOOTEX Normal 09/13/2013 12:00 AM        Component Value Date/Time   CHOL 182 06/12/2020 1625   TRIG 114 06/12/2020 1625   HDL 48 (L) 06/12/2020 1625   CHOLHDL 3.8 06/12/2020 1625   VLDL 18.0 05/02/2019 1102   LDLCALC 112 (H) 06/12/2020 1625   LDLDIRECT 165.0 04/14/2016 1012    Hepatic Function Latest Ref Rng & Units 06/12/2020 03/20/2020 05/02/2019  Total Protein 6.1 - 8.1 g/dL 7.3 7.3 6.7  Albumin 3.5 - 5.0 g/dL - 4.0 3.9  AST 10 - 35 U/L 36(H) 33 36  ALT 6 - 29 U/L 32(H) 25 28  Alk Phosphatase 38 - 126  U/L - 85 102  Total Bilirubin 0.2 - 1.2 mg/dL 0.5 1.0 0.4  Bilirubin, Direct 0.0 - 0.2 mg/dL - 0.2 -    Lab Results  Component Value Date/Time   TSH 4.112 03/20/2020 01:32 PM   TSH 0.76 05/02/2019 11:02 AM   TSH 0.29 (A) 08/30/2018 12:00 AM   TSH 3.54 01/17/2018 11:45 AM   FREET4 1.71 (H) 01/25/2016 11:04 AM   FREET4 1.19 08/21/2015 10:49 AM    CBC Latest Ref Rng & Units 03/20/2020 08/30/2018 06/29/2018  WBC 4.0 - 10.5 K/uL 13.0(H) 17.0 -  Hemoglobin 12.0 - 15.0 g/dL 15.6(H) - 15.4  Hematocrit 36.0 - 46.0 % 48.9(H) - -  Platelets 150 - 400 K/uL 164 - -    Lab Results  Component Value Date/Time   VD25OH 30.95 04/14/2016 10:12 AM   VD25OH 44 09/13/2013 09:34 AM   VD25OH 29 (L) 12/08/2009 08:42 PM    Clinical ASCVD: Yes  The ASCVD Risk score Mikey Bussing DC Jr., et al., 2013) failed to calculate for the following reasons:   The patient has a prior MI or stroke diagnosis     Social History   Tobacco Use  Smoking Status Never Smoker  Smokeless Tobacco Never Used   BP Readings from Last 3 Encounters:  06/12/20 102/64  03/20/20 (!) 143/65  03/20/20 (!) 82/58   Pulse Readings from Last 3 Encounters:  06/12/20  87  03/20/20 97  03/20/20 89   Wt Readings from Last 3 Encounters:  07/27/20 142 lb (64.4 kg)  07/15/20 136 lb (61.7 kg)  06/12/20 136 lb 6.4 oz (61.9 kg)    Assessment: Review of patient past medical history, allergies, medications, health status, including review of consultants reports, laboratory and other test data, was performed as part of comprehensive evaluation and provision of chronic care management services.   SDOH:  (Social Determinants of Health) assessments and interventions performed:  SDOH Interventions   Flowsheet Row Most Recent Value  SDOH Interventions   Financial Strain Interventions Intervention Not Indicated      CCM Care Plan  Allergies  Allergen Reactions  . Oxycodone Hcl     REACTION: hallucinations  . Albuterol Anxiety    Other  reaction(s): Other (See Comments) Tremors   . Oxycodone Hcl Other (See Comments)    halucinations    Medications Reviewed Today    Reviewed by De Hollingshead, RPH-CPP (Pharmacist) on 12/01/20 at [phone]  Med List Status: <None>  Medication Order Taking? Sig Documenting Provider Last Dose Status Informant  apixaban (ELIQUIS) 5 MG TABS tablet [id number]  Take 1 tablet (5 mg total) by mouth 2 (two) times daily. [name], MD  Active   atorvastatin (LIPITOR) 80 MG tablet [id number]  TAKE 1 TABLET BY MOUTH EVERY DAY [name], MD  Active   Continuous Blood Gluc Receiver (FREESTYLE LIBRE 2 READER) [place] [id number]  Use to check glucose at least TID [name], MD  Active   Continuous Blood Gluc Sensor (FREESTYLE LIBRE 2 SENSOR) [place] [id number]  Use to check glucose at least TID [name], MD  Active   empagliflozin (JARDIANCE) 25 MG TABS tablet [id number] Yes Take 1 tablet (25 mg total) by mouth daily before breakfast. [name], MD Taking Active            Med Note De Hollingshead   Tue Dec 01, 2020  8:36 AM)    fluconazole (DIFLUCAN) 150 MG tablet [id number]  Take 1 tablet (150 mg total) by mouth daily. [name], MD  Active   Fludrocortisone Acetate (FLORINEF PO) [id number]  Take 0.1 mg by mouth daily. [provider]  Active   FLUoxetine (PROZAC) 10 MG tablet [id number]  TAKE 1 TABLET BY MOUTH EVERY DAY [name], MD  Active   furosemide (LASIX) 20 MG tablet [id number]  Take 20 mg by mouth daily.  [provider]  Active Self  hydrocortisone (CORTEF) 10 MG tablet [id number]  Take 5 mg by mouth 2 (two) times daily. 10 mg QAM, 5 PM [provider]  Active Self  insulin aspart (NOVOLOG FLEXPEN) 100 UNIT/ML FlexPen [id number] Yes Inject 12 units up to three times daily with meals. [name], MD Taking Active   insulin degludec (TRESIBA FLEXTOUCH) 200 UNIT/ML FlexTouch Pen [id number] Yes Inject 20 units daily. Titrate as instructed.  Max daily dose: 40 units [name], MD Taking Active   levothyroxine (SYNTHROID) 100 MCG tablet [id number]  Take 100 mcg by mouth daily. [provider]  Active   metoprolol succinate (TOPROL-XL) 25 MG 24 hr tablet [id number]  Take 25 mg by mouth daily. [provider]  Active   mirtazapine (REMERON) 7.5 MG tablet [id number] Yes TAKE 1 TABLET BY MOUTH EVERYDAY AT BEDTIME [name], MD Taking Active   Probiotic Product (PROBIOTIC ADVANCED PO) [id number]  Take 1 capsule by mouth daily. [provider]  Active   Semaglutide, 1 MG/DOSE, (OZEMPIC, 1 MG/DOSE,) 4 MG/3ML SOPN [id number] Yes Inject 1 mg into the skin once a week. [name], MD Taking Active   traZODone (DESYREL) 150 MG tablet [id number]  TAKE 1 TABLET (150 MG TOTAL) BY MOUTH AT BEDTIME. [name], MD  Active           Patient Active Problem List   Diagnosis Date Noted  . Vaginitis 10/29/2020  . Candida vaginitis 06/14/2020  . Chronic left SI joint pain 05/01/2019  . Transaminitis 05/01/2019  . Diarrhea in adult patient 03/29/2019  . Educated about COVID-19 virus infection 11/15/2018  . Anemia 07/06/2018  . Chronic atrial fibrillation ([place]) 05/25/2018  . Neuropathic pain of hand 05/03/2018  . History of cardiac arrest 05/03/2018  . Cerebral infarction, watershed distribution, bilateral, remote, resolved 05/03/2018  . Chronic systolic heart failure ([place]) 04/26/2018  . Dysphagia, oropharyngeal 04/26/2018  . Chronic constipation 04/26/2018  . Adrenal insufficiency (Addison's disease) ([place]) 01/20/2018  . Hospital discharge follow-up 01/20/2018  . 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 CHB (complete heart block) ([place]) 08/17/2017  . Mobitz type 2 second degree AV block 08/09/2017  . Decreased GFR 05/18/2017  . Hyponatremia 11/01/2016  . Drug-induced vitamin B12 deficiency anemia 04/16/2016  . Type 2 diabetes mellitus with hyperglycemia, with long-term current use  of insulin ([place]) 08/21/2015  . Overweight (BMI 25.0-29.9) 10/07/2014  . Depression with anxiety 08/08/2014  . Encounter for preventive health examination 09/13/2013  . Hyperlipidemia associated with type 2 diabetes mellitus ([place]) 07/25/2013  . Insomnia 07/25/2013  . Hypothyroidism 03/29/2012  . Screening for breast cancer 03/29/2012  . Screening for colon cancer 03/29/2012  . Depression, recurrent ([place]) 08/13/2008  . GERD 08/13/2008  . Osteopenia 08/13/2008  . COLONIC POLYPS, HX OF 08/13/2008    Immunization History  Administered Date(s) Administered  . Fluad Quad(high Dose 65+) 06/12/2020  . H1N1 08/13/2008  . Influenza Split 07/28/2011, 06/28/2012  . Influenza, High Dose Seasonal PF 04/14/2016, 07/10/2017, 04/11/2018, 05/07/2019  . Influenza,inj,Quad PF,6+ Mos 07/25/2013, 08/21/2015  . Influenza-Unspecified 07/22/2015  . Moderna Sars-Covid-2 Vaccination 08/15/2019, 09/12/2019, 06/24/2020  . Pneumococcal Conjugate-13 09/13/2013  . Pneumococcal Polysaccharide-23 07/30/2011  . Zoster, Live 09/13/2013    Conditions to be addressed/monitored: HTN, HLD and DMII  Care Plan : Medication Management  Updates made by De Hollingshead, RPH-CPP since 12/09/2020 12:00 AM    Problem: Diabetes, Addison's Disease, Atrial Fibrillation, Osteoporosis     Long-Range Goal: Disease Progression Prevention   This Visit's Progress: On track  Recent Progress: On track  Priority: High  Note:   Current Barriers:  . Unable to independently monitor therapeutic efficacy . Unable to achieve control of diabetes  . Complex patient with multiple comorbidities, including diabetes, Addison's disease, atrial fibrillation, hx pulmonic valve replacement, osteoporosis  Pharmacist Clinical Goal(s):  Marland Kitchen Over the next 90 days, patient will verbalize ability to afford treatment regimen. . Over the next 90 days, patient will achieve adherence to monitoring guidelines and medication adherence to achieve  therapeutic efficacy. . Over the next 90 days, patient will achieve control of diabetes as evidenced by  improvement in A1c through collaboration with PharmD and provider.   Interventions: . 1:1 collaboration with [name], MD regarding development and update of comprehensive plan of care as evidenced by provider attestation and co-signature . Inter-disciplinary care team collaboration (see longitudinal plan of care) . Comprehensive medication review performed; medication list updated in electronic medical record  SDOH . Over income for patient assistance for [employer] ([place], [place], [place]), [employer] ([place]) or [place] ([place]). Denies cost concerns with medications at this time  Health Maintenance: . Up to date on COVID vaccinations.  Marland Kitchen PCP visit scheduled in July.  Diabetes: . Uncontrolled; current treatment: Ozempic 1 mg, Tresiba 18 units QPM, Jardiance 25 mg daily, Novolog 14 units with breakfast/ or lunch and 16 units with supper  . Patient sent MyChart message reporting low fasting blood sugars and asking how to treat o Hx metformin, but discontinued by endocrinology previously d/t elevated lactic acid and risk of lactic acidosis . Current glucose readings:  Date of Download: 5/18-12/09/20 % Time CGM is active: 61% (<70% reduces ability to accurately interpret) Average Glucose: 177 mg/dL  Glucose Management Indicator: not enough data to interpret Glucose Variability: 39.9% (goal <36%) Time in Goal:  - Time in range 70-180: 63%  - Time above range: 37%  - Time below range: 0% Observed patterns: [place] within target range, some lows. Elevated post prandials, higher after supper than breakfast.  . Decrease Tresiba to 16 units daily. Educated on Rule of 15 for treatment of hypoglycemia.   Atrial Fibrillation, HTN, hx pulmonic valve replacement . Appropriately managed, last clinic BP at goal; current treatment: metoprolol succinate 25 mg  daily, furosemide 20 mg daily . Anticoagulant regimen: Eliquis 5 mg BID . As noted above, over income for patient assistance.  . Previously recommended to continue current regimen at this time  Hyperlipidemia: . Uncontrolled per last lipid panel, though anticipated to improve given recent dose increase; current treatment: atorvastatin 80 mg daily  . Previously recommended to continue current regimen at this time. Due for repeat lipid panel with upcoming PCP appt  Addison's Disease . Appropriately managed; current treatment; hydrocortisone 10 mg QAM, 5 mg QPM; fludrocortisone 0.1 mg daily . Previously recommended to continue current regimen at this time with collaboration w/ PCP (patient declined continued endocrinology f/u).   Depression/anxiety/insomnia: . Appropriately managed per patient symptom report; current treatment: fluoxetine 10 mg daily, trazodone 150 mg QPM, mirtazapine 7.5 mg daily  . Previously recommended to continue current regimen at this time.  . Continue to monitor for s/sx serotonin syndrome given high serotonergic burden. Monitor weight gain, appetite w/ mirtazapine.   Hypothyroidism: . Controlled per last lab result; levothyroxine 100 mcg daily . Previously recommended to continue current regimen at this time  Osteoporosis: . Untreated; Will discuss treatment, including appropriate calcium + vitamin D and falls risk reduction moving forward. Encouraged to discuss w/ PCP at next appointment. Consider bisphosphonate vs Prolia.   Patient Goals/Self-Care Activities . Over the next 90 days, patient will:  - take medications as prescribed check blood glucose at least three times daily using CGM, document, and provide at future appointments  Follow Up Plan: Telephone follow up appointment with care management team member scheduled for:~ 4 weeks      Medication Assistance: None required.  Patient affirms current coverage meets needs.  Patient's preferred pharmacy  is:  [id number] [place] [place]Phone: [phone]: [phone]  Follow Up:  Patient agrees to Care Plan and Follow-up.  Plan: Telephone follow up appointment with care management team member scheduled for:  ~ 3 weeks as previously scheduled  Catie TDarnelle Maffucci PharmD, B[place] CBordenClinical Pharmacist LOcc[facility] [phone]

## 2020-12-09 NOTE — Patient Instructions (Signed)
Visit Information  PATIENT GOALS: Goals Addressed              This Visit's Progress     Patient Stated   .  Medication Management (pt-stated)        Patient Goals/Self-Care Activities . Over the next 90 days, patient will:  - take medications as prescribed check blood glucose three times daily using CGM, document, and provide at future appointments       Patient verbalizes understanding of instructions provided today and agrees to view in [place].   Plan: Telephone follow up appointment with care management team member scheduled for:  ~ 3 weeks as previously scheduled  Catie Darnelle Maffucci, PharmD, [place], [place] Clinical Pharmacist [employer] at [employer] [phone]

## 2020-12-09 NOTE — Telephone Encounter (Signed)
Patient was returning your call she stated that she forgot she had an appointment with you

## 2020-12-21 DIAGNOSIS — E119 Type 2 diabetes mellitus without complications: Secondary | ICD-10-CM | POA: Diagnosis not present

## 2020-12-21 LAB — HM DIABETES EYE EXAM

## 2020-12-22 ENCOUNTER — Ambulatory Visit (INDEPENDENT_AMBULATORY_CARE_PROVIDER_SITE_OTHER): Payer: HMO | Admitting: Pharmacist

## 2020-12-22 DIAGNOSIS — I5022 Chronic systolic (congestive) heart failure: Secondary | ICD-10-CM | POA: Diagnosis not present

## 2020-12-22 DIAGNOSIS — Z794 Long term (current) use of insulin: Secondary | ICD-10-CM

## 2020-12-22 DIAGNOSIS — F339 Major depressive disorder, recurrent, unspecified: Secondary | ICD-10-CM | POA: Diagnosis not present

## 2020-12-22 DIAGNOSIS — E1169 Type 2 diabetes mellitus with other specified complication: Secondary | ICD-10-CM | POA: Diagnosis not present

## 2020-12-22 DIAGNOSIS — E785 Hyperlipidemia, unspecified: Secondary | ICD-10-CM | POA: Diagnosis not present

## 2020-12-22 DIAGNOSIS — E271 Primary adrenocortical insufficiency: Secondary | ICD-10-CM

## 2020-12-22 DIAGNOSIS — E1165 Type 2 diabetes mellitus with hyperglycemia: Secondary | ICD-10-CM | POA: Diagnosis not present

## 2020-12-22 DIAGNOSIS — I482 Chronic atrial fibrillation, unspecified: Secondary | ICD-10-CM

## 2020-12-22 MED ORDER — TRESIBA FLEXTOUCH 200 UNIT/ML ~~LOC~~ SOPN: 16.0000 [IU] | PEN_INJECTOR | Freq: Every day | SUBCUTANEOUS | 2 refills | Status: DC

## 2020-12-22 MED ORDER — NOVOLOG FLEXPEN 100 UNIT/ML ~~LOC~~ SOPN: PEN_INJECTOR | SUBCUTANEOUS | 3 refills | Status: DC

## 2020-12-22 NOTE — Chronic Care Management (AMB) (Signed)
Chronic Care Management Pharmacy Note  12/22/2020 Name:  Haley Young MRN:  [id number] DOB:  30-Apr-1951  Subjective: Haley Young is an 70 y.o. year old female who is a primary patient of Derrel Nip, Aris Everts, MD.  The CCM team was consulted for assistance with disease management and care coordination needs.    Engaged with patient by telephone for follow up visit in response to provider referral for pharmacy case management and/or care coordination services.   Consent to Services:  The patient was given information about Chronic Care Management services, agreed to services, and gave verbal consent prior to initiation of services.  Please see initial visit note for detailed documentation.   Patient Care Team: [name], MD as PCP - General (Internal Medicine) De Hollingshead, RPH-CPP (Pharmacist)  Recent office visits: None since our last call  Recent consult visits: None since our last call  Hospital visits: None in previous 6 months  Objective:  Lab Results  Component Value Date   CREATININE 1.16 (H) 06/12/2020   CREATININE 1.17 (H) 03/20/2020   CREATININE 0.87 05/02/2019    Lab Results  Component Value Date   HGBA1C 13.2 (H) 06/12/2020   Last diabetic Eye exam:  Lab Results  Component Value Date/Time   HMDIABEYEEXA No Retinopathy 06/26/2019 12:00 AM    Last diabetic Foot exam:  Lab Results  Component Value Date/Time   HMDIABFOOTEX Normal 09/13/2013 12:00 AM        Component Value Date/Time   CHOL 182 06/12/2020 1625   TRIG 114 06/12/2020 1625   HDL 48 (L) 06/12/2020 1625   CHOLHDL 3.8 06/12/2020 1625   VLDL 18.0 05/02/2019 1102   LDLCALC 112 (H) 06/12/2020 1625   LDLDIRECT 165.0 04/14/2016 1012    Hepatic Function Latest Ref Rng & Units 06/12/2020 03/20/2020 05/02/2019  Total Protein 6.1 - 8.1 g/dL 7.3 7.3 6.7  Albumin 3.5 - 5.0 g/dL - 4.0 3.9  AST 10 - 35 U/L 36(H) 33 36  ALT 6 - 29 U/L 32(H) 25 28  Alk Phosphatase 38 - 126 U/L - 85 102   Total Bilirubin 0.2 - 1.2 mg/dL 0.5 1.0 0.4  Bilirubin, Direct 0.0 - 0.2 mg/dL - 0.2 -    Lab Results  Component Value Date/Time   TSH 4.112 03/20/2020 01:32 PM   TSH 0.76 05/02/2019 11:02 AM   TSH 0.29 (A) 08/30/2018 12:00 AM   TSH 3.54 01/17/2018 11:45 AM   FREET4 1.71 (H) 01/25/2016 11:04 AM   FREET4 1.19 08/21/2015 10:49 AM    CBC Latest Ref Rng & Units 03/20/2020 08/30/2018 06/29/2018  WBC 4.0 - 10.5 K/uL 13.0(H) 17.0 -  Hemoglobin 12.0 - 15.0 g/dL 15.6(H) - 15.4  Hematocrit 36.0 - 46.0 % 48.9(H) - -  Platelets 150 - 400 K/uL 164 - -    Lab Results  Component Value Date/Time   VD25OH 30.95 04/14/2016 10:12 AM   VD25OH 44 09/13/2013 09:34 AM   VD25OH 29 (L) 12/08/2009 08:42 PM    Clinical ASCVD: Yes  The ASCVD Risk score Mikey Bussing DC Jr., et al., 2013) failed to calculate for the following reasons:   The patient has a prior MI or stroke diagnosis      Social History   Tobacco Use  Smoking Status Never  Smokeless Tobacco Never   BP Readings from Last 3 Encounters:  06/12/20 102/64  03/20/20 (!) 143/65  03/20/20 (!) 82/58   Pulse Readings from Last 3 Encounters:  06/12/20 87  03/20/20 97  03/20/20 89   Wt Readings from Last 3 Encounters:  07/27/20 142 lb (64.4 kg)  07/15/20 136 lb (61.7 kg)  06/12/20 136 lb 6.4 oz (61.9 kg)    Assessment: Review of patient past medical history, allergies, medications, health status, including review of consultants reports, laboratory and other test data, was performed as part of comprehensive evaluation and provision of chronic care management services.   SDOH:  (Social Determinants of Health) assessments and interventions performed:  SDOH Interventions    Flowsheet Row Most Recent Value  SDOH Interventions   Financial Strain Interventions Intervention Not Indicated       CCM Care Plan  Allergies  Allergen Reactions   Oxycodone Hcl     REACTION: hallucinations   Albuterol Anxiety    Other reaction(s): Other  (See Comments) Tremors    Oxycodone Hcl Other (See Comments)    halucinations    Medications Reviewed Today     Reviewed by De Hollingshead, RPH-CPP (Pharmacist) on 12/22/20 at [phone]  Med List Status: <None>   Medication Order Taking? Sig Documenting Provider Last Dose Status Informant  apixaban (ELIQUIS) 5 MG TABS tablet [id number] Yes Take 1 tablet (5 mg total) by mouth 2 (two) times daily. [name], MD Taking Active   atorvastatin (LIPITOR) 80 MG tablet [id number] Yes TAKE 1 TABLET BY MOUTH EVERY DAY [name], MD Taking Active   Continuous Blood Gluc Receiver (FREESTYLE LIBRE 2 READER) [place] [id number]  Use to check glucose at least TID [name], MD  Active   Continuous Blood Gluc Sensor (FREESTYLE LIBRE 2 SENSOR) [place] [id number]  Use to check glucose at least TID [name], MD  Active   empagliflozin (JARDIANCE) 25 MG TABS tablet [id number] Yes Take 1 tablet (25 mg total) by mouth daily before breakfast. [name], MD Taking Active            Med Note De Hollingshead   Tue Dec 01, 2020  8:36 AM)    Fludrocortisone Acetate (FLORINEF PO) [id number] Yes Take 0.1 mg by mouth daily. [provider] Taking Active   FLUoxetine (PROZAC) 10 MG tablet [id number] Yes TAKE 1 TABLET BY MOUTH EVERY DAY [name], MD Taking Active   furosemide (LASIX) 20 MG tablet [id number] Yes Take 20 mg by mouth daily.  [provider] Taking Active Self  hydrocortisone (CORTEF) 10 MG tablet [id number] Yes Take 5 mg by mouth 2 (two) times daily. 10 mg QAM, 5 PM [provider] Taking Active Self  insulin aspart (NOVOLOG FLEXPEN) 100 UNIT/ML FlexPen [id number] Yes Inject 14 units with breakfast and lunch and 16 units with supper [name], MD Taking Active   insulin degludec (TRESIBA FLEXTOUCH) 200 UNIT/ML FlexTouch Pen [id number] Yes Inject 18 Units into the skin daily. [name], MD Taking Active            Med Note De Hollingshead   Tue  Dec 22, 2020  8:36 AM) 18 units  levothyroxine (SYNTHROID) 100 MCG tablet [id number] Yes Take 100 mcg by mouth daily. [provider] Taking Active   metoprolol succinate (TOPROL-XL) 25 MG 24 hr tablet [id number] Yes Take 25 mg by mouth daily. [provider] Taking Active   mirtazapine (REMERON) 7.5 MG tablet [id number] Yes TAKE 1 TABLET BY MOUTH EVERYDAY AT BEDTIME [name], MD Taking Active   Probiotic Product (PROBIOTIC ADVANCED PO) [id number] Yes Take 1 capsule by mouth daily. [provider] Taking Active   Semaglutide, 1 MG/DOSE, (OZEMPIC, 1 MG/DOSE,) 4 MG/3ML SOPN [id number] Yes Inject 1 mg into the skin once a week. [name], MD Taking Active   traZODone (DESYREL) 150 MG tablet [id number] Yes TAKE 1 TABLET (150 MG TOTAL) BY MOUTH AT BEDTIME. [name], MD Taking Active             Patient Active Problem List   Diagnosis Date Noted   Vaginitis 10/29/2020   Candida vaginitis 06/14/2020   Chronic left SI joint pain 05/01/2019   Transaminitis 05/01/2019   Diarrhea in adult patient 03/29/2019   Educated about COVID-19 virus infection 11/15/2018   Anemia 07/06/2018   Chronic atrial fibrillation ([place]) 05/25/2018   Neuropathic pain of hand 05/03/2018   History of cardiac arrest 05/03/2018   Cerebral infarction, watershed distribution, bilateral, remote, resolved 47/42/5956   Chronic systolic heart failure ([place]) 04/26/2018   Dysphagia, oropharyngeal 04/26/2018   Chronic constipation 04/26/2018   Adrenal insufficiency (Addison's disease) ([place]) 01/20/2018   Hospital discharge follow-up 01/20/2018   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   CHB (complete heart block) ([place]) 08/17/2017   Mobitz type 2 second degree AV block 08/09/2017   Decreased GFR 05/18/2017   Hyponatremia 11/01/2016   Drug-induced vitamin B12 deficiency anemia 04/16/2016   Type 2 diabetes mellitus with hyperglycemia, with long-term current use of insulin  ([place]) 08/21/2015   Overweight (BMI 25.0-29.9) 10/07/2014   Depression with anxiety 08/08/2014   Encounter for preventive health examination 09/13/2013   Hyperlipidemia associated with type 2 diabetes mellitus ([place]) 07/25/2013   Insomnia 07/25/2013   Hypothyroidism 03/29/2012   Screening for breast cancer 03/29/2012   Screening for colon cancer 03/29/2012   Depression, recurrent ([place]) 08/13/2008   GERD 08/13/2008   Osteopenia 08/13/2008   COLONIC POLYPS, HX OF 08/13/2008    Immunization History  Administered Date(s) Administered   Fluad Quad(high Dose 65+) 06/12/2020   H1N1 08/13/2008   Influenza Split 07/28/2011, 06/28/2012   Influenza, High Dose Seasonal PF 04/14/2016, 07/10/2017, 04/11/2018, 05/07/2019   Influenza,inj,Quad PF,6+ Mos 07/25/2013, 08/21/2015   Influenza-Unspecified 07/22/2015   Moderna Sars-Covid-2 Vaccination 08/15/2019, 09/12/2019, 06/24/2020   Pneumococcal Conjugate-13 09/13/2013   Pneumococcal Polysaccharide-23 07/30/2011   Zoster, Live 09/13/2013    Conditions to be addressed/monitored: CAD, HTN, HLD, and DMII  Care Plan : Medication Management  Updates made by De Hollingshead, RPH-CPP since 12/22/2020 12:00 AM     Problem: Diabetes, Addison's Disease, Atrial Fibrillation, Osteoporosis      Long-Range Goal: Disease Progression Prevention   This Visit's Progress: On track  Recent Progress: On track  Priority: High  Note:   Current Barriers:  Unable to independently monitor therapeutic efficacy Unable to achieve control of diabetes  Complex patient with multiple comorbidities, including diabetes, Addison's disease, atrial fibrillation, hx pulmonic valve replacement, osteoporosis  Pharmacist Clinical Goal(s):  Over the next 90 days, patient will verbalize ability to afford treatment regimen. Over the next 90 days, patient will achieve adherence to monitoring guidelines and medication adherence to achieve therapeutic efficacy. Over the next 90  days, patient will achieve control of diabetes as evidenced by  improvement in A1c through collaboration with PharmD and provider.   Interventions: 1:1 collaboration with [name], MD regarding development and update of comprehensive plan of care as evidenced by provider attestation and co-signature Inter-disciplinary care team collaboration (see longitudinal plan of care) Comprehensive medication review performed; medication list updated in electronic medical record  SDOH Over  income for patient assistance for [employer] ([place], [place], [place]), [employer] ([place]) or [place] ([place]). Denies cost concerns with medications at this time  Health Maintenance: COVID vaccinations x 3 PCP visit scheduled in July. Notes she had DM eye exam yesterday. She asked the office to fax results to our office.  Reviewed need for foot exam at upcoming appointment Discussed Shingrix vaccine. Recommended at her local pharmacy. Discussed Tdap vaccine. Recommended at her local pharmacy. Encouraged to discuss pneumonia vaccine with PCP at next visit.   Diabetes: Uncontrolled but improving; current treatment: Ozempic 1 mg, Tresiba 18 units QPM- advised to decrease to 16 units at last visit but patient did not do this; Jardiance 25 mg daily, Novolog 14 units with breakfast/ or lunch and 16 units with supper  Hx metformin, but discontinued by endocrinology previously d/t elevated lactic acid and risk of lactic acidosis Current glucose readings: using Libre 2 CGM Date of Download: 6/1-6/14/22 % Time CGM is active: 85% Average Glucose: 191 mg/dL  Glucose Management Indicator: 7.9% Glucose Variability: 36.5% (goal <36%) Time in Goal:  - Time in range 70-180: 52%  - Time above range: 37%  - Time below range: 1% Observed patterns: [place] within target range, some lows. Elevated post prandials, higher after supper than breakfast.  Decrease Tresiba to 16 units daily.  Increase supper Novolog to 18 units with supper. Continue 14 units with breakfast. Continue Ozempic 1 mg weekly, Jardiance 25 mg daily.  Praised for completing DM eye exam. Follow up with PCP at upcoming appointment.   Atrial Fibrillation, HTN, hx pulmonic valve replacement Appropriately managed, last clinic BP at goal; current treatment: metoprolol succinate 25 mg daily, furosemide 20 mg daily Anticoagulant regimen: Eliquis 5 mg BID Fill history up to date As noted above, over income for patient assistance.  Recommended to continue current regimen at this time. Follow up with PCP at upcoming appointment.   Hyperlipidemia: Uncontrolled per last lipid panel, though anticipated to improve given recent dose increase; current treatment: atorvastatin 80 mg daily  Fill history up to date. Previously recommended to continue current regimen at this time. Due for repeat lipid panel with upcoming PCP appt  Addison's Disease Appropriately managed; current treatment; hydrocortisone 10 mg QAM, 5 mg QPM; fludrocortisone 0.1 mg daily Previously recommended to continue current regimen at this time with collaboration w/ PCP (patient declined continued endocrinology f/u).   Depression/anxiety/insomnia: Appropriately managed per patient symptom report; current treatment: fluoxetine 10 mg daily, trazodone 150 mg QPM, mirtazapine 7.5 mg daily  Previously recommended to continue current regimen at this time.  Continue to monitor for s/sx serotonin syndrome given high serotonergic burden. Monitor weight gain, appetite w/ mirtazapine.   Hypothyroidism: Controlled per last lab result; levothyroxine 100 mcg daily Previously recommended to continue current regimen at this time  Osteoporosis: Untreated; Will discuss treatment, including appropriate calcium + vitamin D and falls risk reduction moving forward. Encouraged to discuss w/ PCP at next appointment. Consider bisphosphonate vs Prolia.   Patient  Goals/Self-Care Activities Over the next 90 days, patient will:  - take medications as prescribed check blood glucose at least three times daily using CGM, document, and provide at future appointments  Follow Up Plan: Telephone follow up appointment with care management team member scheduled for:~ 6 weeks      Medication Assistance: None required.  Patient affirms current coverage meets needs.  Patient's preferred pharmacy is:  [id number] [address]  [place] Phone: [phone] Fax: [phone]   Follow Up:  Patient agrees to Care Plan and Follow-up.   Plan: Telephone follow up appointment with care management team member scheduled for:  6 weeks  Catie Darnelle Maffucci, PharmD, [place], [place] Clinical Pharmacist [employer] at [employer] [phone]

## 2020-12-22 NOTE — Patient Instructions (Signed)
Visit Information  PATIENT GOALS:  Goals Addressed               This Visit's Progress     Patient Stated     Medication Management (pt-stated)        Patient Goals/Self-Care Activities Over the next 90 days, patient will:  - take medications as prescribed check blood glucose three times daily using CGM, document, and provide at future appointments          Patient verbalizes understanding of instructions provided today and agrees to view in [place].   Plan: Telephone follow up appointment with care management team member scheduled for:  6 weeks  Catie Darnelle Maffucci, PharmD, [place], [place] Clinical Pharmacist [employer] at [employer] [phone]

## 2020-12-29 DIAGNOSIS — I442 Atrioventricular block, complete: Secondary | ICD-10-CM | POA: Diagnosis not present

## 2020-12-29 DIAGNOSIS — Z950 Presence of cardiac pacemaker: Secondary | ICD-10-CM | POA: Diagnosis not present

## 2020-12-29 DIAGNOSIS — Z45018 Encounter for adjustment and management of other part of cardiac pacemaker: Secondary | ICD-10-CM | POA: Diagnosis not present

## 2020-12-29 DIAGNOSIS — I469 Cardiac arrest, cause unspecified: Secondary | ICD-10-CM | POA: Diagnosis not present

## 2021-01-01 DIAGNOSIS — Q221 Congenital pulmonary valve stenosis: Secondary | ICD-10-CM | POA: Diagnosis not present

## 2021-01-01 DIAGNOSIS — I371 Nonrheumatic pulmonary valve insufficiency: Secondary | ICD-10-CM | POA: Diagnosis not present

## 2021-01-01 DIAGNOSIS — I10 Essential (primary) hypertension: Secondary | ICD-10-CM | POA: Diagnosis not present

## 2021-01-13 ENCOUNTER — Encounter: Payer: Self-pay | Admitting: Internal Medicine

## 2021-01-13 ENCOUNTER — Ambulatory Visit (INDEPENDENT_AMBULATORY_CARE_PROVIDER_SITE_OTHER): Payer: HMO | Admitting: Internal Medicine

## 2021-01-13 ENCOUNTER — Other Ambulatory Visit: Payer: Self-pay

## 2021-01-13 VITALS — BP 112/72 | HR 76 | Temp 96.0°F | Resp 15 | Ht 61.0 in | Wt 149.2 lb

## 2021-01-13 DIAGNOSIS — E785 Hyperlipidemia, unspecified: Secondary | ICD-10-CM

## 2021-01-13 DIAGNOSIS — E1165 Type 2 diabetes mellitus with hyperglycemia: Secondary | ICD-10-CM | POA: Diagnosis not present

## 2021-01-13 DIAGNOSIS — F339 Major depressive disorder, recurrent, unspecified: Secondary | ICD-10-CM | POA: Diagnosis not present

## 2021-01-13 DIAGNOSIS — I5022 Chronic systolic (congestive) heart failure: Secondary | ICD-10-CM

## 2021-01-13 DIAGNOSIS — E1169 Type 2 diabetes mellitus with other specified complication: Secondary | ICD-10-CM | POA: Diagnosis not present

## 2021-01-13 DIAGNOSIS — N1831 Chronic kidney disease, stage 3a: Secondary | ICD-10-CM | POA: Diagnosis not present

## 2021-01-13 DIAGNOSIS — Z7189 Other specified counseling: Secondary | ICD-10-CM

## 2021-01-13 DIAGNOSIS — E271 Primary adrenocortical insufficiency: Secondary | ICD-10-CM

## 2021-01-13 DIAGNOSIS — Z794 Long term (current) use of insulin: Secondary | ICD-10-CM | POA: Diagnosis not present

## 2021-01-13 DIAGNOSIS — Z23 Encounter for immunization: Secondary | ICD-10-CM | POA: Diagnosis not present

## 2021-01-13 DIAGNOSIS — E538 Deficiency of other specified B group vitamins: Secondary | ICD-10-CM

## 2021-01-13 DIAGNOSIS — E559 Vitamin D deficiency, unspecified: Secondary | ICD-10-CM | POA: Diagnosis not present

## 2021-01-13 LAB — CBC WITH DIFFERENTIAL/PLATELET
Basophils Absolute: 0.1 10*3/uL (ref 0.0–0.1)
Basophils Relative: 0.7 % (ref 0.0–3.0)
Eosinophils Absolute: 0.3 10*3/uL (ref 0.0–0.7)
Eosinophils Relative: 3.8 % (ref 0.0–5.0)
HCT: 44.6 % (ref 36.0–46.0)
Hemoglobin: 14.6 g/dL (ref 12.0–15.0)
Lymphocytes Relative: 24.8 % (ref 12.0–46.0)
Lymphs Abs: 2.1 10*3/uL (ref 0.7–4.0)
MCHC: 32.7 g/dL (ref 30.0–36.0)
MCV: 84.3 fl (ref 78.0–100.0)
Monocytes Absolute: 0.7 10*3/uL (ref 0.1–1.0)
Monocytes Relative: 8.7 % (ref 3.0–12.0)
Neutro Abs: 5.2 10*3/uL (ref 1.4–7.7)
Neutrophils Relative %: 62 % (ref 43.0–77.0)
Platelets: 143 10*3/uL — ABNORMAL LOW (ref 150.0–400.0)
RBC: 5.29 Mil/uL — ABNORMAL HIGH (ref 3.87–5.11)
RDW: 14.4 % (ref 11.5–15.5)
WBC: 8.4 10*3/uL (ref 4.0–10.5)

## 2021-01-13 LAB — COMPREHENSIVE METABOLIC PANEL
ALT: 32 U/L (ref 0–35)
AST: 38 U/L — ABNORMAL HIGH (ref 0–37)
Albumin: 3.9 g/dL (ref 3.5–5.2)
Alkaline Phosphatase: 148 U/L — ABNORMAL HIGH (ref 39–117)
BUN: 25 mg/dL — ABNORMAL HIGH (ref 6–23)
CO2: 32 mEq/L (ref 19–32)
Calcium: 9.4 mg/dL (ref 8.4–10.5)
Chloride: 101 mEq/L (ref 96–112)
Creatinine, Ser: 1.18 mg/dL (ref 0.40–1.20)
GFR: 46.16 mL/min — ABNORMAL LOW (ref >60.00)
Glucose, Bld: 184 mg/dL — ABNORMAL HIGH (ref 70–99)
Potassium: 3.9 mEq/L (ref 3.5–5.1)
Sodium: 139 mEq/L (ref 135–145)
Total Bilirubin: 0.5 mg/dL (ref 0.2–1.2)
Total Protein: 6.8 g/dL (ref 6.0–8.3)

## 2021-01-13 LAB — LIPID PANEL
Cholesterol: 129 mg/dL (ref 0–200)
HDL: 50.8 mg/dL (ref >39.00)
LDL Cholesterol: 60 mg/dL (ref 0–99)
NonHDL: 78.19
Total CHOL/HDL Ratio: 3
Triglycerides: 90 mg/dL (ref 0.0–149.0)
VLDL: 18 mg/dL (ref 0.0–40.0)

## 2021-01-13 LAB — VITAMIN D 25 HYDROXY (VIT D DEFICIENCY, FRACTURES): VITD: 15.16 ng/mL — ABNORMAL LOW (ref 30.00–100.00)

## 2021-01-13 LAB — VITAMIN B12: Vitamin B-12: 336 pg/mL (ref 211–911)

## 2021-01-13 LAB — HEMOGLOBIN A1C: Hgb A1c MFr Bld: 9.2 % — ABNORMAL HIGH (ref 4.6–6.5)

## 2021-01-13 NOTE — Patient Instructions (Addendum)
YOU RECEIVED THE PREVNAR 20 VACCINE TODAY FOR PNEUMONIA PREVENTION  YOU STILL NEED A TETANUS BOOSTER;  GET IT AT YOUR PHARMACY  YOU CAN ADD 100 MG COLACE  DAILY TO SOFTEN STOOLS;   OK TO COMBINE WITH FIBER  FOR THE CONSTIPATION   B12 LEVEL TODAY; YOU MAY NEED RESUME INJECTIONS    YOU NEED TO START A WALKING PROGRAM!  FOR YOUR DIABETES AND YOUR HEART.  START WITH 5 MINUTES AND GRADUALLY WORK YOUR WAY UP TO 20 MINUTES    I NEED TO  REPEAT YOUR a1C EVERY 3 MONTHS UNTIL IT IS AT GOAL OF 7.0 OR LESS   REFERRAL TO DR CLINE AT [facility]

## 2021-01-13 NOTE — Progress Notes (Signed)
Subjective:  Patient ID: Haley Young, female    DOB: 1951-05-19  Age: 70 y.o. MRN: [id number]  CC: The primary encounter diagnosis was B12 deficiency. Diagnoses of Vitamin D deficiency, Educated about COVID-19 virus infection, Hyperlipidemia associated with type 2 diabetes mellitus ([employer]), COVID-19 vaccine administered, Need for pneumococcal vaccination, Adrenal insufficiency (Addison's disease) ([place]), Type 2 diabetes mellitus with hyperglycemia, with long-term current use of insulin ([place]), Stage 3a chronic kidney disease ([place]), Depression, recurrent ([place]), and Chronic systolic heart failure ([place]) were also pertinent to this visit.  HPI: Haley Young presents for follow up on chronic conditions, including type 2 DM, chronic atrial fibrillation,  adrenal insufficiency, and CHF   This visit occurred during the SARS-CoV-2 public health emergency.  Safety protocols were in place, including screening questions prior to the visit, additional usage of staff PPE, and extensive cleaning of exam room while observing appropriate contact time as indicated for disinfecting solutions.   She feels generally well,  is staying away from crowds because of husband Leland's fear of  her becoming infected with COVID  nonfrdcr Cc:  she has been struggling with constipation .  Stools have been very large and often result in toilet obstruction .   2) follow up on uncontrolled type 2  2 DM :  USING FREE STYLE LIBRE    POST PRANDIAL BREAKFAST SUGAR  WAS 198 AFTER A NONDESCRIPT  COMMERICAL " BREAKFAST BAR "  (NOT A LOW CARB  BAR)  .  NO LONGER HAVING  FREQUENT HYPOGLYCEMIC EPISODES DURING THE EARLY MORNING HOURS FOR THE PAST WEEK , USING 14 UNITS OF NOVOLOG AT BFAST AND LUNCH , AND  16 UNITS AT DINNER.  REDUCED TRESIBA DOSE AT BEDTIME TO 18 UNITS  NOT EXERCISING  3) Chronic atrial fib:  denies SOB at rest but admits she has not been walking daily.  Tolerating eliquis without bleeding   4) MDD:  she denies anhedonia,  insomnia and hopelessness.  Not suidical.  Taking prozac.   Lab Results  Component Value Date   HGBA1C 9.2 (H) 01/13/2021        Outpatient Medications Prior to Visit  Medication Sig Dispense Refill   apixaban (ELIQUIS) 5 MG TABS tablet Take 1 tablet (5 mg total) by mouth 2 (two) times daily. 60 tablet 11   atorvastatin (LIPITOR) 80 MG tablet TAKE 1 TABLET BY MOUTH EVERY DAY 90 tablet 1   Continuous Blood Gluc Receiver (FREESTYLE LIBRE 2 READER) DEVI Use to check glucose at least TID 1 each 1   Continuous Blood Gluc Sensor (FREESTYLE LIBRE 2 SENSOR) MISC Use to check glucose at least TID 2 each 11   empagliflozin (JARDIANCE) 25 MG TABS tablet Take 1 tablet (25 mg total) by mouth daily before breakfast. 90 tablet 3   Fludrocortisone Acetate (FLORINEF PO) Take 0.1 mg by mouth daily.     FLUoxetine (PROZAC) 10 MG tablet TAKE 1 TABLET BY MOUTH EVERY DAY 90 tablet 1   furosemide (LASIX) 20 MG tablet Take 20 mg by mouth daily.      hydrocortisone (CORTEF) 10 MG tablet Take 5 mg by mouth 2 (two) times daily. 10 mg QAM, 5 PM     insulin aspart (NOVOLOG FLEXPEN) 100 UNIT/ML FlexPen Inject 14 units with breakfast and/or lunch and 18 units with supper 30 mL 3   insulin degludec (TRESIBA FLEXTOUCH) 200 UNIT/ML FlexTouch Pen Inject 16 Units into the skin daily. 9 mL 2   levothyroxine (SYNTHROID) 100 MCG tablet Take  100 mcg by mouth daily.     metoprolol succinate (TOPROL-XL) 25 MG 24 hr tablet Take 25 mg by mouth daily.     mirtazapine (REMERON) 7.5 MG tablet TAKE 1 TABLET BY MOUTH EVERYDAY AT BEDTIME 90 tablet 1   Probiotic Product (PROBIOTIC ADVANCED PO) Take 1 capsule by mouth daily.     Semaglutide, 1 MG/DOSE, (OZEMPIC, 1 MG/DOSE,) 4 MG/3ML SOPN Inject 1 mg into the skin once a week. 9 mL 1   traZODone (DESYREL) 150 MG tablet TAKE 1 TABLET (150 MG TOTAL) BY MOUTH AT BEDTIME. 90 tablet 1   No facility-administered medications prior to visit.    Review of Systems;  Patient denies headache,  fevers, malaise, unintentional weight loss, skin rash, eye pain, sinus congestion and sinus pain, sore throat, dysphagia,  hemoptysis , cough, dyspnea, wheezing, chest pain, palpitations, orthopnea, edema, abdominal pain, nausea, melena, diarrhea, constipation, flank pain, dysuria, hematuria, urinary  Frequency, nocturia, numbness, tingling, seizures,  Focal weakness, Loss of consciousness,  Tremor, insomnia, depression, anxiety, and suicidal ideation.      Objective:  BP 112/72 (BP Location: Left Arm, Patient Position: Sitting, Cuff Size: Normal)   Pulse 76   Temp (!) 96 F (35.6 C) (Temporal)   Resp 15   Ht 5\' 1"  (1.549 m)   Wt 149 lb 3.2 oz (67.7 kg)   SpO2 96%   BMI 28.19 kg/m   BP Readings from Last 3 Encounters:  01/13/21 112/72  06/12/20 102/64  03/20/20 (!) 143/65    Wt Readings from Last 3 Encounters:  01/13/21 149 lb 3.2 oz (67.7 kg)  07/27/20 142 lb (64.4 kg)  07/15/20 136 lb (61.7 kg)    General appearance: alert, cooperative and appears stated age Ears: normal TM's and external ear canals both ears Throat: lips, mucosa, and tongue normal; teeth and gums normal Neck: no adenopathy, no carotid bruit, supple, symmetrical, trachea midline and thyroid not enlarged, symmetric, no tenderness/mass/nodules Back: symmetric, no curvature. ROM normal. No CVA tenderness. Lungs: clear to auscultation bilaterally Heart: regular rate and rhythm, S1, S2 normal, no murmur, click, rub or gallop Abdomen: soft, non-tender; bowel sounds normal; no masses,  no organomegaly Pulses: 2+ and symmetric Skin: Skin  is notable of multipel scattered purpura on arms ndd legs.  But not on torso.  texture, turgor normal. Lymph nodes: Cervical, supraclavicular, and axillary nodes normal.  Lab Results  Component Value Date   HGBA1C 9.2 (H) 01/13/2021   HGBA1C 13.2 (H) 06/12/2020   HGBA1C 7.8 (H) 05/02/2019    Lab Results  Component Value Date   CREATININE 1.18 01/13/2021   CREATININE 1.16  (H) 06/12/2020   CREATININE 1.17 (H) 03/20/2020    Lab Results  Component Value Date   WBC 8.4 01/13/2021   HGB 14.6 01/13/2021   HCT 44.6 01/13/2021   PLT 143.0 (L) 01/13/2021   GLUCOSE 184 (H) 01/13/2021   CHOL 129 01/13/2021   TRIG 90.0 01/13/2021   HDL 50.80 01/13/2021   LDLDIRECT 165.0 04/14/2016   LDLCALC 60 01/13/2021   ALT 32 01/13/2021   AST 38 (H) 01/13/2021   NA 139 01/13/2021   K 3.9 01/13/2021   CL 101 01/13/2021   CREATININE 1.18 01/13/2021   BUN 25 (H) 01/13/2021   CO2 32 01/13/2021   TSH 4.112 03/20/2020   INR 1.5 (H) 03/20/2020   HGBA1C 9.2 (H) 01/13/2021   MICROALBUR 0.5 06/12/2020    CT Head Wo Contrast  Result Date: 03/20/2020 CLINICAL DATA:  Hemorrhage suspected.  Headache with vomiting. EXAM: CT HEAD WITHOUT CONTRAST TECHNIQUE: Contiguous axial images were obtained from the base of the skull through the vertex without intravenous contrast. COMPARISON:  CT head 08/14/2010 FINDINGS: Brain: No evidence of acute large vascular territory infarct. Similar encephalomalacia involving the high left parietal lobe. Small remote lacunar infarct in left cerebellum. No acute hemorrhage. No hydrocephalus Vascular: Calcific atherosclerosis. No hyperdense vessel identified. Skull: Normal. Negative for fracture or focal lesion. Sinuses/Orbits: No acute finding. Other: No mastoid effusion. IMPRESSION: 1. No acute intracranial abnormality. 2. Similar left parietal encephalomalacia and remote left cerebellar lacunar infarct. Electronically Signed   By: Margaretha Sheffield MD   On: 03/20/2020 14:36   DG Chest Port 1 View  Result Date: 03/20/2020 CLINICAL DATA:  70 year old female with possible sepsis. Hypotension. History of pulmonic valve replacement. EXAM: PORTABLE CHEST 1 VIEW COMPARISON:  Chest radiographs 08/17/2017 and earlier. FINDINGS: Portable AP view at 1350 hours. Stable left chest 3 lead pacemaker. Revision of cardiac valve replacement since 2019. Stable cardiac size and  mediastinal contours. Visualized tracheal air column is within normal limits. No pneumothorax, pulmonary edema, pleural effusion or acute pulmonary opacity. No acute osseous abnormality identified. Negative visible bowel gas pattern. IMPRESSION: No acute cardiopulmonary abnormality. Electronically Signed   By: Genevie Ann M.D.   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   Continue hydrocortisone twice daily with stress dosing instructions reviewed.  continue florinef as well. Evaristo Bury are normal anc Cr is stable  Lab Results  Component Value Date   CREATININE 1.18 01/13/2021   Lab Results  Component Value Date   NA 139 01/13/2021   K 3.9 01/13/2021   CL 101 01/13/2021   CO2 32 01/13/2021          Chronic systolic heart failure ([place])    She has no evidence of systolic dysfunction but is taking lasix daily.  Given elevated CR /low GFR since 2020 will recommend follow up with her cardiolgist regarding changing use of diuretic to prn weight gain of 2 lbs overnight or 5 lbs.week        CKD (chronic kidney disease) stage 3, GFR 30-59 ml/min ([facility])    GFR has been < 50 ml/min since Oct 2020 .she is not taking NSAIDS  But is taking a daily diuretic due to diagnosis of CHF with systolic dysfunction  Referral to nephrology advised  Lab Results  Component Value Date   CREATININE 1.18 01/13/2021   Lab Results  Component Value Date   MICROALBUR 0.5 06/12/2020   MICROALBUR 3.0 (H) 05/02/2019             Depression, recurrent ([place])    Symptoms of anorexia  have improved with remeron, but she continues to have fatigue that is related to her heart condition.  She does not nee to gain weight so will not increase remeron.  She denies increased depressive symptoms on low dose prozac.    No changes today        Educated about COVID-19 virus  infection   Hyperlipidemia associated with type 2 diabetes mellitus ([place])   Relevant Orders   Hemoglobin A1c (Completed)   Comprehensive metabolic panel (Completed)   Lipid panel (Completed)   Type 2 diabetes mellitus with hyperglycemia, with long-term current use of insulin ([place])  Uncontrolled by last a1c of 13.  Patient has been using a CBG monitor and is taking novolog 14/14/16 qac,   Tresiba 18 units   And jardiance . Fasting sugars are elevated.   A1c has improved but is not at goal.  ased on description of glycemic log (which had not been downloaded prior to visit) ,  I will increase her breakfast dose of Novolog to16 units and have her   follow up with Catie Darnelle Maffucci PhD for fine tuning of insulin regimen. counselled patient on the importance of daily exercise to reduce insulin resistance and maintain conditioning. Eye exam is up to date.   Lab Results  Component Value Date   HGBA1C 9.2 (H) 01/13/2021          Other Visit Diagnoses     B12 deficiency    -  Primary   Relevant Orders   CBC with Differential/Platelet (Completed)   Vitamin B12 (Completed)   Intrinsic Factor Antibodies (Completed)   Vitamin D deficiency       Relevant Orders   VITAMIN D 25 Hydroxy (Vit-D Deficiency, Fractures) (Completed)   COVID-19 vaccine administered       Relevant Orders   SARS-CoV-2 Semi-Quantitative Total Antibody, Spike   Need for pneumococcal vaccination       Relevant Orders   Pneumococcal conjugate vaccine 20-valent (Prevnar 20) (Completed)     A total of 40 minutes was spent  on the day of patient's visit,  more than half of which was spent in counseling patient on the above mentioned issues , rthe rest in reviewing and previous visits with specialists , past labs and imaging studies done, and coordination of care.   I am having Haley Young maintain her hydrocortisone, furosemide, apixaban, Fludrocortisone Acetate (FLORINEF PO), metoprolol succinate, levothyroxine, Probiotic  Product (PROBIOTIC ADVANCED PO), FreeStyle Libre 2 Sensor, FreeStyle Libre 2 Reader, FLUoxetine, traZODone, atorvastatin, empagliflozin, Ozempic (1 MG/DOSE), mirtazapine, NovoLOG FlexPen, and [employer].  No orders of the defined types were placed in this encounter.   There are no discontinued medications.  Follow-up: Return in about 3 months (around 04/15/2021).   [name], MD

## 2021-01-15 LAB — INTRINSIC FACTOR ANTIBODIES: Intrinsic Factor: NEGATIVE

## 2021-01-16 DIAGNOSIS — N183 Chronic kidney disease, stage 3 unspecified: Secondary | ICD-10-CM | POA: Insufficient documentation

## 2021-01-16 NOTE — Assessment & Plan Note (Signed)
She has no evidence of systolic dysfunction but is taking lasix daily.  Given elevated CR /low GFR since 2020 will recommend follow up with her cardiolgist regarding changing use of diuretic to prn weight gain of 2 lbs overnight or 5 lbs.week

## 2021-01-16 NOTE — Assessment & Plan Note (Addendum)
Symptoms of anorexia  have improved with remeron, but she continues to have fatigue that is related to her heart condition.  She does not nee to gain weight so will not increase remeron.  She denies increased depressive symptoms on low dose prozac.    No changes today

## 2021-01-16 NOTE — Assessment & Plan Note (Addendum)
Uncontrolled by last a1c of 13.  Patient has been using a CBG monitor and is taking novolog 14/14/16 qac,   Tresiba 18 units   And jardiance . Fasting sugars are elevated.   A1c has improved but is not at goal.  ased on description of glycemic log (which had not been downloaded prior to visit) ,  I will increase her breakfast dose of Novolog to16 units and have her   follow up with Catie Darnelle Maffucci PhD for fine tuning of insulin regimen. counselled patient on the importance of daily exercise to reduce insulin resistance and maintain conditioning. Eye exam is up to date.   Lab Results  Component Value Date   HGBA1C 9.2 (H) 01/13/2021

## 2021-01-16 NOTE — Assessment & Plan Note (Addendum)
GFR has been < 50 ml/min since Oct 2020 .she is not taking NSAIDS  But is taking a daily diuretic due to diagnosis of CHF with systolic dysfunction  Referral to nephrology advised  Lab Results  Component Value Date   CREATININE 1.18 01/13/2021   Lab Results  Component Value Date   MICROALBUR 0.5 06/12/2020   MICROALBUR 3.0 (H) 05/02/2019

## 2021-01-16 NOTE — Assessment & Plan Note (Signed)
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   Continue hydrocortisone twice daily with stress dosing instructions reviewed.  continue florinef as well. Evaristo Bury are normal anc Cr is stable  Lab Results  Component Value Date   CREATININE 1.18 01/13/2021   Lab Results  Component Value Date   NA 139 01/13/2021   K 3.9 01/13/2021   CL 101 01/13/2021   CO2 32 01/13/2021

## 2021-01-17 LAB — SARS-COV-2 SEMI-QUANTITATIVE TOTAL ANTIBODY, SPIKE: SARS COV2 AB, Total Spike Semi QN: >2500 U/mL — ABNORMAL HIGH (ref <0.8)

## 2021-02-17 ENCOUNTER — Other Ambulatory Visit: Payer: Self-pay | Admitting: Internal Medicine

## 2021-02-23 ENCOUNTER — Ambulatory Visit (INDEPENDENT_AMBULATORY_CARE_PROVIDER_SITE_OTHER): Payer: HMO | Admitting: Pharmacist

## 2021-02-23 ENCOUNTER — Telehealth: Payer: Self-pay | Admitting: Internal Medicine

## 2021-02-23 DIAGNOSIS — I5022 Chronic systolic (congestive) heart failure: Secondary | ICD-10-CM

## 2021-02-23 DIAGNOSIS — E559 Vitamin D deficiency, unspecified: Secondary | ICD-10-CM

## 2021-02-23 DIAGNOSIS — E1165 Type 2 diabetes mellitus with hyperglycemia: Secondary | ICD-10-CM

## 2021-02-23 DIAGNOSIS — Z794 Long term (current) use of insulin: Secondary | ICD-10-CM

## 2021-02-23 DIAGNOSIS — F339 Major depressive disorder, recurrent, unspecified: Secondary | ICD-10-CM

## 2021-02-23 DIAGNOSIS — E785 Hyperlipidemia, unspecified: Secondary | ICD-10-CM | POA: Diagnosis not present

## 2021-02-23 DIAGNOSIS — N1831 Chronic kidney disease, stage 3a: Secondary | ICD-10-CM | POA: Diagnosis not present

## 2021-02-23 DIAGNOSIS — E1169 Type 2 diabetes mellitus with other specified complication: Secondary | ICD-10-CM | POA: Diagnosis not present

## 2021-02-23 MED ORDER — ERGOCALCIFEROL 1.25 MG (50000 UT) PO CAPS: 50000.0000 [IU] | ORAL_CAPSULE | ORAL | 5 refills | Status: DC

## 2021-02-23 MED ORDER — NOVOLOG FLEXPEN 100 UNIT/ML ~~LOC~~ SOPN: PEN_INJECTOR | SUBCUTANEOUS | 3 refills | Status: DC

## 2021-02-23 NOTE — Telephone Encounter (Signed)
My Chart message sent

## 2021-02-23 NOTE — Chronic Care Management (AMB) (Signed)
Chronic Care Management Pharmacy Note  02/23/2021 Name:  Haley Young MRN:  [id number] DOB:  1950/12/22  Subjective: Haley Young is an 70 y.o. year old female who is a primary patient of Derrel Nip, Aris Everts, MD.  The CCM team was consulted for assistance with disease management and care coordination needs.    Engaged with patient by telephone for follow up visit in response to provider referral for pharmacy case management and/or care coordination services.   Consent to Services:  The patient was given information about Chronic Care Management services, agreed to services, and gave verbal consent prior to initiation of services.  Please see initial visit note for detailed documentation.   Patient Care Team: [name], MD as PCP - General (Internal Medicine) De Hollingshead, RPH-CPP (Pharmacist)  Recent office visits: 7/6 - PCP - A1c 9.2%, increased breakfast Novolog to 16 units; recommended to reduce furosemide to PRN, avoid NSAIDs, consider nephrology referral, low Vitamin D level, LDL 60, eGFR 46  Recent consult visits: 6/24 - [facility] - continue current regimen  Hospital visits: None in previous 6 months  Objective:  Lab Results  Component Value Date   CREATININE 1.18 01/13/2021   CREATININE 1.16 (H) 06/12/2020   CREATININE 1.17 (H) 03/20/2020    Lab Results  Component Value Date   HGBA1C 9.2 (H) 01/13/2021   Last diabetic Eye exam:  Lab Results  Component Value Date/Time   HMDIABEYEEXA No Retinopathy 12/21/2020 12:00 AM    Last diabetic Foot exam:  Lab Results  Component Value Date/Time   HMDIABFOOTEX Normal 09/13/2013 12:00 AM        Component Value Date/Time   CHOL 129 01/13/2021 1055   TRIG 90.0 01/13/2021 1055   HDL 50.80 01/13/2021 1055   CHOLHDL 3 01/13/2021 1055   VLDL 18.0 01/13/2021 1055   LDLCALC 60 01/13/2021 1055   LDLCALC 112 (H) 06/12/2020 1625   LDLDIRECT 165.0 04/14/2016 1012    Hepatic Function Latest Ref Rng &  Units 01/13/2021 06/12/2020 03/20/2020  Total Protein 6.0 - 8.3 g/dL 6.8 7.3 7.3  Albumin 3.5 - 5.2 g/dL 3.9 - 4.0  AST 0 - 37 U/L 38(H) 36(H) 33  ALT 0 - 35 U/L 32 32(H) 25  Alk Phosphatase 39 - 117 U/L 148(H) - 85  Total Bilirubin 0.2 - 1.2 mg/dL 0.5 0.5 1.0  Bilirubin, Direct 0.0 - 0.2 mg/dL - - 0.2    Lab Results  Component Value Date/Time   TSH 4.112 03/20/2020 01:32 PM   TSH 0.76 05/02/2019 11:02 AM   TSH 0.29 (A) 08/30/2018 12:00 AM   TSH 3.54 01/17/2018 11:45 AM   FREET4 1.71 (H) 01/25/2016 11:04 AM   FREET4 1.19 08/21/2015 10:49 AM    CBC Latest Ref Rng & Units 01/13/2021 03/20/2020 08/30/2018  WBC 4.0 - 10.5 K/uL 8.4 13.0(H) 17.0  Hemoglobin 12.0 - 15.0 g/dL 14.6 15.6(H) -  Hematocrit 36.0 - 46.0 % 44.6 48.9(H) -  Platelets 150.0 - 400.0 K/uL 143.0(L) 164 -    Lab Results  Component Value Date/Time   VD25OH 15.16 (L) 01/13/2021 10:55 AM   VD25OH 30.95 04/14/2016 10:12 AM    Clinical ASCVD: Yes  The ASCVD Risk score Mikey Bussing DC Jr., et al., 2013) failed to calculate for the following reasons:   The patient has a prior MI or stroke diagnosis     CHA2DS2-VASc Score = 7  This indicates a 11.2% annual risk of stroke. The patient's score is based  upon: CHF History: Yes HTN History: Yes Diabetes History: Yes Stroke History: Yes Vascular Disease History: No Age Score: 1 Gender Score: 1    Social History   Tobacco Use  Smoking Status Never  Smokeless Tobacco Never   BP Readings from Last 3 Encounters:  01/13/21 112/72  06/12/20 102/64  03/20/20 (!) 143/65   Pulse Readings from Last 3 Encounters:  01/13/21 76  06/12/20 87  03/20/20 97   Wt Readings from Last 3 Encounters:  01/13/21 149 lb 3.2 oz (67.7 kg)  07/27/20 142 lb (64.4 kg)  07/15/20 136 lb (61.7 kg)    Assessment: Review of patient past medical history, allergies, medications, health status, including review of consultants reports, laboratory and other test data, was performed as part of  comprehensive evaluation and provision of chronic care management services.   SDOH:  (Social Determinants of Health) assessments and interventions performed:  SDOH Interventions    Flowsheet Row Most Recent Value  SDOH Interventions   Financial Strain Interventions Intervention Not Indicated       CCM Care Plan  Allergies  Allergen Reactions   Oxycodone Hcl     REACTION: hallucinations   Albuterol Anxiety    Other reaction(s): Other (See Comments) Tremors    Oxycodone Hcl Other (See Comments)    halucinations    Medications Reviewed Today     Reviewed by De Hollingshead, RPH-CPP (Pharmacist) on 02/23/21 at 1000  Med List Status: <None>   Medication Order Taking? Sig Documenting Provider Last Dose Status Informant  apixaban (ELIQUIS) 5 MG TABS tablet [id number] Yes Take 1 tablet (5 mg total) by mouth 2 (two) times daily. [name], MD Taking Active   atorvastatin (LIPITOR) 80 MG tablet [id number] Yes TAKE 1 TABLET BY MOUTH EVERY DAY [name], MD Taking Active   Continuous Blood Gluc Receiver (FREESTYLE LIBRE 2 READER) [place] [id number]  Use to check glucose at least TID [name], MD  Active   Continuous Blood Gluc Sensor (FREESTYLE LIBRE 2 SENSOR) [place] [id number]  Use to check glucose at least TID [name], MD  Active   empagliflozin (JARDIANCE) 25 MG TABS tablet [id number] Yes Take 1 tablet (25 mg total) by mouth daily before breakfast. [name], MD Taking Active            Med Note De Hollingshead   Tue Dec 01, 2020  8:36 AM)    Fludrocortisone Acetate (FLORINEF PO) [id number] Yes Take 0.1 mg by mouth daily. [provider] Taking Active   FLUoxetine (PROZAC) 10 MG tablet [id number] Yes TAKE 1 TABLET BY MOUTH EVERY DAY [name], MD Taking Active   furosemide (LASIX) 20 MG tablet [id number] Yes Take 20 mg by mouth as needed. [provider] Taking Active Self  hydrocortisone (CORTEF) 10 MG tablet [id number] Yes Take 5 mg  by mouth 2 (two) times daily. 10 mg QAM, 5 PM [provider] Taking Active Self  insulin aspart (NOVOLOG FLEXPEN) 100 UNIT/ML FlexPen [id number] Yes Inject 14 units with breakfast and/or lunch and 18 units with supper [name], MD Taking Active   insulin degludec (TRESIBA FLEXTOUCH) 200 UNIT/ML FlexTouch Pen [id number] Yes Inject 16 Units into the skin daily. [name], MD Taking Active   levothyroxine (SYNTHROID) 100 MCG tablet [id number] Yes Take 100 mcg by mouth daily. [provider] Taking Active   metoprolol succinate (TOPROL-XL) 25 MG 24 hr tablet [id number] Yes Take 25 mg by mouth daily.  [provider] Taking Active   mirtazapine (REMERON) 7.5 MG tablet [id number] Yes TAKE 1 TABLET BY MOUTH EVERYDAY AT BEDTIME [name], MD Taking Active   Probiotic Product (PROBIOTIC ADVANCED PO) [id number] Yes Take 1 capsule by mouth daily. [provider] Taking Active   Semaglutide, 1 MG/DOSE, (OZEMPIC, 1 MG/DOSE,) 4 MG/3ML SOPN [id number] Yes Inject 1 mg into the skin once a week. [name], MD Taking Active   traZODone (DESYREL) 150 MG tablet [id number] Yes TAKE 1 TABLET (150 MG TOTAL) BY MOUTH AT BEDTIME. [name], MD Taking Active             Patient Active Problem List   Diagnosis Date Noted   CKD (chronic kidney disease) stage 3, GFR 30-59 ml/min (HCC) 01/16/2021   Chronic left SI joint pain 05/01/2019   Transaminitis 05/01/2019   Educated about COVID-19 virus infection 11/15/2018   Anemia 07/06/2018   Chronic atrial fibrillation ([place]) 05/25/2018   Neuropathic pain of hand 05/03/2018   History of cardiac arrest 05/03/2018   Cerebral infarction, watershed distribution, bilateral, remote, resolved 24/40/1027   Chronic systolic heart failure ([place]) 04/26/2018   Dysphagia, oropharyngeal 04/26/2018   Chronic constipation 04/26/2018   Adrenal insufficiency (Addison's disease) ([place]) 01/20/2018   Hospital discharge follow-up  01/20/2018   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   CHB (complete heart block) ([place]) 08/17/2017   Decreased GFR 05/18/2017   Hyponatremia 11/01/2016   Drug-induced vitamin B12 deficiency anemia 04/16/2016   Type 2 diabetes mellitus with hyperglycemia, with long-term current use of insulin ([place]) 08/21/2015   Overweight (BMI 25.0-29.9) 10/07/2014   Depression with anxiety 08/08/2014   Encounter for preventive health examination 09/13/2013   Hyperlipidemia associated with type 2 diabetes mellitus ([place]) 07/25/2013   Insomnia 07/25/2013   Hypothyroidism 03/29/2012   Screening for breast cancer 03/29/2012   Screening for colon cancer 03/29/2012   Depression, recurrent ([place]) 08/13/2008   GERD 08/13/2008   Osteopenia 08/13/2008   COLONIC POLYPS, HX OF 08/13/2008    Immunization History  Administered Date(s) Administered   Fluad Quad(high Dose 65+) 06/12/2020   H1N1 08/13/2008   Influenza Split 07/28/2011, 06/28/2012   Influenza, High Dose Seasonal PF 04/14/2016, 07/10/2017, 04/11/2018, 05/07/2019   Influenza,inj,Quad PF,6+ Mos 07/25/2013, 08/21/2015   Influenza-Unspecified 07/22/2015   Moderna Sars-Covid-2 Vaccination 08/15/2019, 09/12/2019, 06/24/2020   PNEUMOCOCCAL CONJUGATE-20 01/13/2021   Pneumococcal Conjugate-13 09/13/2013   Pneumococcal Polysaccharide-23 07/30/2011   Zoster, Live 09/13/2013    Conditions to be addressed/monitored: Atrial Fibrillation, HTN, HLD, and DMII  Care Plan : Medication Management  Updates made by De Hollingshead, RPH-CPP since 02/23/2021 12:00 AM     Problem: Diabetes, Addison's Disease, Atrial Fibrillation, Osteoporosis      Long-Range Goal: Disease Progression Prevention   This Visit's Progress: On track  Recent Progress: On track  Priority: High  Note:   Current Barriers:  Unable to independently monitor therapeutic efficacy Unable to achieve control of diabetes  Complex patient with multiple comorbidities,  including diabetes, Addison's disease, atrial fibrillation, hx pulmonic valve replacement, osteoporosis  Pharmacist Clinical Goal(s):  Over the next 90 days, patient will verbalize ability to afford treatment regimen. Over the next 90 days, patient will achieve adherence to monitoring guidelines and medication adherence to achieve therapeutic efficacy. Over the next 90 days, patient will achieve control of diabetes as evidenced by  improvement in A1c through collaboration with PharmD and provider.   Interventions: 1:1 collaboration with [name], MD regarding development  and update of comprehensive plan of care as evidenced by provider attestation and co-signature Inter-disciplinary care team collaboration (see longitudinal plan of care) Comprehensive medication review performed; medication list updated in electronic medical record  SDOH Over income for patient assistance for [employer] ([place], [place], [place]), [employer] ([place]) or [employer] ([place]). Denies cost concerns with medications at this time  Health Maintenance: COVID vaccinations x 3 Previously discussed Tdap Due for foot exam with next Pcp visit  Diabetes: Uncontrolled but improving; current treatment: Ozempic 1 mg, Tresiba 16 QAM; Jardiance 25 mg daily, Novolog 14 units with breakfast or lunch and 16 units with supper  Hx metformin, but discontinued by endocrinology previously d/t elevated lactic acid and risk of lactic acidosis Current meal patterns: breakfast: bagel w/ cream cheese; eggs and bacon; sometimes toast; coffee w/ milk and artificial sweetener; lunch: banana sandwich w/ 1 piece of bread; sugar free sierra mist or flavored water; supper: salads: cranberries, chicken, tomatoes, lettuce, tomato, cheese, [place], supper: take out - chicken tenders, onion rings - but she will eat just 1; cheese sandwich, tomato soup; dessert: 1-2 times a week they have ice cream Current glucose  readings: using Libre 2 CGM Date of Download: 8/3 - 02/23/21 % Time CGM is active: 77% Average Glucose: 207 mg/dL  Glucose Management Indicator: 8.3% Glucose Variability: 36.1% (goal <36%) Time in Goal:  - Time in range 70-180: 43%  - Time above range: 57%  - Time below range: 0% Observed patterns: [place] within target range, some lows. Elevated post prandials, higher after supper than breakfast.  Extensive dietary intervention. Discussed need to reduce carbohydrate serving sizes, such as only eating 1/2 bagel for breakfast. Discussed ensuring proteins with each meal. Discussed taking Novolog 15-20 minutes prior to meal.  Continue Tresiba 16 units daily, Ozempic 1 mg weekly, Jardiance 25 mg daily. Continue Novolog 14 units with breakfast, but increase to 18 units with lunch or supper Reviewed rule of 15 for treatment of hypoglycemia. Considered increasing Ozempic, but patient already indicates "no appetite" and fear she would continue to choose high carb options with more GI impact of higher dose Ozempic. Consider Mounjaro moving forward when Medicare coverage is available.   Atrial Fibrillation, HTN, hx pulmonic valve replacement Appropriately managed, last clinic BP at goal; current treatment: metoprolol succinate 25 mg daily, furosemide 20 mg daily- advised to reduce to PRN per PCP. Wanted BMP recheck about a month after. Anticoagulant regimen: Eliquis 5 mg BID- reports that she discussed with cardiologist as to whether this could be stopped. They discussed CVA risk. Reiterated this today.  Reviewed to reduce furosemide to PRN use. Scheduled for BMP in 2 weeks. Advised to take if noticing LEE, increased SOB at night/increase in need for pillows (uses 2 at baseline), or increase of 2 lbs in 1 day or 5 lbs in a week as per Dr. Lupita Dawn lab note.  Recommended to continue current regimen at this time.  Hyperlipidemia: Controlled per last lipid panel, though anticipated to improve given recent  dose increase; current treatment: atorvastatin 80 mg daily  Recommended to continue current regimen at this time.  Addison's Disease Appropriately managed; current treatment; hydrocortisone 10 mg QAM, 5 mg QPM; fludrocortisone 0.1 mg daily Previously recommended to continue current regimen at this time with collaboration w/ PCP (patient declined continued endocrinology f/u).   Depression/anxiety/insomnia: Appropriately managed per patient symptom report; current treatment: fluoxetine 10 mg daily, trazodone 150 mg QPM, mirtazapine 7.5 mg daily  Recommended to continue current regimen  at this time.  Continue to monitor for s/sx serotonin syndrome given high serotonergic burden. Monitor weight gain, appetite w/ mirtazapine.   Hypothyroidism: Controlled per last lab result; levothyroxine 100 mcg daily Confirmed appropriate administration. Previously recommended to continue current regimen at this time  Osteoporosis: Untreated; Will discuss treatment, including appropriate calcium + vitamin D and falls risk reduction moving forward.  Last DEXA 2018 w/ osteopenia, FRAX scores did not meet criteria for treatment. Continue to monitor as appropriate.   Supplementation: Patient reports PCP had wanted to start her back on B12 injections. Last B12 WNL. Will communicate w/ PCP about recommendations Vit D level low on last check. Will collaborate w/ PCP for treatment.   Patient Goals/Self-Care Activities Over the next 90 days, patient will:  - take medications as prescribed check blood glucose at least three times daily using CGM, document, and provide at future appointments  Follow Up Plan: Telephone follow up appointment with care management team member scheduled for:~ 6 weeks      Medication Assistance: None required.  Patient affirms current coverage meets needs.  Patient's preferred pharmacy is:  [id number] [place] [address] [address]Phone: [phone]: [phone]  Follow Up:  Patient agrees to Care Plan and Follow-up.  Plan: Telephone follow up appointment with care management team member scheduled for:  ~ 6 weeks  Catie TDarnelle Maffucci PharmD, [place] CJewett CityClinical Pharmacist [facility]cc[facility] [phone]

## 2021-02-23 NOTE — Patient Instructions (Addendum)
Haley Young,   Increase your lunch and supper Novolog to 18 units, continue 14 units of Novolog with breakfast. Continue Tresiba 16 units daily. Continue Ozempic 1 mg weekly. Continue Jardiance 25 mg daily.   Scan when you eat a meal and jot down in the Notes section what you ate. This will help [place] identify patterns to help adjust your regimen.   Only eat 1/2 of a bagel for breakfast, a whole bagel is a lot of carbohydrates.   Reduce furosemide to only use as needed - if you notice swelling in your feet or ankles, that you have a harder time breathing while laying down, or if you notice 2 lbs of weight gain in a day or 5 lbs in a week..   Take care!  Catie Darnelle Maffucci, PharmD [phone]  Visit Information  PATIENT GOALS:  Goals Addressed               This Visit's Progress     Patient Stated     Medication Management (pt-stated)        Patient Goals/Self-Care Activities Over the next 90 days, patient will:  - take medications as prescribed check blood glucose three times daily using CGM, document, and provide at future appointments        The patient verbalized understanding of instructions, educational materials, and care plan provided today and agreed to receive a mailed copy of patient instructions, educational materials, and care plan.   Plan: Telephone follow up appointment with care management team member scheduled for:  ~ 6 weeks  Catie Darnelle Maffucci, PharmD, [place], [place] Clinical Pharmacist [employer] at [employer] [phone]

## 2021-03-09 ENCOUNTER — Other Ambulatory Visit: Payer: Self-pay

## 2021-03-09 ENCOUNTER — Other Ambulatory Visit (INDEPENDENT_AMBULATORY_CARE_PROVIDER_SITE_OTHER): Payer: HMO

## 2021-03-09 ENCOUNTER — Ambulatory Visit: Payer: HMO

## 2021-03-09 ENCOUNTER — Ambulatory Visit: Payer: HMO | Admitting: Pharmacist

## 2021-03-09 DIAGNOSIS — N1831 Chronic kidney disease, stage 3a: Secondary | ICD-10-CM | POA: Diagnosis not present

## 2021-03-09 DIAGNOSIS — E1165 Type 2 diabetes mellitus with hyperglycemia: Secondary | ICD-10-CM

## 2021-03-09 DIAGNOSIS — Z794 Long term (current) use of insulin: Secondary | ICD-10-CM

## 2021-03-09 DIAGNOSIS — F339 Major depressive disorder, recurrent, unspecified: Secondary | ICD-10-CM

## 2021-03-09 DIAGNOSIS — E1169 Type 2 diabetes mellitus with other specified complication: Secondary | ICD-10-CM

## 2021-03-09 LAB — BASIC METABOLIC PANEL
BUN: 19 mg/dL (ref 6–23)
CO2: 25 mEq/L (ref 19–32)
Calcium: 9.1 mg/dL (ref 8.4–10.5)
Chloride: 103 mEq/L (ref 96–112)
Creatinine, Ser: 1.27 mg/dL — ABNORMAL HIGH (ref 0.40–1.20)
GFR: 42.21 mL/min — ABNORMAL LOW (ref >60.00)
Glucose, Bld: 226 mg/dL — ABNORMAL HIGH (ref 70–99)
Potassium: 3.8 mEq/L (ref 3.5–5.1)
Sodium: 139 mEq/L (ref 135–145)

## 2021-03-09 NOTE — Patient Instructions (Signed)
Visit Information  PATIENT GOALS:  Goals Addressed               This Visit's Progress     Patient Stated     Medication Management (pt-stated)        Patient Goals/Self-Care Activities Over the next 90 days, patient will:  - take medications as prescribed check blood glucose three times daily using CGM, document, and provide at future appointments         Patient verbalizes understanding of instructions provided today and agrees to view in [place].   Plan: Telephone follow up appointment with care management team member scheduled for:  ~4 weeks as previously scheduled  Catie Darnelle Maffucci, PharmD, [place], [place] Clinical Pharmacist [employer] at [facility] [phone]

## 2021-03-09 NOTE — Chronic Care Management (AMB) (Signed)
Chronic Care Management Pharmacy Note  03/09/2021 Name:  Haley Young MRN:  [id number] DOB:  1950/12/16  Subjective: Haley Young is an 70 y.o. year old female who is a primary patient of Derrel Nip, Aris Everts, MD.  The CCM team was consulted for assistance with disease management and care coordination needs.    Engaged with patient by telephone for  medication question  in response to provider referral for pharmacy case management and/or care coordination services.   Consent to Services:  The patient was given information about Chronic Care Management services, agreed to services, and gave verbal consent prior to initiation of services.  Please see initial visit note for detailed documentation.   Patient Care Team: [name], MD as PCP - General (Internal Medicine) De Hollingshead, RPH-CPP (Pharmacist)    Objective:  Lab Results  Component Value Date   CREATININE 1.18 01/13/2021   CREATININE 1.16 (H) 06/12/2020   CREATININE 1.17 (H) 03/20/2020    Lab Results  Component Value Date   HGBA1C 9.2 (H) 01/13/2021   Last diabetic Eye exam:  Lab Results  Component Value Date/Time   HMDIABEYEEXA No Retinopathy 12/21/2020 12:00 AM    Last diabetic Foot exam:  Lab Results  Component Value Date/Time   HMDIABFOOTEX Normal 09/13/2013 12:00 AM        Component Value Date/Time   CHOL 129 01/13/2021 1055   TRIG 90.0 01/13/2021 1055   HDL 50.80 01/13/2021 1055   CHOLHDL 3 01/13/2021 1055   VLDL 18.0 01/13/2021 1055   LDLCALC 60 01/13/2021 1055   LDLCALC 112 (H) 06/12/2020 1625   LDLDIRECT 165.0 04/14/2016 1012    Hepatic Function Latest Ref Rng & Units 01/13/2021 06/12/2020 03/20/2020  Total Protein 6.0 - 8.3 g/dL 6.8 7.3 7.3  Albumin 3.5 - 5.2 g/dL 3.9 - 4.0  AST 0 - 37 U/L 38(H) 36(H) 33  ALT 0 - 35 U/L 32 32(H) 25  Alk Phosphatase 39 - 117 U/L 148(H) - 85  Total Bilirubin 0.2 - 1.2 mg/dL 0.5 0.5 1.0  Bilirubin, Direct 0.0 - 0.2 mg/dL - - 0.2    Lab Results   Component Value Date/Time   TSH 4.112 03/20/2020 01:32 PM   TSH 0.76 05/02/2019 11:02 AM   TSH 0.29 (A) 08/30/2018 12:00 AM   TSH 3.54 01/17/2018 11:45 AM   FREET4 1.71 (H) 01/25/2016 11:04 AM   FREET4 1.19 08/21/2015 10:49 AM    CBC Latest Ref Rng & Units 01/13/2021 03/20/2020 08/30/2018  WBC 4.0 - 10.5 K/uL 8.4 13.0(H) 17.0  Hemoglobin 12.0 - 15.0 g/dL 14.6 15.6(H) -  Hematocrit 36.0 - 46.0 % 44.6 48.9(H) -  Platelets 150.0 - 400.0 K/uL 143.0(L) 164 -    Lab Results  Component Value Date/Time   VD25OH 15.16 (L) 01/13/2021 10:55 AM   VD25OH 30.95 04/14/2016 10:12 AM    Clinical ASCVD: Yes  The ASCVD Risk score Mikey Bussing DC Jr., et al., 2013) failed to calculate for the following reasons:   The patient has a prior MI or stroke diagnosis    Social History   Tobacco Use  Smoking Status Never  Smokeless Tobacco Never   BP Readings from Last 3 Encounters:  01/13/21 112/72  06/12/20 102/64  03/20/20 (!) 143/65   Pulse Readings from Last 3 Encounters:  01/13/21 76  06/12/20 87  03/20/20 97   Wt Readings from Last 3 Encounters:  01/13/21 149 lb 3.2 oz (67.7 kg)  07/27/20 142 lb (64.4 kg)  07/15/20 136 lb (61.7 kg)    Assessment: Review of patient past medical history, allergies, medications, health status, including review of consultants reports, laboratory and other test data, was performed as part of comprehensive evaluation and provision of chronic care management services.   SDOH:  (Social Determinants of Health) assessments and interventions performed:  SDOH Interventions    Flowsheet Row Most Recent Value  SDOH Interventions   Financial Strain Interventions Intervention Not Indicated       CCM Care Plan  Allergies  Allergen Reactions   Oxycodone Hcl     REACTION: hallucinations   Albuterol Anxiety    Other reaction(s): Other (See Comments) Tremors    Oxycodone Hcl Other (See Comments)    halucinations    Medications Reviewed Today     Reviewed by  De Hollingshead, RPH-CPP (Pharmacist) on 02/23/21 at 1000  Med List Status: <None>   Medication Order Taking? Sig Documenting Provider Last Dose Status Informant  apixaban (ELIQUIS) 5 MG TABS tablet [id number] Yes Take 1 tablet (5 mg total) by mouth 2 (two) times daily. [name], MD Taking Active   atorvastatin (LIPITOR) 80 MG tablet [id number] Yes TAKE 1 TABLET BY MOUTH EVERY DAY [name], MD Taking Active   Continuous Blood Gluc Receiver (FREESTYLE LIBRE 2 READER) [place] [id number]  Use to check glucose at least TID [name], MD  Active   Continuous Blood Gluc Sensor (FREESTYLE LIBRE 2 SENSOR) [place] [id number]  Use to check glucose at least TID [name], MD  Active   empagliflozin (JARDIANCE) 25 MG TABS tablet [id number] Yes Take 1 tablet (25 mg total) by mouth daily before breakfast. [name], MD Taking Active            Med Note De Hollingshead   Tue Dec 01, 2020  8:36 AM)    Fludrocortisone Acetate (FLORINEF PO) [id number] Yes Take 0.1 mg by mouth daily. [provider] Taking Active   FLUoxetine (PROZAC) 10 MG tablet [id number] Yes TAKE 1 TABLET BY MOUTH EVERY DAY [name], MD Taking Active   furosemide (LASIX) 20 MG tablet [id number] Yes Take 20 mg by mouth as needed. [provider] Taking Active Self  hydrocortisone (CORTEF) 10 MG tablet [id number] Yes Take 5 mg by mouth 2 (two) times daily. 10 mg QAM, 5 PM [provider] Taking Active Self  insulin aspart (NOVOLOG FLEXPEN) 100 UNIT/ML FlexPen [id number] Yes Inject 14 units with breakfast and/or lunch and 18 units with supper [name], MD Taking Active   insulin degludec (TRESIBA FLEXTOUCH) 200 UNIT/ML FlexTouch Pen [id number] Yes Inject 16 Units into the skin daily. [name], MD Taking Active   levothyroxine (SYNTHROID) 100 MCG tablet [id number] Yes Take 100 mcg by mouth daily. [provider] Taking Active   metoprolol succinate (TOPROL-XL) 25 MG 24 hr  tablet [id number] Yes Take 25 mg by mouth daily. [provider] Taking Active   mirtazapine (REMERON) 7.5 MG tablet [id number] Yes TAKE 1 TABLET BY MOUTH EVERYDAY AT BEDTIME [name], MD Taking Active   Probiotic Product (PROBIOTIC ADVANCED PO) [id number] Yes Take 1 capsule by mouth daily. [provider] Taking Active   Semaglutide, 1 MG/DOSE, (OZEMPIC, 1 MG/DOSE,) 4 MG/3ML SOPN [id number] Yes Inject 1 mg into the skin once a week. [name], MD Taking Active   traZODone (DESYREL) 150 MG tablet [id number] Yes TAKE 1 TABLET (150 MG TOTAL) BY MOUTH AT BEDTIME. [name],  MD Taking Active             Patient Active Problem List   Diagnosis Date Noted   CKD (chronic kidney disease) stage 3, GFR 30-59 ml/min (HCC) 01/16/2021   Chronic left SI joint pain 05/01/2019   Transaminitis 05/01/2019   Educated about COVID-19 virus infection 11/15/2018   Anemia 07/06/2018   Chronic atrial fibrillation ([place]) 05/25/2018   Neuropathic pain of hand 05/03/2018   History of cardiac arrest 05/03/2018   Cerebral infarction, watershed distribution, bilateral, remote, resolved 54/56/2563   Chronic systolic heart failure ([place]) 04/26/2018   Dysphagia, oropharyngeal 04/26/2018   Chronic constipation 04/26/2018   Adrenal insufficiency (Addison's disease) ([place]) 01/20/2018   Hospital discharge follow-up 01/20/2018   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   CHB (complete heart block) ([place]) 08/17/2017   Decreased GFR 05/18/2017   Hyponatremia 11/01/2016   Drug-induced vitamin B12 deficiency anemia 04/16/2016   Type 2 diabetes mellitus with hyperglycemia, with long-term current use of insulin ([place]) 08/21/2015   Overweight (BMI 25.0-29.9) 10/07/2014   Depression with anxiety 08/08/2014   Encounter for preventive health examination 09/13/2013   Hyperlipidemia associated with type 2 diabetes mellitus ([place]) 07/25/2013   Insomnia 07/25/2013   Hypothyroidism  03/29/2012   Screening for breast cancer 03/29/2012   Screening for colon cancer 03/29/2012   Depression, recurrent ([place]) 08/13/2008   GERD 08/13/2008   Osteopenia 08/13/2008   COLONIC POLYPS, HX OF 08/13/2008    Immunization History  Administered Date(s) Administered   Fluad Quad(high Dose 65+) 06/12/2020   H1N1 08/13/2008   Influenza Split 07/28/2011, 06/28/2012   Influenza, High Dose Seasonal PF 04/14/2016, 07/10/2017, 04/11/2018, 05/07/2019   Influenza,inj,Quad PF,6+ Mos 07/25/2013, 08/21/2015   Influenza-Unspecified 07/22/2015   Moderna Sars-Covid-2 Vaccination 08/15/2019, 09/12/2019, 06/24/2020   PNEUMOCOCCAL CONJUGATE-20 01/13/2021   Pneumococcal Conjugate-13 09/13/2013   Pneumococcal Polysaccharide-23 07/30/2011   Zoster, Live 09/13/2013    Conditions to be addressed/monitored: Atrial Fibrillation and DMII  Care Plan : Medication Management  Updates made by De Hollingshead, RPH-CPP since 03/09/2021 12:00 AM     Problem: Diabetes, Addison's Disease, Atrial Fibrillation, Osteoporosis      Long-Range Goal: Disease Progression Prevention   This Visit's Progress: On track  Recent Progress: On track  Priority: High  Note:   Current Barriers:  Unable to independently monitor therapeutic efficacy Unable to achieve control of diabetes  Complex patient with multiple comorbidities, including diabetes, Addison's disease, atrial fibrillation, hx pulmonic valve replacement, osteoporosis  Pharmacist Clinical Goal(s):  Over the next 90 days, patient will verbalize ability to afford treatment regimen. Over the next 90 days, patient will achieve adherence to monitoring guidelines and medication adherence to achieve therapeutic efficacy. Over the next 90 days, patient will achieve control of diabetes as evidenced by  improvement in A1c through collaboration with PharmD and provider.   Interventions: 1:1 collaboration with [name], MD regarding development and update  of comprehensive plan of care as evidenced by provider attestation and co-signature Inter-disciplinary care team collaboration (see longitudinal plan of care) Comprehensive medication review performed; medication list updated in electronic medical record  SDOH Over income for patient assistance for [employer] ([place], [place], [place]), [employer] ([place]) or [employer] ([place]). Denies cost concerns with medications at this time  Health Maintenance: COVID vaccinations x 3 Previously discussed Tdap Due for foot exam with next Pcp visit  Health Maintenance Yearly diabetic eye exam: up to date Yearly diabetic foot exam: up to date Yearly influenza  vaccination: due Td/Tdap vaccination: due Pneumonia vaccination: up to date COVID vaccinations: due for 4th booster, consider moving forward.   Shingrix vaccinations: due - will discuss moving forward Colonoscopy: up to date Bone density scan: up to date Mammogram: up to date  Diabetes: Uncontrolled but improving; current treatment: Ozempic 1 mg, Tresiba 16 QAM; Jardiance 25 mg daily, Novolog 14 units with breakfast and 18 units with lunch or supper  Hx metformin, but discontinued by endocrinology previously d/t elevated lactic acid and risk of lactic acidosis Previously recommended to continue current regimen at this time Asks today about Carrington Clamp, as she "heard something about it and Adrenal Disease". Reviewed that due to mineralocoriticoid antagonist impact, Carrington Clamp is contraindicated in patients with adrenal insufficiency. Patient verbalized undersatanding.   Atrial Fibrillation, HTN, hx pulmonic valve replacement Appropriately managed, last clinic BP at goal; current treatment: metoprolol succinate 25 mg daily, furosemide 20 mg daily- advised to reduce to PRN per PCP. Wanted BMP recheck about a month after. Anticoagulant regimen: Eliquis 5 mg BID- reports that she discussed with cardiologist as to whether this  could be stopped. They discussed CVA risk. Reiterated this today.  BMP today to evaluate renal impact of reducing furosemide to QOD dosing.   Hyperlipidemia: Controlled per last lipid panel, though anticipated to improve given recent dose increase; current treatment: atorvastatin 80 mg daily  Previously recommended to continue current regimen at this time.  Addison's Disease Appropriately managed; current treatment; hydrocortisone 10 mg QAM, 5 mg QPM; fludrocortisone 0.1 mg daily Today reports continued fatigue that she "just can't shake". Discussed with PCP, scheduled patient for visit with PCP to discuss fatigue on Friday.   Depression/anxiety/insomnia: Appropriately managed per patient symptom report; current treatment: fluoxetine 10 mg daily, trazodone 150 mg QPM, mirtazapine 7.5 mg daily  Previously recommended to continue current regimen at this time.  Recommend to continue to monitor for s/sx serotonin syndrome given high serotonergic burden. Monitor weight gain, appetite w/ mirtazapine.   Hypothyroidism: Controlled per last lab result; levothyroxine 100 mcg daily Confirmed appropriate administration. Previously recommended to continue current regimen at this time  Osteoporosis: Untreated; Will discuss treatment, including appropriate calcium + vitamin D and falls risk reduction moving forward.  Last DEXA 2018 w/ osteopenia, FRAX scores did not meet criteria for treatment. Continue to monitor as appropriate.   Supplementation: Vitamin B12  Patient Goals/Self-Care Activities Over the next 90 days, patient will:  - take medications as prescribed check blood glucose at least three times daily using CGM, document, and provide at future appointments  Follow Up Plan: Telephone follow up appointment with care management team member scheduled for:~ 4 weeks as previously recommended       Medication Assistance: None required.  Patient affirms current coverage meets  needs.  Patient's preferred pharmacy is:  [id number] [place] [place]Phone: [phone]: [phone]  Follow Up:  Patient agrees to Care Plan and Follow-up.  Plan: Telephone follow up appointment with care management team member scheduled for:  ~4 weeks as previously scheduled  Catie TDarnelle Maffucci PharmD, BPecos CMarydelClinical Pharmacist LOccidental Petrole[facility] BDan[facility]ycl[phone]

## 2021-03-10 ENCOUNTER — Telehealth: Payer: Self-pay | Admitting: Internal Medicine

## 2021-03-10 ENCOUNTER — Other Ambulatory Visit: Payer: Self-pay | Admitting: Internal Medicine

## 2021-03-10 DIAGNOSIS — E1165 Type 2 diabetes mellitus with hyperglycemia: Secondary | ICD-10-CM

## 2021-03-10 MED ORDER — TRESIBA FLEXTOUCH 200 UNIT/ML ~~LOC~~ SOPN: 16.0000 [IU] | PEN_INJECTOR | Freq: Every day | SUBCUTANEOUS | 2 refills | Status: DC

## 2021-03-10 NOTE — Telephone Encounter (Signed)
Patient is calling in to request a refill on her insulin degludec (TRESIBA FLEXTOUCH) 200 UNIT/ML FlexTouch Pen.[facility] on [address]

## 2021-03-12 ENCOUNTER — Telehealth: Payer: Self-pay | Admitting: Internal Medicine

## 2021-03-12 ENCOUNTER — Other Ambulatory Visit: Payer: Self-pay

## 2021-03-12 ENCOUNTER — Ambulatory Visit (INDEPENDENT_AMBULATORY_CARE_PROVIDER_SITE_OTHER): Payer: HMO | Admitting: Internal Medicine

## 2021-03-12 ENCOUNTER — Telehealth: Payer: Self-pay

## 2021-03-12 ENCOUNTER — Ambulatory Visit (INDEPENDENT_AMBULATORY_CARE_PROVIDER_SITE_OTHER): Payer: HMO

## 2021-03-12 ENCOUNTER — Encounter: Payer: Self-pay | Admitting: Internal Medicine

## 2021-03-12 VITALS — BP 114/76 | HR 72 | Ht 60.98 in | Wt 156.2 lb

## 2021-03-12 DIAGNOSIS — M25551 Pain in right hip: Secondary | ICD-10-CM | POA: Diagnosis not present

## 2021-03-12 DIAGNOSIS — E538 Deficiency of other specified B group vitamins: Secondary | ICD-10-CM

## 2021-03-12 DIAGNOSIS — I272 Pulmonary hypertension, unspecified: Secondary | ICD-10-CM | POA: Diagnosis not present

## 2021-03-12 DIAGNOSIS — E1165 Type 2 diabetes mellitus with hyperglycemia: Secondary | ICD-10-CM

## 2021-03-12 DIAGNOSIS — E559 Vitamin D deficiency, unspecified: Secondary | ICD-10-CM

## 2021-03-12 DIAGNOSIS — I5022 Chronic systolic (congestive) heart failure: Secondary | ICD-10-CM

## 2021-03-12 DIAGNOSIS — N1831 Chronic kidney disease, stage 3a: Secondary | ICD-10-CM | POA: Diagnosis not present

## 2021-03-12 DIAGNOSIS — Z794 Long term (current) use of insulin: Secondary | ICD-10-CM | POA: Diagnosis not present

## 2021-03-12 DIAGNOSIS — R5383 Other fatigue: Secondary | ICD-10-CM | POA: Diagnosis not present

## 2021-03-12 DIAGNOSIS — M1611 Unilateral primary osteoarthritis, right hip: Secondary | ICD-10-CM | POA: Diagnosis not present

## 2021-03-12 DIAGNOSIS — R0600 Dyspnea, unspecified: Secondary | ICD-10-CM

## 2021-03-12 DIAGNOSIS — G4710 Hypersomnia, unspecified: Secondary | ICD-10-CM | POA: Diagnosis not present

## 2021-03-12 DIAGNOSIS — R0609 Other forms of dyspnea: Secondary | ICD-10-CM

## 2021-03-12 DIAGNOSIS — E1169 Type 2 diabetes mellitus with other specified complication: Secondary | ICD-10-CM

## 2021-03-12 LAB — BRAIN NATRIURETIC PEPTIDE: Pro B Natriuretic peptide (BNP): 204 pg/mL — ABNORMAL HIGH (ref 0.0–100.0)

## 2021-03-12 LAB — TSH: TSH: 2.9 u[IU]/mL (ref 0.35–5.50)

## 2021-03-12 MED ORDER — SEMAGLUTIDE (2 MG/DOSE) 8 MG/3ML ~~LOC~~ SOPN: 2.0000 mg | PEN_INJECTOR | SUBCUTANEOUS | 2 refills | Status: DC

## 2021-03-12 MED ORDER — TETANUS-DIPHTH-ACELL PERTUSSIS 5-2.5-18.5 LF-MCG/0.5 IM SUSY: 0.5000 mL | PREFILLED_SYRINGE | Freq: Once | INTRAMUSCULAR | 0 refills | Status: AC

## 2021-03-12 MED ORDER — TRESIBA FLEXTOUCH 200 UNIT/ML ~~LOC~~ SOPN: 10.0000 [IU] | PEN_INJECTOR | Freq: Every day | SUBCUTANEOUS | 2 refills | Status: DC

## 2021-03-12 MED ORDER — NOVOLOG FLEXPEN 100 UNIT/ML ~~LOC~~ SOPN: PEN_INJECTOR | SUBCUTANEOUS | 3 refills | Status: DC

## 2021-03-12 NOTE — Progress Notes (Signed)
Subjective:  Patient ID: Haley Young, female    DOB: 12-06-1950  Age: 70 y.o. MRN: [id number]  CC: The primary encounter diagnosis was Dyspnea on exertion. Diagnoses of Fatigue, unspecified type, Right hip pain, Hypersomnia, Chronic systolic heart failure ([place]), Pulmonary hypertension ([place]), Type 2 diabetes mellitus with hyperglycemia, with long-term current use of insulin ([place]), and Stage 3a chronic kidney disease ([place]) were also pertinent to this visit.  HPI BRYLEA MOSCOSO presents for  Chief Complaint  Patient presents with   Follow-up    Pt states she has no energy and is having shortness of breath walking short distances. Pt states she is also having some rt hip pain   This visit occurred during the SARS-CoV-2 public health emergency.  Safety protocols were in place, including screening questions prior to the visit, additional usage of staff PPE, and extensive cleaning of exam room while observing appropriate contact time as indicated for disinfecting solutions.   Cc:  chronic fatigue ("zero energy" for the last two years ') sleepy all the itme .  Never tested for sleep apnea,  but per husbnad she does not snore.  She sleeps 12 hours daily.  Has been using lasix prn but not weighing.  Last dose was 4 days ago. When hands became tight.    Has chronic orthopnea ,  2 pillows  no change  ,  exertional fatigue and shortness of breath;   Has recurrent hypoglycemic events at 3 am. Despite having a cbg of 250 at bedtime ( 9 pm) takes last novolog dose at 5 pm  Dose of insulin novolog 14 units breakfast and lunch,  16 units at dinner.   Tresiba 18 units daily at night.    Also having highs to   Using CBG .  Alarms set for 70  and no upper alarm set  Discussed adding alram for 240 at night   Has eaten today : coffee and bagel    alarm going off for 242   right hip pain, anterior  for the past month.  Hurts to sit down and get up.  Improves with activity but does not resolve.    Has pulmonic  stenosis  History of grand mal seizures during  younger adult life,  attributed to the stress of a new baby.  Was treated with phenobarbital.  Has woken up several times with head shaking    Has not seen cardiology since June.  Ehco planed for feb   Uncontrolled diabetes: a1c improved from 13 to 9 in July   Lab Results  Component Value Date   TSH 4.112 03/20/2020     Outpatient Medications Prior to Visit  Medication Sig Dispense Refill   apixaban (ELIQUIS) 5 MG TABS tablet Take 1 tablet (5 mg total) by mouth 2 (two) times daily. 60 tablet 11   atorvastatin (LIPITOR) 80 MG tablet TAKE 1 TABLET BY MOUTH EVERY DAY 90 tablet 1   Continuous Blood Gluc Receiver (FREESTYLE LIBRE 2 READER) DEVI Use to check glucose at least TID 1 each 1   Continuous Blood Gluc Sensor (FREESTYLE LIBRE 2 SENSOR) MISC Use to check glucose at least TID 2 each 11   empagliflozin (JARDIANCE) 25 MG TABS tablet Take 1 tablet (25 mg total) by mouth daily before breakfast. 90 tablet 3   ergocalciferol (VITAMIN D2) 1.25 MG (50000 UT) capsule Take 1 capsule (50,000 Units total) by mouth once a week. 4 capsule 5   Fludrocortisone Acetate (FLORINEF PO) Take 0.1 mg  by mouth daily.     FLUoxetine (PROZAC) 10 MG tablet TAKE 1 TABLET BY MOUTH EVERY DAY 90 tablet 1   furosemide (LASIX) 20 MG tablet Take 20 mg by mouth as needed.     hydrocortisone (CORTEF) 10 MG tablet Take 5 mg by mouth 2 (two) times daily. 10 mg QAM, 5 PM     insulin degludec (TRESIBA FLEXTOUCH) 200 UNIT/ML FlexTouch Pen Inject 16 Units into the skin daily. 9 mL 2   levothyroxine (SYNTHROID) 100 MCG tablet Take 100 mcg by mouth daily.     metoprolol succinate (TOPROL-XL) 25 MG 24 hr tablet Take 25 mg by mouth daily.     mirtazapine (REMERON) 7.5 MG tablet TAKE 1 TABLET BY MOUTH EVERYDAY AT BEDTIME 90 tablet 1   Probiotic Product (PROBIOTIC ADVANCED PO) Take 1 capsule by mouth daily.     traZODone (DESYREL) 150 MG tablet TAKE 1 TABLET BY MOUTH AT BEDTIME. 90  tablet 1   insulin aspart (NOVOLOG FLEXPEN) 100 UNIT/ML FlexPen Inject 14 units with breakfast and 18 units with lunch and supper 30 mL 3   Semaglutide, 1 MG/DOSE, (OZEMPIC, 1 MG/DOSE,) 4 MG/3ML SOPN Inject 1 mg into the skin once a week. 9 mL 1   TRESIBA FLEXTOUCH 200 UNIT/ML FlexTouch Pen INJECT 20 UNITS DAILY. TITRATE AS INSTRUCTED. MAX DAILY DOSE: 40 UNITS 15 mL 2   No facility-administered medications prior to visit.    Review of Systems;  Patient denies headache, fevers, malaise, unintentional weight loss, skin rash, eye pain, sinus congestion and sinus pain, sore throat, dysphagia,  hemoptysis , cough, dyspnea, wheezing, chest pain, palpitations, orthopnea, edema, abdominal pain, nausea, melena, diarrhea, constipation, flank pain, dysuria, hematuria, urinary  Frequency, nocturia, numbness, tingling, seizures,  Focal weakness, Loss of consciousness,  Tremor, insomnia, depression, anxiety, and suicidal ideation.      Objective:  BP 114/76   Pulse 72   Ht 5' 0.98" (1.549 m)   Wt 156 lb 3.2 oz (70.9 kg)   SpO2 96%   BMI 29.53 kg/m   BP Readings from Last 3 Encounters:  03/12/21 114/76  01/13/21 112/72  06/12/20 102/64    Wt Readings from Last 3 Encounters:  03/12/21 156 lb 3.2 oz (70.9 kg)  01/13/21 149 lb 3.2 oz (67.7 kg)  07/27/20 142 lb (64.4 kg)    General appearance: alert, cooperative and appears stated age Ears: normal TM's and external ear canals both ears Throat: lips, mucosa, and tongue normal; teeth and gums normal Neck: no adenopathy, no carotid bruit, supple, symmetrical, trachea midline and thyroid not enlarged, symmetric, no tenderness/mass/nodules Back: symmetric, no curvature. ROM normal. No CVA tenderness. Lungs: clear to auscultation bilaterally Heart: regular rate and rhythm, S1, S2 normal, no murmur, click, rub or gallop Abdomen: soft, non-tender; bowel sounds normal; no masses,  no organomegaly Pulses: 2+ and symmetric Skin: Skin color, texture,  turgor normal. No rashes or lesions Lymph nodes: Cervical, supraclavicular, and axillary nodes normal. MSK:  full ROM right hip , pain with internal rotation    Lab Results  Component Value Date   HGBA1C 9.2 (H) 01/13/2021   HGBA1C 13.2 (H) 06/12/2020   HGBA1C 7.8 (H) 05/02/2019    Lab Results  Component Value Date   CREATININE 1.27 (H) 03/09/2021   CREATININE 1.18 01/13/2021   CREATININE 1.16 (H) 06/12/2020    Lab Results  Component Value Date   WBC 8.4 01/13/2021   HGB 14.6 01/13/2021   HCT 44.6 01/13/2021   PLT 143.0 (L)  01/13/2021   GLUCOSE 226 (H) 03/09/2021   CHOL 129 01/13/2021   TRIG 90.0 01/13/2021   HDL 50.80 01/13/2021   LDLDIRECT 165.0 04/14/2016   LDLCALC 60 01/13/2021   ALT 32 01/13/2021   AST 38 (H) 01/13/2021   NA 139 03/09/2021   K 3.8 03/09/2021   CL 103 03/09/2021   CREATININE 1.27 (H) 03/09/2021   BUN 19 03/09/2021   CO2 25 03/09/2021   TSH 4.112 03/20/2020   INR 1.5 (H) 03/20/2020   HGBA1C 9.2 (H) 01/13/2021   MICROALBUR 0.5 06/12/2020    CT Head Wo Contrast  Result Date: 03/20/2020 CLINICAL DATA:  Hemorrhage suspected.  Headache with vomiting. EXAM: CT HEAD WITHOUT CONTRAST TECHNIQUE: Contiguous axial images were obtained from the base of the skull through the vertex without intravenous contrast. COMPARISON:  CT head 08/14/2010 FINDINGS: Brain: No evidence of acute large vascular territory infarct. Similar encephalomalacia involving the high left parietal lobe. Small remote lacunar infarct in left cerebellum. No acute hemorrhage. No hydrocephalus Vascular: Calcific atherosclerosis. No hyperdense vessel identified. Skull: Normal. Negative for fracture or focal lesion. Sinuses/Orbits: No acute finding. Other: No mastoid effusion. IMPRESSION: 1. No acute intracranial abnormality. 2. Similar left parietal encephalomalacia and remote left cerebellar lacunar infarct. Electronically Signed   By: Margaretha Sheffield MD   On: 03/20/2020 14:36   DG Chest  Port 1 View  Result Date: 03/20/2020 CLINICAL DATA:  70 year old female with possible sepsis. Hypotension. History of pulmonic valve replacement. EXAM: PORTABLE CHEST 1 VIEW COMPARISON:  Chest radiographs 08/17/2017 and earlier. FINDINGS: Portable AP view at 1350 hours. Stable left chest 3 lead pacemaker. Revision of cardiac valve replacement since 2019. Stable cardiac size and mediastinal contours. Visualized tracheal air column is within normal limits. No pneumothorax, pulmonary edema, pleural effusion or acute pulmonary opacity. No acute osseous abnormality identified. Negative visible bowel gas pattern. IMPRESSION: No acute cardiopulmonary abnormality. Electronically Signed   By: Genevie Ann M.D.   On: 03/20/2020 14:11    Assessment & Plan:   Problem List Items Addressed This Visit       Unprioritized   Fatigue    With exertional dyspnea.  Likely multifactorial including chronic health issues (Adrenal insufficiency, pulmonary hypertension, hypothyroidism, CAD and uncontrolled DM).  Checking thyroid function, and overnight sleep study ordered to rule out nocturnal hypoxemia.  Advised to follow up with [facility]ologist at [facility] ASAP to have ECHO repeated.      Relevant Orders   TSH   Ambulatory referral to Sleep Studies   Type 2 diabetes mellitus with hyperglycemia, with long-term current use of insulin (HCC)    CBG data downloaded and reviewed.  She has only had two hypoglycemic episodes in the last month (much less than she reported).  All post prandials have been > 180 on a regimen of 14,  14 and 16 units of mealtime novolg and 18 units Tresiba and 1 mg ozempic.  Will increase mealtime insulin to 16, 16 and 20 units,  Decrease Tresiba to 12 units , and increase Ozempic to 2 mg  Daily.  Follow up with Catie Alvia Grove D in 3 weeks        Relevant Medications   insulin aspart (NOVOLOG FLEXPEN) 100 UNIT/ML FlexPen   insulin degludec (TRESIBA FLEXTOUCH) 200 UNIT/ML FlexTouch Pen   Semaglutide, 2  MG/DOSE, 8 MG/3ML SOPN   Chronic systolic heart failure (HCC)   Relevant Orders   Ambulatory referral to Sleep Studies   CKD (chronic kidney disease) stage 3, GFR  30-59 ml/min [facility])    Nephrology referral recommended  GFR has been < 50 ml/min since Oct 2020 .she is not taking NSAIDS  But is taking a daily diuretic due to diagnosis of CHF with systolic dysfunction  Referral to nephrology advised  Lab Results  Component Value Date   CREATININE 1.27 (H) 03/09/2021   Lab Results  Component Value Date   MICROALBUR 0.5 06/12/2020   MICROALBUR 3.0 (H) 05/02/2019           Right hip pain    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  Plain films ordred; patient requesting a lift chair .  DME printed and signed      Relevant Orders   DG Hip Unilat W OR W/O Pelvis 2-3 Views Right   For home use only DME Other see comment   Other Visit Diagnoses     Dyspnea on exertion    -  Primary   Relevant Orders   B Nat Peptide   Hypersomnia       Relevant Orders   Ambulatory referral to Sleep Studies   Pulmonary hypertension ([place])       Relevant Orders   Ambulatory referral to Sleep Studies       I have discontinued Kirkland Hun. Handler's Ozempic (1 MG/DOSE). I have also changed her NovoLOG FlexPen and [employer]. Additionally, I am having her start on Semaglutide (2 MG/DOSE) and Tdap. Lastly, I am having her maintain her hydrocortisone, furosemide, apixaban, Fludrocortisone Acetate (FLORINEF PO), metoprolol succinate, levothyroxine, Probiotic Product (PROBIOTIC ADVANCED PO), FreeStyle Libre 2 Sensor, FreeStyle Libre 2 Reader, empagliflozin, mirtazapine, FLUoxetine, ergocalciferol, atorvastatin, traZODone, and [employer].  Meds ordered this encounter  Medications   insulin aspart (NOVOLOG FLEXPEN) 100 UNIT/ML FlexPen    Sig: Inject 16 units before  breakfast and lunchtime meals, and  20 units before supper    Dispense:  30 mL    Refill:  3   insulin degludec (TRESIBA  FLEXTOUCH) 200 UNIT/ML FlexTouch Pen    Sig: Inject 10 Units into the skin daily.    Dispense:  15 mL    Refill:  2   Semaglutide, 2 MG/DOSE, 8 MG/3ML SOPN    Sig: Inject 2 mg as directed once a week.    Dispense:  3 mL    Refill:  2   Tdap (BOOSTRIX) 5-2.5-18.5 LF-MCG/0.5 injection    Sig: Inject 0.5 mLs into the muscle once for 1 dose.    Dispense:  0.5 mL    Refill:  0    Medications Discontinued During This Encounter  Medication Reason   Semaglutide, 1 MG/DOSE, (OZEMPIC, 1 MG/DOSE,) 4 MG/3ML SOPN    insulin aspart (NOVOLOG FLEXPEN) 100 UNIT/ML FlexPen    TRESIBA FLEXTOUCH 200 UNIT/ML FlexTouch Pen     Follow-up: No follow-ups on file.   [name], MD

## 2021-03-12 NOTE — Telephone Encounter (Signed)
Labs ordered.

## 2021-03-12 NOTE — Assessment & Plan Note (Addendum)
CBG data downloaded and reviewed.  She has only had two hypoglycemic episodes in the last month (much less than she reported).  All post prandials have been > 180 on a regimen of 14,  14 and 16 units of mealtime novolg and 18 units Tresiba and 1 mg ozempic.  Will increase mealtime insulin to 16, 16 and 20 units,  Decrease Tresiba to 12 units , and increase Ozempic to 2 mg  Daily.  Follow up with Catie Alvia Grove D in 3 weeks

## 2021-03-12 NOTE — Telephone Encounter (Signed)
Patient would like to have labs done a few days before her appointment on 10/24 with Dr.Tullo.Please add lab orders for the patient.

## 2021-03-12 NOTE — Assessment & Plan Note (Signed)
With exertional dyspnea.  Likely multifactorial including chronic health issues (Adrenal insufficiency, pulmonary hypertension, hypothyroidism, CAD and uncontrolled DM).  Checking thyroid function, and overnight sleep study ordered to rule out nocturnal hypoxemia.  Advised to follow up with cardiologist at [facility] ASAP to have ECHO repeated.

## 2021-03-12 NOTE — Assessment & Plan Note (Signed)
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  Plain films ordred; patient requesting a lift chair .  DME printed and signed

## 2021-03-12 NOTE — Telephone Encounter (Signed)
Also sent via mychart  but not sure she reads it   Your blood sugar from your CBG  has been a downloaded and reviewed.With Catie.  You have only  had two hypoglycemic episodes in the last month (much less than  you  reported).  All post prandials have been > 180 on a regimen of 14,  14 and 16 units of mealtime novolog and 18 units Tresiba and 1 mg ozempic.   I advise you to make the following changes:  1) increase mealtime  (Novolog)  insulin to 16, 16 and 20 units 2) Decrease Tresiba to 12 units  3) increase Ozempic to 2 mg  Daily.   4) Follow up with Catie Alvia Grove D in 3 weeks    All medications have been updated and sent to pharmacy.  Call office if the ozempic is on back order per pharmacy  Regards,   Deborra Medina, MD

## 2021-03-12 NOTE — Assessment & Plan Note (Signed)
Nephrology referral recommended  GFR has been < 50 ml/min since Oct 2020 .she is not taking NSAIDS  But is taking a daily diuretic due to diagnosis of CHF with systolic dysfunction  Referral to nephrology advised  Lab Results  Component Value Date   CREATININE 1.27 (H) 03/09/2021   Lab Results  Component Value Date   MICROALBUR 0.5 06/12/2020   MICROALBUR 3.0 (H) 05/02/2019

## 2021-03-12 NOTE — Patient Instructions (Addendum)
Your fatigue may be from low blood pressure,  low oxygen,  low ejection fracture,  or deconditioning  Check readings at home and notify me if they are less than 110/70   I am ordering  a  sleep study  Please notify dr ward and ask to have the ECHO done sooner rather than Feb 2023   I want you to check your blood sugar 2 hours after you evening meal AND at bedtime   A1c is due again in mid October (please schedule a lab appt and a follow up visit)

## 2021-03-12 NOTE — Telephone Encounter (Signed)
Orders placed for CBC,A1C,CMP,Lipid panel,Vitamin D and Vitamin B12 for future labs

## 2021-03-12 NOTE — Addendum Note (Signed)
Addended by: [name] on: 03/12/2021 01:36 PM   Modules accepted: Orders

## 2021-03-16 ENCOUNTER — Telehealth: Payer: Self-pay | Admitting: Internal Medicine

## 2021-03-16 NOTE — Telephone Encounter (Signed)
Patient would like a call back from Catie about her Ozempic.

## 2021-03-16 NOTE — Telephone Encounter (Signed)
Patient has read mychart.

## 2021-03-16 NOTE — Telephone Encounter (Signed)
Called patient back. LVM for her to return my call at her convenience.

## 2021-03-17 ENCOUNTER — Ambulatory Visit: Payer: HMO | Admitting: Pharmacist

## 2021-03-17 DIAGNOSIS — E1169 Type 2 diabetes mellitus with other specified complication: Secondary | ICD-10-CM

## 2021-03-17 DIAGNOSIS — Z794 Long term (current) use of insulin: Secondary | ICD-10-CM

## 2021-03-17 DIAGNOSIS — N1831 Chronic kidney disease, stage 3a: Secondary | ICD-10-CM

## 2021-03-17 DIAGNOSIS — E1165 Type 2 diabetes mellitus with hyperglycemia: Secondary | ICD-10-CM

## 2021-03-17 NOTE — Chronic Care Management (AMB) (Signed)
Chronic Care Management Pharmacy Note  03/17/2021 Name:  NIKOLETTE REINDL MRN:  [id number] DOB:  1950-08-08  Subjective: BIDDIE SEBEK is an 70 y.o. year old female who is a primary patient of Derrel Nip, Aris Everts, MD.  The CCM team was consulted for assistance with disease management and care coordination needs.    Engaged with patient by telephone for medication management question in response to provider referral for pharmacy case management and/or care coordination services.   Consent to Services:  The patient was given information about Chronic Care Management services, agreed to services, and gave verbal consent prior to initiation of services.  Please see initial visit note for detailed documentation.   Patient Care Team: [name], MD as PCP - General (Internal Medicine) De Hollingshead, RPH-CPP (Pharmacist)   Objective:  Lab Results  Component Value Date   CREATININE 1.27 (H) 03/09/2021   CREATININE 1.18 01/13/2021   CREATININE 1.16 (H) 06/12/2020    Lab Results  Component Value Date   HGBA1C 9.2 (H) 01/13/2021   Last diabetic Eye exam:  Lab Results  Component Value Date/Time   HMDIABEYEEXA No Retinopathy 12/21/2020 12:00 AM    Last diabetic Foot exam:  Lab Results  Component Value Date/Time   HMDIABFOOTEX Normal 09/13/2013 12:00 AM        Component Value Date/Time   CHOL 129 01/13/2021 1055   TRIG 90.0 01/13/2021 1055   HDL 50.80 01/13/2021 1055   CHOLHDL 3 01/13/2021 1055   VLDL 18.0 01/13/2021 1055   LDLCALC 60 01/13/2021 1055   LDLCALC 112 (H) 06/12/2020 1625   LDLDIRECT 165.0 04/14/2016 1012    Hepatic Function Latest Ref Rng & Units 01/13/2021 06/12/2020 03/20/2020  Total Protein 6.0 - 8.3 g/dL 6.8 7.3 7.3  Albumin 3.5 - 5.2 g/dL 3.9 - 4.0  AST 0 - 37 U/L 38(H) 36(H) 33  ALT 0 - 35 U/L 32 32(H) 25  Alk Phosphatase 39 - 117 U/L 148(H) - 85  Total Bilirubin 0.2 - 1.2 mg/dL 0.5 0.5 1.0  Bilirubin, Direct 0.0 - 0.2 mg/dL - - 0.2    Lab Results   Component Value Date/Time   TSH 2.90 03/12/2021 10:35 AM   TSH 4.112 03/20/2020 01:32 PM   TSH 0.76 05/02/2019 11:02 AM   FREET4 1.71 (H) 01/25/2016 11:04 AM   FREET4 1.19 08/21/2015 10:49 AM    CBC Latest Ref Rng & Units 01/13/2021 03/20/2020 08/30/2018  WBC 4.0 - 10.5 K/uL 8.4 13.0(H) 17.0  Hemoglobin 12.0 - 15.0 g/dL 14.6 15.6(H) -  Hematocrit 36.0 - 46.0 % 44.6 48.9(H) -  Platelets 150.0 - 400.0 K/uL 143.0(L) 164 -    Lab Results  Component Value Date/Time   VD25OH 15.16 (L) 01/13/2021 10:55 AM   VD25OH 30.95 04/14/2016 10:12 AM    Clinical ASCVD: Yes  The ASCVD Risk score Mikey Bussing DC Jr., et al., 2013) failed to calculate for the following reasons:   The patient has a prior MI or stroke diagnosis     Social History   Tobacco Use  Smoking Status Never  Smokeless Tobacco Never   BP Readings from Last 3 Encounters:  03/12/21 114/76  01/13/21 112/72  06/12/20 102/64   Pulse Readings from Last 3 Encounters:  03/12/21 72  01/13/21 76  06/12/20 87   Wt Readings from Last 3 Encounters:  03/12/21 156 lb 3.2 oz (70.9 kg)  01/13/21 149 lb 3.2 oz (67.7 kg)  07/27/20 142 lb (64.4 kg)  Assessment: Review of patient past medical history, allergies, medications, health status, including review of consultants reports, laboratory and other test data, was performed as part of comprehensive evaluation and provision of chronic care management services.   SDOH:  (Social Determinants of Health) assessments and interventions performed:  SDOH Interventions    Flowsheet Row Most Recent Value  SDOH Interventions   Financial Strain Interventions Intervention Not Indicated       CCM Care Plan  Allergies  Allergen Reactions   Oxycodone Hcl     REACTION: hallucinations   Albuterol Anxiety    Other reaction(s): Other (See Comments) Tremors    Oxycodone Hcl Other (See Comments)    halucinations    Medications Reviewed Today     Reviewed by De Hollingshead, RPH-CPP  (Pharmacist) on 03/17/21 at 1333  Med List Status: <None>   Medication Order Taking? Sig Documenting Provider Last Dose Status Informant  apixaban (ELIQUIS) 5 MG TABS tablet [id number]  Take 1 tablet (5 mg total) by mouth 2 (two) times daily. [name], MD  Active   atorvastatin (LIPITOR) 80 MG tablet [id number]  TAKE 1 TABLET BY MOUTH EVERY DAY [name], MD  Active   Continuous Blood Gluc Receiver (FREESTYLE LIBRE 2 READER) [place] [id number]  Use to check glucose at least TID [name], MD  Active   Continuous Blood Gluc Sensor (FREESTYLE LIBRE 2 SENSOR) [place] [id number]  Use to check glucose at least TID [name], MD  Active   empagliflozin (JARDIANCE) 25 MG TABS tablet [id number]  Take 1 tablet (25 mg total) by mouth daily before breakfast. [name], MD  Active            Med Note Darnelle Maffucci, Arville Lime   Tue Dec 01, 2020  8:36 AM)    ergocalciferol (VITAMIN D2) 1.25 MG (50000 UT) capsule [id number]  Take 1 capsule (50,000 Units total) by mouth once a week. [name], MD  Active   Fludrocortisone Acetate (FLORINEF PO) [id number]  Take 0.1 mg by mouth daily. [provider]  Active   FLUoxetine (PROZAC) 10 MG tablet [id number]  TAKE 1 TABLET BY MOUTH EVERY DAY [name], MD  Active   furosemide (LASIX) 20 MG tablet [id number]  Take 20 mg by mouth as needed. [provider]  Active Self  hydrocortisone (CORTEF) 10 MG tablet [id number]  Take 5 mg by mouth 2 (two) times daily. 10 mg QAM, 5 PM [provider]  Active Self  insulin aspart (NOVOLOG FLEXPEN) 100 UNIT/ML FlexPen [id number] Yes Inject 16 units before  breakfast and lunchtime meals, and  20 units before supper [name], MD Taking Active   insulin degludec (TRESIBA FLEXTOUCH) 200 UNIT/ML FlexTouch Pen [id number]  Inject 10 Units into the skin daily. [name], MD  Active   levothyroxine (SYNTHROID) 100 MCG tablet [id number]  Take 100 mcg by mouth daily. [provider]  Active   metoprolol succinate (TOPROL-XL) 25 MG 24 hr tablet [id number]  Take 25 mg by mouth daily. [provider]  Active   mirtazapine (REMERON) 7.5 MG tablet [id number]  TAKE 1 TABLET BY MOUTH EVERYDAY AT BEDTIME [name], MD  Active   Probiotic Product (PROBIOTIC ADVANCED PO) [id number]  Take 1 capsule by mouth daily. [provider]  Active   Semaglutide, 2 MG/DOSE, 8 MG/3ML SOPN [id number] Yes Inject 2 mg as directed once a week. [name], MD Taking Active  traZODone (DESYREL) 150 MG tablet [id number]  TAKE 1 TABLET BY MOUTH AT BEDTIME. [name], MD  Active             Patient Active Problem List   Diagnosis Date Noted   Right hip pain 03/12/2021   CKD (chronic kidney disease) stage 3, GFR 30-59 ml/min (HCC) 01/16/2021   Chronic left SI joint pain 05/01/2019   Transaminitis 05/01/2019   Educated about COVID-19 virus infection 11/15/2018   Anemia 07/06/2018   Chronic atrial fibrillation ([name]) 05/25/2018   Neuropathic pain of hand 05/03/2018   History of cardiac arrest 05/03/2018   Cerebral infarction, watershed distribution, bilateral, remote, resolved 81/07/7508   Chronic systolic heart failure ([place]) 04/26/2018   Dysphagia, oropharyngeal 04/26/2018   Chronic constipation 04/26/2018   Adrenal insufficiency (Addison's disease) ([place]) 01/20/2018   Hospital discharge follow-up 01/20/2018   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   CHB (complete heart block) ([place]) 08/17/2017   Decreased GFR 05/18/2017   Hyponatremia 11/01/2016   Drug-induced vitamin B12 deficiency anemia 04/16/2016   Type 2 diabetes mellitus with hyperglycemia, with long-term current use of insulin ([place]) 08/21/2015   Fatigue 03/27/2015   Overweight (BMI 25.0-29.9) 10/07/2014   Depression with anxiety 08/08/2014   Encounter for preventive health examination 09/13/2013   Hyperlipidemia associated with type 2 diabetes mellitus ([place]) 07/25/2013   Insomnia  07/25/2013   Hypothyroidism 03/29/2012   Screening for breast cancer 03/29/2012   Screening for colon cancer 03/29/2012   Depression, recurrent ([place]) 08/13/2008   GERD 08/13/2008   Osteopenia 08/13/2008   COLONIC POLYPS, HX OF 08/13/2008    Immunization History  Administered Date(s) Administered   Fluad Quad(high Dose 65+) 06/12/2020   H1N1 08/13/2008   Influenza Split 07/28/2011, 06/28/2012   Influenza, High Dose Seasonal PF 04/14/2016, 07/10/2017, 04/11/2018, 05/07/2019   Influenza,inj,Quad PF,6+ Mos 07/25/2013, 08/21/2015   Influenza-Unspecified 07/22/2015   Moderna Sars-Covid-2 Vaccination 08/15/2019, 09/12/2019, 06/24/2020   PNEUMOCOCCAL CONJUGATE-20 01/13/2021   Pneumococcal Conjugate-13 09/13/2013   Pneumococcal Polysaccharide-23 07/30/2011   Zoster, Live 09/13/2013    Conditions to be addressed/monitored: HTN, HLD, and DMII  Care Plan : Medication Management  Updates made by De Hollingshead, RPH-CPP since 03/17/2021 12:00 AM     Problem: Diabetes, Addison's Disease, Atrial Fibrillation, Osteoporosis      Long-Range Goal: Disease Progression Prevention   This Visit's Progress: On track  Recent Progress: On track  Priority: High  Note:   Current Barriers:  Unable to independently monitor therapeutic efficacy Unable to achieve control of diabetes  Complex patient with multiple comorbidities, including diabetes, Addison's disease, atrial fibrillation, hx pulmonic valve replacement, osteoporosis  Pharmacist Clinical Goal(s):  Over the next 90 days, patient will verbalize ability to afford treatment regimen. Over the next 90 days, patient will achieve adherence to monitoring guidelines and medication adherence to achieve therapeutic efficacy. Over the next 90 days, patient will achieve control of diabetes as evidenced by  improvement in A1c through collaboration with PharmD and provider.   Interventions: 1:1 collaboration with [name], MD regarding  development and update of comprehensive plan of care as evidenced by provider attestation and co-signature Inter-disciplinary care team collaboration (see longitudinal plan of care) Comprehensive medication review performed; medication list updated in electronic medical record  SDOH Over income for patient assistance for [employer] ([place], [place], [place]), [employer] ([place]) or [employer] ([place]). Denies cost concerns with medications at this time  Health Maintenance Yearly diabetic eye exam: up to date  Yearly diabetic foot exam: up to date Yearly influenza vaccination: due Td/Tdap vaccination: due Pneumonia vaccination: up to date COVID vaccinations: due for 4th booster, consider moving forward.   Shingrix vaccinations: due - will discuss moving forward Colonoscopy: up to date Bone density scan: up to date Mammogram: up to date  Diabetes: Uncontrolled but improving; current treatment: Ozempic 1 mg, Tresiba 12 QPM; Jardiance 25 mg daily, Novolog 16 units with breakfast/lunch and 20 units with lunch or supper  Hx metformin, but discontinued by endocrinology previously d/t elevated lactic acid and risk of lactic acidosis Calls today with confusion about Ozempic dosing as mychart message mentions to take 2 mg daily. She notes that she spoke with her local pharmacist about it and they instructed to do weekly as per the prescription Current glucose readings: Date of Download: using Libre CGM - some continued episodes of hypoglycemia after meals (2-3 hours after eating), though still persistent elevations ~ 1 hour after meals Reviewed Ozempic dosing. Due to hypoglycemia, reduce Tresiba to 10 units daily. Continue Jardiance 25 mg daily, Novolog 16/16/20 units with meals. Consider change from Novolog to [place] moving forward due to quicker onset of action.    Atrial Fibrillation, HTN, hx pulmonic valve replacement Appropriately managed, last clinic BP at goal;  current treatment: metoprolol succinate 25 mg daily Anticoagulant regimen: Eliquis 5 mg BID Holding furosemide d/t renal impact. Nephrology referral in place. Patient confirms that she has an appointment in October.  Recommended to continue current regimen at this time  Hyperlipidemia: Controlled per last lipid panel, though anticipated to improve given recent dose increase; current treatment: atorvastatin 80 mg daily  Previously recommended to continue current regimen at this time.  Addison's Disease Appropriately managed; current treatment; hydrocortisone 10 mg QAM, 5 mg QPM; fludrocortisone 0.1 mg daily Previously recommended to continue current regimen at this time  Depression/anxiety/insomnia: Appropriately managed per patient symptom report; current treatment: fluoxetine 10 mg daily, trazodone 150 mg QPM, mirtazapine 7.5 mg daily  Previously recommended to continue current regimen at this time.  Recommend to continue to monitor for s/sx serotonin syndrome given high serotonergic burden. Monitor weight gain, appetite w/ mirtazapine.   Hypothyroidism: Controlled per last lab result; levothyroxine 100 mcg daily Confirmed appropriate administration. Previously recommended to continue current regimen at this time  Osteoporosis: Untreated; Will discuss treatment, including appropriate calcium + vitamin D and falls risk reduction moving forward.  Last DEXA 2018 w/ osteopenia, FRAX scores did not meet criteria for treatment. Continue to monitor as appropriate.   Supplementation: Vitamin B12  Patient Goals/Self-Care Activities Over the next 90 days, patient will:  - take medications as prescribed check blood glucose at least three times daily using CGM, document, and provide at future appointments  Follow Up Plan: Telephone follow up appointment with care management team member scheduled for:~ 3 weeks as previously scheduled     Medication Assistance: None required.  Patient  affirms current coverage meets needs.  Patient's preferred pharmacy is:  [id number] [place] [place] [id number]: [phone]: [phone]   Follow Up:  Patient agrees to Care Plan and Follow-up.  Plan: Telephone follow up appointment with care management team member scheduled for:  ~ 3 weeks as previously scheduled  Catie TDarnelle Maffucci PharmD, BHow[place] City CGoddardClinical Pharmacist LOccidental Petroleumat BJohnson [employer] [phone]

## 2021-03-17 NOTE — Patient Instructions (Signed)
Visit Information  PATIENT GOALS:  Goals Addressed               This Visit's Progress     Patient Stated     Medication Management (pt-stated)        Patient Goals/Self-Care Activities Over the next 90 days, patient will:  - take medications as prescribed check blood glucose three times daily using CGM, document, and provide at future appointments        Patient verbalizes understanding of instructions provided today and agrees to view in [place].   Plan: Telephone follow up appointment with care management team member scheduled for:  ~ 3 weeks as previously scheduled  Catie Darnelle Maffucci, PharmD, [place], [place] Clinical Pharmacist [employer] at [employer] [phone]

## 2021-03-17 NOTE — Telephone Encounter (Signed)
Spoke with patient. See CCM documentation

## 2021-03-24 DIAGNOSIS — M1611 Unilateral primary osteoarthritis, right hip: Secondary | ICD-10-CM | POA: Diagnosis not present

## 2021-03-24 DIAGNOSIS — M25551 Pain in right hip: Secondary | ICD-10-CM | POA: Diagnosis not present

## 2021-03-30 DIAGNOSIS — I469 Cardiac arrest, cause unspecified: Secondary | ICD-10-CM | POA: Diagnosis not present

## 2021-03-30 DIAGNOSIS — I442 Atrioventricular block, complete: Secondary | ICD-10-CM | POA: Diagnosis not present

## 2021-03-30 DIAGNOSIS — Z950 Presence of cardiac pacemaker: Secondary | ICD-10-CM | POA: Diagnosis not present

## 2021-03-30 DIAGNOSIS — Z45018 Encounter for adjustment and management of other part of cardiac pacemaker: Secondary | ICD-10-CM | POA: Diagnosis not present

## 2021-04-07 ENCOUNTER — Ambulatory Visit (INDEPENDENT_AMBULATORY_CARE_PROVIDER_SITE_OTHER): Payer: HMO | Admitting: Pharmacist

## 2021-04-07 DIAGNOSIS — E1169 Type 2 diabetes mellitus with other specified complication: Secondary | ICD-10-CM

## 2021-04-07 DIAGNOSIS — E1165 Type 2 diabetes mellitus with hyperglycemia: Secondary | ICD-10-CM

## 2021-04-07 DIAGNOSIS — N1831 Chronic kidney disease, stage 3a: Secondary | ICD-10-CM

## 2021-04-07 DIAGNOSIS — Z794 Long term (current) use of insulin: Secondary | ICD-10-CM

## 2021-04-07 DIAGNOSIS — E785 Hyperlipidemia, unspecified: Secondary | ICD-10-CM

## 2021-04-07 NOTE — Chronic Care Management (AMB) (Signed)
Chronic Care Management Pharmacy Note  04/07/2021 Name:  Haley Young MRN:  [id number] DOB:  Nov 11, 1950   Subjective: Haley Young is an 70 y.o. year old female who is a primary patient of Derrel Nip, Aris Everts, MD.  The CCM team was consulted for assistance with disease management and care coordination needs.    Engaged with patient by telephone for follow up visit in response to provider referral for pharmacy case management and/or care coordination services.   Consent to Services:  The patient was given information about Chronic Care Management services, agreed to services, and gave verbal consent prior to initiation of services.  Please see initial visit note for detailed documentation.   Patient Care Team: [name], MD as PCP - General (Internal Medicine) De Hollingshead, RPH-CPP (Pharmacist)  Objective:  Lab Results  Component Value Date   CREATININE 1.27 (H) 03/09/2021   CREATININE 1.18 01/13/2021   CREATININE 1.16 (H) 06/12/2020    Lab Results  Component Value Date   HGBA1C 9.2 (H) 01/13/2021   Last diabetic Eye exam:  Lab Results  Component Value Date/Time   HMDIABEYEEXA No Retinopathy 12/21/2020 12:00 AM    Last diabetic Foot exam:  Lab Results  Component Value Date/Time   HMDIABFOOTEX Normal 09/13/2013 12:00 AM        Component Value Date/Time   CHOL 129 01/13/2021 1055   TRIG 90.0 01/13/2021 1055   HDL 50.80 01/13/2021 1055   CHOLHDL 3 01/13/2021 1055   VLDL 18.0 01/13/2021 1055   LDLCALC 60 01/13/2021 1055   LDLCALC 112 (H) 06/12/2020 1625   LDLDIRECT 165.0 04/14/2016 1012    Hepatic Function Latest Ref Rng & Units 01/13/2021 06/12/2020 03/20/2020  Total Protein 6.0 - 8.3 g/dL 6.8 7.3 7.3  Albumin 3.5 - 5.2 g/dL 3.9 - 4.0  AST 0 - 37 U/L 38(H) 36(H) 33  ALT 0 - 35 U/L 32 32(H) 25  Alk Phosphatase 39 - 117 U/L 148(H) - 85  Total Bilirubin 0.2 - 1.2 mg/dL 0.5 0.5 1.0  Bilirubin, Direct 0.0 - 0.2 mg/dL - - 0.2    Lab Results  Component  Value Date/Time   TSH 2.90 03/12/2021 10:35 AM   TSH 4.112 03/20/2020 01:32 PM   TSH 0.76 05/02/2019 11:02 AM   FREET4 1.71 (H) 01/25/2016 11:04 AM   FREET4 1.19 08/21/2015 10:49 AM    CBC Latest Ref Rng & Units 01/13/2021 03/20/2020 08/30/2018  WBC 4.0 - 10.5 K/uL 8.4 13.0(H) 17.0  Hemoglobin 12.0 - 15.0 g/dL 14.6 15.6(H) -  Hematocrit 36.0 - 46.0 % 44.6 48.9(H) -  Platelets 150.0 - 400.0 K/uL 143.0(L) 164 -    Lab Results  Component Value Date/Time   VD25OH 15.16 (L) 01/13/2021 10:55 AM   VD25OH 30.95 04/14/2016 10:12 AM     Social History   Tobacco Use  Smoking Status Never  Smokeless Tobacco Never   BP Readings from Last 3 Encounters:  03/12/21 114/76  01/13/21 112/72  06/12/20 102/64   Pulse Readings from Last 3 Encounters:  03/12/21 72  01/13/21 76  06/12/20 87   Wt Readings from Last 3 Encounters:  03/12/21 156 lb 3.2 oz (70.9 kg)  01/13/21 149 lb 3.2 oz (67.7 kg)  07/27/20 142 lb (64.4 kg)    Assessment: Review of patient past medical history, allergies, medications, health status, including review of consultants reports, laboratory and other test data, was performed as part of comprehensive evaluation and provision of chronic care management services.  SDOH:  (Social Determinants of Health) assessments and interventions performed:    CCM Care Plan  Allergies  Allergen Reactions   Oxycodone Hcl     REACTION: hallucinations   Albuterol Anxiety    Other reaction(s): Other (See Comments) Tremors    Oxycodone Hcl Other (See Comments)    halucinations    Medications Reviewed Today     Reviewed by De Hollingshead, RPH-CPP (Pharmacist) on 03/17/21 at 1333  Med List Status: <None>   Medication Order Taking? Sig Documenting Provider Last Dose Status Informant  apixaban (ELIQUIS) 5 MG TABS tablet [id number]  Take 1 tablet (5 mg total) by mouth 2 (two) times daily. [name], MD  Active   atorvastatin (LIPITOR) 80 MG tablet [id number]  TAKE 1  TABLET BY MOUTH EVERY DAY [name], MD  Active   Continuous Blood Gluc Receiver (FREESTYLE LIBRE 2 READER) [place] [id number]  Use to check glucose at least TID [name], MD  Active   Continuous Blood Gluc Sensor (FREESTYLE LIBRE 2 SENSOR) [place] [id number]  Use to check glucose at least TID [name], MD  Active   empagliflozin (JARDIANCE) 25 MG TABS tablet [id number]  Take 1 tablet (25 mg total) by mouth daily before breakfast. [name], MD  Active            Med Note Darnelle Maffucci, Arville Lime   Tue Dec 01, 2020  8:36 AM)    ergocalciferol (VITAMIN D2) 1.25 MG (50000 UT) capsule [id number]  Take 1 capsule (50,000 Units total) by mouth once a week. [name], MD  Active   Fludrocortisone Acetate (FLORINEF PO) [id number]  Take 0.1 mg by mouth daily. [provider]  Active   FLUoxetine (PROZAC) 10 MG tablet [id number]  TAKE 1 TABLET BY MOUTH EVERY DAY [name], MD  Active   furosemide (LASIX) 20 MG tablet [id number]  Take 20 mg by mouth as needed. [provider]  Active Self  hydrocortisone (CORTEF) 10 MG tablet [id number]  Take 5 mg by mouth 2 (two) times daily. 10 mg QAM, 5 PM [provider]  Active Self  insulin aspart (NOVOLOG FLEXPEN) 100 UNIT/ML FlexPen [id number] Yes Inject 16 units before  breakfast and lunchtime meals, and  20 units before supper [name], MD Taking Active   insulin degludec (TRESIBA FLEXTOUCH) 200 UNIT/ML FlexTouch Pen [id number]  Inject 10 Units into the skin daily. [name], MD  Active   levothyroxine (SYNTHROID) 100 MCG tablet [id number]  Take 100 mcg by mouth daily. [provider]  Active   metoprolol succinate (TOPROL-XL) 25 MG 24 hr tablet [id number]  Take 25 mg by mouth daily. [provider]  Active   mirtazapine (REMERON) 7.5 MG tablet [id number]  TAKE 1 TABLET BY MOUTH EVERYDAY AT BEDTIME [name], MD  Active   Probiotic Product (PROBIOTIC ADVANCED PO) [id number]  Take 1 capsule  by mouth daily. [provider]  Active   Semaglutide, 2 MG/DOSE, 8 MG/3ML SOPN [id number] Yes Inject 2 mg as directed once a week. [name], MD Taking Active   traZODone (DESYREL) 150 MG tablet [id number]  TAKE 1 TABLET BY MOUTH AT BEDTIME. [name], MD  Active             Patient Active Problem List   Diagnosis Date Noted   Right hip pain 03/12/2021   CKD (chronic kidney disease) stage 3, GFR 30-59 ml/min ([place]) 01/16/2021  Chronic left SI joint pain 05/01/2019   Transaminitis 05/01/2019   Educated about COVID-19 virus infection 11/15/2018   Anemia 07/06/2018   Chronic atrial fibrillation ([place]) 05/25/2018   Neuropathic pain of hand 05/03/2018   History of cardiac arrest 05/03/2018   Cerebral infarction, watershed distribution, bilateral, remote, resolved 54/62/7035   Chronic systolic heart failure ([place]) 04/26/2018   Dysphagia, oropharyngeal 04/26/2018   Chronic constipation 04/26/2018   Adrenal insufficiency (Addison's disease) ([place]) 01/20/2018   Hospital discharge follow-up 01/20/2018   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   CHB (complete heart block) ([place]) 08/17/2017   Decreased GFR 05/18/2017   Hyponatremia 11/01/2016   Drug-induced vitamin B12 deficiency anemia 04/16/2016   Type 2 diabetes mellitus with hyperglycemia, with long-term current use of insulin ([place]) 08/21/2015   Fatigue 03/27/2015   Overweight (BMI 25.0-29.9) 10/07/2014   Depression with anxiety 08/08/2014   Encounter for preventive health examination 09/13/2013   Hyperlipidemia associated with type 2 diabetes mellitus ([place]) 07/25/2013   Insomnia 07/25/2013   Hypothyroidism 03/29/2012   Screening for breast cancer 03/29/2012   Screening for colon cancer 03/29/2012   Depression, recurrent ([place]) 08/13/2008   GERD 08/13/2008   Osteopenia 08/13/2008   COLONIC POLYPS, HX OF 08/13/2008    Immunization History  Administered Date(s) Administered   Fluad Quad(high  Dose 65+) 06/12/2020   H1N1 08/13/2008   Influenza Split 07/28/2011, 06/28/2012   Influenza, High Dose Seasonal PF 04/14/2016, 07/10/2017, 04/11/2018, 05/07/2019   Influenza,inj,Quad PF,6+ Mos 07/25/2013, 08/21/2015   Influenza-Unspecified 07/22/2015   Moderna Sars-Covid-2 Vaccination 08/15/2019, 09/12/2019, 06/24/2020   PNEUMOCOCCAL CONJUGATE-20 01/13/2021   Pneumococcal Conjugate-13 09/13/2013   Pneumococcal Polysaccharide-23 07/30/2011   Zoster, Live 09/13/2013    Conditions to be addressed/monitored: HTN, HLD, and DMII  Care Plan : Medication Management  Updates made by De Hollingshead, RPH-CPP since 04/07/2021 12:00 AM     Problem: Diabetes, Addison's Disease, Atrial Fibrillation, Osteoporosis      Long-Range Goal: Disease Progression Prevention   Recent Progress: On track  Priority: High  Note:   Current Barriers:  Unable to independently monitor therapeutic efficacy Unable to achieve control of diabetes  Complex patient with multiple comorbidities, including diabetes, Addison's disease, atrial fibrillation, hx pulmonic valve replacement, osteoporosis  Pharmacist Clinical Goal(s):  Over the next 90 days, patient will verbalize ability to afford treatment regimen. Over the next 90 days, patient will achieve adherence to monitoring guidelines and medication adherence to achieve therapeutic efficacy. Over the next 90 days, patient will achieve control of diabetes as evidenced by  improvement in A1c through collaboration with PharmD and provider.   Interventions: 1:1 collaboration with [name], MD regarding development and update of comprehensive plan of care as evidenced by provider attestation and co-signature Inter-disciplinary care team collaboration (see longitudinal plan of care) Comprehensive medication review performed; medication list updated in electronic medical record  SDOH Over income for patient assistance for [employer] ([place], [place]), [employer] ([place]) or [employer] ([place]). Denies cost concerns with medications at this time  Health Maintenance Yearly diabetic eye exam: up to date Yearly diabetic foot exam: up to date Yearly influenza vaccination: due Td/Tdap vaccination: due Pneumonia vaccination: up to date COVID vaccinations: due for 4th booster, consider moving forward.   Shingrix vaccinations: due - will discuss moving forward Colonoscopy: up to date Bone density scan: up to date Mammogram: up to date  Diabetes: Uncontrolled but improving; current treatment: Ozempic 2 mg weekly, Tresiba 12 QPM- was supposed  to decrease to 10 units but she did not; Jardiance 25 mg daily, Novolog 16 units with breakfast/lunch and 20 units with lunch or supper  Hx metformin, but discontinued by endocrinology previously d/t elevated lactic acid and risk of lactic acidosis Current meal patterns: breakfast: grits, bagel, sometimes biscuit; lunch: chicken salad, tomato sandwich, sometimes soup; cheese toast; supper: salad - chef salad with ham, cheese, [place], tomato; broccoli soup; BBQ; drinks: flavored water, bottled water; no sugar sierra mist; unsweet tea Current glucose readings: using Libre 2 CGM Date of Download: 9/15-9/28/22 % Time CGM is active: 81% Average Glucose: 168 mg/dL Glucose Management Indicator: 168  Glucose Variability: 36.1 (goal <36%) Time in Goal:  - Time in range 70-180: 60% - Time above range: 39% - Time below range: 1% Observed patterns: occasional fasting lows, occasional post prandial highs  Admits that she occasionally misses Humalog doses prior to meals so she will take after. Reviewed the importance of taking Humalog prior to the meal for best pharmacokinetic match up with meal time elevations. Again discussed importance of incorporating protein into each meal to reduce risk of rapid glucose spikes. Patient reports she has a significant Novolog supply at home right now.  Moving forward, consider changing from Novolog to [place] due to quicker onset time. For now though, will focus on adherence. She will start leaving Novolog out of the fridge to help remind her, as the pen is good at room temperature of the length of time it will take her to use.  Discussed dietician referral for nutritional counseling. Patient amenable. Will discuss w/ PCP.  Reduce Tresiba to 10 units daily as previously discussed .   Atrial Fibrillation, HTN, hx pulmonic valve replacement Appropriately managed, last clinic BP at goal; current treatment: metoprolol succinate 25 mg daily Anticoagulant regimen: Eliquis 5 mg BID Holding furosemide d/t renal impact. Nephrology referral in place. Patient confirms that she has an appointment in October.  Previously recommended to continue current regimen at this time  Hyperlipidemia: Controlled per last lipid panel, though anticipated to improve given recent dose increase; current treatment: atorvastatin 80 mg daily  Previously recommended to continue current regimen at this time.  Addison's Disease Appropriately managed; current treatment; hydrocortisone 10 mg QAM, 5 mg QPM; fludrocortisone 0.1 mg daily Previously recommended to continue current regimen at this time  Depression/anxiety/insomnia: Appropriately managed per patient symptom report; current treatment: fluoxetine 10 mg daily, trazodone 150 mg QPM, mirtazapine 7.5 mg daily  Previously recommended to continue current regimen at this time.  Recommend to continue to monitor for s/sx serotonin syndrome given high serotonergic burden. Monitor weight gain, appetite w/ mirtazapine.   Hypothyroidism: Controlled per last lab result; levothyroxine 100 mcg daily Confirmed appropriate administration. Previously recommended to continue current regimen at this time  Osteoporosis: Untreated; Will discuss treatment, including appropriate calcium + vitamin D and falls risk reduction moving forward.   Last DEXA 2018 w/ osteopenia, FRAX scores did not meet criteria for treatment. Continue to monitor as appropriate.   Supplementation: Vitamin B12  Patient Goals/Self-Care Activities Over the next 90 days, patient will:  - take medications as prescribed check blood glucose at least three times daily using CGM, document, and provide at future appointments  Follow Up Plan: Telephone follow up appointment with care management team member scheduled for:~ 4 weeks     Medication Assistance: None required.  Patient affirms current coverage meets needs.  Patient's preferred pharmacy is:  [id number] [address], [address]  [place] [address] Phone: [phone] Fax: [phone]   Follow Up:  Patient agrees to Care Plan and Follow-up.  Plan: Telephone follow up appointment with care management team member scheduled for:  ~4 weeks  Catie Darnelle Maffucci, PharmD, [place], [place] Clinical Pharmacist [employer] [facility] [employer] [phone]

## 2021-04-07 NOTE — Patient Instructions (Signed)
Visit Information  PATIENT GOALS:  Goals Addressed               This Visit's Progress     Patient Stated     Medication Management (pt-stated)        Patient Goals/Self-Care Activities Over the next 90 days, patient will:  - take medications as prescribed check blood glucose three times daily using CGM, document, and provide at future appointments         Patient verbalizes understanding of instructions provided today and agrees to view in [place].    Plan: Telephone follow up appointment with care management team member scheduled for:  ~4 weeks  Catie Darnelle Maffucci, PharmD, [place], [place] Clinical Pharmacist [employer] at [employer] [phone]

## 2021-04-09 DIAGNOSIS — Z794 Long term (current) use of insulin: Secondary | ICD-10-CM | POA: Diagnosis not present

## 2021-04-09 DIAGNOSIS — E785 Hyperlipidemia, unspecified: Secondary | ICD-10-CM | POA: Diagnosis not present

## 2021-04-09 DIAGNOSIS — N1831 Chronic kidney disease, stage 3a: Secondary | ICD-10-CM | POA: Diagnosis not present

## 2021-04-09 DIAGNOSIS — E1169 Type 2 diabetes mellitus with other specified complication: Secondary | ICD-10-CM

## 2021-04-09 DIAGNOSIS — E1165 Type 2 diabetes mellitus with hyperglycemia: Secondary | ICD-10-CM

## 2021-04-19 DIAGNOSIS — I129 Hypertensive chronic kidney disease with stage 1 through stage 4 chronic kidney disease, or unspecified chronic kidney disease: Secondary | ICD-10-CM | POA: Diagnosis not present

## 2021-04-19 DIAGNOSIS — R8290 Unspecified abnormal findings in urine: Secondary | ICD-10-CM | POA: Diagnosis not present

## 2021-04-19 DIAGNOSIS — R809 Proteinuria, unspecified: Secondary | ICD-10-CM | POA: Diagnosis not present

## 2021-04-19 DIAGNOSIS — E1122 Type 2 diabetes mellitus with diabetic chronic kidney disease: Secondary | ICD-10-CM | POA: Diagnosis not present

## 2021-04-19 DIAGNOSIS — N1832 Chronic kidney disease, stage 3b: Secondary | ICD-10-CM | POA: Diagnosis not present

## 2021-04-21 ENCOUNTER — Telehealth: Payer: Self-pay | Admitting: Internal Medicine

## 2021-04-21 NOTE — Telephone Encounter (Signed)
Patient called and said her pharmacy told her that Ozempic 2.0 is on back order. She can not find it any where. She is out of her Ozempic.

## 2021-04-21 NOTE — Telephone Encounter (Signed)
Pen has been set aside. Patient has been notified and sample note in sample book on pharmacists desk.

## 2021-04-22 ENCOUNTER — Other Ambulatory Visit: Payer: Self-pay | Admitting: Internal Medicine

## 2021-04-27 ENCOUNTER — Other Ambulatory Visit: Payer: Self-pay | Admitting: Nephrology

## 2021-04-27 ENCOUNTER — Telehealth: Payer: Self-pay | Admitting: Internal Medicine

## 2021-04-27 DIAGNOSIS — R809 Proteinuria, unspecified: Secondary | ICD-10-CM

## 2021-04-27 DIAGNOSIS — N1832 Chronic kidney disease, stage 3b: Secondary | ICD-10-CM

## 2021-04-27 DIAGNOSIS — R8290 Unspecified abnormal findings in urine: Secondary | ICD-10-CM

## 2021-04-27 NOTE — Telephone Encounter (Signed)
Patient is calling in to talk to you about her ultrasound for her kidneys and she also has other questions.Please call her at [phone].

## 2021-04-28 NOTE — Telephone Encounter (Signed)
Spoke with pt and she stated that when she went to the nephrologist he kept telling her that she really needed to have an endocrinologist. She told the doctor that the reason she does not have one is because she has not been able to find one that knows much about addison's disease and that her PCP and pharmacist are doing a great job at managing her diabetes. Pt stated that he told her he would find her an endocrinologist but she called back and the ball was dropped. Pt is wanting to see if you knew of any one in the [place] area or the [place] area that maybe specializes in Addison's disease, she stated that would be the only reason she would want to see an endocrinologist.

## 2021-04-29 ENCOUNTER — Other Ambulatory Visit: Payer: Self-pay | Admitting: Internal Medicine

## 2021-05-03 ENCOUNTER — Ambulatory Visit (INDEPENDENT_AMBULATORY_CARE_PROVIDER_SITE_OTHER): Payer: HMO | Admitting: Internal Medicine

## 2021-05-03 ENCOUNTER — Encounter: Payer: Self-pay | Admitting: Internal Medicine

## 2021-05-03 ENCOUNTER — Other Ambulatory Visit: Payer: Self-pay

## 2021-05-03 VITALS — BP 118/76 | HR 69 | Temp 95.6°F | Ht 60.98 in | Wt 160.2 lb

## 2021-05-03 DIAGNOSIS — E310 Autoimmune polyglandular failure: Secondary | ICD-10-CM | POA: Diagnosis not present

## 2021-05-03 DIAGNOSIS — E559 Vitamin D deficiency, unspecified: Secondary | ICD-10-CM

## 2021-05-03 DIAGNOSIS — E039 Hypothyroidism, unspecified: Secondary | ICD-10-CM | POA: Diagnosis not present

## 2021-05-03 DIAGNOSIS — R5382 Chronic fatigue, unspecified: Secondary | ICD-10-CM

## 2021-05-03 DIAGNOSIS — E538 Deficiency of other specified B group vitamins: Secondary | ICD-10-CM | POA: Diagnosis not present

## 2021-05-03 DIAGNOSIS — R0609 Other forms of dyspnea: Secondary | ICD-10-CM | POA: Diagnosis not present

## 2021-05-03 DIAGNOSIS — Z8673 Personal history of transient ischemic attack (TIA), and cerebral infarction without residual deficits: Secondary | ICD-10-CM

## 2021-05-03 DIAGNOSIS — R748 Abnormal levels of other serum enzymes: Secondary | ICD-10-CM | POA: Diagnosis not present

## 2021-05-03 DIAGNOSIS — E271 Primary adrenocortical insufficiency: Secondary | ICD-10-CM | POA: Diagnosis not present

## 2021-05-03 DIAGNOSIS — E1122 Type 2 diabetes mellitus with diabetic chronic kidney disease: Secondary | ICD-10-CM | POA: Diagnosis not present

## 2021-05-03 DIAGNOSIS — F339 Major depressive disorder, recurrent, unspecified: Secondary | ICD-10-CM | POA: Diagnosis not present

## 2021-05-03 DIAGNOSIS — N183 Chronic kidney disease, stage 3 unspecified: Secondary | ICD-10-CM

## 2021-05-03 DIAGNOSIS — E1169 Type 2 diabetes mellitus with other specified complication: Secondary | ICD-10-CM

## 2021-05-03 DIAGNOSIS — E785 Hyperlipidemia, unspecified: Secondary | ICD-10-CM | POA: Diagnosis not present

## 2021-05-03 LAB — CBC WITH DIFFERENTIAL/PLATELET
Basophils Absolute: 0 10*3/uL (ref 0.0–0.1)
Basophils Relative: 0.6 % (ref 0.0–3.0)
Eosinophils Absolute: 0.3 10*3/uL (ref 0.0–0.7)
Eosinophils Relative: 4.2 % (ref 0.0–5.0)
HCT: 42.1 % (ref 36.0–46.0)
Hemoglobin: 13.2 g/dL (ref 12.0–15.0)
Lymphocytes Relative: 24.7 % (ref 12.0–46.0)
Lymphs Abs: 2 10*3/uL (ref 0.7–4.0)
MCHC: 31.3 g/dL (ref 30.0–36.0)
MCV: 80.8 fl (ref 78.0–100.0)
Monocytes Absolute: 0.9 10*3/uL (ref 0.1–1.0)
Monocytes Relative: 11 % (ref 3.0–12.0)
Neutro Abs: 4.7 10*3/uL (ref 1.4–7.7)
Neutrophils Relative %: 59.5 % (ref 43.0–77.0)
Platelets: 153 10*3/uL (ref 150.0–400.0)
RBC: 5.21 Mil/uL — ABNORMAL HIGH (ref 3.87–5.11)
RDW: 15.7 % — ABNORMAL HIGH (ref 11.5–15.5)
WBC: 7.9 10*3/uL (ref 4.0–10.5)

## 2021-05-03 LAB — HEMOGLOBIN A1C: Hgb A1c MFr Bld: 8.3 % — ABNORMAL HIGH (ref 4.6–6.5)

## 2021-05-03 LAB — COMPREHENSIVE METABOLIC PANEL
ALT: 35 U/L (ref 0–35)
AST: 43 U/L — ABNORMAL HIGH (ref 0–37)
Albumin: 3.7 g/dL (ref 3.5–5.2)
Alkaline Phosphatase: 207 U/L — ABNORMAL HIGH (ref 39–117)
BUN: 12 mg/dL (ref 6–23)
CO2: 33 mEq/L — ABNORMAL HIGH (ref 19–32)
Calcium: 9.1 mg/dL (ref 8.4–10.5)
Chloride: 103 mEq/L (ref 96–112)
Creatinine, Ser: 0.97 mg/dL (ref 0.40–1.20)
GFR: 58.27 mL/min — ABNORMAL LOW (ref >60.00)
Glucose, Bld: 99 mg/dL (ref 70–99)
Potassium: 3.8 mEq/L (ref 3.5–5.1)
Sodium: 144 mEq/L (ref 135–145)
Total Bilirubin: 0.6 mg/dL (ref 0.2–1.2)
Total Protein: 6.7 g/dL (ref 6.0–8.3)

## 2021-05-03 LAB — LIPID PANEL
Cholesterol: 103 mg/dL (ref 0–200)
HDL: 43.4 mg/dL (ref >39.00)
LDL Cholesterol: 39 mg/dL (ref 0–99)
NonHDL: 59.9
Total CHOL/HDL Ratio: 2
Triglycerides: 106 mg/dL (ref 0.0–149.0)
VLDL: 21.2 mg/dL (ref 0.0–40.0)

## 2021-05-03 LAB — VITAMIN D 25 HYDROXY (VIT D DEFICIENCY, FRACTURES): VITD: 14.62 ng/mL — ABNORMAL LOW (ref 30.00–100.00)

## 2021-05-03 LAB — VITAMIN B12: Vitamin B-12: >1550 pg/mL — ABNORMAL HIGH (ref 211–911)

## 2021-05-03 MED ORDER — ZOSTER VAC RECOMB ADJUVANTED 50 MCG/0.5ML IM SUSR: 0.5000 mL | Freq: Once | INTRAMUSCULAR | 1 refills | Status: AC

## 2021-05-03 MED ORDER — MIRTAZAPINE 15 MG PO TABS: 15.0000 mg | ORAL_TABLET | Freq: Every day | ORAL | 1 refills | Status: DC

## 2021-05-03 NOTE — Assessment & Plan Note (Signed)
Taking  10 mg HC in the morning and 5 mg at night,  As well as  0.1 mg florinef . Referral in process

## 2021-05-03 NOTE — Patient Instructions (Addendum)
Tell Dr Leonides Schanz you are short of breath with minimal activity and are gaining weight   Dr Derrel Nip has increased my  furosemide dose to once daily and wants to order cardiac rehab but last ECHO was Jan 2019.  And noted low EF and severe pulmonary hypertension     Referral to Dr Honor Junes at [facility] [facility] for addison's     INCREASE MIRTAZIPINE TO 15 MG [place] (FLUOXETINE)

## 2021-05-03 NOTE — Progress Notes (Signed)
Subjective:  Patient ID: Haley Young, female    DOB: [birth year]  Age: 70 y.o. 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 Diagnoses of Adrenal insufficiency (Addison's disease) ([place]), B12 deficiency, Vitamin D deficiency, Hyperlipidemia associated with type 2 diabetes mellitus ([place]), Cerebral infarction, watershed distribution, bilateral, remote, resolved, Acquired hypothyroidism, Depression, recurrent ([place]), CKD stage 3 due to type 2 diabetes mellitus ([place]), Chronic fatigue, Exertional dyspnea, and Elevated liver enzymes were also pertinent to this visit.  HPI MEZTLI LLANAS presents for follow up on multiple issues  Chief Complaint  Patient presents with   Follow-up    This visit occurred during the SARS-CoV-2 public health emergency.  Safety protocols were in place, including screening questions prior to the visit, additional usage of staff PPE, and extensive cleaning of exam room while observing appropriate contact time as indicated for disinfecting solutions.   Cc: she continues to report profound fatigue and dyspnea with minimal exertion; and becomes  gets short of breath walking from the handicapped parking space to the inside of any building  She denies chest pain. She .   Sleeps on 2 pillows chronically  , and no history of sleep apnea.  She has a history of  heart failure but takes furosemide 20 mg as needed for fluid retention and does not weigh daily.  Last available ECHO for review is from 2019 and notes severe pulmonary hypertension and global hypokinesis with EF 40% . Has not followed up with her cardiologist yet.  No appetite but gaining weight.   Denies hypotension,  taking florinef 0.1 mg daily and hydrocortisone 10 mg bid   Last meal 8 am .  Taking lipitor.   Saw Nephrology for CKD,  advised to see an endocrinologist for management of multiple conditions/ Schmidt Syndrome diagnosed during last prolonged hospitalization .   Renal ultrasound  was also ordered  Type 2 DM: did not bring BS to today's appt  but notes that they are improving. Taking novolog tid (16, 16 and 20 units qac). 12 units Tresiba, Ozempic 2 mg weekly and Jardiance 25 mg daily and using CBG monitor    Outpatient Medications Prior to Visit  Medication Sig Dispense Refill   apixaban (ELIQUIS) 5 MG TABS tablet Take 1 tablet (5 mg total) by mouth 2 (two) times daily. 60 tablet 11   atorvastatin (LIPITOR) 80 MG tablet TAKE 1 TABLET BY MOUTH EVERY DAY 90 tablet 1   Continuous Blood Gluc Receiver (FREESTYLE LIBRE 2 READER) DEVI Use to check glucose at least TID 1 each 1   Continuous Blood Gluc Sensor (FREESTYLE LIBRE 2 SENSOR) MISC Use to check glucose at least TID 2 each 11   empagliflozin (JARDIANCE) 25 MG TABS tablet Take 1 tablet (25 mg total) by mouth daily before breakfast. 90 tablet 3   ergocalciferol (VITAMIN D2) 1.25 MG (50000 UT) capsule Take 1 capsule (50,000 Units total) by mouth once a week. 4 capsule 5   Fludrocortisone Acetate (FLORINEF PO) Take 0.1 mg by mouth daily.     furosemide (LASIX) 20 MG tablet Take 20 mg by mouth as needed.     hydrocortisone (CORTEF) 10 MG tablet Take 5 mg by mouth 2 (two) times daily. 10 mg QAM, 5 PM     insulin aspart (NOVOLOG FLEXPEN) 100 UNIT/ML FlexPen Inject 16 units before  breakfast and lunchtime meals, and  20 units before supper 30 mL 3   insulin degludec (TRESIBA FLEXTOUCH) 200 UNIT/ML FlexTouch Pen  Inject 10 Units into the skin daily. 15 mL 2   levothyroxine (SYNTHROID) 100 MCG tablet Take 100 mcg by mouth daily.     metoprolol succinate (TOPROL-XL) 25 MG 24 hr tablet Take 25 mg by mouth daily.     Probiotic Product (PROBIOTIC ADVANCED PO) Take 1 capsule by mouth daily.     Semaglutide, 2 MG/DOSE, 8 MG/3ML SOPN Inject 2 mg as directed once a week. 3 mL 2   traZODone (DESYREL) 150 MG tablet TAKE 1 TABLET BY MOUTH AT BEDTIME. 90 tablet 1   FLUoxetine (PROZAC) 10 MG tablet TAKE 1 TABLET BY MOUTH EVERY DAY 90 tablet 1    mirtazapine (REMERON) 7.5 MG tablet TAKE 1 TABLET BY MOUTH EVERYDAY AT BEDTIME 90 tablet 1   No facility-administered medications prior to visit.    Review of Systems;  Patient denies headache, fevers, malaise, unintentional weight loss, skin rash, eye pain, sinus congestion and sinus pain, sore throat, dysphagia,  hemoptysis , cough, , wheezing, chest pain, palpitations, orthopnea, edema, abdominal pain, nausea, melena, diarrhea, constipation, flank pain, dysuria, hematuria, urinary  Frequency, nocturia, numbness, tingling, seizures,  Focal weakness, Loss of consciousness,  Tremor, insomnia, depression, anxiety, and suicidal ideation.      Objective:  BP 118/76   Pulse 69   Temp (!) 95.6 F (35.3 C)   Ht 5' 0.98" (1.549 m)   Wt 160 lb 3.2 oz (72.7 kg)   SpO2 95%   BMI 30.29 kg/m   BP Readings from Last 3 Encounters:  05/03/21 118/76  03/12/21 114/76  01/13/21 112/72    Wt Readings from Last 3 Encounters:  05/03/21 160 lb 3.2 oz (72.7 kg)  03/12/21 156 lb 3.2 oz (70.9 kg)  01/13/21 149 lb 3.2 oz (67.7 kg)    General appearance: alert, cooperative and appears stated age Ears: normal TM's and external ear canals both ears Throat: lips, mucosa, and tongue normal; teeth and gums normal Neck: no adenopathy, no carotid bruit, supple, symmetrical, trachea midline and thyroid not enlarged, symmetric, no tenderness/mass/nodules Back: symmetric, no curvature. ROM normal. No CVA tenderness. Lungs: clear to auscultation bilaterally Heart: regular rate and rhythm, S1, S2 normal, no murmur, click, rub or gallop Abdomen: soft, non-tender; bowel sounds normal; no masses,  no organomegaly Pulses: 2+ and symmetric Skin: Skin color, texture, turgor normal. No rashes or lesions Lymph nodes: Cervical, supraclavicular, and axillary nodes normal.  Lab Results  Component Value Date   HGBA1C 8.3 (H) 05/03/2021   HGBA1C 9.2 (H) 01/13/2021   HGBA1C 13.2 (H) 06/12/2020    Lab Results   Component Value Date   CREATININE 0.97 05/03/2021   CREATININE 1.27 (H) 03/09/2021   CREATININE 1.18 01/13/2021    Lab Results  Component Value Date   WBC 7.9 05/03/2021   HGB 13.2 05/03/2021   HCT 42.1 05/03/2021   PLT 153.0 05/03/2021   GLUCOSE 99 05/03/2021   CHOL 103 05/03/2021   TRIG 106.0 05/03/2021   HDL 43.40 05/03/2021   LDLDIRECT 165.0 04/14/2016   LDLCALC 39 05/03/2021   ALT 35 05/03/2021   AST 43 (H) 05/03/2021   NA 144 05/03/2021   K 3.8 05/03/2021   CL 103 05/03/2021   CREATININE 0.97 05/03/2021   BUN 12 05/03/2021   CO2 33 (H) 05/03/2021   TSH 2.90 03/12/2021   INR 1.5 (H) 03/20/2020   HGBA1C 8.3 (H) 05/03/2021   MICROALBUR 0.5 06/12/2020      Assessment & Plan:   Problem List Items Addressed This  Visit     Depression, recurrent ([place])    Tolerating low dose of mirtazapine. Increasing dose today to 15 mg to improve appetite and energy level and stopping fluoxetine.  Continue trazodone for chronic insomnia       Relevant Medications   mirtazapine (REMERON) 15 MG tablet   Hypothyroidism    Thyroid function is WNL on current dose.  No current changes needed.   Lab Results  Component Value Date   TSH 2.90 03/12/2021         Hyperlipidemia associated with type 2 diabetes mellitus ([place])    Improving glycemic control with increased doses of Novology (16, 16 and 20) ,  Increased Tresiba (12 Units), use of jardiance and Ozempic,  But not at goal yet.  Follow up with Catie Darnelle Maffucci   Lab Results  Component Value Date   HGBA1C 8.3 (H) 05/03/2021         Fatigue    Multifactorial with CHF,  Chronic atrial fib with CHB s/p pacemaker,  Depression, Addison's disease  and potentially nocturnal hypoxia contributing,  (she has declined a sleep study repeatedly).  Needs ore recent ECHO (last one in chart 2019) repeated by her cardiologist before I can order cardiopulmonary rehab. Ambulatory sats on room air today were done      CKD stage 3 due to type 2  diabetes mellitus (HCC)    GFR  Fluctuates with volume status. Continue nephrology workup, Jardiance et al for improved glycemic control and consider adding ARB if BP can tolerate   Lab Results  Component Value Date   MICROALBUR 0.5 06/12/2020   MICROALBUR 3.0 (H) 05/02/2019     Lab Results  Component Value Date   CREATININE 0.97 05/03/2021         Adrenal insufficiency (Addison's disease) (HCC)    Taking  10 mg HC in the morning and 5 mg at night,  As well as  0.1 mg florinef . Referral in process       Cerebral infarction, watershed distribution, bilateral, remote, resolved   Schmidt's syndrome ([place]) - Primary    She has not had enocrinology follow up since 2020. Referral to local [facility]ology at [facility] in process to facilitate management of addison's, hypothyridi and type 2 dM      Relevant Orders   Ambulatory referral to Endocrinology   Mitochondrial antibodies   US Abdomen Limited   Alkaline phosphatase, isoenzymes   Alpha-1-antitrypsin   ANA   AntiMicrosomal Ab-Liver / Kidney   Anti-Smith antibody   Anti-smooth muscle antibody, IgG   Ceruloplasmin   Hepatitis B core antibody, total   Hepatitis C antibody   Exertional dyspnea    She is very symptomatic with minimal exertion.  ambulatory sats dropped slightly from 97% to 94% after one lap around the office.   She has severe pulmonary hypertension, global hypokinesis with EF 40% by 2019 ECHO.  Advised to follow up with her cardiologist for repeat assessment.  Cardiopulmonary rehab referral pending new ECHO      Elevated liver enzymes    Etiology unclear.  There has been no recent imaging of liver and given her history of Schmidt's syndrome,  Must rule out autoimmune causes.  Serologies and [place] ordered       Relevant Orders   Mitochondrial antibodies   US Abdomen Limited   Alkaline phosphatase, isoenzymes   Alpha-1-antitrypsin   ANA   AntiMicrosomal Ab-Liver / Kidney   Anti-Smith antibody    Anti-smooth muscle antibody, IgG  Ceruloplasmin   Hepatitis B core antibody, total   Hepatitis C antibody   Other Visit Diagnoses     B12 deficiency       Vitamin D deficiency           I have discontinued Zigmund Daniel S. Spalla's FLUoxetine. I have also changed her mirtazapine. Additionally, I am having her start on Zoster Vaccine Adjuvanted. Lastly, I am having her maintain her hydrocortisone, furosemide, apixaban, Fludrocortisone Acetate (FLORINEF PO), metoprolol succinate, levothyroxine, Probiotic Product (PROBIOTIC ADVANCED PO), FreeStyle Libre 2 Sensor, [employer] 2 Reader, empagliflozin, ergocalciferol, atorvastatin, traZODone, NovoLOG FlexPen, Tresiba FlexTouch, and Semaglutide (2 MG/DOSE).  Meds ordered this encounter  Medications   mirtazapine (REMERON) 15 MG tablet    Sig: Take 1 tablet (15 mg total) by mouth at bedtime.    Dispense:  90 tablet    Refill:  1    PATIENT JUST REFILLED 7.5MG  DOSE 2 WEEKS AGO  PLEASE KEEP ON FILE FOR FUTURE   Zoster Vaccine Adjuvanted [facility]) injection    Sig: Inject 0.5 mLs into the muscle once for 1 dose.    Dispense:  1 each    Refill:  1    Medications Discontinued During This Encounter  Medication Reason   FLUoxetine (PROZAC) 10 MG tablet    mirtazapine (REMERON) 7.5 MG tablet    I provided  40 minutes of  face-to-face time during this encounter reviewing patient's current problems and past surgeries, labs and imaging studies, providing counseling on the above mentioned problems , and coordination  of care .   Follow-up: Return in about 3 months (around 08/03/2021) for follow up diabetes.   [name], MD

## 2021-05-04 DIAGNOSIS — R0609 Other forms of dyspnea: Secondary | ICD-10-CM | POA: Insufficient documentation

## 2021-05-04 DIAGNOSIS — R748 Abnormal levels of other serum enzymes: Secondary | ICD-10-CM | POA: Insufficient documentation

## 2021-05-04 NOTE — Assessment & Plan Note (Signed)
Etiology unclear.  There has been no recent imaging of liver and given her history of Schmidt's syndrome,  Must rule out autoimmune causes.  Serologies and [place] ordered

## 2021-05-04 NOTE — Assessment & Plan Note (Signed)
Thyroid function is WNL on current dose.  No current changes needed.   Lab Results  Component Value Date   TSH 2.90 03/12/2021

## 2021-05-04 NOTE — Assessment & Plan Note (Signed)
Multifactorial with CHF,  Chronic atrial fib with CHB s/p pacemaker,  Depression, Addison's disease  and potentially nocturnal hypoxia contributing,  (she has declined a sleep study repeatedly).  Needs ore recent ECHO (last one in chart 2019) repeated by her cardiologist before I can order cardiopulmonary rehab. Ambulatory sats on room air today were done

## 2021-05-04 NOTE — Assessment & Plan Note (Signed)
She is very symptomatic with minimal exertion.  ambulatory sats dropped slightly from 97% to 94% after one lap around the office.   She has severe pulmonary hypertension, global hypokinesis with EF 40% by 2019 ECHO.  Advised to follow up with her cardiologist for repeat assessment.  Cardiopulmonary rehab referral pending new ECHO

## 2021-05-04 NOTE — Assessment & Plan Note (Signed)
She has not had enocrinology follow up since 2020. Referral to local endocrinology at [facility] in process to facilitate management of addison's, hypothyridi and type 2 dM

## 2021-05-04 NOTE — Assessment & Plan Note (Signed)
GFR  Fluctuates with volume status. Continue nephrology workup, Jardiance et al for improved glycemic control and consider adding ARB if BP can tolerate   Lab Results  Component Value Date   MICROALBUR 0.5 06/12/2020   MICROALBUR 3.0 (H) 05/02/2019     Lab Results  Component Value Date   CREATININE 0.97 05/03/2021

## 2021-05-04 NOTE — Assessment & Plan Note (Signed)
Improving glycemic control with increased doses of Novology (16, 16 and 20) ,  Increased Tresiba (12 Units), use of jardiance and Ozempic,  But not at goal yet.  Follow up with [facility]   Lab Results  Component Value Date   HGBA1C 8.3 (H) 05/03/2021

## 2021-05-04 NOTE — Assessment & Plan Note (Addendum)
Tolerating low dose of mirtazapine. Increasing dose today to 15 mg to improve appetite and energy level and stopping fluoxetine.  Continue trazodone for chronic insomnia

## 2021-05-10 ENCOUNTER — Ambulatory Visit
Admission: RE | Admit: 2021-05-10 | Discharge: 2021-05-10 | Disposition: A | Discharge status: Home / Self Care | Payer: HMO | Source: Ambulatory Visit | Attending: Nephrology | Admitting: Nephrology

## 2021-05-10 ENCOUNTER — Other Ambulatory Visit: Payer: Self-pay

## 2021-05-10 DIAGNOSIS — R8290 Unspecified abnormal findings in urine: Secondary | ICD-10-CM | POA: Diagnosis not present

## 2021-05-10 DIAGNOSIS — R809 Proteinuria, unspecified: Secondary | ICD-10-CM | POA: Insufficient documentation

## 2021-05-10 DIAGNOSIS — N1832 Chronic kidney disease, stage 3b: Secondary | ICD-10-CM | POA: Insufficient documentation

## 2021-05-10 DIAGNOSIS — N189 Chronic kidney disease, unspecified: Secondary | ICD-10-CM | POA: Diagnosis not present

## 2021-05-12 ENCOUNTER — Ambulatory Visit
Admission: RE | Admit: 2021-05-12 | Discharge: 2021-05-12 | Disposition: A | Discharge status: Home / Self Care | Payer: HMO | Source: Ambulatory Visit | Attending: Internal Medicine | Admitting: Internal Medicine

## 2021-05-12 ENCOUNTER — Other Ambulatory Visit: Payer: Self-pay

## 2021-05-12 DIAGNOSIS — R945 Abnormal results of liver function studies: Secondary | ICD-10-CM | POA: Diagnosis not present

## 2021-05-12 DIAGNOSIS — R748 Abnormal levels of other serum enzymes: Secondary | ICD-10-CM | POA: Diagnosis not present

## 2021-05-12 DIAGNOSIS — E310 Autoimmune polyglandular failure: Secondary | ICD-10-CM | POA: Insufficient documentation

## 2021-05-13 DIAGNOSIS — I480 Paroxysmal atrial fibrillation: Secondary | ICD-10-CM | POA: Diagnosis not present

## 2021-05-13 DIAGNOSIS — I442 Atrioventricular block, complete: Secondary | ICD-10-CM | POA: Diagnosis not present

## 2021-05-13 DIAGNOSIS — I371 Nonrheumatic pulmonary valve insufficiency: Secondary | ICD-10-CM | POA: Diagnosis not present

## 2021-05-13 DIAGNOSIS — Q221 Congenital pulmonary valve stenosis: Secondary | ICD-10-CM | POA: Diagnosis not present

## 2021-05-13 DIAGNOSIS — R0602 Shortness of breath: Secondary | ICD-10-CM | POA: Diagnosis not present

## 2021-05-13 DIAGNOSIS — I10 Essential (primary) hypertension: Secondary | ICD-10-CM | POA: Diagnosis not present

## 2021-05-13 DIAGNOSIS — E782 Mixed hyperlipidemia: Secondary | ICD-10-CM | POA: Diagnosis not present

## 2021-05-14 ENCOUNTER — Encounter: Payer: Self-pay | Admitting: Emergency Medicine

## 2021-05-14 ENCOUNTER — Ambulatory Visit
Admission: EM | Admit: 2021-05-14 | Discharge: 2021-05-14 | Disposition: A | Discharge status: Home / Self Care | Payer: HMO | Attending: Emergency Medicine | Admitting: Emergency Medicine

## 2021-05-14 ENCOUNTER — Other Ambulatory Visit: Payer: Self-pay

## 2021-05-14 ENCOUNTER — Ambulatory Visit (INDEPENDENT_AMBULATORY_CARE_PROVIDER_SITE_OTHER): Payer: HMO

## 2021-05-14 DIAGNOSIS — M25532 Pain in left wrist: Secondary | ICD-10-CM

## 2021-05-14 DIAGNOSIS — M7989 Other specified soft tissue disorders: Secondary | ICD-10-CM | POA: Diagnosis not present

## 2021-05-14 DIAGNOSIS — M79642 Pain in left hand: Secondary | ICD-10-CM

## 2021-05-14 NOTE — ED Triage Notes (Signed)
Patient states that she was seen at a clinic yesterday and when she went to sit down on the stool it rolled out from her and she fell on the ground.  Patient states that she injured her left hand.  Patient has swelling, pain and bruising in her left hand.

## 2021-05-14 NOTE — ED Provider Notes (Signed)
[facility]    CSN: [id number] Arrival date & time: 05/14/21  [place]      History   Chief Complaint Chief Complaint  Patient presents with   Hand Pain    left    HPI Haley Young is a 70 y.o. female.   HPI  Hand Pain: Patient reports that yesterday she fell about 2 feet off of a stool onto a hard floor.  She states that this was a [place] injury of her left hand.  Since then she has had left wrist and hand pain.  She also has some fairly significant edema and bruising of the area.  She has taken Tylenol which has helped with her pain reduction.  No numbness, tingling or skin breakdown.  Past Medical History:  Diagnosis Date   Asthma    Chicken pox    Depression    Diabetes mellitus    Frequent UTI    GERD (gastroesophageal reflux disease)    Heart disease    Heart murmur    Heart valve replaced    heart valve/pulmonary - replacement    Hx of colonic polyps    Hyperhidrosis    especially with hot flashes     Hyperlipidemia    Lipoma    R axilla    MRSA cellulitis    surgical wound infection   Osteoporosis    Pyelocystitis    as a child    Thyroid disease    Urinary incontinence     Patient Active Problem List   Diagnosis Date Noted   Exertional dyspnea 05/04/2021   Elevated liver enzymes 05/04/2021   Schmidt's syndrome ([place]) 05/03/2021   Right hip pain 03/12/2021   CKD (chronic kidney disease) stage 3, GFR 30-59 ml/min ([place]) 01/16/2021   Chronic left SI joint pain 05/01/2019   Transaminitis 05/01/2019   Educated about COVID-19 virus infection 11/15/2018   Anemia 07/06/2018   Chronic atrial fibrillation ([place]) 05/25/2018   Neuropathic pain of hand 05/03/2018   History of cardiac arrest 05/03/2018   Cerebral infarction, watershed distribution, bilateral, remote, resolved 01/77/9390   Chronic systolic heart failure ([place]) 04/26/2018   Dysphagia, oropharyngeal 04/26/2018   Chronic constipation 04/26/2018   Adrenal insufficiency (Addison's disease)  ([place]) 01/20/2018   Hospital discharge follow-up 01/20/2018   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   CHB (complete heart block) ([place]) 08/17/2017   CKD stage 3 due to type 2 diabetes mellitus ([place]) 05/18/2017   Hyponatremia 11/01/2016   Drug-induced vitamin B12 deficiency anemia 04/16/2016   Fatigue 03/27/2015   Overweight (BMI 25.0-29.9) 10/07/2014   Encounter for preventive health examination 09/13/2013   Hyperlipidemia associated with type 2 diabetes mellitus ([place]) 07/25/2013   Insomnia 07/25/2013   Hypothyroidism 03/29/2012   Screening for breast cancer 03/29/2012   Screening for colon cancer 03/29/2012   Depression, recurrent ([place]) 08/13/2008   GERD 08/13/2008   Osteopenia 08/13/2008   COLONIC POLYPS, HX OF 08/13/2008    Past Surgical History:  Procedure Laterality Date   [place], 2005   pulmonary stenosis with valve replacement, Dr. Evelina Dun   PARTIAL HYSTERECTOMY  1970   PULMONARY VEIN STENOSIS REPAIR     VALVE REPLACEMENT      OB History   No obstetric history on file.      Home Medications    Prior to Admission medications   Medication Sig Start Date End Date Taking? Authorizing Provider  apixaban (ELIQUIS) 5 MG  TABS tablet Take 1 tablet (5 mg total) by mouth 2 (two) times daily. 05/23/18  Yes [name], MD  atorvastatin (LIPITOR) 80 MG tablet TAKE 1 TABLET BY MOUTH EVERY DAY 03/10/21  Yes [name], MD  empagliflozin (JARDIANCE) 25 MG TABS tablet Take 1 tablet (25 mg total) by mouth daily before breakfast. 10/20/20  Yes [name], MD  ergocalciferol (VITAMIN D2) 1.25 MG (50000 UT) capsule Take 1 capsule (50,000 Units total) by mouth once a week. 02/23/21  Yes [name], MD  Fludrocortisone Acetate (FLORINEF PO) Take 0.1 mg by mouth daily.   Yes [provider]  furosemide (LASIX) 20 MG tablet Take 20 mg by mouth as needed.   Yes [provider]  hydrocortisone  (CORTEF) 10 MG tablet Take 5 mg by mouth 2 (two) times daily. 10 mg QAM, 5 PM   Yes [provider]  insulin aspart (NOVOLOG FLEXPEN) 100 UNIT/ML FlexPen Inject 16 units before  breakfast and lunchtime meals, and  20 units before supper 03/12/21  Yes [name], MD  insulin degludec (TRESIBA FLEXTOUCH) 200 UNIT/ML FlexTouch Pen Inject 10 Units into the skin daily. 03/12/21  Yes [name], MD  levothyroxine (SYNTHROID) 100 MCG tablet Take 100 mcg by mouth daily. 02/08/20  Yes [provider]  metoprolol succinate (TOPROL-XL) 25 MG 24 hr tablet Take 25 mg by mouth daily. 03/15/20  Yes [provider]  mirtazapine (REMERON) 15 MG tablet Take 1 tablet (15 mg total) by mouth at bedtime. 05/03/21  Yes [name], MD  Probiotic Product (PROBIOTIC ADVANCED PO) Take 1 capsule by mouth daily.   Yes [provider]  Semaglutide, 2 MG/DOSE, 8 MG/3ML SOPN Inject 2 mg as directed once a week. 03/12/21  Yes [name], MD  traZODone (DESYREL) 150 MG tablet TAKE 1 TABLET BY MOUTH AT BEDTIME. 03/10/21  Yes [name], MD  Continuous Blood Gluc Receiver (FREESTYLE LIBRE 2 READER) DEVI Use to check glucose at least TID 06/16/20   [name], MD  Continuous Blood Gluc Sensor (FREESTYLE LIBRE 2 SENSOR) MISC Use to check glucose at least TID 06/16/20   [name], MD    Family History Family History  Problem Relation Age of Onset   Coarctation of the aorta Sister    Diabetes Mother    COPD Mother    Obesity Mother    Depression Mother    Pancreatic cancer Mother    Alcohol abuse Father    Cirrhosis Father    Down syndrome Brother    Pneumonia Brother    Down syndrome Daughter    Lupus Daughter    Ulcerative colitis Daughter    Diabetes Maternal Aunt    Breast cancer Maternal Aunt    Breast cancer Maternal Aunt     Social History Social History   Tobacco Use   Smoking status: Never   Smokeless tobacco: Never  Vaping Use   Vaping Use: Never  used  Substance Use Topics   Alcohol use: Yes    Comment: occasional wine    Drug use: No     Allergies   Oxycodone hcl, Albuterol, and Oxycodone hcl   Review of Systems Review of Systems  As stated above in HPI Physical Exam Triage Vital Signs ED Triage Vitals  Enc Vitals Group     BP 05/14/21 1859 126/62     Pulse Rate 05/14/21 1859 69     Resp 05/14/21 1859  14     Temp 05/14/21 1859 98.1 F (36.7 C)     Temp Source 05/14/21 1859 Oral     SpO2 05/14/21 1859 96 %     Weight 05/14/21 1855 160 lb (72.6 kg)     Height 05/14/21 1855 5\' 1"  (1.549 m)     Head Circumference --      Peak Flow --      Pain Score 05/14/21 1855 7     Pain Loc --      Pain Edu? --      Excl. in [place]? --    No data found.  Updated Vital Signs BP 126/62 (BP Location: Right Arm)   Pulse 69   Temp 98.1 F (36.7 C) (Oral)   Resp 14   Ht 5\' 1"  (1.549 m)   Wt 160 lb (72.6 kg)   SpO2 96%   BMI 30.23 kg/m   Physical Exam Vitals and nursing note reviewed.  Constitutional:      General: She is not in acute distress.    Appearance: Normal appearance. She is not ill-appearing, toxic-appearing or diaphoretic.  Cardiovascular:     Pulses: Normal pulses.  Musculoskeletal:        General: Swelling and tenderness present.       Arms:  Skin:    Findings: Bruising (left hand) present. No erythema.  Neurological:     Mental Status: She is alert.     UC Treatments / Results  Labs (all labs ordered are listed, but only abnormal results are displayed) Labs Reviewed - No data to display  EKG   Radiology DG Hand Complete Left  Result Date: 05/14/2021 CLINICAL DATA:  Left hand pain and swelling. EXAM: LEFT HAND - COMPLETE 3+ VIEW COMPARISON:  None. FINDINGS: There is no acute fracture or dislocation. The bones are osteopenic. No significant arthritic changes. Mild soft tissue swelling of the dorsum of the hand. No radiopaque foreign object or soft tissue gas. IMPRESSION: No acute osseous  pathology. Electronically Signed   By: Anner Crete M.D.   On: 05/14/2021 19:41    Procedures Procedures (including critical care time)  Medications Ordered in UC Medications - No data to display  Initial Impression / Assessment and Plan / UC Course  I have reviewed the triage vital signs and the nursing notes.  Pertinent labs & imaging results that were available during my care of the patient were reviewed by me and considered in my medical decision making (see chart for details).     New.  X-ray is read as negative but when reviewing on my own with colleague we both agree that there is something that could very well be a vessel but could also be a small fracture of her left wrist.  There is also a slight concern about a potential fracture of her fourth finger in the area discomfort.  I am going to place her in a wrist splint and have her RICE the area.  Tylenol for pain.  She will follow-up with orthopedics.  Discussed red flag signs symptoms. Final Clinical Impressions(s) / UC Diagnoses   Final diagnoses:  Left wrist pain  Left hand pain   Discharge Instructions   None    ED Prescriptions   None    PDMP not reviewed this encounter.   Hughie Closs, Hershal Coria 05/14/21 2000

## 2021-05-18 DIAGNOSIS — E1122 Type 2 diabetes mellitus with diabetic chronic kidney disease: Secondary | ICD-10-CM | POA: Diagnosis not present

## 2021-05-18 DIAGNOSIS — R809 Proteinuria, unspecified: Secondary | ICD-10-CM | POA: Diagnosis not present

## 2021-05-18 DIAGNOSIS — I129 Hypertensive chronic kidney disease with stage 1 through stage 4 chronic kidney disease, or unspecified chronic kidney disease: Secondary | ICD-10-CM | POA: Diagnosis not present

## 2021-05-18 DIAGNOSIS — S60212A Contusion of left wrist, initial encounter: Secondary | ICD-10-CM | POA: Diagnosis not present

## 2021-05-18 DIAGNOSIS — N1832 Chronic kidney disease, stage 3b: Secondary | ICD-10-CM | POA: Diagnosis not present

## 2021-05-18 DIAGNOSIS — S63502A Unspecified sprain of left wrist, initial encounter: Secondary | ICD-10-CM | POA: Diagnosis not present

## 2021-05-20 ENCOUNTER — Telehealth: Payer: Self-pay | Admitting: *Deleted

## 2021-05-20 NOTE — Chronic Care Management (AMB) (Signed)
  Care Management   Note  05/20/2021 Name: Haley Young MRN: [id number] DOB: 27-Apr-1951  Haley Young is a 70 y.o. year old female who is a primary care patient of Derrel Nip, Aris Everts, MD and is actively engaged with the care management team. I reached out to [name] by phone today to assist with scheduling an initial visit with the RN Case Manager  Follow up plan: Telephone appointment with care management team member scheduled for:05/28/21  [facility]  Direct Dial: [phone]

## 2021-05-21 ENCOUNTER — Other Ambulatory Visit (HOSPITAL_COMMUNITY): Payer: Self-pay

## 2021-05-21 ENCOUNTER — Ambulatory Visit (INDEPENDENT_AMBULATORY_CARE_PROVIDER_SITE_OTHER): Payer: HMO | Admitting: Pharmacist

## 2021-05-21 DIAGNOSIS — N183 Chronic kidney disease, stage 3 unspecified: Secondary | ICD-10-CM

## 2021-05-21 DIAGNOSIS — E039 Hypothyroidism, unspecified: Secondary | ICD-10-CM

## 2021-05-21 DIAGNOSIS — E1169 Type 2 diabetes mellitus with other specified complication: Secondary | ICD-10-CM

## 2021-05-21 DIAGNOSIS — E271 Primary adrenocortical insufficiency: Secondary | ICD-10-CM

## 2021-05-21 DIAGNOSIS — E1165 Type 2 diabetes mellitus with hyperglycemia: Secondary | ICD-10-CM

## 2021-05-21 MED ORDER — SEMAGLUTIDE (2 MG/DOSE) 8 MG/3ML ~~LOC~~ SOPN
2.0000 mg | PEN_INJECTOR | SUBCUTANEOUS | 2 refills | Status: DC
  Filled 2021-05-21: qty 3, 28d supply, fill #0
  Filled 2021-06-28: qty 3, 28d supply, fill #1
  Filled 2021-07-27: qty 3, 28d supply, fill #2

## 2021-05-21 MED ORDER — TRESIBA FLEXTOUCH 100 UNIT/ML ~~LOC~~ SOPN
10.0000 [IU] | PEN_INJECTOR | Freq: Every day | SUBCUTANEOUS | 1 refills | Status: DC
  Filled 2021-05-21: qty 9, 90d supply, fill #0

## 2021-05-21 MED ORDER — FIASP FLEXTOUCH 100 UNIT/ML ~~LOC~~ SOPN
PEN_INJECTOR | SUBCUTANEOUS | 2 refills | Status: DC
  Filled 2021-05-21: qty 15, 28d supply, fill #0
  Filled 2021-06-28: qty 15, 28d supply, fill #1

## 2021-05-21 NOTE — Patient Instructions (Signed)
Haley Young,  I will talk to Dr. Derrel Nip about the nutritionist referral.   Please start telmisartan once daily. Check your blood pressure at home. This medication is beneficial for your heart and your kidneys.   Please incorporate protein with EVERY meal. This could be a low carb protein shake (Glucerna, Premier Protein), greek yogurt, egg, cheese, chicken, etc. This will help stabilize your blood sugars so they don't spike and drop as much.   Stop Novolog and start Fiasp, 16 units before breakfast, 16 units before lunch, and 20 units before supper.   Decrease Tresiba to 10 units daily. Finish your supply of Tresiba U200 and change to [place] U100.   Continue Ozempic 2 mg weekly. I have sent that refill to [facility] and they can mail it to you. Continue Jardiance 25 mg daily.   Take care!  Catie Darnelle Maffucci, PharmD  Patient Goals/Self-Care Activities Over the next 90 days, patient will:  - take medications as prescribed check blood glucose at least three times daily using CGM, document, and provide at future appointments   The patient verbalized understanding of instructions, educational materials, and care plan provided today and agreed to receive a mailed copy of patient instructions, educational materials, and care plan.   Plan: Telephone follow up appointment with care management team member scheduled for:  6 weeks   Catie Darnelle Maffucci, PharmD, [place], [place] Clinical Pharmacist [employer] at [employer] [phone]

## 2021-05-21 NOTE — Chronic Care Management (AMB) (Addendum)
Chronic Care Management CCM Pharmacy Note  05/21/2021 Name:  Haley Young MRN:  [id number] DOB:  12-08-1950  Summary: - Continued glycemic post prandial elevations with occasional post prandial hypoglycemia.  - Never scheduled lab work per Dr. Derrel Nip. Scheduled - Telmisartan added by nephrology  Recommendations/Changes made from today's visit: - Ozempic sent to [facility] mail order per patient request - [place] U200 changed to [place] U100 - decreased to 10 units daily - Novolog changed to [place] due to quicker onset of action - continue 16 units with breakfast and lunch and 20 units with supper - BMP ordered s/p telmisartan addition - Patient requests nutritionist referral. Will discuss with PCP  Subjective: Haley Young is an 70 y.o. year old female who is a primary patient of Derrel Nip, Aris Everts, MD.  The CCM team was consulted for assistance with disease management and care coordination needs.    Engaged with patient by telephone for follow up visit for pharmacy case management and/or care coordination services.   Objective:  Medications Reviewed Today     Reviewed by De Hollingshead, RPH-CPP (Pharmacist) on 05/21/21 at [phone]  Med List Status: <None>   Medication Order Taking? Sig Documenting Provider Last Dose Status Informant  apixaban (ELIQUIS) 5 MG TABS tablet [id number] Yes Take 1 tablet (5 mg total) by mouth 2 (two) times daily. [name], MD Taking Active   atorvastatin (LIPITOR) 80 MG tablet [id number] Yes TAKE 1 TABLET BY MOUTH EVERY DAY [name], MD Taking Active   Continuous Blood Gluc Receiver (FREESTYLE LIBRE 2 READER) DEVI [id number] Yes Use to check glucose at least TID [name], MD Taking Active   Continuous Blood Gluc Sensor (FREESTYLE LIBRE 2 SENSOR) [place] [id number] Yes Use to check glucose at least TID [name], MD Taking Active   empagliflozin (JARDIANCE) 25 MG TABS tablet [id number] Yes Take 1 tablet (25 mg total)  by mouth daily before breakfast. [name], MD Taking Active            Med Note De Hollingshead   Tue Dec 01, 2020  8:36 AM)    ergocalciferol (VITAMIN D2) 1.25 MG (50000 UT) capsule [id number] Yes Take 1 capsule (50,000 Units total) by mouth once a week. [name], MD Taking Active   Fludrocortisone Acetate (FLORINEF PO) [id number] Yes Take 0.1 mg by mouth daily. [provider] Taking Active   furosemide (LASIX) 20 MG tablet [id number] Yes Take 40 mg by mouth daily. [provider] Taking Active Self  hydrocortisone (CORTEF) 10 MG tablet [id number] Yes Take 5 mg by mouth 2 (two) times daily. 10 mg QAM, 5 PM [provider] Taking Active Self  insulin aspart (NOVOLOG FLEXPEN) 100 UNIT/ML FlexPen [id number] Yes Inject 16 units before  breakfast and lunchtime meals, and  20 units before supper [name], MD Taking Active   insulin degludec (TRESIBA FLEXTOUCH) 200 UNIT/ML FlexTouch Pen [id number] Yes Inject 10 Units into the skin daily. [name], MD Taking Active            Med Note Darnelle Maffucci, Arville Lime   Wed Apr 07, 2021  9:05 AM) 12 units  levothyroxine (SYNTHROID) 100 MCG tablet [id number] Yes Take 100 mcg by mouth daily. [provider] Taking Active   metoprolol succinate (TOPROL-XL) 25 MG 24 hr tablet [id number] Yes Take 25 mg by mouth daily. [provider] Taking Active   mirtazapine (REMERON) 15 MG tablet [id number]  Yes Take 1 tablet (15 mg total) by mouth at bedtime. [name], MD Taking Active   Probiotic Product (PROBIOTIC ADVANCED PO) [id number] Yes Take 1 capsule by mouth daily. [provider] Taking Active   Semaglutide, 2 MG/DOSE, 8 MG/3ML SOPN [id number] Yes Inject 2 mg as directed once a week. [name], MD Taking Active   telmisartan (MICARDIS) 20 MG tablet [id number] Yes Take 20 mg by mouth daily. [provider] Taking Active   traZODone (DESYREL) 150 MG tablet [id number] Yes TAKE 1  TABLET BY MOUTH AT BEDTIME. [name], MD Taking Active   vitamin B-12 (CYANOCOBALAMIN) 500 MCG tablet [id number] Yes Take 500 mcg by mouth daily. [provider] Taking Active             Pertinent Labs:   Lab Results  Component Value Date   HGBA1C 8.3 (H) 05/03/2021   Lab Results  Component Value Date   CHOL 103 05/03/2021   HDL 43.40 05/03/2021   LDLCALC 39 05/03/2021   LDLDIRECT 165.0 04/14/2016   TRIG 106.0 05/03/2021   CHOLHDL 2 05/03/2021   Lab Results  Component Value Date   CREATININE 0.97 05/03/2021   BUN 12 05/03/2021   NA 144 05/03/2021   K 3.8 05/03/2021   CL 103 05/03/2021   CO2 33 (H) 05/03/2021    SDOH:  (Social Determinants of Health) assessments and interventions performed:  SDOH Interventions    Flowsheet Row Most Recent Value  SDOH Interventions   Financial Strain Interventions Intervention Not Indicated       CCM Care Plan  Review of patient past medical history, allergies, medications, health status, including review of consultants reports, laboratory and other test data, was performed as part of comprehensive evaluation and provision of chronic care management services.   Care Plan : Medication Management  Updates made by De Hollingshead, RPH-CPP since 05/21/2021 12:00 AM     Problem: Diabetes, Addison's Disease, Atrial Fibrillation, Osteoporosis      Long-Range Goal: Disease Progression Prevention   Recent Progress: On track  Priority: High  Note:   Current Barriers:  Unable to independently monitor therapeutic efficacy Unable to achieve control of diabetes  Complex patient with multiple comorbidities, including diabetes, Addison's disease, atrial fibrillation, hx pulmonic valve replacement, osteoporosis  Pharmacist Clinical Goal(s):  Over the next 90 days, patient will verbalize ability to afford treatment regimen. Over the next 90 days, patient will achieve adherence to monitoring guidelines and medication  adherence to achieve therapeutic efficacy. Over the next 90 days, patient will achieve control of diabetes as evidenced by  improvement in A1c through collaboration with PharmD and provider.   Interventions: 1:1 collaboration with [name], MD regarding development and update of comprehensive plan of care as evidenced by provider attestation and co-signature Inter-disciplinary care team collaboration (see longitudinal plan of care) Comprehensive medication review performed; medication list updated in electronic medical record  SDOH Over income for patient assistance for [employer] ([place], [place], [place]), [employer] ([place]) or [employer] ([place]). Denies cost concerns with medications at this time  Health Maintenance Yearly diabetic eye exam: up to date Yearly diabetic foot exam: up to date Yearly influenza vaccination: due Td/Tdap vaccination: due Pneumonia vaccination: up to date COVID vaccinations: due for 4th booster, consider moving forward.   Shingrix vaccinations: due - will discuss moving forward Colonoscopy: up to date Bone density scan: up to date Mammogram: up to date  Diabetes: Uncontrolled, current treatment: Ozempic 2  mg weekly, Tresiba 12 QPM, Jardiance 25 mg daily, Novolog 16 units with breakfast/lunch and 20 units with lunch or supper  Reports she has "done better" at remembering to take Novolog prior to meals, only forgetting ~ once weekly Hx metformin, but discontinued by endocrinology previously d/t elevated lactic acid and risk of lactic acidosis Current meal patterns: breakfast: 1 package or either oatmeal or grits, coffee; lunch: tomato sandwich, banana sandwich, or chicken salad with crackers; supper: chicken tenders, lima beans, broccoli casserole; drink: flavored water or water, coffee, sierra mist, unsweet tea Current glucose readings: using Libre 2 CGM Date of Download: 10/29-11/11/22 % Time CGM is active: 76% Average  Glucose: 210 mg/dL Glucose Management Indicator: 8.3%  Glucose Variability: 39.8 (goal <36%) Time in Goal:  - Time in range 70-180: 41% - Time above range: 57% - Time below range: 2% Observed patterns: variable patterns Again reviewed importance of incorporating protein into meals, minimizing carbohydrates. Patient amenable to nutrition referral. Will discuss w/ PCP Given continued elevations post prandial and then hypoglycemia, faster acting bolus insulin may provide better glycemic control. D/c Novolog, start Fiasp 16 units with breakfast and lunch and 20 units with supper.  Decrease Tresiba to 10 units daily due to occasional hypoglycemia. Changing to U100 given low doses.   Atrial Fibrillation, HTN, hx pulmonic valve replacement Appropriately managed, last clinic BP at goal; current treatment: metoprolol succinate 25 mg daily; telmisartan 20 mg daily added by nephrology, though no f/u BMP placed furosemide 40 mg daily  Anticoagulant regimen: Eliquis 5 mg BID Scheduled BMP with upcoming lab work to evaluate impact of telmisartan addition. Previously recommended to continue current regimen at this time  Hyperlipidemia: Controlled per last lipid panel; current treatment: atorvastatin 80 mg daily  Previously recommended to continue current regimen at this time.  Addison's Disease Appropriately managed; current treatment; hydrocortisone 10 mg QAM, 5 mg QPM; fludrocortisone 0.1 mg daily Previously recommended to continue current regimen at this time. Referral in place for endocrninology.  Depression/anxiety/insomnia: Appropriately managed per patient symptom report; current treatment: trazodone 150 mg QPM, mirtazapine 15 mg daily  Reports she is sleeping better with mirtazapine dose increase  Hypothyroidism: Controlled per last lab result; levothyroxine 100 mcg daily Previously recommended to continue current regimen at this time  Osteoporosis: Untreated; Will discuss treatment,  including appropriate calcium + vitamin D and falls risk reduction moving forward.  Last DEXA 2018 w/ osteopenia, FRAX scores did not meet criteria for treatment. Continue to monitor as appropriate.   Supplementation: Vitamin B12  Patient Goals/Self-Care Activities Over the next 90 days, patient will:  - take medications as prescribed check blood glucose at least three times daily using CGM, document, and provide at future appointments      Plan: Telephone follow up appointment with care management team member scheduled for:  6 weeks  Catie Darnelle Maffucci, PharmD, [place], CPP Clinical Pharmacist [place] at [facility] [phone]    I have reviewed the above information and agree with above.   Deborra Medina, MD

## 2021-05-24 ENCOUNTER — Other Ambulatory Visit (HOSPITAL_COMMUNITY): Payer: Self-pay

## 2021-05-28 ENCOUNTER — Ambulatory Visit: Payer: HMO | Admitting: *Deleted

## 2021-05-28 DIAGNOSIS — Z794 Long term (current) use of insulin: Secondary | ICD-10-CM

## 2021-05-28 DIAGNOSIS — I5022 Chronic systolic (congestive) heart failure: Secondary | ICD-10-CM

## 2021-05-28 DIAGNOSIS — I272 Pulmonary hypertension, unspecified: Secondary | ICD-10-CM

## 2021-05-28 DIAGNOSIS — E1165 Type 2 diabetes mellitus with hyperglycemia: Secondary | ICD-10-CM

## 2021-05-30 NOTE — Chronic Care Management (AMB) (Signed)
Chronic Care Management   CCM RN Visit Note  05/30/2021 Name: Haley Young MRN: [id number] DOB: 1950-12-23  Subjective: Haley Young is a 70 y.o. year old female who is a primary care patient of Derrel Nip, Aris Everts, MD. The care management team was consulted for assistance with disease management and care coordination needs.    Engaged with patient by telephone for initial visit in response to provider referral for case management and/or care coordination services.   Consent to Services:  The patient was given the following information about Chronic Care Management services today, agreed to services, and gave verbal consent: 1. CCM service includes personalized support from designated clinical staff supervised by the primary care provider, including individualized plan of care and coordination with other care providers 2. 24/7 contact phone numbers for assistance for urgent and routine care needs. 3. Service will only be billed when office clinical staff spend 20 minutes or more in a month to coordinate care. 4. Only one practitioner may furnish and bill the service in a calendar month. 5.The patient may stop CCM services at any time (effective at the end of the month) by phone call to the office staff. 6. The patient will be responsible for cost sharing (co-pay) of up to 20% of the service fee (after annual deductible is met). Patient agreed to services and consent obtained.  Patient agreed to services and verbal consent obtained.   Assessment: Review of patient past medical history, allergies, medications, health status, including review of consultants reports, laboratory and other test data, was performed as part of comprehensive evaluation and provision of chronic care management services.   SDOH (Social Determinants of Health) assessments and interventions performed:  SDOH Interventions    Flowsheet Row Most Recent Value  SDOH Interventions   Food Insecurity Interventions Intervention Not  Indicated  Financial Strain Interventions Intervention Not Indicated  Housing Interventions Intervention Not Indicated  Intimate Partner Violence Interventions Intervention Not Indicated  Transportation Interventions Intervention Not Indicated        CCM Care Plan  Allergies  Allergen Reactions   Oxycodone Hcl     REACTION: hallucinations   Albuterol Anxiety    Other reaction(s): Other (See Comments) Tremors    Oxycodone Hcl Other (See Comments)    halucinations    Outpatient Encounter Medications as of 05/28/2021  Medication Sig   apixaban (ELIQUIS) 5 MG TABS tablet Take 1 tablet (5 mg total) by mouth 2 (two) times daily.   atorvastatin (LIPITOR) 80 MG tablet TAKE 1 TABLET BY MOUTH EVERY DAY   empagliflozin (JARDIANCE) 25 MG TABS tablet Take 1 tablet (25 mg total) by mouth daily before breakfast.   ergocalciferol (VITAMIN D2) 1.25 MG (50000 UT) capsule Take 1 capsule (50,000 Units total) by mouth once a week.   Fludrocortisone Acetate (FLORINEF PO) Take 0.1 mg by mouth daily.   furosemide (LASIX) 20 MG tablet Take 40 mg by mouth daily.   hydrocortisone (CORTEF) 10 MG tablet Take 5 mg by mouth 2 (two) times daily. 10 mg QAM, 5 PM   insulin aspart (FIASP FLEXTOUCH) 100 UNIT/ML FlexTouch Pen Inject 16 units with breakfast and lunch and 20 units with supper   insulin degludec (TRESIBA FLEXTOUCH) 100 UNIT/ML FlexTouch Pen Inject 10 Units into the skin daily.   levothyroxine (SYNTHROID) 100 MCG tablet Take 100 mcg by mouth daily.   metoprolol succinate (TOPROL-XL) 25 MG 24 hr tablet Take 25 mg by mouth daily.   mirtazapine (REMERON) 15 MG tablet Take 1  tablet (15 mg total) by mouth at bedtime.   Probiotic Product (PROBIOTIC ADVANCED PO) Take 1 capsule by mouth daily.   Semaglutide, 2 MG/DOSE, 8 MG/3ML SOPN Inject 2 mg as directed once a week.   telmisartan (MICARDIS) 20 MG tablet Take 20 mg by mouth daily.   traZODone (DESYREL) 150 MG tablet TAKE 1 TABLET BY MOUTH AT BEDTIME.    vitamin B-12 (CYANOCOBALAMIN) 500 MCG tablet Take 500 mcg by mouth daily.   Continuous Blood Gluc Receiver (FREESTYLE LIBRE 2 READER) DEVI Use to check glucose at least TID   Continuous Blood Gluc Sensor (FREESTYLE LIBRE 2 SENSOR) MISC Use to check glucose at least TID   No facility-administered encounter medications on file as of 05/28/2021.    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   Right hip pain 03/12/2021   CKD (chronic kidney disease) stage 3, GFR 30-59 ml/min (HCC) 01/16/2021   Chronic left SI joint pain 05/01/2019   Transaminitis 05/01/2019   Educated about COVID-19 virus infection 11/15/2018   Anemia 07/06/2018   Chronic atrial fibrillation ([place]) 05/25/2018   Neuropathic pain of hand 05/03/2018   History of cardiac arrest 05/03/2018   Cerebral infarction, watershed distribution, bilateral, remote, resolved 85/46/2703   Chronic systolic heart failure ([place]) 04/26/2018   Dysphagia, oropharyngeal 04/26/2018   Chronic constipation 04/26/2018   Adrenal insufficiency (Addison's disease) ([place]) 01/20/2018   Hospital discharge follow-up 01/20/2018   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   CHB (complete heart block) ([place]) 08/17/2017   CKD stage 3 due to type 2 diabetes mellitus ([place]) 05/18/2017   Hyponatremia 11/01/2016   Drug-induced vitamin B12 deficiency anemia 04/16/2016   Fatigue 03/27/2015   Overweight (BMI 25.0-29.9) 10/07/2014   Encounter for preventive health examination 09/13/2013   Hyperlipidemia associated with type 2 diabetes mellitus ([place]) 07/25/2013   Insomnia 07/25/2013   Hypothyroidism 03/29/2012   Screening for breast cancer 03/29/2012   Screening for colon cancer 03/29/2012   Depression, recurrent ([place]) 08/13/2008   GERD 08/13/2008   Osteopenia 08/13/2008   COLONIC POLYPS, HX OF 08/13/2008    Conditions to be  addressed/monitored:CHF, HTN, and DMII  Care Plan : [place] (Adult)  Updates made by Leona Singleton, RN since 05/30/2021 12:00 AM     Problem: Knowledge deficit related to self care managemenr of chronic  condition   Priority: Medium     Long-Range Goal: Patient will work with CCM team to gain knowledge for better self care monitoring of chronic conditions   Start Date: 05/28/2021  Expected End Date: 05/28/2022  Priority: Medium  Note:   Current Barriers:  Knowledge Deficits related to plan of care for management of CHF, HTN, and DMII  Chronic Disease Management support and education needs related to CHF, HTN, and DMII  Patient reporting doing far.  Fasting blood sugar this morning was 133.     RNCM Clinical Goal(s):  Patient will verbalize understanding of plan for management of CHF, HTN, and DMII as evidenced by beginning to monitor blood pressure and daily weight monitor verbalize basic understanding of CHF, HTN, and DMII disease process and self health management plan as evidenced by monitoring vitals demonstrate understanding of rationale for each prescribed medication as evidenced by medication compliance    demonstrate improved health management independence as evidenced by decreasing Hgb A1C by 0.3 points in the next 90  days        through collaboration with Consulting civil engineer, provider, and care team.   Interventions: 1:1 collaboration with primary care provider regarding development and update of comprehensive plan of care as evidenced by provider attestation and co-signature Inter-disciplinary care team collaboration (see longitudinal plan of care) Evaluation of current treatment plan related to  self management and patient's adherence to plan as established by provider   Heart Failure Interventions:  (Status: New goal.)  Long Term Goal  Basic overview and discussion of pathophysiology of Heart Failure reviewed Reviewed Heart Failure Action Plan in depth  and provided written copy Advised patient to weigh each morning after emptying bladder Discussed importance of daily weight and advised patient to weigh and record daily Reviewed role of diuretics in prevention of fluid overload and management of heart failure Discussed the importance of keeping all appointments with provider  Diabetes:  (Status: New goal.) Long Term Goal   Lab Results  Component Value Date   HGBA1C 8.3 (H) 05/03/2021  Assessed patient's understanding of A1c goal: <7% Provided education to patient about basic DM disease process; Reviewed medications with patient and discussed importance of medication adherence;        Reviewed prescribed diet with patient low salt carb modified; Counseled on importance of regular laboratory monitoring as prescribed;        Discussed plans with patient for ongoing care management follow up and provided patient with direct contact information for care management team;      Provided patient with written educational materials related to hypo and hyperglycemia and importance of correct treatment;       Advised patient, providing education and rationale, to check cbg three times daily and record        Screening for signs and symptoms of depression related to chronic disease state;         Hypertension: (Status: New goal.)  Long Term Goal Last practice recorded BP readings:  BP Readings from Last 3 Encounters:  05/14/21 126/62  05/03/21 118/76  03/12/21 114/76  Most recent eGFR/CrCl: No results found for: EGFR  No components found for: CRCL  Evaluation of current treatment plan related to hypertension self management and patient's adherence to plan as established by provider;   Reviewed medications with patient and discussed importance of compliance;  Discussed plans with patient for ongoing care management follow up and provided patient with direct contact information for care management team; Advised patient, providing education and  rationale, to monitor blood pressure daily and record, calling PCP for findings outside established parameters;  Discussed complications of poorly controlled blood pressure such as heart disease, stroke, circulatory complications, vision complications, kidney impairment, sexual dysfunction;   Patient Goals/Self-Care Activities: Take medications as prescribed   Attend all scheduled provider appointments Perform all self care activities independently  Call provider office for new concerns or questions  call office if I gain more than 2 pounds in one day or 5 pounds in one week keep legs up while sitting weigh myself daily bring diary to all appointments develop a rescue plan follow rescue plan if symptoms flare-up know when to call the doctorbased on weight and action plan track symptoms and what helps feel better or worse check blood sugar at prescribed times: three times daily and when you have symptoms of low or high blood sugar enter blood sugar readings and medication or insulin into daily log take the blood sugar log to all doctor visits take the blood sugar meter  to all doctor visits limit fast food meals to no more than 1 per week keep feet up while sitting check blood pressure 3 times per week keep a blood pressure log take blood pressure log to all doctor appointments call doctor for signs and symptoms of high blood pressure develop an action plan for high blood pressure keep all doctor appointments take medications for blood pressure exactly as prescribed       Plan:The care management team will reach out to the patient again over the next 45 days.  Hubert Azure RN, MSN RN Care Management Coordinator [place] [phone] [email or web address]

## 2021-05-30 NOTE — Patient Instructions (Signed)
Visit Information   Thank you for taking time to visit with me today. Please don't hesitate to contact me if I can be of assistance to you before our next scheduled telephone appointment.  Following are the goals we discussed today:  Take medications as prescribed   Attend all scheduled provider appointments Perform all self care activities independently  Call provider office for new concerns or questions  call office if I gain more than 2 pounds in one day or 5 pounds in one week keep legs up while sitting weigh myself daily bring diary to all appointments develop a rescue plan follow rescue plan if symptoms flare-up know when to call the doctorbased on weight and action plan track symptoms and what helps feel better or worse check blood sugar at prescribed times: three times daily and when you have symptoms of low or high blood sugar enter blood sugar readings and medication or insulin into daily log take the blood sugar log to all doctor visits take the blood sugar meter to all doctor visits limit fast food meals to no more than 1 per week keep feet up while sitting check blood pressure 3 times per week keep a blood pressure log take blood pressure log to all doctor appointments call doctor for signs and symptoms of high blood pressure develop an action plan for high blood pressure keep all doctor appointments take medications for blood pressure exactly as prescribed  Our next appointment is by telephone on 07/02/21 at 12 pm  Please call the care guide team at [phone] if you need to cancel or reschedule your appointment.   Please call 911 call the Suicide and Crisis Lifeline: 988 call the [place] National Suicide Prevention Lifeline: 647-311-0369 call 1-800-273-TALK (toll free, 24 hour hotline) if you are experiencing a Mental Health or [place] or need someone to talk to.  Following is a copy of your full care plan:  Care Plan : [place] [facility]  Care (Adult)  Updates made by Leona Singleton, RN since 05/30/2021 12:00 AM     Problem: Knowledge deficit related to self care managemenr of chronic  condition   Priority: Medium     Long-Range Goal: Patient will work with CCM team to gain knowledge for better self care monitoring of chronic conditions   Start Date: 05/28/2021  Expected End Date: 05/28/2022  Priority: Medium  Note:   Current Barriers:  Knowledge Deficits related to plan of care for management of CHF, HTN, and DMII  Chronic Disease Management support and education needs related to CHF, HTN, and DMII  Patient reporting doing far.  Fasting blood sugar this morning was 133.     RNCM Clinical Goal(s):  Patient will verbalize understanding of plan for management of CHF, HTN, and DMII as evidenced by beginning to monitor blood pressure and daily weight monitor verbalize basic understanding of CHF, HTN, and DMII disease process and self health management plan as evidenced by monitoring vitals demonstrate understanding of rationale for each prescribed medication as evidenced by medication compliance    demonstrate improved health management independence as evidenced by decreasing Hgb A1C by 0.3 points in the next 90 days        through collaboration with RN Care manager, provider, and care team.   Interventions: 1:1 collaboration with primary care provider regarding development and update of comprehensive plan of care as evidenced by provider attestation and co-signature Inter-disciplinary care team collaboration (see longitudinal plan of care) Evaluation of current  treatment plan related to  self management and patient's adherence to plan as established by provider   Heart Failure Interventions:  (Status: New goal.)  Long Term Goal  Basic overview and discussion of pathophysiology of Heart Failure reviewed Reviewed Heart Failure Action Plan in depth and provided written copy Advised patient to weigh each morning after  emptying bladder Discussed importance of daily weight and advised patient to weigh and record daily Reviewed role of diuretics in prevention of fluid overload and management of heart failure Discussed the importance of keeping all appointments with provider  Diabetes:  (Status: New goal.) Long Term Goal   Lab Results  Component Value Date   HGBA1C 8.3 (H) 05/03/2021  Assessed patient's understanding of A1c goal: <7% Provided education to patient about basic DM disease process; Reviewed medications with patient and discussed importance of medication adherence;        Reviewed prescribed diet with patient low salt carb modified; Counseled on importance of regular laboratory monitoring as prescribed;        Discussed plans with patient for ongoing care management follow up and provided patient with direct contact information for care management team;      Provided patient with written educational materials related to hypo and hyperglycemia and importance of correct treatment;       Advised patient, providing education and rationale, to check cbg three times daily and record        Screening for signs and symptoms of depression related to chronic disease state;         Hypertension: (Status: New goal.)  Long Term Goal Last practice recorded BP readings:  BP Readings from Last 3 Encounters:  05/14/21 126/62  05/03/21 118/76  03/12/21 114/76  Most recent eGFR/CrCl: No results found for: EGFR  No components found for: CRCL  Evaluation of current treatment plan related to hypertension self management and patient's adherence to plan as established by provider;   Reviewed medications with patient and discussed importance of compliance;  Discussed plans with patient for ongoing care management follow up and provided patient with direct contact information for care management team; Advised patient, providing education and rationale, to monitor blood pressure daily and record, calling PCP for  findings outside established parameters;  Discussed complications of poorly controlled blood pressure such as heart disease, stroke, circulatory complications, vision complications, kidney impairment, sexual dysfunction;   Patient Goals/Self-Care Activities: Take medications as prescribed   Attend all scheduled provider appointments Perform all self care activities independently  Call provider office for new concerns or questions  call office if I gain more than 2 pounds in one day or 5 pounds in one week keep legs up while sitting weigh myself daily bring diary to all appointments develop a rescue plan follow rescue plan if symptoms flare-up know when to call the doctorbased on weight and action plan track symptoms and what helps feel better or worse check blood sugar at prescribed times: three times daily and when you have symptoms of low or high blood sugar enter blood sugar readings and medication or insulin into daily log take the blood sugar log to all doctor visits take the blood sugar meter to all doctor visits limit fast food meals to no more than 1 per week keep feet up while sitting check blood pressure 3 times per week keep a blood pressure log take blood pressure log to all doctor appointments call doctor for signs and symptoms of high blood pressure develop an action plan for  high blood pressure keep all doctor appointments take medications for blood pressure exactly as prescribed       Consent to CCM Services: Haley Young was given information about Chronic Care Management services including:  CCM service includes personalized support from designated clinical staff supervised by her physician, including individualized plan of care and coordination with other care providers 24/7 contact phone numbers for assistance for urgent and routine care needs. Service will only be billed when office clinical staff spend 20 minutes or more in a month to coordinate care. Only  one practitioner may furnish and bill the service in a calendar month. The patient may stop CCM services at any time (effective at the end of the month) by phone call to the office staff. The patient will be responsible for cost sharing (co-pay) of up to 20% of the service fee (after annual deductible is met).  Patient agreed to services and verbal consent obtained.   Patient verbalizes understanding of instructions provided today and agrees to view in [place].   The care management team will reach out to the patient again over the next 45 days.

## 2021-06-02 ENCOUNTER — Other Ambulatory Visit (INDEPENDENT_AMBULATORY_CARE_PROVIDER_SITE_OTHER): Payer: HMO

## 2021-06-02 ENCOUNTER — Other Ambulatory Visit: Payer: Self-pay

## 2021-06-02 DIAGNOSIS — E310 Autoimmune polyglandular failure: Secondary | ICD-10-CM

## 2021-06-02 DIAGNOSIS — Z794 Long term (current) use of insulin: Secondary | ICD-10-CM

## 2021-06-02 DIAGNOSIS — R748 Abnormal levels of other serum enzymes: Secondary | ICD-10-CM

## 2021-06-02 DIAGNOSIS — E1165 Type 2 diabetes mellitus with hyperglycemia: Secondary | ICD-10-CM

## 2021-06-02 DIAGNOSIS — R0602 Shortness of breath: Secondary | ICD-10-CM | POA: Diagnosis not present

## 2021-06-02 DIAGNOSIS — I371 Nonrheumatic pulmonary valve insufficiency: Secondary | ICD-10-CM | POA: Diagnosis not present

## 2021-06-02 LAB — BASIC METABOLIC PANEL
BUN: 16 mg/dL (ref 6–23)
CO2: 32 mEq/L (ref 19–32)
Calcium: 9.4 mg/dL (ref 8.4–10.5)
Chloride: 99 mEq/L (ref 96–112)
Creatinine, Ser: 1.18 mg/dL (ref 0.40–1.20)
GFR: 46.03 mL/min — ABNORMAL LOW (ref >60.00)
Glucose, Bld: 165 mg/dL — ABNORMAL HIGH (ref 70–99)
Potassium: 3.1 mEq/L — ABNORMAL LOW (ref 3.5–5.1)
Sodium: 140 mEq/L (ref 135–145)

## 2021-06-02 NOTE — Addendum Note (Signed)
Addended by: Leeanne Rio on: 06/02/2021 10:15 AM   Modules accepted: Orders

## 2021-06-07 LAB — ALPHA-1-ANTITRYPSIN: A-1 Antitrypsin: 195 mg/dL — ABNORMAL HIGH (ref 101–187)

## 2021-06-07 LAB — ANA: Anti Nuclear Antibody (ANA): POSITIVE — AB

## 2021-06-07 LAB — ALKALINE PHOSPHATASE, ISOENZYMES
Alkaline Phosphatase: 380 IU/L — ABNORMAL HIGH (ref 44–121)
BONE FRACTION: 29 % (ref 14–68)
INTESTINAL FRAC.: 0 % (ref 0–18)
LIVER FRACTION: 71 % (ref 18–85)

## 2021-06-07 LAB — HEPATITIS B CORE ANTIBODY, TOTAL: Hep B Core Total Ab: NEGATIVE

## 2021-06-07 LAB — ANTI-SMOOTH MUSCLE ANTIBODY, IGG: Smooth Muscle Ab: 108 Units — ABNORMAL HIGH (ref 0–19)

## 2021-06-07 LAB — CERULOPLASMIN: Ceruloplasmin: 33.2 mg/dL (ref 19.0–39.0)

## 2021-06-07 LAB — ANTI-MICROSOMAL ANTIBODY LIVER / KIDNEY: LKM1 Ab: 2.8 Units (ref 0.0–20.0)

## 2021-06-07 LAB — HEPATITIS C ANTIBODY: Hep C Virus Ab: 0.2 s/co ratio (ref 0.0–0.9)

## 2021-06-07 LAB — ANTI-SMITH ANTIBODY: ENA SM Ab Ser-aCnc: <0.2 AI (ref 0.0–0.9)

## 2021-06-09 DIAGNOSIS — E039 Hypothyroidism, unspecified: Secondary | ICD-10-CM

## 2021-06-09 DIAGNOSIS — I130 Hypertensive heart and chronic kidney disease with heart failure and stage 1 through stage 4 chronic kidney disease, or unspecified chronic kidney disease: Secondary | ICD-10-CM

## 2021-06-09 DIAGNOSIS — E1159 Type 2 diabetes mellitus with other circulatory complications: Secondary | ICD-10-CM

## 2021-06-09 DIAGNOSIS — N183 Chronic kidney disease, stage 3 unspecified: Secondary | ICD-10-CM

## 2021-06-09 DIAGNOSIS — E1122 Type 2 diabetes mellitus with diabetic chronic kidney disease: Secondary | ICD-10-CM

## 2021-06-09 DIAGNOSIS — Z794 Long term (current) use of insulin: Secondary | ICD-10-CM

## 2021-06-09 DIAGNOSIS — I5022 Chronic systolic (congestive) heart failure: Secondary | ICD-10-CM | POA: Diagnosis not present

## 2021-06-09 DIAGNOSIS — E785 Hyperlipidemia, unspecified: Secondary | ICD-10-CM

## 2021-06-10 ENCOUNTER — Telehealth: Payer: HMO

## 2021-06-11 ENCOUNTER — Telehealth: Payer: Self-pay | Admitting: Internal Medicine

## 2021-06-11 DIAGNOSIS — R748 Abnormal levels of other serum enzymes: Secondary | ICD-10-CM

## 2021-06-11 DIAGNOSIS — R768 Other specified abnormal immunological findings in serum: Secondary | ICD-10-CM

## 2021-06-11 NOTE — Telephone Encounter (Signed)
Patient returned office for call for lab results. 

## 2021-06-12 ENCOUNTER — Other Ambulatory Visit: Payer: Self-pay | Admitting: Internal Medicine

## 2021-06-15 NOTE — Telephone Encounter (Signed)
Patient would like referral to [facility] liver specialist and patient has potassium on hand will take for one week.

## 2021-06-15 NOTE — Telephone Encounter (Signed)
Referrral to [facility] Liver clinic is  in progress

## 2021-06-15 NOTE — Telephone Encounter (Signed)
Haley Young,   Your potassium is  low ,  So you will need to take a potassium supplement 20 meQ daily for one week.  Do you have any on hand?    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 A referral to liver specialist is recommended .  Do you have a preference which GI department I refer you to?

## 2021-06-15 NOTE — Telephone Encounter (Signed)
Pt is aware the referral has been placed.

## 2021-06-15 NOTE — Addendum Note (Signed)
Addended by: [name] on: 06/15/2021 12:32 PM   Modules accepted: Orders

## 2021-06-28 ENCOUNTER — Other Ambulatory Visit (HOSPITAL_COMMUNITY): Payer: Self-pay

## 2021-06-29 ENCOUNTER — Other Ambulatory Visit (HOSPITAL_COMMUNITY): Payer: Self-pay

## 2021-07-02 ENCOUNTER — Ambulatory Visit (INDEPENDENT_AMBULATORY_CARE_PROVIDER_SITE_OTHER): Payer: HMO | Admitting: *Deleted

## 2021-07-02 DIAGNOSIS — Z794 Long term (current) use of insulin: Secondary | ICD-10-CM

## 2021-07-02 DIAGNOSIS — I5022 Chronic systolic (congestive) heart failure: Secondary | ICD-10-CM

## 2021-07-02 NOTE — Chronic Care Management (AMB) (Signed)
Chronic Care Management   CCM RN Visit Note  07/02/2021 Name: Haley Young MRN: [id number] DOB: 08/22/1950  Subjective: Haley Young is a 70 y.o. year old female who is a primary care patient of Derrel Nip, Aris Everts, MD. The care management team was consulted for assistance with disease management and care coordination needs.    Engaged with patient by telephone for follow up visit in response to provider referral for case management and/or care coordination services.   Consent to Services:  The patient was given information about Chronic Care Management services, agreed to services, and gave verbal consent prior to initiation of services.  Please see initial visit note for detailed documentation.   Patient agreed to services and verbal consent obtained.   Assessment: Review of patient past medical history, allergies, medications, health status, including review of consultants reports, laboratory and other test data, was performed as part of comprehensive evaluation and provision of chronic care management services.   SDOH (Social Determinants of Health) assessments and interventions performed:    CCM Care Plan  Allergies  Allergen Reactions   Oxycodone Hcl     REACTION: hallucinations   Albuterol Anxiety    Other reaction(s): Other (See Comments) Tremors    Oxycodone Hcl Other (See Comments)    halucinations    Outpatient Encounter Medications as of 07/02/2021  Medication Sig   apixaban (ELIQUIS) 5 MG TABS tablet Take 1 tablet (5 mg total) by mouth 2 (two) times daily.   atorvastatin (LIPITOR) 80 MG tablet TAKE 1 TABLET BY MOUTH EVERY DAY   Continuous Blood Gluc Receiver (FREESTYLE LIBRE 2 READER) DEVI Use to check glucose at least TID   Continuous Blood Gluc Sensor (FREESTYLE LIBRE 2 SENSOR) MISC Use to check glucose at least TID   empagliflozin (JARDIANCE) 25 MG TABS tablet Take 1 tablet (25 mg total) by mouth daily before breakfast.   ergocalciferol (VITAMIN D2) 1.25 MG (50000  UT) capsule Take 1 capsule (50,000 Units total) by mouth once a week.   Fludrocortisone Acetate (FLORINEF PO) Take 0.1 mg by mouth daily.   furosemide (LASIX) 20 MG tablet Take 40 mg by mouth daily.   hydrocortisone (CORTEF) 10 MG tablet Take 5 mg by mouth 2 (two) times daily. 10 mg QAM, 5 PM   insulin aspart (FIASP FLEXTOUCH) 100 UNIT/ML FlexTouch Pen Inject 16 units with breakfast and lunch and 20 units with supper   insulin degludec (TRESIBA FLEXTOUCH) 100 UNIT/ML FlexTouch Pen Inject 10 Units into the skin daily.   levothyroxine (SYNTHROID) 100 MCG tablet Take 100 mcg by mouth daily.   metoprolol succinate (TOPROL-XL) 25 MG 24 hr tablet Take 25 mg by mouth daily.   mirtazapine (REMERON) 15 MG tablet Take 1 tablet (15 mg total) by mouth at bedtime.   Probiotic Product (PROBIOTIC ADVANCED PO) Take 1 capsule by mouth daily.   Semaglutide, 2 MG/DOSE, 8 MG/3ML SOPN Inject 2 mg as directed once a week.   telmisartan (MICARDIS) 20 MG tablet Take 20 mg by mouth daily.   traZODone (DESYREL) 150 MG tablet TAKE 1 TABLET BY MOUTH AT BEDTIME.   vitamin B-12 (CYANOCOBALAMIN) 500 MCG tablet Take 500 mcg by mouth daily.   No facility-administered encounter medications on file as of 07/02/2021.    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   Right hip pain 03/12/2021   CKD (chronic kidney disease) stage 3,  GFR 30-59 ml/min (HCC) 01/16/2021   Chronic left SI joint pain 05/01/2019   Transaminitis 05/01/2019   Educated about COVID-19 virus infection 11/15/2018   Anemia 07/06/2018   Chronic atrial fibrillation ([place]) 05/25/2018   Neuropathic pain of hand 05/03/2018   History of cardiac arrest 05/03/2018   Cerebral infarction, watershed distribution, bilateral, remote, resolved 68/09/2120   Chronic systolic heart failure ([place]) 04/26/2018   Dysphagia, oropharyngeal 04/26/2018   Chronic  constipation 04/26/2018   Adrenal insufficiency (Addison's disease) ([place]) 01/20/2018   Hospital discharge follow-up 01/20/2018   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   CHB (complete heart block) ([place]) 08/17/2017   CKD stage 3 due to type 2 diabetes mellitus ([place]) 05/18/2017   Hyponatremia 11/01/2016   Drug-induced vitamin B12 deficiency anemia 04/16/2016   Fatigue 03/27/2015   Overweight (BMI 25.0-29.9) 10/07/2014   Encounter for preventive health examination 09/13/2013   Hyperlipidemia associated with type 2 diabetes mellitus ([place]) 07/25/2013   Insomnia 07/25/2013   Hypothyroidism 03/29/2012   Screening for breast cancer 03/29/2012   Screening for colon cancer 03/29/2012   Depression, recurrent ([place]) 08/13/2008   GERD 08/13/2008   Osteopenia 08/13/2008   COLONIC POLYPS, HX OF 08/13/2008    Conditions to be addressed/monitored:CHF, HTN, and DMII  Care Plan : [place] (Adult)  Updates made by Leona Singleton, RN since 07/02/2021 12:00 AM     Problem: Knowledge deficit related to self care managemenr of chronic  condition   Priority: Medium     Long-Range Goal: Patient will work with CCM team to gain knowledge for better self care monitoring of chronic conditions   Start Date: 05/28/2021  Expected End Date: 05/28/2022  This Visit's Progress: Not on track  Priority: Medium  Note:   tCurrent Barriers:  Knowledge Deficits related to plan of care for management of CHF, HTN, and DMII  Chronic Disease Management support and education needs related to CHF, HTN, and DMII  Patient reporting doing fair.  Fasting blood sugar this morning was 170 with recent averages of 130-150's.   Admits to not checking blood pressures and  weights frequently.  Last weight was 159 pounds.  Denies any chest pains, light headiness, or dizziness; does endorse  shortness of breathas gotten some better since the increase in her Lasix, but still remains SOB with minimal  exertion.  Denies swelling.  RNCM Clinical Goal(s):  Patient will verbalize understanding of plan for management of CHF, HTN, and DMII as evidenced by beginning to monitor blood pressure and daily weight monitor verbalize basic understanding of CHF, HTN, and DMII disease process and self health management plan as evidenced by monitoring vitals demonstrate understanding of rationale for each prescribed medication as evidenced by medication compliance    demonstrate improved health management independence as evidenced by decreasing Hgb A1C by 0.3 points in the next 90 days        through collaboration with RN Care manager, provider, and care team.   Interventions: 1:1 collaboration with primary care provider regarding development and update of comprehensive plan of care as evidenced by provider attestation and co-signature Inter-disciplinary care team collaboration (see longitudinal plan of care) Evaluation of current treatment plan related to  self management and patient's adherence to plan as established by provider   Heart Failure Interventions:  (Status: Goal on track: NO.)  Long Term Goal  Basic overview and discussion of pathophysiology of Heart Failure reviewed Reviewed Heart Failure Action Plan in depth and provided written  copy Advised patient to weigh each morning after emptying bladder Discussed importance of daily weight and advised patient to weigh and record daily Reviewed role of diuretics in prevention of fluid overload and management of heart failure Discussed the importance of keeping all appointments with provider Instructed patient to contact PCP or Cardiology appointment for continued SOB  Diabetes:  (Status: Goal on track: NO.) Long Term Goal   Lab Results  Component Value Date   HGBA1C 8.3 (H) 05/03/2021  Assessed patient's understanding of A1c goal: <7% Provided education to patient about basic DM disease process; Reviewed medications with patient and discussed  importance of medication adherence;        Reviewed prescribed diet with patient low salt carb modified; Counseled on importance of regular laboratory monitoring as prescribed;        Discussed plans with patient for ongoing care management follow up and provided patient with direct contact information for care management team;      Provided patient with written educational materials related to hypo and hyperglycemia and importance of correct treatment;       Advised patient, providing education and rationale, to check cbg three times daily and record         Hypertension: (Status: Goal on track: NO.)  Long Term Goal Last practice recorded BP readings:  BP Readings from Last 3 Encounters:  05/14/21 126/62  05/03/21 118/76  03/12/21 114/76  Most recent eGFR/CrCl: No results found for: EGFR  No components found for: CRCL  Evaluation of current treatment plan related to hypertension self management and patient's adherence to plan as established by provider;   Reviewed medications with patient and discussed importance of compliance;  Discussed plans with patient for ongoing care management follow up and provided patient with direct contact information for care management team; Advised patient, providing education and rationale, to monitor blood pressure daily and record, calling PCP for findings outside established parameters;  Discussed complications of poorly controlled blood pressure such as heart disease, stroke, circulatory complications, vision complications, kidney impairment, sexual dysfunction;   Patient Goals/Self-Care Activities: Take medications as prescribed   Attend all scheduled provider appointments Perform all self care activities independently  Call provider office for new concerns or questions  call office if I gain more than 2 pounds in one day or 5 pounds in one week keep legs up while sitting weigh myself daily bring diary to all appointments develop a rescue  plan follow rescue plan if symptoms flare-up know when to call the doctorbased on weight and action plan track symptoms and what helps feel better or worse check blood sugar at prescribed times: three times daily and when you have symptoms of low or high blood sugar enter blood sugar readings and medication or insulin into daily log take the blood sugar log to all doctor visits take the blood sugar meter to all doctor visits limit fast food meals to no more than 1 per week keep feet up while sitting check blood pressure 3 times per week keep a blood pressure log take blood pressure log to all doctor appointments call doctor for signs and symptoms of high blood pressure develop an action plan for high blood pressure keep all doctor appointments take medications for blood pressure exactly as prescribed Contact provider for continued shortness of breath       Plan:The care management team will reach out to the patient again over the next 21 days.  Hubert Azure RN, MSN RN Care Management Coordinator [place]  [email or web address]

## 2021-07-02 NOTE — Patient Instructions (Addendum)
Visit Information  Thank you for taking time to visit with me today. Please don't hesitate to contact me if I can be of assistance to you before our next scheduled telephone appointment.  Following are the goals we discussed today:  Take medications as prescribed   Attend all scheduled provider appointments Perform all self care activities independently  Call provider office for new concerns or questions  call office if I gain more than 2 pounds in one day or 5 pounds in one week keep legs up while sitting weigh myself daily bring diary to all appointments develop a rescue plan follow rescue plan if symptoms flare-up know when to call the doctorbased on weight and action plan track symptoms and what helps feel better or worse check blood sugar at prescribed times: three times daily and when you have symptoms of low or high blood sugar enter blood sugar readings and medication or insulin into daily log take the blood sugar log to all doctor visits take the blood sugar meter to all doctor visits limit fast food meals to no more than 1 per week keep feet up while sitting check blood pressure 3 times per week keep a blood pressure log take blood pressure log to all doctor appointments call doctor for signs and symptoms of high blood pressure develop an action plan for high blood pressure keep all doctor appointments take medications for blood pressure exactly as prescribed Contact provider for continued shortness of breath   Our next appointment is by telephone on 07/19/21 at 1015  Please call the care guide team at [phone] if you need to cancel or reschedule your appointment.   If you are experiencing a Mental Health or [place] or need someone to talk to, please call the Suicide and Crisis Lifeline: 988 call the [place] National Suicide Prevention Lifeline: 219-152-0384 or TTY: 561-440-6422 TTY 417-721-1925) to talk to a trained counselor go to [facility] [address] [place] [phone]) call 911   Patient verbalizes understanding of instructions provided today and agrees to view in [place].   Hubert Azure RN, MSN RN Care Management Coordinator [place] [phone] [email or web address]

## 2021-07-06 ENCOUNTER — Telehealth: Payer: Self-pay | Admitting: Pharmacist

## 2021-07-06 ENCOUNTER — Ambulatory Visit: Payer: HMO | Admitting: Pharmacist

## 2021-07-06 DIAGNOSIS — I272 Pulmonary hypertension, unspecified: Secondary | ICD-10-CM

## 2021-07-06 DIAGNOSIS — E785 Hyperlipidemia, unspecified: Secondary | ICD-10-CM

## 2021-07-06 DIAGNOSIS — E1122 Type 2 diabetes mellitus with diabetic chronic kidney disease: Secondary | ICD-10-CM

## 2021-07-06 DIAGNOSIS — I5022 Chronic systolic (congestive) heart failure: Secondary | ICD-10-CM

## 2021-07-06 DIAGNOSIS — Z794 Long term (current) use of insulin: Secondary | ICD-10-CM

## 2021-07-06 MED ORDER — FIASP FLEXTOUCH 100 UNIT/ML ~~LOC~~ SOPN: PEN_INJECTOR | SUBCUTANEOUS | 2 refills | Status: AC

## 2021-07-06 NOTE — Telephone Encounter (Signed)
At [facility] visit today, patient was unsure if she had received flu shot this calendar year. CMA reviewed records, patient does not have documentation of receiving flu shot at our office.   Called patient, LVM for her to call our office to schedule a nurse visit or pursue vaccination at her local pharmacy.

## 2021-07-06 NOTE — Patient Instructions (Signed)
Zigmund Daniel,   Continue Ozempic 2 mg weekly, Jardiance 25 mg daily, Tresiba 10 units daily, Fiasp 18 units with breakfast and lunch -  increase Fiasp to 24 units before supper.   Remember, insulin pens are good outside the fridge for 28 days. You can take a pen of Fiasp with you if you are out and about so that you don't miss a dose.   In 2023, the copays for Shingrix (shingles) and Tdap (tetanus, diphtheria, and pertussis) vaccines will be $0. Talk to your local pharmacy about these. You can call our [place] at [facility] at [phone] if you would like to receive these there.   Please call [place] to schedule your mammogram - [phone]  Take care!  Catie Darnelle Maffucci, PharmD  Visit Information  Following are the goals we discussed today:  Patient Goals/Self-Care Activities Over the next 90 days, patient will:  - take medications as prescribed check blood glucose at least three times daily using CGM, document, and provide at future appointments        Plan: Telephone follow up appointment with care management team member scheduled for:  3 months (PCP f/u in 6 weeks)   Catie Darnelle Maffucci, PharmD, [place], CPP Clinical Pharmacist [facility] [phone]   Please call the care guide team at [phone] if you need to cancel or reschedule your appointment.   Patient verbalizes understanding of instructions provided today and agrees to view in [place].

## 2021-07-06 NOTE — Chronic Care Management (AMB) (Signed)
Chronic Care Management CCM Pharmacy Note  07/06/2021 Name:  Haley Young MRN:  [id number] DOB:  [birth year]  Summary: - Tolerating current regimen well. Elevated post prandial readings  Recommendations/Changes made from today's visit: - Increase supper Fiasp to 24 units. Continue 18 units with breakfast and lunch, along with Ozempic 2 mg weekly and Jardiance 25 mg daily.  - Discussed vaccine recommendations. Scheduled PCP f/u  Subjective: Haley Young is an 70 y.o. year old female who is a primary patient of Tullo, Aris Everts, MD.  The CCM team was consulted for assistance with disease management and care coordination needs.    Engaged with patient by telephone for follow up visit for pharmacy case management and/or care coordination services.   Objective:  Medications Reviewed Today     Reviewed by De Hollingshead, RPH-CPP (Pharmacist) on 07/06/21 at 1031  Med List Status: <None>   Medication Order Taking? Sig Documenting Provider Last Dose Status Informant  apixaban (ELIQUIS) 5 MG TABS tablet [id number]  Take 1 tablet (5 mg total) by mouth 2 (two) times daily. [name], MD  Active   atorvastatin (LIPITOR) 80 MG tablet [id number]  TAKE 1 TABLET BY MOUTH EVERY DAY [name], MD  Active   Continuous Blood Gluc Receiver (FREESTYLE LIBRE 2 READER) [place]VI [id number]  Use to check glucose at least TID [name], MD  Active   Continuous Blood Gluc Sensor (FREESTYLE LIBRE 2 SENSOR) [place] [id number]  Use to check glucose at least TID [name], MD  Active   empagliflozin (JARDIANCE) 25 MG TABS tablet [id number] Yes Take 1 tablet (25 mg total) by mouth daily before breakfast. [name], MD Taking Active            Med Note De Hollingshead   Tue Dec 01, 2020  8:36 AM)    ergocalciferol (VITAMIN D2) 1.25 MG (50000 UT) capsule [id number]  Take 1 capsule (50,000 Units total) by mouth once a week. [name], MD  Active   Fludrocortisone Acetate (FLORINEF PO)  [id number]  Take 0.1 mg by mouth daily. [provider]  Active   furosemide (LASIX) 20 MG tablet [id number] Yes Take 40 mg by mouth daily. [provider] Taking Active Self  hydrocortisone (CORTEF) 10 MG tablet [id number]  Take 5 mg by mouth 2 (two) times daily. 10 mg QAM, 5 PM [provider]  Active Self  insulin aspart (FIASP FLEXTOUCH) 100 UNIT/ML FlexTouch Pen [id number] Yes Inject 16 units with breakfast and lunch and 20 units with supper [name], MD Taking Active   insulin degludec (TRESIBA FLEXTOUCH) 100 UNIT/ML FlexTouch Pen [id number] Yes Inject 10 Units into the skin daily. [name], MD Taking Active   levothyroxine (SYNTHROID) 100 MCG tablet [id number]  Take 100 mcg by mouth daily. [provider]  Active   metoprolol succinate (TOPROL-XL) 25 MG 24 hr tablet [id number]  Take 25 mg by mouth daily. [provider]  Active   mirtazapine (REMERON) 15 MG tablet [id number]  Take 1 tablet (15 mg total) by mouth at bedtime. [name], MD  Active   Probiotic Product (PROBIOTIC ADVANCED PO) [id number]  Take 1 capsule by mouth daily. [provider]  Active   Semaglutide, 2 MG/DOSE, 8 MG/3ML SOPN [id number] Yes Inject 2 mg as directed once a week. [name], MD Taking Active   telmisartan (MICARDIS) 20 MG tablet [id number]  Take 20 mg by mouth daily. [provider]  Active   traZODone (DESYREL) 150 MG tablet [id number]  TAKE 1 TABLET BY MOUTH AT BEDTIME. [name], MD  Active   vitamin B-12 (CYANOCOBALAMIN) 500 MCG tablet [id number]  Take 500 mcg by mouth daily. [provider]  Active             Pertinent Labs:   Lab Results  Component Value Date   HGBA1C 8.3 (H) 05/03/2021   Lab Results  Component Value Date   CHOL 103 05/03/2021   HDL 43.40 05/03/2021   LDLCALC 39 05/03/2021   LDLDIRECT 165.0 04/14/2016   TRIG 106.0 05/03/2021   CHOLHDL 2 05/03/2021   Lab Results  Component Value  Date   CREATININE 1.18 06/02/2021   BUN 16 06/02/2021   NA 140 06/02/2021   K 3.1 (L) 06/02/2021   CL 99 06/02/2021   CO2 32 06/02/2021    SDOH:  (Social Determinants of Health) assessments and interventions performed:  SDOH Interventions    Flowsheet Row Most Recent Value  SDOH Interventions   Financial Strain Interventions Intervention Not Indicated       CCM Care Plan  Review of patient past medical history, allergies, medications, health status, including review of consultants reports, laboratory and other test data, was performed as part of comprehensive evaluation and provision of chronic care management services.   Care Plan : Medication Management  Updates made by De Hollingshead, RPH-CPP since 07/06/2021 12:00 AM     Problem: Diabetes, Addison's Disease, Atrial Fibrillation, Osteoporosis      Long-Range Goal: Disease Progression Prevention   Recent Progress: On track  Priority: High  Note:   Current Barriers:  Unable to independently monitor therapeutic efficacy Unable to achieve control of diabetes  Complex patient with multiple comorbidities, including diabetes, Addison's disease, atrial fibrillation, hx pulmonic valve replacement, osteoporosis  Pharmacist Clinical Goal(s):  Over the next 90 days, patient will verbalize ability to afford treatment regimen. Over the next 90 days, patient will achieve adherence to monitoring guidelines and medication adherence to achieve therapeutic efficacy. Over the next 90 days, patient will achieve control of diabetes as evidenced by  improvement in A1c through collaboration with PharmD and provider.   Interventions: 1:1 collaboration with [name], MD regarding development and update of comprehensive plan of care as evidenced by provider attestation and co-signature Inter-disciplinary care team collaboration (see longitudinal plan of care) Comprehensive medication review performed; medication list updated in  electronic medical record  SDOH Over income for patient assistance for [employer] ([place], [place], [place]), [employer] ([place]) or [employer] ([place]). Denies cost concerns with medications at this time  Health Maintenance Yearly diabetic eye exam: up to date Yearly diabetic foot exam: due - placed reminder in upcoming appt note Yearly influenza vaccination: due - though she thinks she received when she was here, will collaborate with office staff Td/Tdap vaccination: due - reminded to pursue in 2023 Pneumonia vaccination: up to date COVID vaccinations: up to date   Shingrix vaccinations: due - reminded to pursue in 2023 Colonoscopy: up to date Bone density scan: up to date Mammogram: up to date Due for PCP f/u. Scheduled appointment and fasting lab appointment prior.   Diabetes: Uncontrolled but improving, current treatment: Ozempic 2 mg weekly, Tresiba 10 units QPM, Jardiance 25 mg daily, Fiasp 16 units with breakfast/lunch and 20 units with lunch or supper  Reports she has done "alright" with remembering to take Novolog prior to meals recently.  Hx metformin,  but discontinued by endocrinology previously d/t elevated lactic acid and risk of lactic acidosis Current glucose readings: using Libre 2 CGM Date of Download: 12/14-12/27/22 % Time CGM is active: 68% Average Glucose: 202 mg/dL Glucose Management Indicator: 8.1%  Glucose Variability: 34.7 (goal <36%) Time in Goal:  - Time in range 70-180: 48% - Time above range: 52% - Time below range: 0% Observed patterns: variable patterns, though generally seeing higher readings post supper Encouraged to discuss nutritionist referral with PCP moving forward Increase supper Fiasp to 24 units. Continue 18 units with breakfast and lunch. Reminded that hse could keep insulin pen outside the fridge for 28 days so she can take a pen with her if she goes out to eat.   Atrial Fibrillation, HTN, hx pulmonic valve  replacement Appropriately managed, last clinic BP at goal; current treatment: metoprolol succinate 25 mg daily; telmisartan 20 mg daily, furosemide 40 mg daily  Anticoagulant regimen: Eliquis 5 mg BID Reports improved SOB with increased furosemide dose. Cardiology follow up scheduled in the next few weeks, along with nephrology. Recommended to continue current regimen at this time.   Hyperlipidemia: Controlled per last lipid panel; current treatment: atorvastatin 80 mg daily  Recommended to continue current regimen at this time.  Addison's Disease Appropriately managed; current treatment; hydrocortisone 10 mg QAM, 5 mg QPM; fludrocortisone 0.1 mg daily Previously recommended to continue current regimen at this time. Referral in place for endocrinology.  Depression/anxiety/insomnia: Appropriately managed per patient symptom report; current treatment: trazodone 150 mg QPM, mirtazapine 15 mg daily  Reports she is sleeping better with mirtazapine dose increase  Hypothyroidism: Controlled per last lab result; levothyroxine 100 mcg daily Previously recommended to continue current regimen at this time  Osteoporosis: Untreated; Will discuss treatment, including appropriate calcium + vitamin D and falls risk reduction moving forward.  Last DEXA 2018 w/ osteopenia, FRAX scores did not meet criteria for treatment. Continue to monitor as appropriate.   Supplementation: Vitamin B12  Patient Goals/Self-Care Activities Over the next 90 days, patient will:  - take medications as prescribed check blood glucose at least three times daily using CGM, document, and provide at future appointments      Plan: Telephone follow up appointment with care management team member scheduled for:  3 months (PCP f/u in 6 weeks)  Catie Darnelle Maffucci, PharmD, [place], [place] Clinical Pharmacist [employer] at [facility] [phone]

## 2021-07-10 DIAGNOSIS — N183 Chronic kidney disease, stage 3 unspecified: Secondary | ICD-10-CM | POA: Diagnosis not present

## 2021-07-10 DIAGNOSIS — E785 Hyperlipidemia, unspecified: Secondary | ICD-10-CM | POA: Diagnosis not present

## 2021-07-10 DIAGNOSIS — E1165 Type 2 diabetes mellitus with hyperglycemia: Secondary | ICD-10-CM

## 2021-07-10 DIAGNOSIS — E1169 Type 2 diabetes mellitus with other specified complication: Secondary | ICD-10-CM

## 2021-07-10 DIAGNOSIS — E1122 Type 2 diabetes mellitus with diabetic chronic kidney disease: Secondary | ICD-10-CM | POA: Diagnosis not present

## 2021-07-10 DIAGNOSIS — I5022 Chronic systolic (congestive) heart failure: Secondary | ICD-10-CM

## 2021-07-10 DIAGNOSIS — Z794 Long term (current) use of insulin: Secondary | ICD-10-CM

## 2021-07-14 ENCOUNTER — Telehealth: Payer: Self-pay | Admitting: Internal Medicine

## 2021-07-14 NOTE — Telephone Encounter (Signed)
Patient called in stated she was calling Janett Billow , returning call . Please call patient @ [phone]

## 2021-07-14 NOTE — Telephone Encounter (Signed)
Message was left for her to call back about her daughter.

## 2021-07-16 ENCOUNTER — Ambulatory Visit (INDEPENDENT_AMBULATORY_CARE_PROVIDER_SITE_OTHER): Payer: HMO

## 2021-07-16 VITALS — Ht 61.0 in | Wt 160.0 lb

## 2021-07-16 DIAGNOSIS — Z1231 Encounter for screening mammogram for malignant neoplasm of breast: Secondary | ICD-10-CM | POA: Diagnosis not present

## 2021-07-16 DIAGNOSIS — Z0000 Encounter for general adult medical examination without abnormal findings: Secondary | ICD-10-CM | POA: Diagnosis not present

## 2021-07-16 NOTE — Progress Notes (Addendum)
Subjective:   Haley Young is a 71 y.o. female who presents for Medicare Annual (Subsequent) preventive examination.  Review of Systems    No ROS.  Medicare Wellness Virtual Visit.  Visual/audio telehealth visit, UTA vital signs.   See social history for additional risk factors.   Cardiac Risk Factors include: advanced age (>39men, >57 women);diabetes mellitus;hypertension     Objective:    Today's Vitals   07/16/21 1033  Weight: 160 lb (72.6 kg)  Height: 5\' 1"  (1.549 m)   Body mass index is 30.23 kg/m.  Advanced Directives 07/16/2021 05/28/2021 05/14/2021 07/15/2020 03/20/2020 03/20/2020 07/15/2019  Does Patient Have a Medical Advance Directive? No No No No No No No  Type of Advance Directive - - - - - - -  Does patient want to make changes to medical advance directive? - - - - - - -  Would patient like information on creating a medical advance directive? No - Patient declined Yes (MAU/Ambulatory/Procedural Areas - Information given) - No - Patient declined No - Patient declined - No - Patient declined    Current Medications (verified) Outpatient Encounter Medications as of 07/16/2021  Medication Sig   apixaban (ELIQUIS) 5 MG TABS tablet Take 1 tablet (5 mg total) by mouth 2 (two) times daily.   atorvastatin (LIPITOR) 80 MG tablet TAKE 1 TABLET BY MOUTH EVERY DAY   Continuous Blood Gluc Receiver (FREESTYLE LIBRE 2 READER) DEVI Use to check glucose at least TID   Continuous Blood Gluc Sensor (FREESTYLE LIBRE 2 SENSOR) MISC Use to check glucose at least TID   empagliflozin (JARDIANCE) 25 MG TABS tablet Take 1 tablet (25 mg total) by mouth daily before breakfast.   ergocalciferol (VITAMIN D2) 1.25 MG (50000 UT) capsule Take 1 capsule (50,000 Units total) by mouth once a week.   Fludrocortisone Acetate (FLORINEF PO) Take 0.1 mg by mouth daily.   furosemide (LASIX) 20 MG tablet Take 40 mg by mouth daily.   hydrocortisone (CORTEF) 10 MG tablet Take 5 mg by mouth 2 (two) times daily. 10 mg  QAM, 5 PM   levothyroxine (SYNTHROID) 100 MCG tablet Take 100 mcg by mouth daily.   metoprolol succinate (TOPROL-XL) 25 MG 24 hr tablet Take 25 mg by mouth daily.   mirtazapine (REMERON) 15 MG tablet Take 1 tablet (15 mg total) by mouth at bedtime.   Probiotic Product (PROBIOTIC ADVANCED PO) Take 1 capsule by mouth daily.   Semaglutide, 2 MG/DOSE, 8 MG/3ML SOPN Inject 2 mg as directed once a week.   telmisartan (MICARDIS) 20 MG tablet Take 20 mg by mouth daily.   traZODone (DESYREL) 150 MG tablet TAKE 1 TABLET BY MOUTH AT BEDTIME.   vitamin B-12 (CYANOCOBALAMIN) 500 MCG tablet Take 500 mcg by mouth daily.   insulin aspart (FIASP FLEXTOUCH) 100 UNIT/ML FlexTouch Pen Inject 16 units with breakfast and lunch and 24 units with supper   insulin degludec (TRESIBA FLEXTOUCH) 100 UNIT/ML FlexTouch Pen Inject 10 Units into the skin daily.   No facility-administered encounter medications on file as of 07/16/2021.    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 Evelina Dun   PARTIAL HYSTERECTOMY  1970   PULMONARY VEIN STENOSIS REPAIR     VALVE REPLACEMENT     Family History  Problem Relation Age of Onset   Coarctation of the aorta Sister    Diabetes Mother    COPD Mother    Obesity Mother     Depression Mother    Pancreatic cancer Mother    Alcohol abuse Father    Cirrhosis Father    Down syndrome Brother    Pneumonia Brother    Down syndrome Daughter    Lupus Daughter    Ulcerative colitis Daughter    Diabetes Maternal Aunt    Breast cancer Maternal Aunt    Breast cancer Maternal Aunt    Social History   Socioeconomic History   Marital status: Married    Spouse name: Not on file   Number of children: 2   Years of education: Not on file   Highest education level: Not on file  Occupational History   Occupation: Retired    Fish farm manager: RETIRED   Occupation: Press photographer   Tobacco Use   Smoking status: Never   Smokeless tobacco: Never  Scientific laboratory technician Use: Never used  Substance and Sexual Activity   Alcohol use: Yes    Comment: occasional wine    Drug use: No   Sexual activity: Yes  Other Topics Concern   Not on file  Social History Narrative   Divorced x 2      Sister is pt here- Surveyor, minerals      Cares for 64 year old mother      50 for daughter with Down's syndrome      Adopted son with Asberger's syndrome      G1P1      Cares for 2 disabled children    Social Determinants of Health   Financial Resource Strain: Low Risk    Difficulty of Paying Living Expenses: Not hard at all  Food Insecurity: No Food Insecurity   Worried About Charity fundraiser in the Last Year: Never true   [place] in the Last Year: Never true  Transportation Needs: No Transportation Needs   Lack of Transportation (Medical): No   Lack of Transportation (Non-Medical): No  Physical Activity: Not on file  Stress: No Stress Concern Present   Feeling of Stress : Not at all  Social Connections: Moderately Integrated   Frequency of Communication with Friends and Family: More than three times a week   Frequency of Social Gatherings with Friends and Family: Never   Attends Religious Services: Never   Marine scientist or Organizations: Yes   Attends Theatre manager Meetings: Never   Marital Status: Married    Tobacco Counseling Counseling given: Not Answered   Clinical Intake:  Pre-visit preparation completed: Yes        Diabetes: Yes (Followed by PCP and Endocrinology)  How often do you  need to have someone help you when you read instructions, pamphlets, or other written materials from your doctor or pharmacy?: 1 - Never  Interpreter Needed?: No    Activities of Daily Living In your present state of health, do you have any difficulty performing the following activities: 07/16/2021  Hearing? N  Vision? N  Difficulty concentrating or making decisions? N  Walking or climbing stairs? Y  Comment Paces self when walking  Dressing or bathing? N  Doing errands, shopping? N  Preparing Food and eating ? N  Using the Toilet? N  In the past six months, have you accidently leaked urine? Y  Comment Followed by Nephrology  Do you have problems with loss of bowel control? N  Managing your Medications? N  Managing your Finances? N  Housekeeping or managing your Housekeeping? N  Some recent data might be hidden   Patient Care Team: [name], MD as PCP - General (Internal Medicine) De Hollingshead, RPH-CPP (Pharmacist) Leona Singleton, RN as [place] any recent Medical Services you may have received from other than [facility] providers in the past year (date may be approximate).     Assessment:   This is a routine wellness examination for [facility].  Virtual Visit via Telephone Note  I connected with  SHAQUANNA LYCAN on 07/16/21 at 10:30 AM EST by telephone and verified that I am speaking with the correct person using two identifiers.  Persons participating in the virtual visit: patient/Nurse Health Advisor   I discussed the limitations, risks, security and privacy concerns of performing an evaluation and management service by telephone and the availability of in person appointments.  The patient expressed understanding and agreed to proceed.  Interactive audio and video telecommunications were attempted between this nurse and patient, however failed, due to patient having technical difficulties OR patient did not have access to video capability.  We continued and completed visit with audio only.  Some vital signs may be absent or patient reported.   Hearing/Vision screen Hearing Screening - Comments:: Patient is able to hear conversational tones without difficulty.  No issues reported. Vision Screening - Comments:: Wears corrective lenses  They have seen their ophthalmologist in the last 12 months.  Dietary issues and exercise activities discussed: Current Exercise Habits: The patient does not participate in regular exercise at present Low carb diet Good water intake    Goals Addressed             This Visit's Progress    Increase physical activity         Depression Screen PHQ 2/9 Scores 07/16/2021 05/28/2021 05/03/2021 03/12/2021 01/13/2021 07/27/2020 07/15/2020  PHQ - 2 Score 0 0 4 5 2  0 0  PHQ- 9 Score - - 16 - 10 2 -    Fall Risk Fall Risk  07/16/2021 05/28/2021 05/03/2021 03/12/2021 01/13/2021  Falls in the past year? 0 0 0 0 0  Comment - - - - -  Number falls in past yr: 0 0 0 0 -  Injury with Fall? - 0 0 0 -  Risk for fall due to : History of fall(s) Impaired vision;Medication side effect - - -  Follow up Falls evaluation completed Falls evaluation completed;Education provided Falls evaluation completed Falls evaluation completed Falls evaluation completed   FALL RISK PREVENTION PERTAINING TO THE HOME: Home free of loose throw rugs in walkways, pet beds, electrical cords, etc? Yes  Adequate lighting in your home to reduce risk  of falls? Yes   ASSISTIVE DEVICES UTILIZED TO PREVENT FALLS: Life alert? Yes  Use of a cane, walker or w/c? No   TIMED UP AND GO: Was the test performed? No .   Cognitive Function: Patient is alert and oriented x3.  MMSE -  Mini Mental State Exam 07/10/2017 07/08/2016  Orientation to time 5 5  Orientation to Place 5 5  Registration 3 3  Attention/ Calculation 5 5  Recall 3 3  Language- name 2 objects 2 2  Language- repeat 1 1  Language- follow 3 step command 3 3  Language- read & follow direction 1 1  Write a sentence 1 1  Copy design 1 1  Total score 30 30     6CIT Screen 07/16/2021 07/15/2020 07/15/2019 07/12/2018  What Year? 0 points 0 points 0 points 0 points  What month? 0 points 0 points 0 points 0 points  What time? 0 points 0 points 0 points 0 points  Count back from 20 0 points 0 points 0 points 0 points  Months in reverse 0 points 0 points 2 points 0 points  Repeat phrase 2 points 0 points 0 points 0 points  Total Score 2 0 2 0    Immunizations Immunization History  Administered Date(s) Administered   Fluad Quad(high Dose 65+) 06/12/2020   H1N1 08/13/2008   Influenza Split 07/28/2011, 06/28/2012   Influenza, High Dose Seasonal PF 04/14/2016, 07/10/2017, 04/11/2018, 05/07/2019   Influenza,inj,Quad PF,6+ Mos 07/25/2013, 08/21/2015   Influenza-Unspecified 07/22/2015   Moderna Sars-Covid-2 Vaccination 08/15/2019, 09/12/2019, 06/24/2020   PNEUMOCOCCAL CONJUGATE-20 01/13/2021   Pneumococcal Conjugate-13 09/13/2013   Pneumococcal Polysaccharide-23 07/30/2011   Zoster, Live 09/13/2013   Screening Tests Health Maintenance  Topic Date Due   MAMMOGRAM  06/19/2021   Zoster Vaccines- Shingrix (1 of 2) 08/03/2021 (Originally 07/25/1969)   FOOT EXAM  08/11/2021 (Originally 09/14/2014)   INFLUENZA VACCINE  10/08/2021 (Originally 02/08/2021)   TETANUS/TDAP  05/03/2022 (Originally 07/25/1969)   HEMOGLOBIN A1C  11/01/2021   OPHTHALMOLOGY EXAM  12/21/2021   COLONOSCOPY (Pts 45-21yrs Insurance coverage will need to be confirmed)  09/14/2022   Pneumonia Vaccine 8+ Years old  Completed   DEXA SCAN  Completed   COVID-19 Vaccine  Completed   Hepatitis C Screening  Completed   HPV VACCINES  Aged Out   Health  Maintenance Health Maintenance Due  Topic Date Due   MAMMOGRAM  06/19/2021   Lung Cancer Screening: (Low Dose CT Chest recommended if Age 68-80 years, 30 pack-year currently smoking OR have quit w/in 15years.) does not qualify.   Vision Screening: Recommended annual ophthalmology exams for early detection of glaucoma and other disorders of the eye. Is the patient up to date with their annual eye exam?  Yes   Dental Screening: Recommended annual dental exams for proper oral hygiene  Community Resource Referral / Chronic Care Management: CRR required this visit?  No   CCM required this visit?  No      Plan:   Keep all routine maintenance appointments.   I have personally reviewed and noted the following in the patients chart:   Medical and social history Use of alcohol, tobacco or illicit drugs  Current medications and supplements including opioid prescriptions. Not taking opioid. Functional ability and status Nutritional status Physical activity Advanced directives List of other physicians Hospitalizations, surgeries, and ER visits in previous 12 months Vitals Screenings to include cognitive, depression, and falls Referrals and appointments  In addition, I have reviewed and discussed with  patient certain preventive protocols, quality metrics, and best practice recommendations. A written personalized care plan for preventive services as well as general preventive health recommendations were provided to patient.     OBrien-Blaney, Fenna Semel L, LPN   11/08/8341    I have reviewed the above information and agree with above.   Deborra Medina, MD

## 2021-07-16 NOTE — Patient Instructions (Addendum)
°  Haley Young , Thank you for taking time to come for your Medicare Wellness Visit. I appreciate your ongoing commitment to your health goals. Please review the following plan we discussed and let me know if I can assist you in the future.   These are the goals we discussed:  Goals       Patient Stated     A1c lower (pt-stated)      Followed by Endocrinologist. [facility].  Last A1c 9.5, Glucose 258      Medication Management (pt-stated)      Patient Goals/Self-Care Activities Over the next 90 days, patient will:  - take medications as prescribed check blood glucose three times daily using CGM, document, and provide at future appointments       Other     Increase physical activity        This is a list of the screening recommended for you and due dates:  Health Maintenance  Topic Date Due   Mammogram  06/19/2021   Zoster (Shingles) Vaccine (1 of 2) 08/03/2021*   Complete foot exam   08/11/2021*   Flu Shot  10/08/2021*   Tetanus Vaccine  05/03/2022*   Hemoglobin A1C  11/01/2021   Eye exam for diabetics  12/21/2021   Colon Cancer Screening  09/14/2022   Pneumonia Vaccine  Completed   DEXA scan (bone density measurement)  Completed   COVID-19 Vaccine  Completed   Hepatitis C Screening: USPSTF Recommendation to screen - Ages 41-79 yo.  Completed   HPV Vaccine  Aged Out  *Topic was postponed. The date shown is not the original due date.

## 2021-07-18 ENCOUNTER — Other Ambulatory Visit: Payer: Self-pay | Admitting: Internal Medicine

## 2021-07-19 ENCOUNTER — Other Ambulatory Visit: Payer: Self-pay | Admitting: Internal Medicine

## 2021-07-19 ENCOUNTER — Encounter: Payer: Self-pay | Admitting: Internal Medicine

## 2021-07-19 ENCOUNTER — Ambulatory Visit (INDEPENDENT_AMBULATORY_CARE_PROVIDER_SITE_OTHER): Payer: HMO | Admitting: *Deleted

## 2021-07-19 DIAGNOSIS — Z794 Long term (current) use of insulin: Secondary | ICD-10-CM

## 2021-07-19 DIAGNOSIS — E1165 Type 2 diabetes mellitus with hyperglycemia: Secondary | ICD-10-CM

## 2021-07-19 DIAGNOSIS — I5022 Chronic systolic (congestive) heart failure: Secondary | ICD-10-CM

## 2021-07-20 NOTE — Telephone Encounter (Signed)
Historical medication. Last OV: 05/03/2021 Next OV: 08/11/2021

## 2021-07-25 NOTE — Chronic Care Management (AMB) (Signed)
Chronic Care Management   CCM RN Visit Note  07/25/2021 Name: Haley Young MRN: [id number] DOB: Sep 19, 1950  Subjective: Haley Young is a 71 y.o. year old female who is a primary care patient of Haley Young, Haley Everts, MD. The care management team was consulted for assistance with disease management and care coordination needs.    Engaged with patient by telephone for follow up visit in response to provider referral for case management and/or care coordination services.   Consent to Services:  The patient was given information about Chronic Care Management services, agreed to services, and gave verbal consent prior to initiation of services.  Please see initial visit note for detailed documentation.   Patient agreed to services and verbal consent obtained.   Assessment: Review of patient past medical history, allergies, medications, health status, including review of consultants reports, laboratory and other test data, was performed as part of comprehensive evaluation and provision of chronic care management services.   SDOH (Social Determinants of Health) assessments and interventions performed:    CCM Care Plan  Allergies  Allergen Reactions   Oxycodone Hcl     REACTION: hallucinations   Albuterol Anxiety    Other reaction(s): Other (See Comments) Tremors    Oxycodone Hcl Other (See Comments)    halucinations    Outpatient Encounter Medications as of 07/19/2021  Medication Sig   apixaban (ELIQUIS) 5 MG TABS tablet Take 1 tablet (5 mg total) by mouth 2 (two) times daily.   atorvastatin (LIPITOR) 80 MG tablet TAKE 1 TABLET BY MOUTH EVERY DAY   Continuous Blood Gluc Receiver (FREESTYLE LIBRE 2 READER) DEVI Use to check glucose at least TID   Continuous Blood Gluc Sensor (FREESTYLE LIBRE 2 SENSOR) MISC USE TO CHECK GLUCOSE AT LEAST 3 TIMES DAILY   empagliflozin (JARDIANCE) 25 MG TABS tablet Take 1 tablet (25 mg total) by mouth daily before breakfast.   ergocalciferol (VITAMIN D2) 1.25 MG  (50000 UT) capsule Take 1 capsule (50,000 Units total) by mouth once a week.   Fludrocortisone Acetate (FLORINEF PO) Take 0.1 mg by mouth daily.   furosemide (LASIX) 20 MG tablet Take 40 mg by mouth daily.   hydrocortisone (CORTEF) 10 MG tablet Take 5 mg by mouth 2 (two) times daily. 10 mg QAM, 5 PM   insulin aspart (FIASP FLEXTOUCH) 100 UNIT/ML FlexTouch Pen Inject 16 units with breakfast and lunch and 24 units with supper   insulin degludec (TRESIBA FLEXTOUCH) 100 UNIT/ML FlexTouch Pen Inject 10 Units into the skin daily.   levothyroxine (SYNTHROID) 100 MCG tablet TAKE 1 TABLET DAILY ON EMPTY STOMACH WITH A GLASS OF WATER AT LEAST 60 MINUTES BEFORE BREAKFAST.   metoprolol succinate (TOPROL-XL) 25 MG 24 hr tablet Take 25 mg by mouth daily.   mirtazapine (REMERON) 15 MG tablet Take 1 tablet (15 mg total) by mouth at bedtime.   Probiotic Product (PROBIOTIC ADVANCED PO) Take 1 capsule by mouth daily.   Semaglutide, 2 MG/DOSE, 8 MG/3ML SOPN Inject 2 mg as directed once a week.   telmisartan (MICARDIS) 20 MG tablet Take 20 mg by mouth daily.   traZODone (DESYREL) 150 MG tablet TAKE 1 TABLET BY MOUTH EVERYDAY AT BEDTIME   vitamin B-12 (CYANOCOBALAMIN) 500 MCG tablet Take 500 mcg by mouth daily.   [DISCONTINUED] Continuous Blood Gluc Sensor (FREESTYLE LIBRE 2 SENSOR) MISC Use to check glucose at least TID   [DISCONTINUED] levothyroxine (SYNTHROID) 100 MCG tablet Take 100 mcg by mouth daily.   No facility-administered encounter medications  on file as of 07/19/2021.    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   Right hip pain 03/12/2021   CKD (chronic kidney disease) stage 3, GFR 30-59 ml/min (HCC) 01/16/2021   Chronic left SI joint pain 05/01/2019   Transaminitis 05/01/2019   Educated about COVID-19 virus infection 11/15/2018   Anemia 07/06/2018   Chronic atrial fibrillation ([place])  05/25/2018   Neuropathic pain of hand 05/03/2018   History of cardiac arrest 05/03/2018   Cerebral infarction, watershed distribution, bilateral, remote, resolved 17/61/6073   Chronic systolic heart failure ([place]) 04/26/2018   Dysphagia, oropharyngeal 04/26/2018   Chronic constipation 04/26/2018   Adrenal insufficiency (Addison's disease) ([place]) 01/20/2018   Hospital discharge follow-up 01/20/2018   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   CHB (complete heart block) ([place]) 08/17/2017   CKD stage 3 due to type 2 diabetes mellitus ([place]) 05/18/2017   Hyponatremia 11/01/2016   Drug-induced vitamin B12 deficiency anemia 04/16/2016   Fatigue 03/27/2015   Overweight (BMI 25.0-29.9) 10/07/2014   Encounter for preventive health examination 09/13/2013   Hyperlipidemia associated with type 2 diabetes mellitus ([place]) 07/25/2013   Insomnia 07/25/2013   Hypothyroidism 03/29/2012   Screening for breast cancer 03/29/2012   Screening for colon cancer 03/29/2012   Depression, recurrent ([place]) 08/13/2008   GERD 08/13/2008   Osteopenia 08/13/2008   COLONIC POLYPS, HX OF 08/13/2008    Conditions to be addressed/monitored:CHF, HTN, and DMII  Care Plan : [place] (Adult)  Updates made by Leona Singleton, RN since 07/25/2021 12:00 AM     Problem: Knowledge deficit related to self care managemenr of chronic  condition   Priority: Medium     Long-Range Goal: Patient will work with CCM team to gain knowledge for better self care monitoring of chronic conditions   Start Date: 05/28/2021  Expected End Date: 05/28/2022  Recent Progress: Not on track  Priority: Medium  Note:   tCurrent Barriers:  Knowledge Deficits related to plan of care for management of CHF, HTN, and DMII  Chronic Disease Management support and education needs related to CHF, HTN, and DMII  Patient reporting doing fair.  Fasting blood sugar this morning was 170 with recent averages of 130-150's.    Admits to not checking blood pressures and  weights frequently.  Last weight was 159 pounds.  Denies any chest pains, light headiness, or dizziness; does endorse  shortness of breathas gotten some better since the increase in her Lasix, but still remains SOB with minimal exertion.  Denies swelling.  07/19/21--Patient reports she isdoing well.  States SOB is better and denies any swelling  RNCM Clinical Goal(s):  Patient will verbalize understanding of plan for management of CHF, HTN, and DMII as evidenced by beginning to monitor blood pressure and daily weight monitor verbalize basic understanding of CHF, HTN, and DMII disease process and self health management plan as evidenced by monitoring vitals demonstrate understanding of rationale for each prescribed medication as evidenced by medication compliance    demonstrate improved health management independence as evidenced by decreasing Hgb A1C by 0.3 points in the next 90 days        through collaboration with RN Care manager, provider, and care team.   Interventions: 1:1 collaboration with primary care provider regarding development and update of comprehensive plan of care as evidenced by provider attestation and co-signature Inter-disciplinary care  team collaboration (see longitudinal plan of care) Evaluation of current treatment plan related to  self management and patient's adherence to plan as established by provider   Heart Failure Interventions:  (Status: Goal on Track (progressing): YES.)  Long Term Goal  Basic overview and discussion of pathophysiology of Heart Failure reviewed Reviewed Heart Failure Action Plan in depth and provided written copy Advised patient to weigh each morning after emptying bladder Discussed importance of daily weight and advised patient to weigh and record daily Reviewed role of diuretics in prevention of fluid overload and management of heart failure Discussed the importance of keeping all appointments with  provider Instructed patient to contact PCP or Cardiology appointment for continued SOB  Diabetes:  (Status: Condition stable. Not addressed this visit. Goal on track: NO.) Long Term Goal   Lab Results  Component Value Date   HGBA1C 8.3 (H) 05/03/2021  Assessed patient's understanding of A1c goal: <7% Provided education to patient about basic DM disease process; Reviewed medications with patient and discussed importance of medication adherence;        Reviewed prescribed diet with patient low salt carb modified; Counseled on importance of regular laboratory monitoring as prescribed;        Discussed plans with patient for ongoing care management follow up and provided patient with direct contact information for care management team;      Provided patient with written educational materials related to hypo and hyperglycemia and importance of correct treatment;       Advised patient, providing education and rationale, to check cbg three times daily and record         Hypertension: (Status: Goal on track: NO.)  Long Term Goal Last practice recorded BP readings:  BP Readings from Last 3 Encounters:  05/14/21 126/62  05/03/21 118/76  03/12/21 114/76  Most recent eGFR/CrCl: No results found for: EGFR  No components found for: CRCL  Evaluation of current treatment plan related to hypertension self management and patient's adherence to plan as established by provider;   Reviewed medications with patient and discussed importance of compliance;  Discussed plans with patient for ongoing care management follow up and provided patient with direct contact information for care management team; Advised patient, providing education and rationale, to monitor blood pressure daily and record, calling PCP for findings outside established parameters;  Discussed complications of poorly controlled blood pressure such as heart disease, stroke, circulatory complications, vision complications, kidney impairment,  sexual dysfunction;   Patient Goals/Self-Care Activities: Take medications as prescribed   Attend all scheduled provider appointments Perform all self care activities independently  Call provider office for new concerns or questions  call office if I gain more than 2 pounds in one day or 5 pounds in one week keep legs up while sitting weigh myself daily bring diary to all appointments develop a rescue plan follow rescue plan if symptoms flare-up know when to call the doctorbased on weight and action plan track symptoms and what helps feel better or worse check blood sugar at prescribed times: three times daily and when you have symptoms of low or high blood sugar enter blood sugar readings and medication or insulin into daily log take the blood sugar log to all doctor visits take the blood sugar meter to all doctor visits limit fast food meals to no more than 1 per week keep feet up while sitting check blood pressure 3 times per week keep a blood pressure log take blood pressure log to all doctor appointments  call doctor for signs and symptoms of high blood pressure develop an action plan for high blood pressure keep all doctor appointments take medications for blood pressure exactly as prescribed Contact provider for shortness of breath       Plan:The care management team will reach out to the patient again over the next 30 days.  Hubert Azure RN, MSN RN Care Management Coordinator [place] [phone] [email or web address]

## 2021-07-25 NOTE — Patient Instructions (Addendum)
Visit Information  Thank you for taking time to visit with me today. Please don't hesitate to contact me if I can be of assistance to you before our next scheduled telephone appointment.  Following are the goals we discussed today:  Take medications as prescribed   Attend all scheduled provider appointments Perform all self care activities independently  Call provider office for new concerns or questions  call office if I gain more than 2 pounds in one day or 5 pounds in one week keep legs up while sitting weigh myself daily bring diary to all appointments develop a rescue plan follow rescue plan if symptoms flare-up know when to call the doctorbased on weight and action plan track symptoms and what helps feel better or worse check blood sugar at prescribed times: three times daily and when you have symptoms of low or high blood sugar enter blood sugar readings and medication or insulin into daily log take the blood sugar log to all doctor visits take the blood sugar meter to all doctor visits limit fast food meals to no more than 1 per week keep feet up while sitting check blood pressure 3 times per week keep a blood pressure log take blood pressure log to all doctor appointments call doctor for signs and symptoms of high blood pressure develop an action plan for high blood pressure keep all doctor appointments take medications for blood pressure exactly as prescribed Contact provider for shortness of breath  Our next appointment is by telephone on 2/8 at 1000  Please call the care guide team at [phone] if you need to cancel or reschedule your appointment.   If you are experiencing a Mental Health or [place] or need someone to talk to, please call the Suicide and Crisis Lifeline: 988 call the [place] National Suicide Prevention Lifeline: 681-207-3616 or TTY: 251-132-1673 TTY (506)087-1187) to talk to a trained counselor call 1-800-273-TALK (toll free, 24  hour hotline) call 911   Patient verbalizes understanding of instructions and care plan provided today and agrees to view in [facility]. Active MyChart status confirmed with patient.    Hubert Azure RN, MSN RN Care Management Coordinator [place] [phone] [email or web address]

## 2021-07-27 ENCOUNTER — Other Ambulatory Visit (HOSPITAL_COMMUNITY): Payer: Self-pay

## 2021-07-27 ENCOUNTER — Other Ambulatory Visit: Payer: Self-pay | Admitting: Internal Medicine

## 2021-07-27 DIAGNOSIS — Z794 Long term (current) use of insulin: Secondary | ICD-10-CM

## 2021-07-27 DIAGNOSIS — E1165 Type 2 diabetes mellitus with hyperglycemia: Secondary | ICD-10-CM

## 2021-07-27 MED ORDER — FIASP FLEXTOUCH 100 UNIT/ML ~~LOC~~ SOPN
PEN_INJECTOR | SUBCUTANEOUS | 2 refills | Status: DC
  Filled 2021-07-27: qty 15, 28d supply, fill #0
  Filled 2021-09-09: qty 15, 28d supply, fill #1
  Filled 2021-10-07: qty 15, 28d supply, fill #2
  Filled 2021-11-12: qty 15, 28d supply, fill #3
  Filled 2021-12-23: qty 15, 28d supply, fill #4
  Filled 2022-01-28: qty 15, 28d supply, fill #5

## 2021-07-28 ENCOUNTER — Other Ambulatory Visit (HOSPITAL_COMMUNITY): Payer: Self-pay

## 2021-07-28 ENCOUNTER — Other Ambulatory Visit: Payer: Self-pay | Admitting: Internal Medicine

## 2021-07-28 ENCOUNTER — Encounter: Payer: Self-pay | Admitting: Internal Medicine

## 2021-07-28 ENCOUNTER — Other Ambulatory Visit: Payer: Self-pay

## 2021-07-28 DIAGNOSIS — E1165 Type 2 diabetes mellitus with hyperglycemia: Secondary | ICD-10-CM

## 2021-07-28 DIAGNOSIS — Z794 Long term (current) use of insulin: Secondary | ICD-10-CM

## 2021-07-28 NOTE — Telephone Encounter (Signed)
I called patient & let her know that fiasp according to our notes should be what she is taking. It was resent to [facility] yesterday bc initially was on no print. Pt will use up the Novolog that she has then start the Fiasp. She will call Elvina Sidle tomorrow to make sure no issues with sending out.

## 2021-08-02 ENCOUNTER — Other Ambulatory Visit: Payer: Self-pay

## 2021-08-02 ENCOUNTER — Ambulatory Visit
Admission: EM | Admit: 2021-08-02 | Discharge: 2021-08-02 | Disposition: A | Discharge status: Home / Self Care | Payer: HMO | Attending: Emergency Medicine | Admitting: Emergency Medicine

## 2021-08-02 ENCOUNTER — Encounter: Payer: Self-pay | Admitting: Emergency Medicine

## 2021-08-02 DIAGNOSIS — H6692 Otitis media, unspecified, left ear: Secondary | ICD-10-CM | POA: Diagnosis not present

## 2021-08-02 MED ORDER — AMOXICILLIN 875 MG PO TABS: 875.0000 mg | ORAL_TABLET | Freq: Two times a day (BID) | ORAL | 0 refills | Status: DC

## 2021-08-02 NOTE — ED Provider Notes (Signed)
Roderic Palau    CSN: [id number] Arrival date & time: 08/02/21  1324      History   Chief Complaint Chief Complaint  Patient presents with   Otalgia    HPI Haley Young is a 71 y.o. female.  Patient presents with 2-day history of left ear pain.  No fever, rash, ear drainage, change in hearing, sore throat, cough, or other symptoms.  Treating at home with her husband's antibiotic eardrops, she does not know the name.  Her medical history includes heart block, pacemaker, heart valve replacement, heart failure, atrial fibrillation, CKD, diabetes, asthma.   The history is provided by the patient and medical records.   Past Medical History:  Diagnosis Date   Asthma    Chicken pox    Depression    Diabetes mellitus    Frequent UTI    GERD (gastroesophageal reflux disease)    Heart disease    Heart murmur    Heart valve replaced    heart valve/pulmonary - replacement    Hx of colonic polyps    Hyperhidrosis    especially with hot flashes     Hyperlipidemia    Lipoma    R axilla    MRSA cellulitis    surgical wound infection   Osteoporosis    Pyelocystitis    as a child    Thyroid disease    Urinary incontinence     Patient Active Problem List   Diagnosis Date Noted   Exertional dyspnea 05/04/2021   Elevated liver enzymes 05/04/2021   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   Right hip pain 03/12/2021   CKD (chronic kidney disease) stage 3, GFR 30-59 ml/min ([place]) 01/16/2021   Chronic left SI joint pain 05/01/2019   Transaminitis 05/01/2019   Educated about COVID-19 virus infection 11/15/2018   Anemia 07/06/2018   Chronic atrial fibrillation ([place]) 05/25/2018   Neuropathic pain of hand 05/03/2018   History of cardiac arrest 05/03/2018   Cerebral infarction, watershed distribution, bilateral, remote, resolved 40/04/2724   Chronic systolic heart failure ([place]) 04/26/2018   Dysphagia, oropharyngeal 04/26/2018   Chronic constipation 04/26/2018   Adrenal  insufficiency (Addison's disease) ([place]) 01/20/2018   Hospital discharge follow-up 01/20/2018   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   CHB (complete heart block) ([place]) 08/17/2017   CKD stage 3 due to type 2 diabetes mellitus ([place]) 05/18/2017   Hyponatremia 11/01/2016   Drug-induced vitamin B12 deficiency anemia 04/16/2016   Fatigue 03/27/2015   Overweight (BMI 25.0-29.9) 10/07/2014   Encounter for preventive health examination 09/13/2013   Hyperlipidemia associated with type 2 diabetes mellitus ([place]) 07/25/2013   Insomnia 07/25/2013   Hypothyroidism 03/29/2012   Screening for breast cancer 03/29/2012   Screening for colon cancer 03/29/2012   Depression, recurrent ([place]) 08/13/2008   GERD 08/13/2008   Osteopenia 08/13/2008   COLONIC POLYPS, HX OF 08/13/2008    Past Surgical History:  Procedure Laterality Date   [place], 2005   pulmonary stenosis with valve replacement, Dr. Evelina Dun   PARTIAL HYSTERECTOMY  1970   PULMONARY VEIN STENOSIS REPAIR     VALVE REPLACEMENT      OB History   No obstetric history on file.      Home Medications    Prior to Admission medications   Medication Sig Start Date End Date Taking? Authorizing Provider  amoxicillin (AMOXIL) 875 MG tablet Take 1 tablet (875 mg total) by mouth 2 (two) times daily  for 10 days. 08/02/21 08/12/21 Yes Sharion Balloon, NP  apixaban (ELIQUIS) 5 MG TABS tablet Take 1 tablet (5 mg total) by mouth 2 (two) times daily. 05/23/18   [name], MD  atorvastatin (LIPITOR) 80 MG tablet TAKE 1 TABLET BY MOUTH EVERY DAY 03/10/21   [name], MD  Continuous Blood Gluc Receiver (FREESTYLE LIBRE 2 READER) DEVI Use to check glucose at least TID 06/16/20   [name], MD  Continuous Blood Gluc Sensor (FREESTYLE LIBRE 2 SENSOR) MISC USE TO CHECK GLUCOSE AT LEAST 3 TIMES DAILY 07/20/21   [name], MD  ergocalciferol (VITAMIN D2) 1.25 MG (50000 UT) capsule  Take 1 capsule (50,000 Units total) by mouth once a week. 02/23/21   [name], MD  Fludrocortisone Acetate (FLORINEF PO) Take 0.1 mg by mouth daily.    [provider]  furosemide (LASIX) 20 MG tablet Take 40 mg by mouth daily.    [provider]  hydrocortisone (CORTEF) 10 MG tablet Take 5 mg by mouth 2 (two) times daily. 10 mg QAM, 5 PM    [provider]  insulin aspart (FIASP FLEXTOUCH) 100 UNIT/ML FlexTouch Pen Inject 16 units with breakfast and lunch and 24 units with supper 07/06/21   [name], MD  insulin aspart (FIASP FLEXTOUCH) 100 UNIT/ML FlexTouch Pen Inject 16 units with breakfast and lunch and 20 units with supper 07/27/21   [name], MD  insulin degludec (TRESIBA FLEXTOUCH) 100 UNIT/ML FlexTouch Pen Inject 10 Units into the skin daily. 05/21/21   [name], MD  JARDIANCE 25 MG TABS tablet TAKE 1 TABLET BY MOUTH DAILY BEFORE BREAKFAST. 07/28/21   [name], MD  levothyroxine (SYNTHROID) 100 MCG tablet TAKE 1 TABLET DAILY ON EMPTY STOMACH WITH A GLASS OF WATER AT LEAST 60 MINUTES BEFORE BREAKFAST. 07/20/21   [name], MD  metoprolol succinate (TOPROL-XL) 25 MG 24 hr tablet Take 25 mg by mouth daily. 03/15/20   [provider]  mirtazapine (REMERON) 15 MG tablet Take 1 tablet (15 mg total) by mouth at bedtime. 05/03/21   [name], MD  Probiotic Product (PROBIOTIC ADVANCED PO) Take 1 capsule by mouth daily.    [provider]  Semaglutide, 2 MG/DOSE, 8 MG/3ML SOPN Inject 2 mg as directed once a week. 05/21/21   [name], MD  telmisartan (MICARDIS) 20 MG tablet Take 20 mg by mouth daily. 05/18/21   [provider]  traZODone (DESYREL) 150 MG tablet TAKE 1 TABLET BY MOUTH EVERYDAY AT BEDTIME 07/19/21   [name], MD  vitamin B-12 (CYANOCOBALAMIN) 500 MCG tablet Take 500 mcg by mouth daily.    [provider]    Family History Family History  Problem Relation Age of Onset    Coarctation of the aorta Sister    Diabetes Mother    COPD Mother    Obesity Mother    Depression Mother    Pancreatic cancer Mother    Alcohol abuse Father    Cirrhosis Father    Down syndrome Brother    Pneumonia Brother    Down syndrome Daughter    Lupus Daughter    Ulcerative colitis Daughter    Diabetes Maternal Aunt    Breast cancer Maternal Aunt    Breast cancer Maternal Aunt     Social History Social History   Tobacco Use   Smoking status: Never   Smokeless tobacco: Never  Vaping Use  Vaping Use: Never used  Substance Use Topics   Alcohol use: Yes    Comment: occasional wine    Drug use: No     Allergies   Oxycodone hcl, Albuterol, and Oxycodone hcl   Review of Systems Review of Systems  Constitutional:  Negative for chills and fever.  HENT:  Positive for ear pain. Negative for congestion and sore throat.   Respiratory:  Negative for cough and shortness of breath.   Cardiovascular:  Negative for chest pain and palpitations.  Gastrointestinal:  Negative for diarrhea and vomiting.  Skin:  Negative for color change and rash.  All other systems reviewed and are negative.   Physical Exam Triage Vital Signs ED Triage Vitals  Enc Vitals Group     BP 08/02/21 1102 119/72     Pulse Rate 08/02/21 1102 78     Resp 08/02/21 1102 18     Temp 08/02/21 1102 97.7 F (36.5 C)     Temp src --      SpO2 08/02/21 1102 95 %     Weight --      Height --      Head Circumference --      Peak Flow --      Pain Score 08/02/21 1114 10     Pain Loc --      Pain Edu? --      Excl. in [place]? --    No data found.  Updated Vital Signs BP 119/72 (BP Location: Left Arm)    Pulse 78    Temp 97.7 F (36.5 C)    Resp 18    SpO2 95%   Visual Acuity Right Eye Distance:   Left Eye Distance:   Bilateral Distance:    Right Eye Near:   Left Eye Near:    Bilateral Near:     Physical Exam Vitals and nursing note reviewed.  Constitutional:      General: She is not in  acute distress.    Appearance: Normal appearance. She is well-developed. She is not ill-appearing.  HENT:     Head: Normocephalic and atraumatic.     Right Ear: Tympanic membrane and ear canal normal.     Left Ear: Ear canal normal. Tympanic membrane is erythematous.     Nose: Nose normal.     Mouth/Throat:     Mouth: Mucous membranes are moist.     Pharynx: Oropharynx is clear.  Cardiovascular:     Rate and Rhythm: Normal rate and regular rhythm.     Heart sounds: Normal heart sounds.  Pulmonary:     Effort: Pulmonary effort is normal. No respiratory distress.     Breath sounds: Normal breath sounds.  Musculoskeletal:     Cervical back: Neck supple.  Skin:    General: Skin is warm and dry.  Neurological:     Mental Status: She is alert.  Psychiatric:        Mood and Affect: Mood normal.        Behavior: Behavior normal.     UC Treatments / Results  Labs (all labs ordered are listed, but only abnormal results are displayed) Labs Reviewed - No data to display  EKG   Radiology No results found.  Procedures Procedures (including critical care time)  Medications Ordered in UC Medications - No data to display  Initial Impression / Assessment and Plan / UC Course  I have reviewed the triage vital signs and the nursing notes.  Pertinent labs & imaging  results that were available during my care of the patient were reviewed by me and considered in my medical decision making (see chart for details).    Left otitis media.  Treating with amoxicillin.  Discussed Tylenol as needed for discomfort.  Instructed her to follow up with her PCP if her symptoms are not improving.  She agrees to plan of care.   Final Clinical Impressions(s) / UC Diagnoses   Final diagnoses:  Left otitis media, unspecified otitis media type     Discharge Instructions      Take the amoxicillin as directed.  Follow up with your primary care provider if your symptoms are not improving.         ED Prescriptions     Medication Sig Dispense Auth. Provider   amoxicillin (AMOXIL) 875 MG tablet Take 1 tablet (875 mg total) by mouth 2 (two) times daily for 10 days. 20 tablet Sharion Balloon, NP      PDMP not reviewed this encounter.   Sharion Balloon, NP 08/02/21 [phone]

## 2021-08-02 NOTE — Discharge Instructions (Addendum)
Take the amoxicillin as directed.  Follow up with your primary care provider if your symptoms are not improving.   ° ° °

## 2021-08-02 NOTE — ED Triage Notes (Signed)
Pt c/o of left ear pain x 2 days

## 2021-08-06 ENCOUNTER — Other Ambulatory Visit: Payer: Self-pay

## 2021-08-06 ENCOUNTER — Telehealth: Payer: Self-pay | Admitting: Internal Medicine

## 2021-08-06 ENCOUNTER — Other Ambulatory Visit (INDEPENDENT_AMBULATORY_CARE_PROVIDER_SITE_OTHER): Payer: HMO

## 2021-08-06 DIAGNOSIS — E1165 Type 2 diabetes mellitus with hyperglycemia: Secondary | ICD-10-CM | POA: Diagnosis not present

## 2021-08-06 DIAGNOSIS — E1169 Type 2 diabetes mellitus with other specified complication: Secondary | ICD-10-CM

## 2021-08-06 DIAGNOSIS — E785 Hyperlipidemia, unspecified: Secondary | ICD-10-CM | POA: Diagnosis not present

## 2021-08-06 DIAGNOSIS — Z794 Long term (current) use of insulin: Secondary | ICD-10-CM | POA: Diagnosis not present

## 2021-08-06 LAB — COMPREHENSIVE METABOLIC PANEL
ALT: 34 U/L (ref 0–35)
AST: 38 U/L — ABNORMAL HIGH (ref 0–37)
Albumin: 3.9 g/dL (ref 3.5–5.2)
Alkaline Phosphatase: 173 U/L — ABNORMAL HIGH (ref 39–117)
BUN: 25 mg/dL — ABNORMAL HIGH (ref 6–23)
CO2: 32 mEq/L (ref 19–32)
Calcium: 9.2 mg/dL (ref 8.4–10.5)
Chloride: 97 mEq/L (ref 96–112)
Creatinine, Ser: 1.27 mg/dL — ABNORMAL HIGH (ref 0.40–1.20)
GFR: 42.09 mL/min — ABNORMAL LOW (ref >60.00)
Glucose, Bld: 340 mg/dL — ABNORMAL HIGH (ref 70–99)
Potassium: 3.9 mEq/L (ref 3.5–5.1)
Sodium: 137 mEq/L (ref 135–145)
Total Bilirubin: 0.5 mg/dL (ref 0.2–1.2)
Total Protein: 7.3 g/dL (ref 6.0–8.3)

## 2021-08-06 LAB — LIPID PANEL
Cholesterol: 192 mg/dL (ref 0–200)
HDL: 51.3 mg/dL (ref >39.00)
NonHDL: 140.93
Total CHOL/HDL Ratio: 4
Triglycerides: 240 mg/dL — ABNORMAL HIGH (ref 0.0–149.0)
VLDL: 48 mg/dL — ABNORMAL HIGH (ref 0.0–40.0)

## 2021-08-06 LAB — LDL CHOLESTEROL, DIRECT: Direct LDL: 114 mg/dL

## 2021-08-06 NOTE — Telephone Encounter (Signed)
Chart has been updated with immunization records.

## 2021-08-06 NOTE — Telephone Encounter (Signed)
Patient dropped off immunization papers from [facility]. Papers are up front in Dr Lupita Dawn color folder.

## 2021-08-09 ENCOUNTER — Telehealth: Payer: Self-pay | Admitting: Internal Medicine

## 2021-08-09 DIAGNOSIS — R7401 Elevation of levels of liver transaminase levels: Secondary | ICD-10-CM

## 2021-08-09 DIAGNOSIS — I442 Atrioventricular block, complete: Secondary | ICD-10-CM

## 2021-08-09 DIAGNOSIS — I482 Chronic atrial fibrillation, unspecified: Secondary | ICD-10-CM

## 2021-08-09 LAB — HEMOGLOBIN A1C: Hgb A1c MFr Bld: 9.3 % — ABNORMAL HIGH (ref 4.6–6.5)

## 2021-08-09 NOTE — Addendum Note (Signed)
Addended by: [name] on: 08/09/2021 05:17 PM   Modules accepted: Orders

## 2021-08-09 NOTE — Telephone Encounter (Signed)
Patient last seen 04/2021. Okay for referrals?

## 2021-08-09 NOTE — Telephone Encounter (Signed)
Patient is having issues with her medical insurance., She needs a referral for Dr.Julius Gershon Cull, at  [facility], she saw him today and will need a referral for today and on. Patient has liver issues. She also needs a referral for Dr Lovett Sox office, [facility].

## 2021-08-10 DIAGNOSIS — Z794 Long term (current) use of insulin: Secondary | ICD-10-CM

## 2021-08-10 DIAGNOSIS — I509 Heart failure, unspecified: Secondary | ICD-10-CM | POA: Diagnosis not present

## 2021-08-10 DIAGNOSIS — E1159 Type 2 diabetes mellitus with other circulatory complications: Secondary | ICD-10-CM

## 2021-08-11 ENCOUNTER — Ambulatory Visit (INDEPENDENT_AMBULATORY_CARE_PROVIDER_SITE_OTHER): Payer: HMO | Admitting: Internal Medicine

## 2021-08-11 ENCOUNTER — Telehealth: Payer: Self-pay

## 2021-08-11 ENCOUNTER — Other Ambulatory Visit: Payer: Self-pay

## 2021-08-11 ENCOUNTER — Encounter: Payer: Self-pay | Admitting: Internal Medicine

## 2021-08-11 VITALS — BP 110/68 | HR 70 | Temp 97.7°F | Ht 61.0 in | Wt 158.0 lb

## 2021-08-11 DIAGNOSIS — E1122 Type 2 diabetes mellitus with diabetic chronic kidney disease: Secondary | ICD-10-CM

## 2021-08-11 DIAGNOSIS — I442 Atrioventricular block, complete: Secondary | ICD-10-CM | POA: Diagnosis not present

## 2021-08-11 DIAGNOSIS — I5022 Chronic systolic (congestive) heart failure: Secondary | ICD-10-CM | POA: Diagnosis not present

## 2021-08-11 DIAGNOSIS — N183 Chronic kidney disease, stage 3 unspecified: Secondary | ICD-10-CM | POA: Diagnosis not present

## 2021-08-11 DIAGNOSIS — E785 Hyperlipidemia, unspecified: Secondary | ICD-10-CM | POA: Diagnosis not present

## 2021-08-11 DIAGNOSIS — I482 Chronic atrial fibrillation, unspecified: Secondary | ICD-10-CM

## 2021-08-11 DIAGNOSIS — F339 Major depressive disorder, recurrent, unspecified: Secondary | ICD-10-CM | POA: Diagnosis not present

## 2021-08-11 DIAGNOSIS — E1169 Type 2 diabetes mellitus with other specified complication: Secondary | ICD-10-CM

## 2021-08-11 MED ORDER — MIRTAZAPINE 30 MG PO TABS: 30.0000 mg | ORAL_TABLET | Freq: Every day | ORAL | 1 refills | Status: DC

## 2021-08-11 NOTE — Progress Notes (Signed)
Subjective:  Patient ID: Haley Young, female    DOB: [birth year]  Age: 71 y.o. MRN: [id number]  CC: The primary encounter diagnosis was Hyperlipidemia associated with type 2 diabetes mellitus ([place]). Diagnoses of CHB (complete heart block) (HCC), Chronic systolic heart failure ([place]), Chronic atrial fibrillation ([place]), Depression, recurrent ([place]), and CKD stage 3 due to type 2 diabetes mellitus ([place]) were also pertinent to this visit.   This visit occurred during the SARS-CoV-2 public health emergency.  Safety protocols were in place, including screening questions prior to the visit, additional usage of staff PPE, and extensive cleaning of exam room while observing appropriate contact time as indicated for disinfecting solutions.    HPI Haley Young presents for  Chief Complaint  Patient presents with   Follow-up    3 month follow up on diabetes     T2DM:  She  feels generally well,  But is not  exercising regularly due to severe exertional dyspnea . Checking  blood sugars  frequently using the CBG,  . Having frequent  hypoglycemic events after breakfast and in the middle of the night ,  eats ice cream at night if her CBG is 100 or lower.    Lacks knowledge on what to eat despite attending multiple diabetes classes.   Using novolog 16 16 and 24 . Taking the Humalog  15 minutes pre meal.  22 units Lantus daily.  Today's breakfast was coffee and oatmeal cookie (standard breakfast)  .  Lunch is banana sandwhich on wheat.  Dinner is Chief Strategy Officer on 1 slice wheat bread;  spaghetti,  salad with grilled chicken,    .  BS have been under 130 fasting and < 150 post prandially.  Denies any recent hypoglyemic events.  Taking  medications as directed. Not Following a carbohydrate modified diet  . Denies numnbness, burning and tingling of extremities. Appetite is good.    Outpatient Medications Prior to Visit  Medication Sig Dispense Refill   apixaban (ELIQUIS) 5 MG TABS tablet Take 1 tablet (5 mg  total) by mouth 2 (two) times daily. 60 tablet 11   atorvastatin (LIPITOR) 80 MG tablet TAKE 1 TABLET BY MOUTH EVERY DAY 90 tablet 1   Continuous Blood Gluc Receiver (FREESTYLE LIBRE 2 READER) DEVI Use to check glucose at least TID 1 each 1   Continuous Blood Gluc Sensor (FREESTYLE LIBRE 2 SENSOR) MISC USE TO CHECK GLUCOSE AT LEAST 3 TIMES DAILY 2 each 11   ergocalciferol (VITAMIN D2) 1.25 MG (50000 UT) capsule Take 1 capsule (50,000 Units total) by mouth once a week. 4 capsule 5   Fludrocortisone Acetate (FLORINEF PO) Take 0.1 mg by mouth daily.     FLUoxetine (PROZAC) 10 MG tablet Take 10 mg by mouth daily.     furosemide (LASIX) 20 MG tablet Take 40 mg by mouth daily.     hydrocortisone (CORTEF) 10 MG tablet Take 5 mg by mouth 2 (two) times daily. 10 mg QAM, 5 PM     insulin aspart (FIASP FLEXTOUCH) 100 UNIT/ML FlexTouch Pen Inject 16 units with breakfast and lunch and 24 units with supper 60 mL 2   insulin aspart (FIASP FLEXTOUCH) 100 UNIT/ML FlexTouch Pen Inject 16 units with breakfast and lunch and 20 units with supper 60 mL 2   insulin degludec (TRESIBA FLEXTOUCH) 100 UNIT/ML FlexTouch Pen Inject 10 Units into the skin daily. 9 mL 1   JARDIANCE 25 MG TABS tablet TAKE 1 TABLET BY MOUTH DAILY BEFORE BREAKFAST. [place]  tablet 3   levothyroxine (SYNTHROID) 100 MCG tablet TAKE 1 TABLET DAILY ON EMPTY STOMACH WITH A GLASS OF WATER AT LEAST 60 MINUTES BEFORE BREAKFAST. 90 tablet 1   metoprolol succinate (TOPROL-XL) 25 MG 24 hr tablet Take 25 mg by mouth daily.     Probiotic Product (PROBIOTIC ADVANCED PO) Take 1 capsule by mouth daily.     Semaglutide, 2 MG/DOSE, 8 MG/3ML SOPN Inject 2 mg as directed once a week. 3 mL 2   telmisartan (MICARDIS) 20 MG tablet Take 20 mg by mouth daily.     traZODone (DESYREL) 150 MG tablet TAKE 1 TABLET BY MOUTH EVERYDAY AT BEDTIME 90 tablet 1   vitamin B-12 (CYANOCOBALAMIN) 500 MCG tablet Take 500 mcg by mouth daily.     mirtazapine (REMERON) 15 MG tablet Take 1 tablet  (15 mg total) by mouth at bedtime. 90 tablet 1   TRESIBA FLEXTOUCH 200 UNIT/ML FlexTouch Pen SMARTSIG:10 Unit(s) SUB-Q Daily     amoxicillin (AMOXIL) 875 MG tablet Take 1 tablet (875 mg total) by mouth 2 (two) times daily for 10 days. (Patient not taking: Reported on 08/11/2021) 20 tablet 0   fludrocortisone (FLORINEF) 0.1 MG tablet Take 100 mcg by mouth daily.     mirtazapine (REMERON) 7.5 MG tablet Take 7.5 mg by mouth at bedtime.     NOVOLOG FLEXPEN 100 UNIT/ML FlexPen SMARTSIG:3 Unit(s) SUB-Q Every Evening     No facility-administered medications prior to visit.    Review of Systems;  Patient denies headache, fevers, malaise, unintentional weight loss, skin rash, eye pain, sinus congestion and sinus pain, sore throat, dysphagia,  hemoptysis , cough, dyspnea, wheezing, chest pain, palpitations, orthopnea, edema, abdominal pain, nausea, melena, diarrhea, constipation, flank pain, dysuria, hematuria, urinary  Frequency, nocturia, numbness, tingling, seizures,  Focal weakness, Loss of consciousness,  Tremor, insomnia, depression, anxiety, and suicidal ideation.      Objective:  BP 110/68 (BP Location: Left Arm, Patient Position: Sitting, Cuff Size: Normal)    Pulse 70    Temp 97.7 F (36.5 C) (Oral)    Ht 5\' 1"  (1.549 m)    Wt 158 lb (71.7 kg)    SpO2 96%    BMI 29.85 kg/m   BP Readings from Last 3 Encounters:  08/11/21 110/68  08/02/21 119/72  05/14/21 126/62    Wt Readings from Last 3 Encounters:  08/11/21 158 lb (71.7 kg)  07/16/21 160 lb (72.6 kg)  05/14/21 160 lb (72.6 kg)    General appearance: alert, cooperative and appears stated age Ears: normal TM's and external ear canals both ears Throat: lips, mucosa, and tongue normal; teeth and gums normal Neck: no adenopathy, no carotid bruit, supple, symmetrical, trachea midline and thyroid not enlarged, symmetric, no tenderness/mass/nodules Back: symmetric, no curvature. ROM normal. No CVA tenderness. Lungs: clear to auscultation  bilaterally Heart: regular rate and rhythm, S1, S2 normal, no murmur, click, rub or gallop Abdomen: soft, non-tender; bowel sounds normal; no masses,  no organomegaly Pulses: 2+ and symmetric Skin: Skin color, texture, turgor normal. No rashes or lesions Lymph nodes: Cervical, supraclavicular, and axillary nodes normal.  Lab Results  Component Value Date   HGBA1C 9.3 (H) 08/06/2021   HGBA1C 8.3 (H) 05/03/2021   HGBA1C 9.2 (H) 01/13/2021    Lab Results  Component Value Date   CREATININE 1.27 (H) 08/06/2021   CREATININE 1.18 06/02/2021   CREATININE 0.97 05/03/2021    Lab Results  Component Value Date   WBC 7.9 05/03/2021   HGB 13.2  05/03/2021   HCT 42.1 05/03/2021   PLT 153.0 05/03/2021   GLUCOSE 340 (H) 08/06/2021   CHOL 192 08/06/2021   TRIG 240.0 (H) 08/06/2021   HDL 51.30 08/06/2021   LDLDIRECT 114.0 08/06/2021   LDLCALC 39 05/03/2021   ALT 34 08/06/2021   AST 38 (H) 08/06/2021   NA 137 08/06/2021   K 3.9 08/06/2021   CL 97 08/06/2021   CREATININE 1.27 (H) 08/06/2021   BUN 25 (H) 08/06/2021   CO2 32 08/06/2021   TSH 2.90 03/12/2021   INR 1.5 (H) 03/20/2020   HGBA1C 9.3 (H) 08/06/2021   MICROALBUR 0.5 06/12/2020    No results found.  Assessment & Plan:   Problem List Items Addressed This Visit     Depression, recurrent ([place])    Tolerating low dose of mirtazapine. Increasing dose today to 30 mg to improve appetite and energy level.   Continue trazodone for.  chronic insomnia       Relevant Medications   FLUoxetine (PROZAC) 10 MG tablet   mirtazapine (REMERON) 30 MG tablet   Hyperlipidemia associated with type 2 diabetes mellitus ([place]) - Primary   Relevant Orders   Hemoglobin A1c   Comprehensive metabolic panel   Lipid panel   CKD stage 3 due to type 2 diabetes mellitus ([place])    Her glycemic control is suboptimal with sugars out of rage 50% of the time, complicated by  episodes of hypoglycemia throughout the day .  Diet addressed.  Reducing the  mealtime insulin by 2 units qac and increasing lantus to 25 untis       [facility] (complete heart block) [facility])   Relevant Orders   Ambulatory referral to Cardiac Electrophysiology   Ambulatory referral to Cardiology   Chronic systolic heart failure [facility])   Relevant Orders   Ambulatory referral to Cardiology   Chronic atrial fibrillation [facility])   Relevant Orders   Ambulatory referral to Cardiology    I spent 30 minutes dedicated to the care of this patient on the date of this encounter to include pre-visit review of patient's medical history,  most recent imaging studies, Face-to-face time with the patient , and post visit ordering of testing and therapeutics.    Follow-up: Return in about 3 months (around 11/08/2021).   [name], MD

## 2021-08-11 NOTE — Patient Instructions (Addendum)
Reduce your novolog doses to 14,   14  and 22 FOR NOW   If you still have low sugars after a dinner meal of salad and/or meat and veggies,  then next time you have that meal reduce the insulin at dinner to 20 units    Increase your Tresiba to 15 units    Catie will follow up before  you run out of novolog (2 weeks)    MAKE SURE YOUR OXYGEN IS NOT DROPPING WHEN YOU WALK IN [facility] .  START WITH 5 MINUTES AT A SLOW PACE   INCREASING THE REMERON TO 30 MG DAILY AT BEDTIME   TRY DRINKING PREMIER PROTEIN SHAKE FOR BREAKFAST,  BUT REDUCE YOUR INSULIN TO 8 UNITS IF YOU DRINK THIS INSTEAD OF YOUR OATMEAL COOKIE   CONSIDER CHANGING THE BANANA TO A FEW SLICES OF [place] OR CHEESE  TO REDUCE THE SUGAR AND INCREASE THE PROTEIN

## 2021-08-11 NOTE — Assessment & Plan Note (Addendum)
Tolerating low dose of mirtazapine. Increasing dose today to 30 mg to improve appetite and energy level.   Continue trazodone for.  chronic insomnia

## 2021-08-11 NOTE — Chronic Care Management (AMB) (Signed)
°  Care Management   Note  08/11/2021 Name: Haley Young MRN: [id number] DOB: 02-13-1951  Haley Young is a 71 y.o. year old female who is a primary care patient of Derrel Nip, Aris Everts, MD and is actively engaged with the care management team. I reached out to [name] by phone today to assist with re-scheduling a follow up visit with the Pharmacist  Follow up plan: Unsuccessful telephone outreach attempt made. A HIPAA compliant phone message was left for the patient providing contact information and requesting a return call.  The care management team will reach out to the patient again over the next 5 days.  If patient returns call to provider office, please advise to call [facility]  at [place], [place], [place], [place] [address] Direct Dial: [phone] [email or web address] Website: [email or web address]

## 2021-08-11 NOTE — Assessment & Plan Note (Signed)
Her glycemic control is suboptimal with sugars out of rage 50% of the time, complicated by  episodes of hypoglycemia throughout the day .  Diet addressed.  Reducing the mealtime insulin by 2 units qac and increasing lantus to 25 untis

## 2021-08-12 NOTE — Chronic Care Management (AMB) (Signed)
°  Care Management   Note  08/12/2021 Name: Haley Young MRN: [id number] DOB: [birth year]  Haley Young is a 71 y.o. year old female who is a primary care patient of Derrel Nip, Aris Everts, MD and is actively engaged with the care management team. I reached out to [name] by phone today to assist with re-scheduling a follow up visit with the Pharmacist  Follow up plan: Face to Face appointment with care management team member scheduled for: 08/30/2021  Noreene Larsson, [place], [place], [place] [address] Direct Dial: [phone] [email or web address] Website: [email or web address]

## 2021-08-17 ENCOUNTER — Telehealth: Payer: Self-pay | Admitting: Internal Medicine

## 2021-08-17 NOTE — Telephone Encounter (Signed)
FYI Dr. Gershon Cull the liver doctor has scheduled the patient for a GT Scan.

## 2021-08-18 ENCOUNTER — Ambulatory Visit (INDEPENDENT_AMBULATORY_CARE_PROVIDER_SITE_OTHER): Payer: HMO | Admitting: *Deleted

## 2021-08-18 ENCOUNTER — Telehealth: Payer: Self-pay

## 2021-08-18 DIAGNOSIS — I272 Pulmonary hypertension, unspecified: Secondary | ICD-10-CM

## 2021-08-18 DIAGNOSIS — E039 Hypothyroidism, unspecified: Secondary | ICD-10-CM

## 2021-08-18 DIAGNOSIS — E271 Primary adrenocortical insufficiency: Secondary | ICD-10-CM

## 2021-08-18 DIAGNOSIS — E1165 Type 2 diabetes mellitus with hyperglycemia: Secondary | ICD-10-CM

## 2021-08-18 DIAGNOSIS — I5022 Chronic systolic (congestive) heart failure: Secondary | ICD-10-CM

## 2021-08-18 DIAGNOSIS — Z794 Long term (current) use of insulin: Secondary | ICD-10-CM

## 2021-08-18 NOTE — Patient Instructions (Addendum)
Visit Information  Thank you for taking time to visit with me today. Please don't hesitate to contact me if I can be of assistance to you before our next scheduled telephone appointment.  Following are the goals we discussed today:  Take medications as prescribed   Attend all scheduled provider appointments Perform all self care activities independently  Call provider office for new concerns or questions  call office if I gain more than 2 pounds in one day or 5 pounds in one week keep legs up while sitting weigh myself daily bring diary to all appointments develop a rescue plan follow rescue plan if symptoms flare-up know when to call the doctorbased on weight and action plan track symptoms and what helps feel better or worse check blood sugar at prescribed times: three times daily and when you have symptoms of low or high blood sugar enter blood sugar readings and medication or insulin into daily log take the blood sugar log to all doctor visits take the blood sugar meter to all doctor visits limit fast food meals to no more than 1 per week keep feet up while sitting check blood pressure 3 times per week keep a blood pressure log take blood pressure log to all doctor appointments call doctor for signs and symptoms of high blood pressure develop an action plan for high blood pressure keep all doctor appointments take medications for blood pressure exactly as prescribed Contact provider for shortness of breath Make sure you eat bedtime snack   Our next appointment is by telephone on 3/1 at 1100  Please call the care guide team at [phone] if you need to cancel or reschedule your appointment.   If you are experiencing a Mental Health or [place] or need someone to talk to, please call the Suicide and Crisis Lifeline: 988 call the [place] National Suicide Prevention Lifeline: 667-867-8766 or TTY: (386)372-3818 TTY 479-004-3144) to talk to a trained  counselor call 1-800-273-TALK (toll free, 24 hour hotline) go to [facility] [address] [place] [phone]) call 911   Patient verbalizes understanding of instructions and care plan provided today and agrees to view in [place]. Active MyChart status confirmed with patient.    Hubert Azure RN, MSN RN Care Management Coordinator [place] [phone] [email or web address]

## 2021-08-18 NOTE — Telephone Encounter (Signed)
Pt was referred to Dr. Ladell Pier at endocrinology [facility]. He is not accepting new pt's. Pt is wanting to know who she needs to be referred to now.

## 2021-08-18 NOTE — Telephone Encounter (Signed)
LMTCB

## 2021-08-19 ENCOUNTER — Other Ambulatory Visit: Payer: Self-pay | Admitting: Internal Medicine

## 2021-08-19 DIAGNOSIS — E310 Autoimmune polyglandular failure: Secondary | ICD-10-CM

## 2021-08-22 NOTE — Chronic Care Management (AMB) (Signed)
Chronic Care Management   CCM RN Visit Note  08/22/2021 Name: Haley Young MRN: [id number] DOB: [name], 1950  Subjective: Haley Young is a 71 y.o. year old female who is a primary care patient of Derrel Nip, Aris Everts, MD. The care management team was consulted for assistance with disease management and care coordination needs.    Engaged with patient by telephone for follow up visit in response to provider referral for case management and/or care coordination services.   Consent to Services:  The patient was given information about Chronic Care Management services, agreed to services, and gave verbal consent prior to initiation of services.  Please see initial visit note for detailed documentation.   Patient agreed to services and verbal consent obtained.   Assessment: Review of patient past medical history, allergies, medications, health status, including review of consultants reports, laboratory and other test data, was performed as part of comprehensive evaluation and provision of chronic care management services.   SDOH (Social Determinants of Health) assessments and interventions performed:    CCM Care Plan  Allergies  Allergen Reactions   Oxycodone Hcl     REACTION: hallucinations   Albuterol Anxiety    Other reaction(s): Other (See Comments) Tremors    Oxycodone Hcl Other (See Comments)    halucinations    Outpatient Encounter Medications as of 08/18/2021  Medication Sig Note   apixaban (ELIQUIS) 5 MG TABS tablet Take 1 tablet (5 mg total) by mouth 2 (two) times daily.    atorvastatin (LIPITOR) 80 MG tablet TAKE 1 TABLET BY MOUTH EVERY DAY    Continuous Blood Gluc Receiver (FREESTYLE LIBRE 2 READER) DEVI Use to check glucose at least TID    Continuous Blood Gluc Sensor (FREESTYLE LIBRE 2 SENSOR) MISC USE TO CHECK GLUCOSE AT LEAST 3 TIMES DAILY    ergocalciferol (VITAMIN D2) 1.25 MG (50000 UT) capsule Take 1 capsule (50,000 Units total) by mouth once a week.    Fludrocortisone  Acetate (FLORINEF PO) Take 0.1 mg by mouth daily.    FLUoxetine (PROZAC) 10 MG tablet Take 10 mg by mouth daily.    furosemide (LASIX) 20 MG tablet Take 40 mg by mouth daily.    hydrocortisone (CORTEF) 10 MG tablet Take 5 mg by mouth 2 (two) times daily. 10 mg QAM, 5 PM    insulin aspart (FIASP FLEXTOUCH) 100 UNIT/ML FlexTouch Pen Inject 16 units with breakfast and lunch and 24 units with supper    insulin aspart (FIASP FLEXTOUCH) 100 UNIT/ML FlexTouch Pen Inject 16 units with breakfast and lunch and 20 units with supper 08/18/2021: Reduce your novolog doses to 14,   14  and 22 FOR NOW    If you still have low sugars after a dinner meal of salad and/or meat and veggies,  then next time you have that meal reduce the insulin at dinner to 20 units     Increase your Tresiba to 15 units   PER PROVIDER INSTRUCTIONS    insulin degludec (TRESIBA FLEXTOUCH) 100 UNIT/ML FlexTouch Pen Inject 10 Units into the skin daily.    JARDIANCE 25 MG TABS tablet TAKE 1 TABLET BY MOUTH DAILY BEFORE BREAKFAST.    levothyroxine (SYNTHROID) 100 MCG tablet TAKE 1 TABLET DAILY ON EMPTY STOMACH WITH A GLASS OF WATER AT LEAST 60 MINUTES BEFORE BREAKFAST.    metoprolol succinate (TOPROL-XL) 25 MG 24 hr tablet Take 25 mg by mouth daily.    mirtazapine (REMERON) 30 MG tablet Take 1 tablet (30 mg total) by mouth  at bedtime.    Probiotic Product (PROBIOTIC ADVANCED PO) Take 1 capsule by mouth daily.    Semaglutide, 2 MG/DOSE, 8 MG/3ML SOPN Inject 2 mg as directed once a week.    telmisartan (MICARDIS) 20 MG tablet Take 20 mg by mouth daily.    traZODone (DESYREL) 150 MG tablet TAKE 1 TABLET BY MOUTH EVERYDAY AT BEDTIME    vitamin B-12 (CYANOCOBALAMIN) 500 MCG tablet Take 500 mcg by mouth daily.    No facility-administered encounter medications on file as of 08/18/2021.    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    Right hip pain 03/12/2021   CKD (chronic kidney disease) stage 3, GFR 30-59 ml/min (HCC) 01/16/2021   Chronic left SI joint pain 05/01/2019   Transaminitis 05/01/2019   Educated about COVID-19 virus infection 11/15/2018   Anemia 07/06/2018   Chronic atrial fibrillation ([place]) 05/25/2018   Neuropathic pain of hand 05/03/2018   History of cardiac arrest 05/03/2018   Cerebral infarction, watershed distribution, bilateral, remote, resolved 79/48/0165   Chronic systolic heart failure ([place]) 04/26/2018   Dysphagia, oropharyngeal 04/26/2018   Chronic constipation 04/26/2018   Adrenal insufficiency (Addison's disease) ([place]) 01/20/2018   Hospital discharge follow-up 01/20/2018   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   CHB (complete heart block) ([place]) 08/17/2017   CKD stage 3 due to type 2 diabetes mellitus ([place]) 05/18/2017   Hyponatremia 11/01/2016   Drug-induced vitamin B12 deficiency anemia 04/16/2016   Fatigue 03/27/2015   Overweight (BMI 25.0-29.9) 10/07/2014   Encounter for preventive health examination 09/13/2013   Hyperlipidemia associated with type 2 diabetes mellitus ([place]) 07/25/2013   Insomnia 07/25/2013   Hypothyroidism 03/29/2012   Screening for breast cancer 03/29/2012   Screening for colon cancer 03/29/2012   Depression, recurrent ([place]) 08/13/2008   GERD 08/13/2008   Osteopenia 08/13/2008   COLONIC POLYPS, HX OF 08/13/2008    Conditions to be addressed/monitored:CHF, HTN, and DMII  Care Plan : [facility] (Adult)  Updates made by Leona Singleton, RN since 08/22/2021 12:00 AM     Problem: Knowledge deficit related to self care managemenr of chronic  condition   Priority: Medium     Long-Range Goal: Patient will work with CCM team to gain knowledge for better self care monitoring of chronic conditions   Start Date: 05/28/2021  Expected End Date: 05/28/2022  Recent Progress: Not on track  Priority: Medium  Note:   tCurrent  Barriers:  Knowledge Deficits related to plan of care for management of CHF, HTN, and DMII  Chronic Disease Management support and education needs related to CHF, HTN, and DMII  Patient reporting doing fair.  Fasting blood sugar this morning was 170 with recent averages of 130-150's.   Admits to not checking blood pressures and  weights frequently.  Last weight was 159 pounds.  Denies any chest pains, light headiness, or dizziness; does endorse  shortness of breath has gotten some better since the increase in her Lasix, but still remains SOB with minimal exertion.  Denies swelling.  07/19/21--Patient reports she is doing well.  States SOB is better and denies any swelling  2/8--Reports feeling well.  Denies any shortness of breath or swelling.  Does admit to frequent hypoglycemic episodes, especially early orings.  Mist higher blood glucose reading are just after eating  RNCM Clinical Goal(s):  Patient will verbalize understanding of  plan for management of CHF, HTN, and DMII as evidenced by beginning to monitor blood pressure and daily weight monitor verbalize basic understanding of CHF, HTN, and DMII disease process and self health management plan as evidenced by monitoring vitals demonstrate understanding of rationale for each prescribed medication as evidenced by medication compliance    demonstrate improved health management independence as evidenced by decreasing Hgb A1C by 0.3 points in the next 90 days        through collaboration with RN Care manager, provider, and care team.   Interventions: 1:1 collaboration with primary care provider regarding development and update of comprehensive plan of care as evidenced by provider attestation and co-signature Inter-disciplinary care team collaboration (see longitudinal plan of care) Evaluation of current treatment plan related to  self management and patient's adherence to plan as established by provider   Heart Failure Interventions:  (Status:  Goal on Track (progressing): YES.)  Long Term Goal  Basic overview and discussion of pathophysiology of Heart Failure reviewed Reviewed Heart Failure Action Plan in depth and provided written copy Advised patient to weigh each morning after emptying bladder Discussed importance of daily weight and advised patient to weigh and record daily Reviewed role of diuretics in prevention of fluid overload and management of heart failure Discussed the importance of keeping all appointments with provider Instructed patient to contact PCP or Cardiology appointment if SOB resumes  Diabetes:  (Status: Goal on track: NO.) Long Term Goal   Lab Results  Component Value Date   HGBA1C 9.3 (H) 08/06/2021  Assessed patient's understanding of A1c goal: <7% Provided education to patient about basic DM disease process; Reviewed medications with patient and discussed importance of medication adherence;        Reviewed prescribed diet with patient low salt carb modified; Counseled on importance of regular laboratory monitoring as prescribed;        Discussed plans with patient for ongoing care management follow up and provided patient with direct contact information for care management team;      Provided patient with written educational materials related to hypo and hyperglycemia and importance of correct treatment;       Advised patient, providing education and rationale, to check cbg three times daily and record        Discussed hypoglycemia and ways to prevent/reduce episodes Sending diabetic diet education Instructed and encouraged bedtime snack  Hypertension: (Status: Condition stable. Not addressed this visit. Goal on track: NO.)  Long Term Goal Last practice recorded BP readings:  BP Readings from Last 3 Encounters:  08/11/21 110/68  08/02/21 119/72  05/14/21 126/62  Most recent eGFR/CrCl: No results found for: EGFR  No components found for: CRCL  Evaluation of current treatment plan related to  hypertension self management and patient's adherence to plan as established by provider;   Reviewed medications with patient and discussed importance of compliance;  Discussed plans with patient for ongoing care management follow up and provided patient with direct contact information for care management team; Advised patient, providing education and rationale, to monitor blood pressure daily and record, calling PCP for findings outside established parameters;  Discussed complications of poorly controlled blood pressure such as heart disease, stroke, circulatory complications, vision complications, kidney impairment, sexual dysfunction;   Patient Goals/Self-Care Activities: Take medications as prescribed   Attend all scheduled provider appointments Perform all self care activities independently  Call provider office for new concerns or questions  call office if I gain more than 2 pounds in one  day or 5 pounds in one week keep legs up while sitting weigh myself daily bring diary to all appointments develop a rescue plan follow rescue plan if symptoms flare-up know when to call the doctorbased on weight and action plan track symptoms and what helps feel better or worse check blood sugar at prescribed times: three times daily and when you have symptoms of low or high blood sugar enter blood sugar readings and medication or insulin into daily log take the blood sugar log to all doctor visits take the blood sugar meter to all doctor visits limit fast food meals to no more than 1 per week keep feet up while sitting check blood pressure 3 times per week keep a blood pressure log take blood pressure log to all doctor appointments call doctor for signs and symptoms of high blood pressure develop an action plan for high blood pressure keep all doctor appointments take medications for blood pressure exactly as prescribed Contact provider for shortness of breath Make sure you eat bedtime  snack       Plan:The care management team will reach out to the patient again over the next 45 days.  Hubert Azure RN, MSN RN Care Management Coordinator [facility] [phone] [email or web address]

## 2021-08-23 ENCOUNTER — Encounter: Payer: Self-pay | Admitting: Internal Medicine

## 2021-08-23 NOTE — Telephone Encounter (Signed)
Sent a mychart message in regards to message

## 2021-08-24 NOTE — Telephone Encounter (Signed)
Pt would like to be called because she has a question to ask the cma

## 2021-08-25 DIAGNOSIS — N1832 Chronic kidney disease, stage 3b: Secondary | ICD-10-CM | POA: Diagnosis not present

## 2021-08-25 DIAGNOSIS — R809 Proteinuria, unspecified: Secondary | ICD-10-CM | POA: Diagnosis not present

## 2021-08-25 DIAGNOSIS — I129 Hypertensive chronic kidney disease with stage 1 through stage 4 chronic kidney disease, or unspecified chronic kidney disease: Secondary | ICD-10-CM | POA: Diagnosis not present

## 2021-08-25 DIAGNOSIS — E1122 Type 2 diabetes mellitus with diabetic chronic kidney disease: Secondary | ICD-10-CM | POA: Diagnosis not present

## 2021-08-25 NOTE — Addendum Note (Signed)
Addended by: Adair Laundry on: 08/25/2021 02:31 PM   Modules accepted: Orders

## 2021-08-25 NOTE — Addendum Note (Signed)
Addended by: Adair Laundry on: 08/25/2021 01:25 PM   Modules accepted: Orders

## 2021-08-25 NOTE — Telephone Encounter (Signed)
The referral to Endocrinology on 08/19/2021 was it sent to [facility] or to [facility] [place] I can not tell by looking at it. If it was sent to [facility] the referral that was signed today can be canceled.

## 2021-08-25 NOTE — Telephone Encounter (Signed)
Spoke with pt today and she stated that she would like to be referred to Dr. Renne Crigler at [place]. She stated that she spoke with them and they told her to put in the referral that the pt saw her back in 2019. Referral is pended.

## 2021-08-25 NOTE — Telephone Encounter (Signed)
Spoke with pt to let her know that the referral was placed on 08/19/2021. Advised pt that she could give their office a call tomorrow to check on the referral.

## 2021-08-26 NOTE — Telephone Encounter (Signed)
See telephone encounter.

## 2021-08-30 ENCOUNTER — Ambulatory Visit: Payer: HMO

## 2021-09-07 DIAGNOSIS — Z794 Long term (current) use of insulin: Secondary | ICD-10-CM

## 2021-09-07 DIAGNOSIS — E1165 Type 2 diabetes mellitus with hyperglycemia: Secondary | ICD-10-CM

## 2021-09-07 DIAGNOSIS — I5022 Chronic systolic (congestive) heart failure: Secondary | ICD-10-CM | POA: Diagnosis not present

## 2021-09-08 ENCOUNTER — Ambulatory Visit (INDEPENDENT_AMBULATORY_CARE_PROVIDER_SITE_OTHER): Payer: HMO | Admitting: *Deleted

## 2021-09-08 DIAGNOSIS — E1165 Type 2 diabetes mellitus with hyperglycemia: Secondary | ICD-10-CM

## 2021-09-08 DIAGNOSIS — E271 Primary adrenocortical insufficiency: Secondary | ICD-10-CM

## 2021-09-08 DIAGNOSIS — Z794 Long term (current) use of insulin: Secondary | ICD-10-CM

## 2021-09-08 NOTE — Chronic Care Management (AMB) (Signed)
Chronic Care Management   CCM RN Visit Note  09/08/2021 Name: Haley Young MRN: [id number] DOB: Jan 10, 1951  Subjective: Haley Young is a 71 y.o. year old female who is a primary care patient of Derrel Nip, Aris Everts, MD. The care management team was consulted for assistance with disease management and care coordination needs.    Engaged with patient by telephone for follow up visit in response to provider referral for case management and/or care coordination services.   Consent to Services:  The patient was given information about Chronic Care Management services, agreed to services, and gave verbal consent prior to initiation of services.  Please see initial visit note for detailed documentation.   Patient agreed to services and verbal consent obtained.   Assessment: Review of patient past medical history, allergies, medications, health status, including review of consultants reports, laboratory and other test data, was performed as part of comprehensive evaluation and provision of chronic care management services.   SDOH (Social Determinants of Health) assessments and interventions performed:    CCM Care Plan  Allergies  Allergen Reactions   Oxycodone Hcl     REACTION: hallucinations   Albuterol Anxiety    Other reaction(s): Other (See Comments) Tremors    Oxycodone Hcl Other (See Comments)    halucinations    Outpatient Encounter Medications as of 09/08/2021  Medication Sig Note   apixaban (ELIQUIS) 5 MG TABS tablet Take 1 tablet (5 mg total) by mouth 2 (two) times daily.    atorvastatin (LIPITOR) 80 MG tablet TAKE 1 TABLET BY MOUTH EVERY DAY    Continuous Blood Gluc Receiver (FREESTYLE LIBRE 2 READER) DEVI Use to check glucose at least TID    Continuous Blood Gluc Sensor (FREESTYLE LIBRE 2 SENSOR) MISC USE TO CHECK GLUCOSE AT LEAST 3 TIMES DAILY    ergocalciferol (VITAMIN D2) 1.25 MG (50000 UT) capsule Take 1 capsule (50,000 Units total) by mouth once a week.    Fludrocortisone  Acetate (FLORINEF PO) Take 0.1 mg by mouth daily.    FLUoxetine (PROZAC) 10 MG tablet Take 10 mg by mouth daily.    furosemide (LASIX) 20 MG tablet Take 40 mg by mouth daily.    hydrocortisone (CORTEF) 10 MG tablet Take 5 mg by mouth 2 (two) times daily. 10 mg QAM, 5 PM    insulin aspart (FIASP FLEXTOUCH) 100 UNIT/ML FlexTouch Pen Inject 16 units with breakfast and lunch and 24 units with supper    insulin aspart (FIASP FLEXTOUCH) 100 UNIT/ML FlexTouch Pen Inject 16 units with breakfast and lunch and 20 units with supper 08/18/2021: Reduce your novolog doses to 14,   14  and 22 FOR NOW    If you still have low sugars after a dinner meal of salad and/or meat and veggies,  then next time you have that meal reduce the insulin at dinner to 20 units     Increase your Tresiba to 15 units   PER PROVIDER INSTRUCTIONS    insulin degludec (TRESIBA FLEXTOUCH) 100 UNIT/ML FlexTouch Pen Inject 10 Units into the skin daily.    JARDIANCE 25 MG TABS tablet TAKE 1 TABLET BY MOUTH DAILY BEFORE BREAKFAST.    levothyroxine (SYNTHROID) 100 MCG tablet TAKE 1 TABLET DAILY ON EMPTY STOMACH WITH A GLASS OF WATER AT LEAST 60 MINUTES BEFORE BREAKFAST.    metoprolol succinate (TOPROL-XL) 25 MG 24 hr tablet Take 25 mg by mouth daily.    mirtazapine (REMERON) 30 MG tablet Take 1 tablet (30 mg total) by mouth  at bedtime.    Probiotic Product (PROBIOTIC ADVANCED PO) Take 1 capsule by mouth daily.    Semaglutide, 2 MG/DOSE, 8 MG/3ML SOPN Inject 2 mg as directed once a week.    telmisartan (MICARDIS) 20 MG tablet Take 20 mg by mouth daily.    traZODone (DESYREL) 150 MG tablet TAKE 1 TABLET BY MOUTH EVERYDAY AT BEDTIME    vitamin B-12 (CYANOCOBALAMIN) 500 MCG tablet Take 500 mcg by mouth daily.    No facility-administered encounter medications on file as of 09/08/2021.    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    Right hip pain 03/12/2021   CKD (chronic kidney disease) stage 3, GFR 30-59 ml/min (HCC) 01/16/2021   Chronic left SI joint pain 05/01/2019   Transaminitis 05/01/2019   Educated about COVID-19 virus infection 11/15/2018   Anemia 07/06/2018   Chronic atrial fibrillation ([place]) 05/25/2018   Neuropathic pain of hand 05/03/2018   History of cardiac arrest 05/03/2018   Cerebral infarction, watershed distribution, bilateral, remote, resolved 79/08/4095   Chronic systolic heart failure ([place]) 04/26/2018   Dysphagia, oropharyngeal 04/26/2018   Chronic constipation 04/26/2018   Adrenal insufficiency (Addison's disease) ([place]) 01/20/2018   Hospital discharge follow-up 01/20/2018   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   CHB (complete heart block) ([place]) 08/17/2017   CKD stage 3 due to type 2 diabetes mellitus ([place]) 05/18/2017   Hyponatremia 11/01/2016   Drug-induced vitamin B12 deficiency anemia 04/16/2016   Fatigue 03/27/2015   Overweight (BMI 25.0-29.9) 10/07/2014   Encounter for preventive health examination 09/13/2013   Hyperlipidemia associated with type 2 diabetes mellitus ([place]) 07/25/2013   Insomnia 07/25/2013   Hypothyroidism 03/29/2012   Screening for breast cancer 03/29/2012   Screening for colon cancer 03/29/2012   Depression, recurrent ([place]) 08/13/2008   GERD 08/13/2008   Osteopenia 08/13/2008   COLONIC POLYPS, HX OF 08/13/2008    Conditions to be addressed/monitored:CHF, HTN, and DMII  Care Plan : [place] (Adult)  Updates made by Leona Singleton, RN since 09/08/2021 12:00 AM     Problem: Knowledge deficit related to self care managemenr of chronic  condition   Priority: Medium     Long-Range Goal: Patient will work with CCM team to gain knowledge for better self care monitoring of chronic conditions   Start Date: 05/28/2021  Expected End Date: 05/28/2022  Recent Progress: Not on track  Priority: Medium  Note:   tCurrent  Barriers:  Knowledge Deficits related to plan of care for management of CHF, HTN, and DMII  Chronic Disease Management support and education needs related to CHF, HTN, and DMII  Patient reporting doing fair.  Fasting blood sugar this morning was 170 with recent averages of 130-150's.   Admits to not checking blood pressures and  weights frequently.  Last weight was 159 pounds.  Denies any chest pains, light headiness, or dizziness; does endorse  shortness of breath has gotten some better since the increase in her Lasix, but still remains SOB with minimal exertion.  Denies swelling.  07/19/21--Patient reports she is doing well.  States SOB is better and denies any swelling  2/8--Reports feeling well.  Denies any shortness of breath or swelling.  Does admit to frequent hypoglycemic episodes, especially early orings.  Mist higher blood glucose reading are just after eating  3/1--Reports SOB on exertion.  Denies edema and reports  feeling less SOB when taking increased dose of lasix.  Has had to change insurance companies to support her [facility] providers.  Has scheduled consultation with Endocrinologist Dr. Cruzita Lederer on 3/2.  Has scheduled liver biopsy for this month also.  Continues to have episodes of hypoglycemia and hyperglycemia, 68 last night, up to 100's after treatment and 115 fasting this morning.  Did not weigh this morning  RNCM Clinical Goal(s):  Patient will verbalize understanding of plan for management of CHF, HTN, and DMII as evidenced by beginning to monitor blood pressure and daily weight monitor verbalize basic understanding of CHF, HTN, and DMII disease process and self health management plan as evidenced by monitoring vitals demonstrate understanding of rationale for each prescribed medication as evidenced by medication compliance    demonstrate improved health management independence as evidenced by decreasing Hgb A1C by 0.3 points in the next 90 days        through collaboration with RN Care  manager, provider, and care team.   Interventions: 1:1 collaboration with primary care provider regarding development and update of comprehensive plan of care as evidenced by provider attestation and co-signature Inter-disciplinary care team collaboration (see longitudinal plan of care) Evaluation of current treatment plan related to  self management and patient's adherence to plan as established by provider   Heart Failure Interventions:  (Status: Goal on Track (progressing): YES.)  Long Term Goal  Basic overview and discussion of pathophysiology of Heart Failure reviewed Reviewed Heart Failure Action Plan in depth and provided written copy Advised patient to weigh each morning after emptying bladder Discussed importance of daily weight and advised patient to weigh and record daily Reviewed role of diuretics in prevention of fluid overload and management of heart failure Discussed the importance of keeping all appointments with provider Instructed patient to contact Cardiology appointment for SOB and does of lasix  Diabetes:  (Status: Goal on track: NO.) Long Term Goal   Lab Results  Component Value Date   HGBA1C 9.3 (H) 08/06/2021  Assessed patient's understanding of A1c goal: <7% Provided education to patient about basic DM disease process; Reviewed medications with patient and discussed importance of medication adherence;        Reviewed prescribed diet with patient low salt carb modified; Counseled on importance of regular laboratory monitoring as prescribed;        Discussed plans with patient for ongoing care management follow up and provided patient with direct contact information for care management team;      Provided patient with written educational materials related to hypo and hyperglycemia and importance of correct treatment;       Advised patient, providing education and rationale, to check cbg three times daily and record        Discussed hypoglycemia and ways to  prevent/reduce episodes Sending diabetic diet education Instructed and encouraged bedtime snack Emotional support and empathy provided to patient  Hypertension: (Status: Condition stable. Not addressed this visit. Goal on track: NO.)  Long Term Goal Last practice recorded BP readings:  BP Readings from Last 3 Encounters:  08/11/21 110/68  08/02/21 119/72  05/14/21 126/62  Most recent eGFR/CrCl: No results found for: EGFR  No components found for: CRCL  Evaluation of current treatment plan related to hypertension self management and patient's adherence to plan as established by provider;   Reviewed medications with patient and discussed importance of compliance;  Discussed plans with patient for ongoing care management follow up and provided patient with direct contact information for care  management team; Advised patient, providing education and rationale, to monitor blood pressure daily and record, calling PCP for findings outside established parameters;  Discussed complications of poorly controlled blood pressure such as heart disease, stroke, circulatory complications, vision complications, kidney impairment, sexual dysfunction;   Patient Goals/Self-Care Activities: Take medications as prescribed   Attend all scheduled provider appointments Perform all self care activities independently  Call provider office for new concerns or questions  call office if I gain more than 2 pounds in one day or 5 pounds in one week keep legs up while sitting weigh myself daily bring diary to all appointments develop a rescue plan follow rescue plan if symptoms flare-up know when to call the doctorbased on weight and action plan track symptoms and what helps feel better or worse check blood sugar at prescribed times: three times daily and when you have symptoms of low or high blood sugar enter blood sugar readings and medication or insulin into daily log take the blood sugar log to all doctor  visits take the blood sugar meter to all doctor visits limit fast food meals to no more than 1 per week keep feet up while sitting check blood pressure 3 times per week keep a blood pressure log take blood pressure log to all doctor appointments call doctor for signs and symptoms of high blood pressure develop an action plan for high blood pressure keep all doctor appointments take medications for blood pressure exactly as prescribed Contact provider for shortness of breath Make sure you eat bedtime snack       Plan:The care management team will reach out to the patient again over the next 25 days.  Hubert Azure RN, MSN RN Care Management Coordinator [place] [phone] [email or web address]

## 2021-09-08 NOTE — Patient Instructions (Addendum)
Visit Information ? ?Thank you for taking time to visit with me today. Please don't hesitate to contact me if I can be of assistance to you before our next scheduled telephone appointment. ? ?Following are the goals we discussed today:  ?Take medications as prescribed   ?Attend all scheduled provider appointments ?Perform all self care activities independently  ?Call provider office for new concerns or questions  ?call office if I gain more than 2 pounds in one day or 5 pounds in one week ?keep legs up while sitting ?weigh myself daily ?bring diary to all appointments ?develop a rescue plan ?follow rescue plan if symptoms flare-up ?know when to call the doctorbased on weight and action plan ?track symptoms and what helps feel better or worse ?check blood sugar at prescribed times: three times daily and when you have symptoms of low or high blood sugar ?enter blood sugar readings and medication or insulin into daily log ?take the blood sugar log to all doctor visits ?take the blood sugar meter to all doctor visits ?limit fast food meals to no more than 1 per week ?keep feet up while sitting ?check blood pressure 3 times per week ?keep a blood pressure log ?take blood pressure log to all doctor appointments ?call doctor for signs and symptoms of high blood pressure ?develop an action plan for high blood pressure ?keep all doctor appointments ?take medications for blood pressure exactly as prescribed ?Contact provider for shortness of breath ?Make sure you eat bedtime snack ? ?Our next appointment is by telephone on 3/24 at 148 ? ?Please call the care guide team at [phone] if you need to cancel or reschedule your appointment.  ? ?If you are experiencing a Mental Health or [place] or need someone to talk to, please call the Suicide and Crisis Lifeline: 988 ?call the [place] National Suicide Prevention Lifeline: 872-545-7744 or TTY: 3302471996 TTY 580 675 4294) to talk to a trained counselor ?call  1-800-273-TALK (toll free, 24 hour hotline) ?call 911  ? ?Patient verbalizes understanding of instructions and care plan provided today and agrees to view in [facility]. Active MyChart status confirmed with patient.   ? ?Hubert Azure RN, MSN ?RN Care Management Coordinator ?[place] ?[phone] ?[email or web address]  ?

## 2021-09-09 ENCOUNTER — Encounter: Payer: Self-pay | Admitting: Internal Medicine

## 2021-09-09 ENCOUNTER — Ambulatory Visit: Payer: Medicare HMO | Admitting: Internal Medicine

## 2021-09-09 ENCOUNTER — Other Ambulatory Visit (HOSPITAL_COMMUNITY): Payer: Self-pay

## 2021-09-09 ENCOUNTER — Other Ambulatory Visit: Payer: Self-pay | Admitting: Internal Medicine

## 2021-09-09 ENCOUNTER — Other Ambulatory Visit: Payer: Self-pay

## 2021-09-09 VITALS — BP 120/72 | HR 92 | Ht 61.0 in | Wt 158.0 lb

## 2021-09-09 DIAGNOSIS — E038 Other specified hypothyroidism: Secondary | ICD-10-CM | POA: Diagnosis not present

## 2021-09-09 DIAGNOSIS — Z794 Long term (current) use of insulin: Secondary | ICD-10-CM | POA: Diagnosis not present

## 2021-09-09 DIAGNOSIS — E063 Autoimmune thyroiditis: Secondary | ICD-10-CM | POA: Diagnosis not present

## 2021-09-09 DIAGNOSIS — E1165 Type 2 diabetes mellitus with hyperglycemia: Secondary | ICD-10-CM

## 2021-09-09 DIAGNOSIS — E271 Primary adrenocortical insufficiency: Secondary | ICD-10-CM | POA: Diagnosis not present

## 2021-09-09 DIAGNOSIS — E1022 Type 1 diabetes mellitus with diabetic chronic kidney disease: Secondary | ICD-10-CM

## 2021-09-09 MED ORDER — BAQSIMI ONE PACK 3 MG/DOSE NA POWD: NASAL | 99 refills | Status: AC

## 2021-09-09 MED ORDER — OZEMPIC (2 MG/DOSE) 8 MG/3ML ~~LOC~~ SOPN
2.0000 mg | PEN_INJECTOR | SUBCUTANEOUS | 2 refills | Status: DC
  Filled 2021-09-09 (×2): qty 3, 28d supply, fill #0
  Filled 2021-10-07 – 2021-10-15 (×2): qty 3, 28d supply, fill #1
  Filled 2021-11-12: qty 3, 28d supply, fill #2

## 2021-09-09 NOTE — Progress Notes (Addendum)
Patient ID: Haley Young, female   DOB: 04/28/1951, 71 y.o.   MRN: [id number]  This visit occurred during the SARS-CoV-2 public health emergency.  Safety protocols were in place, including screening questions prior to the visit, additional usage of staff PPE, and extensive cleaning of exam room while observing appropriate contact time as indicated for disinfecting solutions.   HPI: Haley Young is a 71 y.o.-year-old female, referred by her PCP, Dr.Tullo, for management of insulin-dependent DM2, diagnosed in 2010, uncontrolled, with complications (remote watershed CVA, CKD).  She returns after an absence of more than 3 years.  Here with her husband who offers part of the history, especially about her symptoms, past medical history, and blood sugars.  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  Of note, she also has suspicion for autoimmune hepatitis - Lived Bx pending.  However, reviewing her chart, I do not see clear evidence of autoimmune diabetes.  She did not start insulin since last visit, though. Also, I do not have adrenal antibodies to review.  Insulin-dependent diabetes: Reviewed HbA1c levels: Lab Results  Component Value Date   HGBA1C 9.3 (H) 08/06/2021   HGBA1C 8.3 (H) 05/03/2021   HGBA1C 9.2 (H) 01/13/2021   HGBA1C 13.2 (H) 06/12/2020   HGBA1C 7.8 (H) 05/02/2019   HGBA1C 10.4 (H) 11/27/2018   HGBA1C 9.5 06/29/2018   HGBA1C 8.4 (H) 01/17/2018   HGBA1C 7.0 (H) 05/11/2017   HGBA1C 9.1 08/21/2015   HGBA1C 7.5 (H) 03/12/2015   HGBA1C 7.9 (H) 09/09/2014   HGBA1C 7.8 (H) 03/19/2014   HGBA1C 8.8 (H) 11/27/2013   HGBA1C 8.8 (H) 07/25/2013  05/11/2017: HbA1c calculated from fructosamine is 6.4%. 09/26/2016: HbA1c calculated from fructosamine is slightly higher, at 7.0%. 04/26/2016: HbA1c calculated from fructosamine is 6.38%. 01/25/2016: HbA1c calculated  from fructosamine is 6.35% 08/21/2015: HbA1c calculated from fructosamine is 6.88%  12/18/2014: HbA1c calculated from fructosamine is 6.88% 09/10/2014: HbA1c calculated from fructosamine is 6.9% 03/19/2014:  HbA1c calculated from fructosamine is 6.57%  06/18/2020: C-peptide 1.21 (0.8-3.85)  01/22/2019: C-peptide 1.8 (0.9-4.5), glucose 159 GAD 65 antibodies 0.01 (<  or = 0.02) Insulin antibodies 0 IA-2 antibodies 0 ZnT8 antibodies <15   05/15/2018: C-peptide 1.6, glucose not available  02/13/2018: DHEA-S <2  01/10/2018: C-peptide 1.8 (0.9-4.5), glucose 159 GAD 65 antibodies 0 Insulin antibodies 0 IA-2 antibodies 0 ZnT8 antibodies <15   Pt is on a regimen of:  - Jardiance 25 mg before breakfast - Tresiba 12 units daily - FiAsp 16-16-20 units before each meal - Ozempic 2 mg weekly She was previously on metformin and glipizide ER at our last visit in 2019. She was on Januvia and Alogliptin >> too expensive She was on Jardiance 25 mg daily >> started 01/2016 >> however, potassium increased >> stopped (but also costly) We started Glipizide as we had to stop Invokana >> tremors >> stopped She was on Invokana 300 mg daily >> stopped 2/2 high potassium She was on Invokana 300 mg daily - added 08/2013  - costs her 300$ >> at last visit I gave her a coupon for InvokaMet 2 tablets in am ([phone] mg) daily in am and continued Metformin 1000 mg with dinner. She tells she can get the 3 mo supply for 207%$ Tried insulin Lantus and Novolog - ~2011 Tried Cinnamon and vinegar.  Checks her sugars more than 4 times a day with her freestyle libre 2 CGM obtained  since last visit:   Lowest sugar was 60s; she has hypoglycemia awareness at 75.  She does not a glucagon kit at home. No previous hypoglycemia admission.  Highest sugar was 300. No previous DKA admissions.    Pt's meals are: - Breakfast: cereal - Lunch: Sandwich, soup, salad - Dinner: Salad, chicken, fish - Snacks: 2  - + Mild CKD, last  BUN/creatinine:  Lab Results  Component Value Date   BUN 25 (H) 08/06/2021   BUN 16 06/02/2021   CREATININE 1.27 (H) 08/06/2021   CREATININE 1.18 06/02/2021  On telmisartan 20 mg daily.  - + HL; last set of lipids: Lab Results  Component Value Date   CHOL 192 08/06/2021   HDL 51.30 08/06/2021   LDLCALC 39 05/03/2021   LDLDIRECT 114.0 08/06/2021   TRIG 240.0 (H) 08/06/2021   CHOLHDL 4 08/06/2021  On Lipitor 80 mg daily.  - last eye exam was in 12/2020. No DR.   - no numbness and tingling in her feet.  Addison's disease: -Diagnosed in 2019 and 2020 -elevated ACTH, at 102 (15-66), undetectable aldosterone.  She is on hydrocortisone 10 mg in a.m. and 5 mg in p.m.  On Florinef 0.1 mg daily.   She did double the dose around the time when her daughter was sick, but did not have to get the medication parenterally recently.  She has increase fatigue, headaches. She does not have weight loss, joint aches, AP. No salt craving.   She has a med alert bracelet mentioning Addison's disease.  Single  Hashimoto's thyroiditis:  She is on levothyroxine 100 mcg daily: - at night! - no calcium - no iron - no multivitamins - no PPIs - not on Biotin  Reviewed TSH levels: Lab Results  Component Value Date   TSH 2.90 03/12/2021   TSH 4.112 03/20/2020   TSH 0.76 05/02/2019   TSH 0.29 (A) 08/30/2018   TSH 3.54 01/17/2018   TSH 6.36 (H) 05/10/2017   TSH 1.98 04/14/2016   TSH 0.37 01/25/2016   TSH 2.38 08/21/2015   TSH 6.32 (H) 03/12/2015   TSH 10.17 (H) 12/16/2014   TSH 7.08 (H) 09/09/2014   TSH 3.06 09/13/2013   TSH 4.82 06/28/2012   TSH 6.72 (H) 03/29/2012   TSH 7.23 (H) 08/24/2011   TSH 6.95 (H) 03/05/2010   TSH 7.76 (H) 12/08/2009   TSH 4.28 08/13/2008   No FH of thyroid cancer. No h/o radiation tx to head or neck.  No herbal supplements. No Biotin use. No recent steroids use.   She continues to see [facility]ology at [facility] (Dr. Leonides Schanz). She had a catheterization >> no  blockages.  She has pulmonary valve insufficiency.  She also had a pacemaker implanted. She was now also diagnosed with CHF. She and her husband also described that she had an incident in which her heart stopped completely and she had to be resuscitated while traveling out of town after our last visit. She has vitamin B12 and vitamin D deficiency, also, chronic fatigue. She had celiac investigation (blood test) in 2020 and this was negative  ROS: Constitutional: no weight gain, no weight loss, + fatigue, +  subjective hyperthermia, no subjective hypothermia, + nocturia, + poor sleep Eyes: no blurry vision, no xerophthalmia ENT: no sore throat, no nodules palpated in neck, no dysphagia, no odynophagia, no hoarseness, no tinnitus, no hypoacusis Cardiovascular: no CP, + SOB, no palpitations, + leg swelling Respiratory: no cough, + SOB, no wheezing Gastrointestinal: no N, no V, no D,  no C, no acid reflux Musculoskeletal: no muscle, no joint aches Skin: no rash, no hair loss, + easy bruising Neurological: no tremors, no numbness or tingling/no dizziness/+ HAs Psychiatric: no depression, no anxiety  Past Medical History:  Diagnosis Date   Asthma    Chicken pox    Depression    Diabetes mellitus    Frequent UTI    GERD (gastroesophageal reflux disease)    Heart disease    Heart murmur    Heart valve replaced    heart valve/pulmonary - replacement    Hx of colonic polyps    Hyperhidrosis    especially with hot flashes     Hyperlipidemia    Lipoma    R axilla    MRSA cellulitis    surgical wound infection   Osteoporosis    Pyelocystitis    as a child    Thyroid disease    Urinary incontinence    Past Surgical History:  Procedure Laterality Date   [place], 2005   pulmonary stenosis with valve replacement, Dr. Evelina Dun   PARTIAL HYSTERECTOMY  1970   PULMONARY VEIN STENOSIS REPAIR     VALVE REPLACEMENT     Social History    Socioeconomic History   Marital status: Married    Spouse name: Not on file   Number of children: 2   Years of education: Not on file   Highest education level: Not on file  Occupational History   Occupation: Retired    Fish farm manager: RETIRED   Occupation: Sales   Tobacco Use   Smoking status: Never   Smokeless tobacco: Never  Vaping Use   Vaping Use: Never used  Substance and Sexual Activity   Alcohol use: Yes    Comment: occasional wine    Drug use: No   Sexual activity: Yes  Other Topics Concern   Not on file  Social History Narrative   Divorced x 2      Sister is pt here- Surveyor, minerals      Cares for 72 year old mother      35 for daughter with Down's syndrome      Adopted son with Asberger's syndrome      G1P1      Cares for 2 disabled children    Social Determinants of Health   Financial Resource Strain: Low Risk    Difficulty of Paying Living Expenses: Not hard at all  Food Insecurity: No Food Insecurity   Worried About Charity fundraiser in the Last Year: Never true   Arboriculturist in the Last Year: Never true  Transportation Needs: No Transportation Needs   Lack of Transportation (Medical): No   Lack of Transportation (Non-Medical): No  Physical Activity: Not on file  Stress: No Stress Concern Present   Feeling of Stress : Not at all  Social Connections: Moderately Integrated   Frequency of Communication with Friends and Family: More than three times a week   Frequency of Social Gatherings with Friends and Family: Never   Attends Religious Services: Never   Marine scientist or Organizations: Yes   Attends Archivist Meetings: Never   Marital Status: Married  Human resources officer Violence: Not At Risk   Fear of Current or Ex-Partner: No   Emotionally Abused: No   Physically Abused: No   Sexually Abused: No   Current Outpatient Medications on File Prior to Visit  Medication Sig Dispense  Refill   apixaban (ELIQUIS) 5 MG TABS  tablet Take 1 tablet (5 mg total) by mouth 2 (two) times daily. 60 tablet 11   atorvastatin (LIPITOR) 80 MG tablet TAKE 1 TABLET BY MOUTH EVERY DAY 90 tablet 1   Continuous Blood Gluc Receiver (FREESTYLE LIBRE 2 READER) DEVI Use to check glucose at least TID 1 each 1   Continuous Blood Gluc Sensor (FREESTYLE LIBRE 2 SENSOR) MISC USE TO CHECK GLUCOSE AT LEAST 3 TIMES DAILY 2 each 11   ergocalciferol (VITAMIN D2) 1.25 MG (50000 UT) capsule Take 1 capsule (50,000 Units total) by mouth once a week. 4 capsule 5   Fludrocortisone Acetate (FLORINEF PO) Take 0.1 mg by mouth daily.     furosemide (LASIX) 20 MG tablet Take 40 mg by mouth daily.     hydrocortisone (CORTEF) 10 MG tablet Take 5 mg by mouth 2 (two) times daily. 10 mg QAM, 5 PM     insulin aspart (FIASP FLEXTOUCH) 100 UNIT/ML FlexTouch Pen Inject 16 units with breakfast and lunch and 24 units with supper 60 mL 2   insulin aspart (FIASP FLEXTOUCH) 100 UNIT/ML FlexTouch Pen Inject 16 units with breakfast and lunch and 20 units with supper 60 mL 2   insulin degludec (TRESIBA FLEXTOUCH) 100 UNIT/ML FlexTouch Pen Inject 10 Units into the skin daily. 9 mL 1   JARDIANCE 25 MG TABS tablet TAKE 1 TABLET BY MOUTH DAILY BEFORE BREAKFAST. 90 tablet 3   levothyroxine (SYNTHROID) 100 MCG tablet TAKE 1 TABLET DAILY ON EMPTY STOMACH WITH A GLASS OF WATER AT LEAST 60 MINUTES BEFORE BREAKFAST. 90 tablet 1   metoprolol succinate (TOPROL-XL) 25 MG 24 hr tablet Take 25 mg by mouth daily.     mirtazapine (REMERON) 30 MG tablet Take 1 tablet (30 mg total) by mouth at bedtime. 90 tablet 1   Probiotic Product (PROBIOTIC ADVANCED PO) Take 1 capsule by mouth daily.     Semaglutide, 2 MG/DOSE, 8 MG/3ML SOPN Inject 2 mg as directed once a week. 3 mL 2   telmisartan (MICARDIS) 20 MG tablet Take 20 mg by mouth daily.     traZODone (DESYREL) 150 MG tablet TAKE 1 TABLET BY MOUTH EVERYDAY AT BEDTIME 90 tablet 1   vitamin B-12 (CYANOCOBALAMIN) 500 MCG tablet Take 500 mcg by mouth  daily.     No current facility-administered medications on file prior to visit.   Allergies  Allergen Reactions   Oxycodone Hcl     REACTION: hallucinations   Albuterol Anxiety    Other reaction(s): Other (See Comments) Tremors    Oxycodone Hcl Other (See Comments)    halucinations   Family History  Problem Relation Age of Onset   Coarctation of the aorta Sister    Diabetes Mother    COPD Mother    Obesity Mother    Depression Mother    Pancreatic cancer Mother    Alcohol abuse Father    Cirrhosis Father    Down syndrome Brother    Pneumonia Brother    Down syndrome Daughter    Lupus Daughter    Ulcerative colitis Daughter    Diabetes Maternal Aunt    Breast cancer Maternal Aunt    Breast cancer Maternal Aunt    PE: BP 120/72 (BP Location: Right Arm, Patient Position: Sitting, Cuff Size: Normal)    Pulse 92    Ht '5\' 1"'  (1.549 m)    Wt 158 lb (71.7 kg)    SpO2 96%    BMI  29.85 kg/m  Wt Readings from Last 3 Encounters:  09/09/21 158 lb (71.7 kg)  08/11/21 158 lb (71.7 kg)  07/16/21 160 lb (72.6 kg)   Constitutional: normal weight, in NAD Eyes: PERRLA, EOMI, no exophthalmos ENT: moist mucous membranes, no thyromegaly, no cervical lymphadenopathy Cardiovascular: RRR, No MRG Respiratory: CTA B Gastrointestinal: abdomen soft, NT, ND, BS+ Musculoskeletal: no deformities, strength intact in all 4 Skin: moist, warm, no rashes Neurological: no tremor with outstretched hands, DTR normal in all 4  ASSESSMENT: 1.  Insulin-dependent diabetes, uncontrolled, with complications - CKD - CVA  2.  Addison's disease  3.  Hashimoto's hypothyroidism  PLAN:  1. Patient with history of diabetes, diagnosed as type I in the last years.  She is now on basal-bolus insulin regimen along with SGLT2 inhibitor and weekly GLP-1 receptor agonist.  She tolerates these well. -Most recent HbA1c was 9.3% last month, increased from 8.3%. -Today's visit, she is now on a CGM, and we reviewed the  downloads together CGM interpretation: -At today's visit, we reviewed her CGM downloads: It appears that 70% of values are in target range (goal >70%), while 29% are higher than 180 (goal <25%), and 1% are lower than 70 (goal <4%).  The calculated average blood sugar is 161.  The projected HbA1c for the next 3 months (GMI) is 7.2%. -Reviewing the CGM trends, sugars are fairly stable overnight, slightly decreasing before 6 AM, but they increase abruptly after breakfast.  They do improve afterwards and they remain variable later in the day, with again a milder postprandial excursion after dinner. -At this visit, however, upon questioning, she is taking Fiasp after meals and we discussed that this may lead to initial increase in postprandial blood sugars, followed by a steep decrease.  I advised her to move this before the meal and if the sugars remain high after the meals to try to move it 10 to 15 minutes in advance.  If this is not working, we may need to increase her Fiasp doses.  However, they are large enough for now to think that the abnormal blood sugars are related with the timing of the dose rather than the amount of insulin that she is taking.  I did advise him to let me know if sugars are still low, in which case we need to back off Fiasp doses. - I do not have evidence of autoimmunity or low C-peptide to suggest insulin deficiency.  Investigation was done in the past (see HPI) and I suspect that she definitely does have a degree of insulin deficiency.  We may need to recheck her antibodies at next visit. - I suggested to: Patient Instructions  Please continue hydrocortisone 10 mg in am and 5 mg - around 2 pm (no later than 6 pm). Continue Florinef 0.1 mg daily.  Please continue 100 mcg Levothyroxine for now. Take the thyroid hormone every day, with water, at least 30 minutes before breakfast, separated by at least 4 hours from: - acid reflux medications - calcium - iron -  multivitamins  Please move the levothyroxine to am.  Please continue: - Jardiance 25 mg before breakfast - Tresiba 12 units daily - FiAsp 16-16-20 units but take these before each meal (if the sugars remain high after meals, may need to move FiAsp 10-15 min before a meal) - Ozempic 2 mg weekly  Please return in 3 months.  - continue checking sugars at different times of the day - check at least 4 times a day,  rotating checks - given foot care handout and explained the principles  - given instructions for hypoglycemia management "15-15 rule"  - advised for yearly eye exams - sent a nasal glucagon kit Rx to pharmacy and I explained how this works - given instruction Re: exercising and driving in DM1 (pt instructions) - also, given information about sick day rules - Return to clinic in 3 months  2.  Adrenal insufficiency  - I do not have records of 21-hydroxylase antibodies-plan to check this at next visit to confirm the autoimmune origin of her condition - Diagnosed since last visit - She is properly replaced with hydrocortisone 10 mg in a.m. and 5 mg in p.m. She is currently taking the 5 mg of hydrocortisone at bedtime and I advised her to try to move it earlier. - she does have chronic fatigue, but no indication of under replacement with hydrocortisone - Discussed about sick day rules: Double the dose if she has fever or increased stress, obtain the medication intramuscularly or intravenously if she cannot take the p.o. she already has the solution to inject as needed.  She is also aware about doubling the dose, which she actually did when her daughter was sick in preparation for possibly getting sick herself -She does have a med alert bracelet indicating Addison's disease  3.  Hashimoto's hypothyroidism - latest thyroid labs reviewed with pt. >> normal: Lab Results  Component Value Date   TSH 2.90 03/12/2021  - she continues on LT4 100 mcg daily - we discussed about taking the  thyroid hormone every day, with water, >30 minutes before breakfast, separated by >4 hours from acid reflux medications, calcium, iron, multivitamins. Pt. is taking it incorrectly -currently taking levothyroxine at night.  I advised her to move into the morning.  Philemon Kingdom, MD PhD [facility] Endocrinology

## 2021-09-09 NOTE — Patient Instructions (Addendum)
Please continue hydrocortisone 10 mg in am and 5 mg - around 2 pm (no later than 6 pm). ?Continue Florinef 0.1 mg daily. ? ?Please continue 100 mcg Levothyroxine for now. Take the thyroid hormone every day, with water, at least 30 minutes before breakfast, separated by at least 4 hours from: ?- acid reflux medications ?- calcium ?- iron ?- multivitamins ? ?Please move the levothyroxine to am. ? ?Please continue: ?- Jardiance 25 mg before breakfast ?- Tresiba 12 units daily ?- FiAsp 16-16-20 units but take these before each meal (if the sugars remain high after meals, may need to move FiAsp 10-15 min before a meal) ?- Ozempic 2 mg weekly ? ?Please return in 3 months. ? ?PATIENT INSTRUCTIONS FOR TYPE 2 DIABETES: ? ?**Please join MyChart!** - see attached instructions about how to join if you have not done so already. ? ?DIET AND EXERCISE ?Diet and exercise is an important part of diabetic treatment.  ?We recommended aerobic exercise in the form of brisk walking (working between 40-60% of maximal aerobic capacity, similar to brisk walking) for 150 minutes per week (such as 30 minutes five days per week) along with 3 times per week performing 'resistance' training (using various gauge rubber tubes with handles) 5-10 exercises involving the major muscle groups (upper body, lower body and core) performing 10-15 repetitions (or near fatigue) each exercise. Start at half the above goal but build slowly to reach the above goals. If limited by weight, joint pain, or disability, we recommend daily walking in a swimming pool with water up to waist to reduce pressure from joints while allow for adequate exercise.   ? ?BLOOD GLUCOSES ?Monitoring your blood glucoses is important for continued management of your diabetes. Please check your blood glucoses 2-4 times a day: fasting, before meals and at bedtime (you can rotate these measurements - e.g. one day check before the 3 meals, the next day check before 2 of the meals and  before bedtime, etc.).  ? ?HYPOGLYCEMIA (low blood sugar) ?Hypoglycemia is usually a reaction to not eating, exercising, or taking too much insulin/ other diabetes drugs.  ?Symptoms include tremors, sweating, hunger, confusion, headache, etc. ?Treat IMMEDIATELY with 15 grams of Carbs: ?4 glucose tablets ?? cup regular juice/soda ?2 tablespoons raisins ?4 teaspoons sugar ?1 tablespoon honey ?Recheck blood glucose in 15 mins and repeat above if still symptomatic/blood glucose <100. ? ?RECOMMENDATIONS TO REDUCE YOUR RISK OF DIABETIC COMPLICATIONS: ?* Take your prescribed MEDICATION(S) ?* Follow a DIABETIC diet: Complex carbs, fiber rich foods, (monounsaturated and polyunsaturated) fats ?* AVOID saturated/trans fats, high fat foods, >2,300 mg salt per day. ?* EXERCISE at least 5 times a week for 30 minutes or preferably daily.  ?* DO NOT SMOKE OR DRINK more than 1 drink a day. ?* Check your FEET every day. Do not wear tightfitting shoes. Contact us if you develop an ulcer ?* See your EYE doctor once a year or more if needed ?* Get a FLU shot once a year ?* Get a PNEUMONIA vaccine once before and once after age 54 years ? ?GOALS:  ?* Your Hemoglobin A1c of <7%  ?* fasting sugars need to be <130 ?* after meals sugars need to be <180 (2h after you start eating) ?* Your Systolic BP should be 035 or lower  ?* Your Diastolic BP should be 80 or lower  ?* Your HDL (Good Cholesterol) should be 40 or higher  ?* Your LDL (Bad Cholesterol) should be 100 or lower. ?* Your Triglycerides  should be 150 or lower  ?* Your Urine microalbumin (kidney function) should be <30 ?* Your Body Mass Index should be 25 or lower  ? ? ?Please consider the following ways to cut down carbs and fat and increase fiber and micronutrients in your diet: ?- substitute whole grain for white bread or pasta ?- substitute brown rice for white rice ?- substitute 90-calorie flat bread pieces for slices of bread when possible ?- substitute sweet potatoes or yams  for white potatoes ?- substitute humus for margarine ?- substitute tofu for cheese when possible ?- substitute almond or rice milk for regular milk (would not drink soy milk daily due to concern for soy estrogen influence on breast cancer risk) ?- substitute dark chocolate for other sweets when possible ?- substitute water - can add lemon or orange slices for taste - for diet sodas (artificial sweeteners will trick your body that you can eat sweets without getting calories and will lead you to overeating and weight gain in the long run) ?- do not skip breakfast or other meals (this will slow down the metabolism and will result in more weight gain over time)  ?- can try smoothies made from fruit and almond/rice milk in am instead of regular breakfast ?- can also try old-fashioned (not instant) oatmeal made with almond/rice milk in am ?- order the dressing on the side when eating salad at a restaurant (pour less than half of the dressing on the salad) ?- eat as little meat as possible ?- can try juicing, but should not forget that juicing will get rid of the fiber, so would alternate with eating raw veg./fruits or drinking smoothies ?- use as little oil as possible, even when using olive oil - can dress a salad with a mix of balsamic vinegar and lemon juice, for e.g. ?- use agave nectar, stevia sugar, or regular sugar rather than artificial sweateners ?- steam or broil/roast veggies  ?- snack on veggies/fruit/nuts (unsalted, preferably) when possible, rather than processed foods ?- reduce or eliminate aspartame in diet (it is in diet sodas, chewing gum, etc) ?Read the labels! ? ?Try to read Dr. Janene Harvey book: "Program for Reversing Diabetes" for other ideas for healthy eating. ? ? ?  ? ?

## 2021-09-10 ENCOUNTER — Other Ambulatory Visit (HOSPITAL_COMMUNITY): Payer: Self-pay

## 2021-09-13 ENCOUNTER — Encounter: Payer: Self-pay | Admitting: Internal Medicine

## 2021-09-13 ENCOUNTER — Ambulatory Visit: Payer: Self-pay | Admitting: Pharmacist

## 2021-09-13 ENCOUNTER — Telehealth: Payer: Self-pay | Admitting: Pharmacist

## 2021-09-13 ENCOUNTER — Telehealth: Payer: Self-pay | Admitting: Internal Medicine

## 2021-09-13 DIAGNOSIS — Z794 Long term (current) use of insulin: Secondary | ICD-10-CM

## 2021-09-13 DIAGNOSIS — E1165 Type 2 diabetes mellitus with hyperglycemia: Secondary | ICD-10-CM

## 2021-09-13 MED ORDER — FREESTYLE LIBRE 2 SENSOR MISC: 3 refills | Status: DC

## 2021-09-13 NOTE — Telephone Encounter (Signed)
Pt need refill on libre 2 sent to [facility]. Pt need it by tomorrow ?

## 2021-09-13 NOTE — Chronic Care Management (AMB) (Signed)
?  Chronic Care Management  ? ?Note ? ?09/13/2021 ?Name: Haley Young MRN: [id number] DOB: 1950-08-23 ? ? ? ?Closing pharmacy CCM case at this time. Patient has clinic contact information for future questions or concerns. Continue to follow up with RN CM and endocrinology.  ? ?Catie Darnelle Maffucci, PharmD, [place], CPP ?Clinical Pharmacist ?Therapist, music at [employer] ?[phone] ? ?

## 2021-09-13 NOTE — Telephone Encounter (Signed)
Error

## 2021-09-13 NOTE — Telephone Encounter (Signed)
Refill sent.

## 2021-09-18 ENCOUNTER — Other Ambulatory Visit: Payer: Self-pay | Admitting: Internal Medicine

## 2021-09-21 ENCOUNTER — Telehealth: Payer: HMO

## 2021-09-24 ENCOUNTER — Ambulatory Visit
Admission: RE | Admit: 2021-09-24 | Discharge: 2021-09-24 | Disposition: A | Discharge status: Home / Self Care | Payer: Medicare HMO | Source: Ambulatory Visit | Attending: Internal Medicine | Admitting: Internal Medicine

## 2021-09-24 ENCOUNTER — Other Ambulatory Visit: Payer: Self-pay

## 2021-09-24 DIAGNOSIS — Z1231 Encounter for screening mammogram for malignant neoplasm of breast: Secondary | ICD-10-CM | POA: Insufficient documentation

## 2021-10-01 ENCOUNTER — Telehealth: Payer: Self-pay | Admitting: *Deleted

## 2021-10-01 ENCOUNTER — Telehealth: Payer: HMO

## 2021-10-01 NOTE — Telephone Encounter (Signed)
?  Care Management  ? ?Follow Up Note ? ? ?10/01/2021 ?Name: Haley Young MRN: [id number] DOB: 1951/01/20 ? ? ?Referred by: [name], MD ?Reason for referral : Chronic Care Management (DM, CHF) ? ? ?An unsuccessful telephone outreach was attempted today. The patient was referred to the case management team for assistance with care management and care coordination.  ? ?Follow Up Plan: RNCM will seek assistance from Care Guides in rescheduling appointment within the next 30 days. ? ?Hubert Azure RN, MSN ?RN Care Management Coordinator ?[place] ?[phone] ?[email or web address] ? ?

## 2021-10-07 ENCOUNTER — Encounter: Payer: Self-pay | Admitting: Internal Medicine

## 2021-10-07 ENCOUNTER — Other Ambulatory Visit (HOSPITAL_COMMUNITY): Payer: Self-pay

## 2021-10-08 ENCOUNTER — Other Ambulatory Visit (HOSPITAL_COMMUNITY): Payer: Self-pay

## 2021-10-08 DIAGNOSIS — Z794 Long term (current) use of insulin: Secondary | ICD-10-CM

## 2021-10-08 DIAGNOSIS — E1165 Type 2 diabetes mellitus with hyperglycemia: Secondary | ICD-10-CM

## 2021-10-08 DIAGNOSIS — I110 Hypertensive heart disease with heart failure: Secondary | ICD-10-CM

## 2021-10-08 NOTE — Telephone Encounter (Signed)
Pt contacted office to advise Ozempic is too expensive. Pt was advised patient assistance is an option and an application has been mailed to pt to complete and send back. ?

## 2021-10-12 ENCOUNTER — Other Ambulatory Visit: Payer: Self-pay | Admitting: Internal Medicine

## 2021-10-12 DIAGNOSIS — Z794 Long term (current) use of insulin: Secondary | ICD-10-CM

## 2021-10-14 ENCOUNTER — Encounter: Payer: Self-pay | Admitting: Internal Medicine

## 2021-10-14 DIAGNOSIS — E1165 Type 2 diabetes mellitus with hyperglycemia: Secondary | ICD-10-CM

## 2021-10-15 ENCOUNTER — Other Ambulatory Visit (HOSPITAL_COMMUNITY): Payer: Self-pay

## 2021-10-19 MED ORDER — FREESTYLE LIBRE 2 SENSOR MISC: 3 refills | Status: DC

## 2021-10-19 MED ORDER — FREESTYLE LIBRE 2 READER DEVI: 1 refills | Status: DC

## 2021-11-01 ENCOUNTER — Ambulatory Visit (INDEPENDENT_AMBULATORY_CARE_PROVIDER_SITE_OTHER): Payer: Medicare HMO | Admitting: *Deleted

## 2021-11-01 DIAGNOSIS — I5022 Chronic systolic (congestive) heart failure: Secondary | ICD-10-CM

## 2021-11-01 DIAGNOSIS — E1165 Type 2 diabetes mellitus with hyperglycemia: Secondary | ICD-10-CM

## 2021-11-01 NOTE — Patient Instructions (Addendum)
Visit Information ? ?Thank you for taking time to visit with me today. Please don't hesitate to contact me if I can be of assistance to you before our next scheduled telephone appointment. ? ?Following are the goals we discussed today:  ?Take medications as prescribed   ?Attend all scheduled provider appointments ?Call provider office for new concerns or questions  ?call office if I gain more than 2 pounds in one day or 5 pounds in one week ?keep legs up while sitting ?weigh myself daily ?bring diary to all appointments ?develop a rescue plan ?follow rescue plan if symptoms flare-up ?know when to call the doctorbased on weight and action plan ?check blood sugar at prescribed times: three times daily and when you have symptoms of low or high blood sugar ?enter blood sugar readings and medication or insulin into daily log ?take the blood sugar log to all doctor visits ?take the blood sugar meter to all doctor visits ?keep feet up while sitting ?check blood pressure 3 times per week ?keep a blood pressure log ?take blood pressure log to all doctor appointments ?develop an action plan for high blood pressure ?keep all doctor appointments ?Contact provider for shortness of breath ?Make sure you eat bedtime snack and do not skip meals throughout the day; may drink Glucerna ? ?Our next appointment is by telephone on 5/24 at 1300 ? ?Please call the care guide team at [phone] if you need to cancel or reschedule your appointment.  ? ?If you are experiencing a Mental Health or [place] or need someone to talk to, please call the Suicide and Crisis Lifeline: 988 ?call the [place] National Suicide Prevention Lifeline: 236-854-5014 or TTY: 585-813-0695 TTY 281 884 8228) to talk to a trained counselor ?call 1-800-273-TALK (toll free, 24 hour hotline)  ? ?Patient verbalizes understanding of instructions and care plan provided today and agrees to view in [place]. Active MyChart status confirmed with patient.    ? ?Hubert Azure RN, MSN ?RN Care Management Coordinator ?[place] ?[phone] ?[email or web address] ? ?

## 2021-11-02 NOTE — Chronic Care Management (AMB) (Signed)
?Chronic Care Management  ? ?CCM RN Visit Note ? ?11/02/2021 ?Name: Haley Young MRN: [id number] DOB: 10/02/1950 ? ?Subjective: ?Haley Young is a 71 y.o. year old female who is a primary care patient of Tullo, Aris Everts, MD. The care management team was consulted for assistance with disease management and care coordination needs.   ? ?Engaged with patient by telephone for follow up visit in response to provider referral for case management and/or care coordination services.  ? ?Consent to Services:  ?The patient was given information about Chronic Care Management services, agreed to services, and gave verbal consent prior to initiation of services.  Please see initial visit note for detailed documentation.  ? ?Patient agreed to services and verbal consent obtained.  ? ?Assessment: Review of patient past medical history, allergies, medications, health status, including review of consultants reports, laboratory and other test data, was performed as part of comprehensive evaluation and provision of chronic care management services.  ? ?SDOH (Social Determinants of Health) assessments and interventions performed:   ? ?CCM Care Plan ? ?Allergies  ?Allergen Reactions  ? Oxycodone Hcl   ?  REACTION: hallucinations  ? Albuterol Anxiety  ?  Other reaction(s): Other (See Comments) ?Tremors   ? Oxycodone Hcl Other (See Comments)  ?  halucinations  ? ? ?Outpatient Encounter Medications as of 11/01/2021  ?Medication Sig Note  ? apixaban (ELIQUIS) 5 MG TABS tablet Take 1 tablet (5 mg total) by mouth 2 (two) times daily.   ? atorvastatin (LIPITOR) 80 MG tablet TAKE 1 TABLET BY MOUTH EVERY DAY   ? hydrocortisone (CORTEF) 10 MG tablet Take 5 mg by mouth 2 (two) times daily. 10 mg QAM, 5 PM   ? insulin aspart (FIASP FLEXTOUCH) 100 UNIT/ML FlexTouch Pen Inject 16 units with breakfast and lunch and 20 units with supper 08/18/2021: Reduce your novolog doses to 14,   14  and 22 FOR NOW  ?  ?If you still have low sugars after a dinner meal of  salad and/or meat and veggies,  then next time you have that meal reduce the insulin at dinner to 20 units   ?  ?Increase your Tyler Aas to 15 units   ?PER PROVIDER INSTRUCTIONS   ? JARDIANCE 25 MG TABS tablet TAKE 1 TABLET BY MOUTH DAILY BEFORE BREAKFAST.   ? Semaglutide, 2 MG/DOSE, (OZEMPIC, 2 MG/DOSE,) 8 MG/3ML SOPN Inject 2 mg as directed once a week.   ? TRESIBA FLEXTOUCH 200 UNIT/ML FlexTouch Pen INJECT 20 UNITS DAILY. TITRATE AS INSTRUCTED. MAX DAILY DOSE: 40 UNITS   ? Continuous Blood Gluc Receiver (FREESTYLE LIBRE 2 READER) DEVI Use to check glucose at least TID   ? Continuous Blood Gluc Sensor (FREESTYLE LIBRE 2 SENSOR) MISC USE TO CHECK GLUCOSE AT LEAST 3 TIMES DAILY   ? Fludrocortisone Acetate (FLORINEF PO) Take 0.1 mg by mouth daily.   ? FLUoxetine (PROZAC) 10 MG tablet TAKE 1 TABLET BY MOUTH EVERY DAY   ? furosemide (LASIX) 20 MG tablet Take 40 mg by mouth daily.   ? Glucagon (BAQSIMI ONE PACK) 3 MG/DOSE POWD Use 1 puff in one nare as needed for hypoglycemia   ? insulin aspart (FIASP FLEXTOUCH) 100 UNIT/ML FlexTouch Pen Inject 16 units with breakfast and lunch and 24 units with supper (Patient not taking: Reported on 11/01/2021)   ? levothyroxine (SYNTHROID) 100 MCG tablet TAKE 1 TABLET DAILY ON EMPTY STOMACH WITH A GLASS OF WATER AT LEAST 60 MINUTES BEFORE BREAKFAST.   ? metoprolol succinate (  TOPROL-XL) 25 MG 24 hr tablet Take 25 mg by mouth daily.   ? mirtazapine (REMERON) 30 MG tablet Take 1 tablet (30 mg total) by mouth at bedtime.   ? Probiotic Product (PROBIOTIC ADVANCED PO) Take 1 capsule by mouth daily.   ? telmisartan (MICARDIS) 20 MG tablet Take 20 mg by mouth daily.   ? traZODone (DESYREL) 150 MG tablet TAKE 1 TABLET BY MOUTH EVERYDAY AT BEDTIME   ? vitamin B-12 (CYANOCOBALAMIN) 500 MCG tablet Take 500 mcg by mouth daily.   ? Vitamin D, Ergocalciferol, (DRISDOL) 1.25 MG (50000 UNIT) CAPS capsule TAKE 1 CAPSULE BY MOUTH ONE TIME PER WEEK   ? ?No facility-administered encounter medications on  file as of 11/01/2021.  ? ? ?Patient Active Problem List  ? Diagnosis Date Noted  ? 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 Exertional dyspnea 05/04/2021  ? Elevated liver enzymes 05/04/2021  ? 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 Right hip pain 03/12/2021  ? CKD (chronic kidney disease) stage 3, GFR 30-59 ml/min (H[facility]) 01/16/2021  ? Chronic left SI joint pain 05/01/2019  ? Transaminitis 05/01/2019  ? Educated about COVID-19 virus infection 11/15/2018  ? Anemia 07/06/2018  ? Chronic atrial fibrillation ([place]) 05/25/2018  ? Neuropathic pain of hand 05/03/2018  ? History of cardiac arrest 05/03/2018  ? Cerebral infarction, watershed distribution, bilateral, remote, resolved 05/03/2018  ? Chronic systolic heart failure ([place]) 04/26/2018  ? Dysphagia, oropharyngeal 04/26/2018  ? Chronic constipation 04/26/2018  ? Adrenal insufficiency (Addison's disease) ([place]) 01/20/2018  ? Hospital discharge follow-up 01/20/2018  ? 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  ? CHB (complete heart block) ([place]) 08/17/2017  ? CKD stage 3 due to type 2 diabetes mellitus ([place]) 05/18/2017  ? Hyponatremia 11/01/2016  ? Drug-induced vitamin B12 deficiency anemia 04/16/2016  ? Fatigue 03/27/2015  ? Overweight (BMI 25.0-29.9) 10/07/2014  ? Encounter for preventive health examination 09/13/2013  ? Hyperlipidemia associated with type 2 diabetes mellitus ([place]) 07/25/2013  ? Insomnia 07/25/2013  ? Hypothyroidism 03/29/2012  ? Screening for breast cancer 03/29/2012  ? Screening for colon cancer 03/29/2012  ? Depression, recurrent ([place]) 08/13/2008  ? GERD 08/13/2008  ? Osteopenia 08/13/2008  ? COLONIC POLYPS, HX OF 08/13/2008  ? ? ?Conditions to be addressed/monitored:CHF, HTN, and DMII ? ?Care Plan : [facility]  General Plan of Care (Adult)  ?Updates made by Leona Singleton, RN since 11/02/2021 12:00 AM  ?  ? ?Problem: Knowledge deficit related to self care managemenr of  chronic  condition   ?Priority: Medium  ?  ? ?Long-Range Goal: Patient will work with CCM team to gain knowledge for better self care monitoring of chronic conditions   ?Start Date: 05/28/2021  ?Expected End Date: 05/28/2022  ?Recent Progress: Not on track  ?Priority: Medium  ?Note:   ?tCurrent Barriers:  ?Knowledge Deficits related to plan of care for management of CHF, HTN, and DMII  ?Chronic Disease Management support and education needs related to CHF, HTN, and DMII   ?2/8--Reports feeling well.  Denies any shortness of breath or swelling.  Does admit to frequent hypoglycemic episodes, especially early orings.  Mist higher blood glucose reading are just after eating ? ?3/1--Reports SOB on exertion.  Denies edema and reports feeling less SOB when taking increased dose of lasix.  Has had to change insurance companies to support her [place] providers.  Has scheduled consultation with Endocrinologist Dr. Cruzita Lederer on 3/2.  Has scheduled liver  biopsy for this month also.  Continues to have episodes of hypoglycemia and hyperglycemia, 68 last night, up to 100's after treatment and 115 fasting this morning.  Did not weigh this morning ? ?4/24--reports she is doing ok; awaiting CT scan results of liver due to enzymes still being elevated.  Reviewed glucose readings.  Patient having hypoglycemic episodes in afternoons around 4.  Discussed meal intake and need to eat small meals throughout the day increasing protein.  Fasting blood sugars have ranged 130-150's.  Weight 158 pounds.  Denies chest pain, SOB, swelling, or dizziness. ? ?RNCM Clinical Goal(s):  ?Patient will verbalize understanding of plan for management of CHF, HTN, and DMII as evidenced by beginning to monitor blood pressure and daily weight monitor ?verbalize basic understanding of CHF, HTN, and DMII disease process and self health management plan as evidenced by monitoring vitals ?demonstrate understanding of rationale for each prescribed medication as evidenced  by medication compliance    ?demonstrate improved health management independence as evidenced by decreasing Hgb A1C by 0.3 points in the next 90 days        through collaboration with RN Care manager, p

## 2021-11-04 ENCOUNTER — Ambulatory Visit: Payer: Medicare HMO | Admitting: Internal Medicine

## 2021-11-04 ENCOUNTER — Encounter: Payer: Self-pay | Admitting: Internal Medicine

## 2021-11-04 VITALS — BP 120/82 | HR 70 | Ht 61.0 in | Wt 168.8 lb

## 2021-11-04 DIAGNOSIS — E038 Other specified hypothyroidism: Secondary | ICD-10-CM

## 2021-11-04 DIAGNOSIS — E063 Autoimmune thyroiditis: Secondary | ICD-10-CM | POA: Diagnosis not present

## 2021-11-04 DIAGNOSIS — E271 Primary adrenocortical insufficiency: Secondary | ICD-10-CM | POA: Diagnosis not present

## 2021-11-04 DIAGNOSIS — Z794 Long term (current) use of insulin: Secondary | ICD-10-CM

## 2021-11-04 DIAGNOSIS — E1122 Type 2 diabetes mellitus with diabetic chronic kidney disease: Secondary | ICD-10-CM

## 2021-11-04 LAB — POCT GLYCOSYLATED HEMOGLOBIN (HGB A1C): Hemoglobin A1C: 7.3 % — AB (ref 4.0–5.6)

## 2021-11-04 LAB — TSH: TSH: 8.98 u[IU]/mL — ABNORMAL HIGH (ref 0.35–5.50)

## 2021-11-04 LAB — T4, FREE: Free T4: 0.94 ng/dL (ref 0.60–1.60)

## 2021-11-04 MED ORDER — FREESTYLE LIBRE 3 SENSOR MISC: 1.0000 | 3 refills | Status: AC

## 2021-11-04 NOTE — Patient Instructions (Addendum)
Please continue hydrocortisone 10 mg in am and 5 mg in pm. ?Continue Florinef 0.1 mg daily. ? ?Please continue 100 mcg Levothyroxine for now. Take the thyroid hormone every day, with water, at least 30 minutes before breakfast, separated by at least 4 hours from: ?- acid reflux medications ?- calcium ?- iron ?- multivitamins ? ?Please continue: ?- Jardiance 25 mg before breakfast ?- Ozempic 2 mg weekly ? ?Increase: ?- Tresiba 16 units daily ? ?Change: ?- FiAsp 16-12-16 units before each meal ? ?Please return in 3 months. ?

## 2021-11-04 NOTE — Progress Notes (Addendum)
Patient ID: Haley Young, female   DOB: [birth year], 71 y.o.   MRN: [id number] ? ?This visit occurred during the SARS-CoV-2 public health emergency.  Safety protocols were in place, including screening questions prior to the visit, additional usage of staff PPE, and extensive cleaning of exam room while observing appropriate contact time as indicated for disinfecting solutions.  ? ?HPI: ?Haley Young is a 71 y.o.-year-old female, referred by her PCP, Dr.Tullo, for management of insulin-dependent DM2, diagnosed in 2010, uncontrolled, with complications (remote watershed CVA, CKD).  In 07/2021, she returned after an absence of more than 3 years - with her husband.  Last visit 3 months ago. ? ?Interim history: ?No increased urination, blurry vision, nausea, chest pain. ?She still has SOB, but no chest pain. ? 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   ?Of note, she also had suspicion for autoimmune hepatitis - Lived Bx was not suggestive for this, though. ? ?However, reviewing her chart, I do not see clear evidence of autoimmune diabetes.  ?Adrenal antibodies are also possible not available for review. ? ?Insulin-dependent diabetes: ?Reviewed HbA1c levels: ?Lab Results  ?Component Value Date  ? HGBA1C 9.3 (H) 08/06/2021  ? HGBA1C 8.3 (H) 05/03/2021  ? HGBA1C 9.2 (H) 01/13/2021  ? HGBA1C 13.2 (H) 06/12/2020  ? HGBA1C 7.8 (H) 05/02/2019  ? HGBA1C 10.4 (H) 11/27/2018  ? HGBA1C 9.5 06/29/2018  ? HGBA1C 8.4 (H) 01/17/2018  ? HGBA1C 7.0 (H) 05/11/2017  ? HGBA1C 9.1 08/21/2015  ? HGBA1C 7.5 (H) 03/12/2015  ? HGBA1C 7.9 (H) 09/09/2014  ? HGBA1C 7.8 (H) 03/19/2014  ? HGBA1C 8.8 (H) 11/27/2013  ? HGBA1C 8.8 (H) 07/25/2013  ?05/11/2017: HbA1c calculated from fructosamine is 6.4%. ?09/26/2016: HbA1c calculated from fructosamine is slightly higher, at 7.0%. ?04/26/2016:  HbA1c calculated from fructosamine is 6.38%. ?01/25/2016: HbA1c calculated from fructosamine is 6.35% ?08/21/2015: HbA1c calculated from fructosamine is 6.88%  ?12/18/2014: HbA1c calculated from fructosamine is 6.88% ?09/10/2014: HbA1c calculated from fructosamine is 6.9% ?03/19/2014:  HbA1c calculated from fructosamine is 6.57% ? ?06/18/2020: C-peptide 1.21 (0.8-3.85) ? ?01/22/2019: C-peptide 1.8 (0.9-4.5), glucose 159 ?GAD 65 antibodies 0.01 (<  or = 0.02) ?Insulin antibodies 0 ?IA-2 antibodies 0 ?ZnT8 antibodies <15  ? ?05/15/2018: C-peptide 1.6, glucose not available ? ?02/13/2018: DHEA-S <2 ? ?01/10/2018: C-peptide 1.8 (0.9-4.5), glucose 159 ?GAD 65 antibodies 0 ?Insulin antibodies 0 ?IA-2 antibodies 0 ?ZnT8 antibodies <15  ? ?Pt is on a regimen of: ? - Jardiance 25 mg before breakfast ?- Tresiba 12 units daily ?- FiAsp 16-16-20 units before each meal ?- Ozempic 2 mg weekly ?She was previously on metformin and glipizide ER at our last visit in 2019. ?She was on Januvia and Alogliptin >> too expensive ?She was on Jardiance 25 mg daily >> started 01/2016 >> however, potassium increased >> stopped (but also costly) ?We started Glipizide as we had to stop Invokana >> tremors >> stopped ?She was on Invokana 300 mg daily >> stopped 2/2 high potassium ?She was on Invokana 300 mg daily - added 08/2013  - costs her 300$ >> at last visit I gave her a coupon for InvokaMet 2 tablets in am [phone] mg) daily in am and continued Metformin 1000 mg with dinner. She tells she can get the 3 mo supply for 207%$ ?Tried insulin Lantus and Novolog - ~2011 ?Tried Cinnamon and vinegar. ? ?Checks her sugars more than  4 times a day with her freestyle libre 2 CGM: ? ? ?Previously: ? ? ?Lowest sugar was 60s; she has hypoglycemia awareness at 75.  ?She does not a glucagon kit at home. No previous hypoglycemia admission.  ?Highest sugar was 300. No previous DKA admissions.   ? ?Pt's meals are: ?- Breakfast: cereal ?- Lunch: Sandwich, soup,  salad ?- Dinner: Salad, chicken, fish ?- Snacks: 2 ? ?- + Mild CKD, last BUN/creatinine:  ?Lab Results  ?Component Value Date  ? BUN 25 (H) 08/06/2021  ? BUN 16 06/02/2021  ? CREATININE 1.27 (H) 08/06/2021  ? CREATININE 1.18 06/02/2021  ?On telmisartan 20 mg daily. ? ?- + HL; last set of lipids: ?Lab Results  ?Component Value Date  ? CHOL 192 08/06/2021  ? HDL 51.30 08/06/2021  ? [place] 39 05/03/2021  ? LDLDIRECT 114.0 08/06/2021  ? TRIG 240.0 (H) 08/06/2021  ? CHOLHDL 4 08/06/2021  ?On Lipitor 80 mg daily. ? ?- last eye exam was in 12/2020. No DR. ? ?- no numbness and tingling in her R foot, but in L foot  -after her cardiac arrest in the past. ? ?Addison's disease: ?-Diagnosed in 2019 and 2020 -elevated ACTH, at 102 (15-66), undetectable aldosterone. ? ?She is on hydrocortisone 10 mg in a.m. and 5 mg in p.m. (moved from evening to 5 pm, after her nap) and Florinef 0.1 mg daily.  ? ?She did double the dose around the time when her daughter was sick, but did not have to get the medication parenterally recently. ? ?She has increase fatigue, headaches. ?She does not have weight loss, joint aches, AP. ?No salt craving.  ? ?She has a med alert bracelet mentioning Addison's disease.  ? ?Hashimoto's thyroiditis: ? ?She is on levothyroxine 100 mcg daily: ?- at night! >> moved to am ?- no calcium ?- no iron ?- no multivitamins ?- no PPIs ?- not on Biotin ? ?Reviewed TSH levels: ?Lab Results  ?Component Value Date  ? TSH 2.90 03/12/2021  ? TSH 4.112 03/20/2020  ? TSH 0.76 05/02/2019  ? TSH 0.29 (A) 08/30/2018  ? TSH 3.54 01/17/2018  ? TSH 6.36 (H) 05/10/2017  ? TSH 1.98 04/14/2016  ? TSH 0.37 01/25/2016  ? TSH 2.38 08/21/2015  ? TSH 6.32 (H) 03/12/2015  ? TSH 10.17 (H) 12/16/2014  ? TSH 7.08 (H) 09/09/2014  ? TSH 3.06 09/13/2013  ? TSH 4.82 06/28/2012  ? TSH 6.72 (H) 03/29/2012  ? TSH 7.23 (H) 08/24/2011  ? TSH 6.95 (H) 03/05/2010  ? TSH 7.76 (H) 12/08/2009  ? TSH 4.28 08/13/2008  ? ?No FH of thyroid cancer. No h/o  radiation tx to head or neck. ?No herbal supplements. No Biotin use. No recent steroids use.  ? ?She continues to see cardiology at [facility] (Dr. Leonides Schanz). She had a catheterization >> no blockages.  She has pulmonary valve insufficiency.  She also had a pacemaker implanted. ?She was now also diagnosed with CHF. ?She and her husband also described that she had an incident in which her heart stopped completely and she had to be resuscitated while traveling out of town after our last visit. ?She has vitamin B12 and vitamin D deficiency, also, chronic fatigue. ?She had celiac investigation (blood test) in 2020 and this was negative ? ?ROS: ?+ see HPI ? ?Past Medical History:  ?Diagnosis Date  ? Asthma   ? Chicken pox   ? Depression   ? Diabetes mellitus   ? Frequent UTI   ? GERD (gastroesophageal reflux  disease)   ? Heart disease   ? Heart murmur   ? Heart valve replaced   ? heart valve/pulmonary - replacement   ? Hx of colonic polyps   ? Hyperhidrosis   ? especially with hot flashes    ? Hyperlipidemia   ? Lipoma   ? R axilla   ? MRSA cellulitis   ? surgical wound infection  ? Osteoporosis   ? Pyelocystitis   ? as a child   ? Thyroid disease   ? Urinary incontinence   ? ?Past Surgical History:  ?Procedure Laterality Date  ? ABDOMINAL HYSTERECTOMY  1970  ? [place], 2005  ? pulmonary stenosis with valve replacement, Dr. Evelina Dun  ? PARTIAL HYSTERECTOMY  1970  ? PULMONARY VEIN STENOSIS REPAIR    ? VALVE REPLACEMENT    ? ?Social History  ? ?Socioeconomic History  ? Marital status: Married  ?  Spouse name: Not on file  ? Number of children: 2  ? Years of education: Not on file  ? Highest education level: Not on file  ?Occupational History  ? Occupation: Retired  ?  Employer: RETIRED  ? Occupation: Sales   ?Tobacco Use  ? Smoking status: Never  ? Smokeless tobacco: Never  ?Vaping Use  ? Vaping Use: Never used  ?Substance and Sexual Activity  ? Alcohol use: Yes  ?  Comment: occasional wine   ? Drug use: No  ? Sexual  activity: Yes  ?Other Topics Concern  ? Not on file  ?Social History Narrative  ? Divorced x 2  ?   ? Sister is pt here- Harlene Ramus  ?   ? Cares for 39 year old mother  ?   ? Cares for daughter with Down's syndrome

## 2021-11-07 DIAGNOSIS — E1165 Type 2 diabetes mellitus with hyperglycemia: Secondary | ICD-10-CM | POA: Diagnosis not present

## 2021-11-07 DIAGNOSIS — Z794 Long term (current) use of insulin: Secondary | ICD-10-CM | POA: Diagnosis not present

## 2021-11-07 DIAGNOSIS — I5022 Chronic systolic (congestive) heart failure: Secondary | ICD-10-CM | POA: Diagnosis not present

## 2021-11-11 LAB — 21-HYDROXYLASE ANTIBODIES: 21-Hydroxylase Antibodies: POSITIVE — AB

## 2021-11-12 ENCOUNTER — Other Ambulatory Visit (HOSPITAL_COMMUNITY): Payer: Self-pay

## 2021-11-15 ENCOUNTER — Other Ambulatory Visit (HOSPITAL_COMMUNITY): Payer: Self-pay

## 2021-11-16 ENCOUNTER — Encounter: Payer: Self-pay | Admitting: Internal Medicine

## 2021-11-16 ENCOUNTER — Other Ambulatory Visit (HOSPITAL_COMMUNITY): Payer: Self-pay

## 2021-11-17 ENCOUNTER — Other Ambulatory Visit (HOSPITAL_COMMUNITY): Payer: Self-pay

## 2021-12-01 ENCOUNTER — Encounter: Payer: Self-pay | Admitting: Internal Medicine

## 2021-12-01 ENCOUNTER — Ambulatory Visit (INDEPENDENT_AMBULATORY_CARE_PROVIDER_SITE_OTHER): Payer: Medicare HMO | Admitting: *Deleted

## 2021-12-01 DIAGNOSIS — I5022 Chronic systolic (congestive) heart failure: Secondary | ICD-10-CM

## 2021-12-01 DIAGNOSIS — E1165 Type 2 diabetes mellitus with hyperglycemia: Secondary | ICD-10-CM

## 2021-12-01 DIAGNOSIS — R0602 Shortness of breath: Secondary | ICD-10-CM

## 2021-12-01 NOTE — Chronic Care Management (AMB) (Signed)
Chronic Care Management   CCM RN Visit Note  12/01/2021 Name: Haley Young MRN: [id number] DOB: 01/22/1951  Subjective: Haley Young is a 71 y.o. year old female who is a primary care patient of Derrel Nip, Aris Everts, MD. The care management team was consulted for assistance with disease management and care coordination needs.    Engaged with patient by telephone for follow up visit in response to provider referral for case management and/or care coordination services.   Consent to Services:  The patient was given information about Chronic Care Management services, agreed to services, and gave verbal consent prior to initiation of services.  Please see initial visit note for detailed documentation.   Patient agreed to services and verbal consent obtained.   Assessment: Review of patient past medical history, allergies, medications, health status, including review of consultants reports, laboratory and other test data, was performed as part of comprehensive evaluation and provision of chronic care management services.   SDOH (Social Determinants of Health) assessments and interventions performed:    CCM Care Plan  Allergies  Allergen Reactions   Oxycodone Hcl     REACTION: hallucinations   Albuterol Anxiety    Other reaction(s): Other (See Comments) Tremors    Oxycodone Hcl Other (See Comments)    halucinations    Outpatient Encounter Medications as of 12/01/2021  Medication Sig Note   apixaban (ELIQUIS) 5 MG TABS tablet Take 1 tablet (5 mg total) by mouth 2 (two) times daily.    atorvastatin (LIPITOR) 80 MG tablet TAKE 1 TABLET BY MOUTH EVERY DAY    Continuous Blood Gluc Sensor (FREESTYLE LIBRE 3 SENSOR) MISC 1 each by Does not apply route every 14 (fourteen) days.    Fludrocortisone Acetate (FLORINEF PO) Take 0.1 mg by mouth daily.    FLUoxetine (PROZAC) 10 MG tablet TAKE 1 TABLET BY MOUTH EVERY DAY    furosemide (LASIX) 20 MG tablet Take 40 mg by mouth daily.    Glucagon (BAQSIMI  ONE PACK) 3 MG/DOSE POWD Use 1 puff in one nare as needed for hypoglycemia    hydrocortisone (CORTEF) 10 MG tablet Take 5 mg by mouth 2 (two) times daily. 10 mg QAM, 5 PM    insulin aspart (FIASP FLEXTOUCH) 100 UNIT/ML FlexTouch Pen Inject 16 units with breakfast and lunch and 24 units with supper    insulin aspart (FIASP FLEXTOUCH) 100 UNIT/ML FlexTouch Pen Inject 16 units with breakfast and lunch and 20 units with supper 08/18/2021: Reduce your novolog doses to 14,   14  and 22 FOR NOW    If you still have low sugars after a dinner meal of salad and/or meat and veggies,  then next time you have that meal reduce the insulin at dinner to 20 units     Increase your Tresiba to 15 units   PER PROVIDER INSTRUCTIONS    JARDIANCE 25 MG TABS tablet TAKE 1 TABLET BY MOUTH DAILY BEFORE BREAKFAST.    levothyroxine (SYNTHROID) 100 MCG tablet TAKE 1 TABLET DAILY ON EMPTY STOMACH WITH A GLASS OF WATER AT LEAST 60 MINUTES BEFORE BREAKFAST.    metoprolol succinate (TOPROL-XL) 25 MG 24 hr tablet Take 25 mg by mouth daily.    mirtazapine (REMERON) 30 MG tablet Take 1 tablet (30 mg total) by mouth at bedtime.    Probiotic Product (PROBIOTIC ADVANCED PO) Take 1 capsule by mouth daily.    Semaglutide, 2 MG/DOSE, (OZEMPIC, 2 MG/DOSE,) 8 MG/3ML SOPN Inject 2 mg as directed once a week.  telmisartan (MICARDIS) 20 MG tablet Take 20 mg by mouth daily.    traZODone (DESYREL) 150 MG tablet TAKE 1 TABLET BY MOUTH EVERYDAY AT BEDTIME    TRESIBA FLEXTOUCH 200 UNIT/ML FlexTouch Pen INJECT 20 UNITS DAILY. TITRATE AS INSTRUCTED. MAX DAILY DOSE: 40 UNITS    vitamin B-12 (CYANOCOBALAMIN) 500 MCG tablet Take 500 mcg by mouth daily.    Vitamin D, Ergocalciferol, (DRISDOL) 1.25 MG (50000 UNIT) CAPS capsule TAKE 1 CAPSULE BY MOUTH ONE TIME PER WEEK    No facility-administered encounter medications on file as of 12/01/2021.    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   Right hip pain 03/12/2021   CKD (chronic kidney disease) stage 3, GFR 30-59 ml/min (HCC) 01/16/2021   Chronic left SI joint pain 05/01/2019   Transaminitis 05/01/2019   Educated about COVID-19 virus infection 11/15/2018   Anemia 07/06/2018   Chronic atrial fibrillation ([place]) 05/25/2018   Neuropathic pain of hand 05/03/2018   History of cardiac arrest 05/03/2018   Cerebral infarction, watershed distribution, bilateral, remote, resolved 76/22/6333   Chronic systolic heart failure ([place]) 04/26/2018   Dysphagia, oropharyngeal 04/26/2018   Chronic constipation 04/26/2018   Adrenal insufficiency (Addison's disease) ([place]) 01/20/2018   Hospital discharge follow-up 01/20/2018   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   CHB (complete heart block) ([place]) 08/17/2017   Type 2 diabetes mellitus with chronic kidney disease, with long-term current use of insulin ([place]) 05/18/2017   Hyponatremia 11/01/2016   Drug-induced vitamin B12 deficiency anemia 04/16/2016   Fatigue 03/27/2015   Overweight (BMI 25.0-29.9) 10/07/2014   Encounter for preventive health examination 09/13/2013   Hyperlipidemia associated with type 2 diabetes mellitus ([place]) 07/25/2013   Insomnia 07/25/2013   Hypothyroidism 03/29/2012   Screening for breast cancer 03/29/2012   Screening for colon cancer 03/29/2012   Depression, recurrent ([place]) 08/13/2008   GERD 08/13/2008   Osteopenia 08/13/2008   COLONIC POLYPS, HX OF 08/13/2008    Conditions to be addressed/monitored:CHF, HTN, and DMII  Care Plan : [place] (Adult)  Updates made by Leona Singleton, RN since 12/01/2021 12:00 AM     Problem: Knowledge deficit related to self care managemenr of chronic  condition   Priority: Medium     Long-Range Goal: Patient will  work with CCM team to gain knowledge for better self care monitoring of chronic conditions   Start Date: 05/28/2021  Expected End Date: 05/28/2022  Recent Progress: Not on track  Priority: Medium  Note:   Current Barriers:  Knowledge Deficits related to plan of care for management of CHF, HTN, and DMII  Chronic Disease Management support and education needs related to CHF, HTN, and DMII   2/8--Reports feeling well.  Denies any shortness of breath or swelling.  Does admit to frequent hypoglycemic episodes, especially early orings.  Mist higher blood glucose reading are just after eating  3/1--Reports SOB on exertion.  Denies edema and reports feeling less SOB when taking increased dose of lasix.  Has had to change insurance companies to support her [place] providers.  Has scheduled consultation with Endocrinologist Dr. Cruzita Lederer on 3/2.  Has scheduled liver biopsy for this month also.  Continues to have episodes of hypoglycemia and hyperglycemia, 68 last night, up to 100's after  treatment and 115 fasting this morning.  Did not weigh this morning  4/24--reports she is doing ok; awaiting CT scan results of liver due to enzymes still being elevated.  Reviewed glucose readings.  Patient having hypoglycemic episodes in afternoons around 4.  Discussed meal intake and need to eat small meals throughout the day increasing protein.  Fasting blood sugars have ranged 130-150's.  Weight 158 pounds.  Denies chest pain, SOB, swelling, or dizziness.  5/24--Reports sob with minimal exersion, swelling in feet, increase in weight 165 pounds (about 10 pounds); attended cardiology appointment and states she was told it was not her heart but she could increase her lasix to 60 mg daily (has not done) and to start checking BPs (has not done).  Continues to have trouble sleeping while taking trazadone, benadryl, and melaatonin,  fasting nlood sugar was 170 with ranges of 54-200's; continues to have periods of hypoglycemia.   Awaiting pulmonology consult  RNCM Clinical Goal(s):  Patient will verbalize understanding of plan for management of CHF, HTN, and DMII as evidenced by beginning to monitor blood pressure and daily weight monitor verbalize basic understanding of CHF, HTN, and DMII disease process and self health management plan as evidenced by monitoring vitals demonstrate understanding of rationale for each prescribed medication as evidenced by medication compliance    demonstrate improved health management independence as evidenced by decreasing Hgb A1C by 0.3 points in the next 90 days        through collaboration with RN Care manager, provider, and care team.   Interventions: 1:1 collaboration with primary care provider regarding development and update of comprehensive plan of care as evidenced by provider attestation and co-signature Inter-disciplinary care team collaboration (see longitudinal plan of care) Evaluation of current treatment plan related to  self management and patient's adherence to plan as established by provider   Heart Failure Interventions:  (Status: Goal on track: NO.)  Long Term Goal  Basic overview and discussion of pathophysiology of Heart Failure reviewed Reviewed Heart Failure Action Plan in depth and provided written copy Advised patient to weigh each morning after emptying bladder Discussed importance of daily weight and advised patient to weigh and record daily Reviewed role of diuretics in prevention of fluid overload and management of heart failure Discussed the importance of keeping all appointments with provider Instructed and encouraged increase in lasix dose Confirmed patient has BP cuff at home and encouraged monitoring  Diabetes:  (Status: Goal on track: NO.) Long Term Goal   Lab Results  Component Value Date   HGBA1C 9.3 (H) 08/06/2021  Assessed patient's understanding of A1c goal: <7% Provided education to patient about basic DM disease process; Reviewed  medications with patient and discussed importance of medication adherence;        Reviewed prescribed diet with patient low salt carb modified; Counseled on importance of regular laboratory monitoring as prescribed;        Discussed plans with patient for ongoing care management follow up and provided patient with direct contact information for care management team;      Provided patient with written educational materials related to hypo and hyperglycemia and importance of correct treatment;       Advised patient, providing education and rationale, to check cbg three times daily and record        Discussed hypoglycemia and ways to prevent/reduce episodes Sending diabetic diet education Instructed and encouraged bedtime snack and not to skip meals Emotional support and empathy provided to patient  Hypertension: (Status:  Condition stable. Not addressed this visit. Goal on track: NO.)  Long Term Goal Last practice recorded BP readings:  BP Readings from Last 3 Encounters:  09/09/21 120/72  08/11/21 110/68  08/02/21 119/72  Most recent eGFR/CrCl: No results found for: EGFR  No components found for: CRCL  Evaluation of current treatment plan related to hypertension self management and patient's adherence to plan as established by provider;   Reviewed medications with patient and discussed importance of compliance;  Discussed plans with patient for ongoing care management follow up and provided patient with direct contact information for care management team; Advised patient, providing education and rationale, to monitor blood pressure daily and record, calling PCP for findings outside established parameters;  Discussed complications of poorly controlled blood pressure such as heart disease, stroke, circulatory complications, vision complications, kidney impairment, sexual dysfunction;   Patient Goals/Self-Care Activities: Take medications as prescribed   Attend all scheduled provider  appointments Call provider office for new concerns or questions  call office if I gain more than 2 pounds in one day or 5 pounds in one week keep legs up while sitting weigh myself daily bring diary to all appointments develop a rescue plan follow rescue plan if symptoms flare-up know when to call the doctorbased on weight and action plan check blood sugar at prescribed times: three times daily and when you have symptoms of low or high blood sugar enter blood sugar readings and medication or insulin into daily log take the blood sugar log to all doctor visits take the blood sugar meter to all doctor visits keep feet up while sitting check blood pressure 3 times per week keep a blood pressure log take blood pressure log to all doctor appointments develop an action plan for high blood pressure keep all doctor appointments Contact provider for shortness of breath Make sure you eat bedtime snack and do not skip meals throughout the day; may drink Glucerna       Plan:The care management team will reach out to the patient again over the next 20 days.  Hubert Azure RN, MSN RN Care Management Coordinator [place] [phone] [email or web address]

## 2021-12-01 NOTE — Telephone Encounter (Signed)
I have pended the referral for approval.  

## 2021-12-01 NOTE — Patient Instructions (Addendum)
Visit Information  Thank you for taking time to visit with me today. Please don't hesitate to contact me if I can be of assistance to you before our next scheduled telephone appointment.  Following are the goals we discussed today:  Take medications as prescribed   Attend all scheduled provider appointments Call provider office for new concerns or questions  call office if I gain more than 2 pounds in one day or 5 pounds in one week keep legs up while sitting weigh myself daily bring diary to all appointments develop a rescue plan follow rescue plan if symptoms flare-up know when to call the doctorbased on weight and action plan check blood sugar at prescribed times: three times daily and when you have symptoms of low or high blood sugar enter blood sugar readings and medication or insulin into daily log take the blood sugar log to all doctor visits take the blood sugar meter to all doctor visits keep feet up while sitting check blood pressure 3 times per week keep a blood pressure log take blood pressure log to all doctor appointments develop an action plan for high blood pressure keep all doctor appointments Contact provider for shortness of breath Make sure you eat bedtime snack and do not skip meals throughout the day; may drink Glucerna   Our next appointment is by telephone on 6/7 at 1400  Please call the care guide team at [phone] if you need to cancel or reschedule your appointment.   If you are experiencing a Mental Health or [place] or need someone to talk to, please call the Suicide and Crisis Lifeline: 988 call the [place] National Suicide Prevention Lifeline: 682-082-5074 or TTY: (413)422-7987 TTY (703) 546-9842) to talk to a trained counselor call 1-800-273-TALK (toll free, 24 hour hotline) call 911   Patient verbalizes understanding of instructions and care plan provided today and agrees to view in [place]. Active MyChart status and patient  understanding of how to access instructions and care plan via MyChart confirmed with patient.     Hubert Azure RN, MSN RN Care Management Coordinator [place] [phone] [email or web address]

## 2021-12-07 NOTE — Telephone Encounter (Signed)
Pt called stating she can not get in to [facility] until august. Pt want to get a pulmonologist doctor at [facility]

## 2021-12-08 DIAGNOSIS — Z794 Long term (current) use of insulin: Secondary | ICD-10-CM

## 2021-12-08 DIAGNOSIS — E1169 Type 2 diabetes mellitus with other specified complication: Secondary | ICD-10-CM

## 2021-12-08 DIAGNOSIS — I110 Hypertensive heart disease with heart failure: Secondary | ICD-10-CM | POA: Diagnosis not present

## 2021-12-08 DIAGNOSIS — I509 Heart failure, unspecified: Secondary | ICD-10-CM

## 2021-12-08 NOTE — Telephone Encounter (Signed)
I have pended referral to [facility] for approval.

## 2021-12-08 NOTE — Addendum Note (Signed)
Addended by: Adair Laundry on: 12/08/2021 12:12 PM   Modules accepted: Orders

## 2021-12-08 NOTE — Addendum Note (Signed)
Addended by: [name] on: 12/08/2021 12:48 PM   Modules accepted: Orders

## 2021-12-14 ENCOUNTER — Ambulatory Visit: Payer: Medicare HMO | Admitting: Internal Medicine

## 2021-12-15 ENCOUNTER — Telehealth: Payer: Self-pay | Admitting: *Deleted

## 2021-12-15 ENCOUNTER — Telehealth: Payer: Medicare HMO

## 2021-12-15 NOTE — Telephone Encounter (Signed)
  Care Management   Follow Up Note   12/15/2021 Name: Haley Young MRN: [id number] DOB: Mar 23, 1951   Referred by: [name], MD Reason for referral : Chronic Care Management (DM, CHF)   An unsuccessful telephone outreach was attempted today. The patient was referred to the case management team for assistance with care management and care coordination.   Follow Up Plan: The care management team will reach out to the patient again over the next 20 days.   Hubert Azure RN, MSN RN Care Management Coordinator [place] [phone] [email or web address]

## 2021-12-22 ENCOUNTER — Ambulatory Visit (INDEPENDENT_AMBULATORY_CARE_PROVIDER_SITE_OTHER): Payer: Medicare HMO | Admitting: *Deleted

## 2021-12-22 DIAGNOSIS — I5022 Chronic systolic (congestive) heart failure: Secondary | ICD-10-CM

## 2021-12-22 DIAGNOSIS — Z794 Long term (current) use of insulin: Secondary | ICD-10-CM

## 2021-12-22 DIAGNOSIS — R0602 Shortness of breath: Secondary | ICD-10-CM

## 2021-12-23 ENCOUNTER — Other Ambulatory Visit: Payer: Self-pay | Admitting: Internal Medicine

## 2021-12-23 ENCOUNTER — Other Ambulatory Visit (HOSPITAL_COMMUNITY): Payer: Self-pay

## 2021-12-23 DIAGNOSIS — Z794 Long term (current) use of insulin: Secondary | ICD-10-CM

## 2021-12-23 NOTE — Patient Instructions (Addendum)
Visit Information  Thank you for taking time to visit with me today. Please don't hesitate to contact me if I can be of assistance to you before our next scheduled telephone appointment.  Following are the goals we discussed today:  Take medications as prescribed   Attend all scheduled provider appointments Call provider office for new concerns or questions  call office if I gain more than 2 pounds in one day or 5 pounds in one week keep legs up while sitting weigh myself daily bring diary to all appointments develop a rescue plan follow rescue plan if symptoms flare-up know when to call the doctorbased on weight and action plan check blood sugar at prescribed times: three times daily and when you have symptoms of low or high blood sugar enter blood sugar readings and medication or insulin into daily log take the blood sugar log to all doctor visits take the blood sugar meter to all doctor visits keep feet up while sitting check blood pressure 3 times per week keep a blood pressure log take blood pressure log to all doctor appointments develop an action plan for high blood pressure keep all doctor appointments Contact provider for shortness of breath Make sure you eat bedtime snack and do not skip meals throughout the day; may drink Glucerna  Our next appointment is by telephone on 6/30 at 1030  Please call the care guide team at [phone] if you need to cancel or reschedule your appointment.   If you are experiencing a Mental Health or [place] or need someone to talk to, please call the Suicide and Crisis Lifeline: 988 call the [place] National Suicide Prevention Lifeline: (952)765-0699 or TTY: (414) 808-4007 TTY (671)564-6337) to talk to a trained counselor call 1-800-273-TALK (toll free, 24 hour hotline) call 911   Patient verbalizes understanding of instructions and care plan provided today and agrees to view in [place]. Active MyChart status and patient  understanding of how to access instructions and care plan via MyChart confirmed with patient.     Hubert Azure RN, MSN RN Care Management Coordinator [place] [phone] [email or web address]

## 2021-12-23 NOTE — Chronic Care Management (AMB) (Cosign Needed)
Chronic Care Management   CCM RN Visit Note  12/23/2021 Name: Haley Young MRN: [id number] DOB: 08/19/1950  Subjective: Haley Young is a 71 y.o. year old female who is a primary care patient of Derrel Nip, Aris Everts, MD. The care management team was consulted for assistance with disease management and care coordination needs.    Engaged with patient by telephone for follow up visit in response to provider referral for case management and/or care coordination services.   Consent to Services:  The patient was given information about Chronic Care Management services, agreed to services, and gave verbal consent prior to initiation of services.  Please see initial visit note for detailed documentation.   Patient agreed to services and verbal consent obtained.   Assessment: Review of patient past medical history, allergies, medications, health status, including review of consultants reports, laboratory and other test data, was performed as part of comprehensive evaluation and provision of chronic care management services.   SDOH (Social Determinants of Health) assessments and interventions performed:    CCM Care Plan  Allergies  Allergen Reactions   Oxycodone Hcl     REACTION: hallucinations   Albuterol Anxiety    Other reaction(s): Other (See Comments) Tremors    Oxycodone Hcl Other (See Comments)    halucinations    Outpatient Encounter Medications as of 12/22/2021  Medication Sig Note   apixaban (ELIQUIS) 5 MG TABS tablet Take 1 tablet (5 mg total) by mouth 2 (two) times daily.    atorvastatin (LIPITOR) 80 MG tablet TAKE 1 TABLET BY MOUTH EVERY DAY    Continuous Blood Gluc Sensor (FREESTYLE LIBRE 3 SENSOR) MISC 1 each by Does not apply route every 14 (fourteen) days.    Fludrocortisone Acetate (FLORINEF PO) Take 0.1 mg by mouth daily.    FLUoxetine (PROZAC) 10 MG tablet TAKE 1 TABLET BY MOUTH EVERY DAY    furosemide (LASIX) 20 MG tablet Take 40 mg by mouth daily.    Glucagon (BAQSIMI  ONE PACK) 3 MG/DOSE POWD Use 1 puff in one nare as needed for hypoglycemia    hydrocortisone (CORTEF) 10 MG tablet Take 5 mg by mouth 2 (two) times daily. 10 mg QAM, 5 PM    insulin aspart (FIASP FLEXTOUCH) 100 UNIT/ML FlexTouch Pen Inject 16 units with breakfast and lunch and 24 units with supper    insulin aspart (FIASP FLEXTOUCH) 100 UNIT/ML FlexTouch Pen Inject 16 units with breakfast and lunch and 20 units with supper 08/18/2021: Reduce your novolog doses to 14,   14  and 22 FOR NOW    If you still have low sugars after a dinner meal of salad and/or meat and veggies,  then next time you have that meal reduce the insulin at dinner to 20 units     Increase your Tresiba to 15 units   PER PROVIDER INSTRUCTIONS    JARDIANCE 25 MG TABS tablet TAKE 1 TABLET BY MOUTH DAILY BEFORE BREAKFAST.    levothyroxine (SYNTHROID) 100 MCG tablet TAKE 1 TABLET DAILY ON EMPTY STOMACH WITH A GLASS OF WATER AT LEAST 60 MINUTES BEFORE BREAKFAST.    metoprolol succinate (TOPROL-XL) 25 MG 24 hr tablet Take 25 mg by mouth daily.    mirtazapine (REMERON) 30 MG tablet Take 1 tablet (30 mg total) by mouth at bedtime.    Probiotic Product (PROBIOTIC ADVANCED PO) Take 1 capsule by mouth daily.    Semaglutide, 2 MG/DOSE, (OZEMPIC, 2 MG/DOSE,) 8 MG/3ML SOPN Inject 2 mg as directed once a week.  telmisartan (MICARDIS) 20 MG tablet Take 20 mg by mouth daily.    traZODone (DESYREL) 150 MG tablet TAKE 1 TABLET BY MOUTH EVERYDAY AT BEDTIME    TRESIBA FLEXTOUCH 200 UNIT/ML FlexTouch Pen INJECT 20 UNITS DAILY. TITRATE AS INSTRUCTED. MAX DAILY DOSE: 40 UNITS    vitamin B-12 (CYANOCOBALAMIN) 500 MCG tablet Take 500 mcg by mouth daily.    Vitamin D, Ergocalciferol, (DRISDOL) 1.25 MG (50000 UNIT) CAPS capsule TAKE 1 CAPSULE BY MOUTH ONE TIME PER WEEK    No facility-administered encounter medications on file as of 12/22/2021.    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   Right hip pain 03/12/2021   CKD (chronic kidney disease) stage 3, GFR 30-59 ml/min (HCC) 01/16/2021   Chronic left SI joint pain 05/01/2019   Transaminitis 05/01/2019   Educated about COVID-19 virus infection 11/15/2018   Anemia 07/06/2018   Chronic atrial fibrillation ([place]) 05/25/2018   Neuropathic pain of hand 05/03/2018   History of cardiac arrest 05/03/2018   Cerebral infarction, watershed distribution, bilateral, remote, resolved [id number]/85/[id number]   Chronic systolic heart failure ([place]) 04/26/2018   Dysphagia, oropharyngeal 04/26/2018   Chronic constipation 04/26/2018   Adrenal insufficiency (Addison's disease) ([place]) 01/20/2018   Hospital discharge follow-up 01/20/2018   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   CHB (complete heart block) ([place]) 08/17/2017   Type 2 diabetes mellitus with chronic kidney disease, with long-term current use of insulin ([place]) 05/18/2017   Hyponatremia 11/01/2016   Drug-induced vitamin B12 deficiency anemia 04/16/2016   Fatigue 03/27/2015   Overweight (BMI 25.0-29.9) 10/07/2014   Encounter for preventive health examination 09/13/2013   Hyperlipidemia associated with type 2 diabetes mellitus ([place]) 07/25/2013   Insomnia 07/25/2013   Hypothyroidism 03/29/2012   Screening for breast cancer 03/29/2012   Screening for colon cancer 03/29/2012   Depression, recurrent ([place]) 08/13/2008   GERD 08/13/2008   Osteopenia 08/13/2008   COLONIC POLYPS, HX OF 08/13/2008    Conditions to be addressed/monitored:Atrial Fibrillation, CHF, HTN, and DMII  Care Plan : [facility] (Adult)  Updates made by Leona Singleton, RN since 12/23/2021 12:00 AM     Problem: Knowledge deficit related to self care managemenr of chronic  condition   Priority: Medium     Long-Range  Goal: Patient will work with CCM team to gain knowledge for better self care monitoring of chronic conditions   Start Date: 05/28/2021  Expected End Date: 05/28/2022  Recent Progress: Not on track  Priority: Medium  Note:   Current Barriers:  Knowledge Deficits related to plan of care for management of CHF, HTN, and DMII  Chronic Disease Management support and education needs related to CHF, HTN, and DMII   2/8--Reports feeling well.  Denies any shortness of breath or swelling.  Does admit to frequent hypoglycemic episodes, especially early orings.  Mist higher blood glucose reading are just after eating  3/1--Reports SOB on exertion.  Denies edema and reports feeling less SOB when taking increased dose of lasix.  Has had to change insurance companies to support her [facility] providers.  Has scheduled consultation with Endocrinologist Dr. Cruzita Lederer on 3/2.  Has scheduled liver biopsy for this month also.  Continues to have episodes of hypoglycemia and hyperglycemia, 68 last night, up to  100's after treatment and 115 fasting this morning.  Did not weigh this morning  4/24--reports she is doing ok; awaiting CT scan results of liver due to enzymes still being elevated.  Reviewed glucose readings.  Patient having hypoglycemic episodes in afternoons around 4.  Discussed meal intake and need to eat small meals throughout the day increasing protein.  Fasting blood sugars have ranged 130-150's.  Weight 158 pounds.  Denies chest pain, SOB, swelling, or dizziness.  5/24--Reports sob with minimal exersion, swelling in feet, increase in weight 165 pounds (about 10 pounds); attended cardiology appointment and states she was told it was not her heart but she could increase her lasix to 60 mg daily (has not done) and to start checking BPs (has not done).  Continues to have trouble sleeping while taking trazadone, benadryl, and melaatonin,  fasting nlood sugar was 170 with ranges of 54-200's; continues to have periods of  hypoglycemia.  Awaiting pulmonology consult  6/14--Continues with SOB.  Was noted to be in atrial flutter by Cardiologist and scheduled for a DCCV on 6/28.  Still needs to increase dose of lasix.  Discussed how atrial flutter relates to heart failure, weight remain 165 pounds.  Still awaiting pulmonology consult  RNCM Clinical Goal(s):  Patient will verbalize understanding of plan for management of CHF, HTN, and DMII as evidenced by beginning to monitor blood pressure and daily weight monitor verbalize basic understanding of CHF, HTN, and DMII disease process and self health management plan as evidenced by monitoring vitals demonstrate understanding of rationale for each prescribed medication as evidenced by medication compliance    demonstrate improved health management independence as evidenced by decreasing Hgb A1C by 0.3 points in the next 90 days        through collaboration with RN Care manager, provider, and care team.   Interventions: 1:1 collaboration with primary care provider regarding development and update of comprehensive plan of care as evidenced by provider attestation and co-signature Inter-disciplinary care team collaboration (see longitudinal plan of care) Evaluation of current treatment plan related to  self management and patient's adherence to plan as established by provider   Heart Failure Interventions:  (Status: Goal on track: NO.)  Long Term Goal  Basic overview and discussion of pathophysiology of Heart Failure reviewed Reviewed Heart Failure Action Plan in depth and provided written copy Advised patient to weigh each morning after emptying bladder Discussed importance of daily weight and advised patient to weigh and record daily Reviewed role of diuretics in prevention of fluid overload and management of heart failure Discussed the importance of keeping all appointments with provider Instructed and encouraged increase in lasix dose Confirmed patient has BP cuff at  home and encouraged monitoring Sending education on atrial flutter and cardioversion  Diabetes:  (Status: Goal on Track (progressing): YES. Condition stable. Not addressed this visit.) Long Term Goal   Lab Results  Component Value Date   HGBA1C 7.3 (A) 11/04/2021  Assessed patient's understanding of A1c goal: <7% Provided education to patient about basic DM disease process; Reviewed medications with patient and discussed importance of medication adherence;        Reviewed prescribed diet with patient low salt carb modified; Counseled on importance of regular laboratory monitoring as prescribed;        Discussed plans with patient for ongoing care management follow up and provided patient with direct contact information for care management team;      Provided patient with written educational materials related to hypo and hyperglycemia  and importance of correct treatment;       Advised patient, providing education and rationale, to check cbg three times daily and record        Discussed hypoglycemia and ways to prevent/reduce episodes Sending diabetic diet education Instructed and encouraged bedtime snack and not to skip meals Emotional support and empathy provided to patient Congratulated on A1C reduction  Hypertension: (Status: Condition stable. Not addressed this visit. Goal on track: NO.)  Long Term Goal Last practice recorded BP readings:  BP Readings from Last 3 Encounters:  11/04/21 120/82  09/09/21 120/72  08/11/21 110/68  Most recent eGFR/CrCl: No results found for: "EGFR"  No components found for: "CRCL"  Evaluation of current treatment plan related to hypertension self management and patient's adherence to plan as established by provider;   Reviewed medications with patient and discussed importance of compliance;  Discussed plans with patient for ongoing care management follow up and provided patient with direct contact information for care management team; Advised patient,  providing education and rationale, to monitor blood pressure daily and record, calling PCP for findings outside established parameters;  Discussed complications of poorly controlled blood pressure such as heart disease, stroke, circulatory complications, vision complications, kidney impairment, sexual dysfunction;   Patient Goals/Self-Care Activities: Take medications as prescribed   Attend all scheduled provider appointments Call provider office for new concerns or questions  call office if I gain more than 2 pounds in one day or 5 pounds in one week keep legs up while sitting weigh myself daily bring diary to all appointments develop a rescue plan follow rescue plan if symptoms flare-up know when to call the doctorbased on weight and action plan check blood sugar at prescribed times: three times daily and when you have symptoms of low or high blood sugar enter blood sugar readings and medication or insulin into daily log take the blood sugar log to all doctor visits take the blood sugar meter to all doctor visits keep feet up while sitting check blood pressure 3 times per week keep a blood pressure log take blood pressure log to all doctor appointments develop an action plan for high blood pressure keep all doctor appointments Contact provider for shortness of breath Make sure you eat bedtime snack and do not skip meals throughout the day; may drink Glucerna       Plan:The care management team will reach out to the patient again over the next 45 days.  Hubert Azure RN, MSN RN Care Management Coordinator [place] [phone] [email or web address]

## 2021-12-24 ENCOUNTER — Other Ambulatory Visit (HOSPITAL_COMMUNITY): Payer: Self-pay

## 2021-12-24 MED ORDER — OZEMPIC (2 MG/DOSE) 8 MG/3ML ~~LOC~~ SOPN
2.0000 mg | PEN_INJECTOR | SUBCUTANEOUS | 2 refills | Status: AC
  Filled 2021-12-24: qty 3, 28d supply, fill #0
  Filled 2022-01-28: qty 3, 28d supply, fill #1

## 2021-12-27 ENCOUNTER — Other Ambulatory Visit (HOSPITAL_COMMUNITY): Payer: Self-pay

## 2022-01-04 ENCOUNTER — Encounter: Payer: Self-pay | Admitting: Internal Medicine

## 2022-01-05 ENCOUNTER — Ambulatory Visit (INDEPENDENT_AMBULATORY_CARE_PROVIDER_SITE_OTHER): Payer: Medicare HMO | Admitting: Internal Medicine

## 2022-01-05 ENCOUNTER — Encounter: Payer: Self-pay | Admitting: Internal Medicine

## 2022-01-05 VITALS — BP 114/70 | HR 84 | Temp 97.9°F | Ht 61.0 in | Wt 164.8 lb

## 2022-01-05 DIAGNOSIS — N1832 Chronic kidney disease, stage 3b: Secondary | ICD-10-CM

## 2022-01-05 DIAGNOSIS — I482 Chronic atrial fibrillation, unspecified: Secondary | ICD-10-CM

## 2022-01-05 DIAGNOSIS — E1122 Type 2 diabetes mellitus with diabetic chronic kidney disease: Secondary | ICD-10-CM | POA: Diagnosis not present

## 2022-01-05 DIAGNOSIS — Z794 Long term (current) use of insulin: Secondary | ICD-10-CM

## 2022-01-05 DIAGNOSIS — R0609 Other forms of dyspnea: Secondary | ICD-10-CM

## 2022-01-05 NOTE — Assessment & Plan Note (Signed)
Secondary to deconditioning due to  chronic conditions.  Reports wheezing with exertion.  No prior PFT's.   Albuterol used in previous trials was not tolerated,  and she has been referred to and is Seeing pulmonology next week for eval at [facility].  Cardiopulmonary rehab recommended

## 2022-01-05 NOTE — Progress Notes (Unsigned)
Subjective:  Patient ID: Haley Young, female    DOB: [birth year]  Age: 71 y.o. MRN: [id number]  CC: There were no encounter diagnoses.   HPI Haley Young presents for  Chief Complaint  Patient presents with   Insomnia    1) WIDE AWAKE AT BEDTIME . USING an OTC 50 mg benadryl product  and 150 mg trazodone.   Doesn't sleep until 2 am som nights.  Plays computer games at night,   bedtime in 9 pm   .  Up by 8  Melatonin trial 30 minutes pre bed   2) Fatigue, lack of energy .  Ablation scheduled for yesterday was cancelled bc she was in sinus rhythm  Wants pt for lack of endurance  3) DM:  out of range 65% of time. fat Outpatient Medications Prior to Visit  Medication Sig Dispense Refill   amiodarone (PACERONE) 200 MG tablet Take by mouth.     apixaban (ELIQUIS) 5 MG TABS tablet Take 1 tablet (5 mg total) by mouth 2 (two) times daily. 60 tablet 11   atorvastatin (LIPITOR) 80 MG tablet TAKE 1 TABLET BY MOUTH EVERY DAY 90 tablet 1   Continuous Blood Gluc Sensor (FREESTYLE LIBRE 3 SENSOR) MISC 1 each by Does not apply route every 14 (fourteen) days. 6 each 3   fludrocortisone (FLORINEF) 0.1 MG tablet Take 100 mcg by mouth daily.     FLUoxetine (PROZAC) 10 MG tablet TAKE 1 TABLET BY MOUTH EVERY DAY 90 tablet 1   furosemide (LASIX) 20 MG tablet Take 40 mg by mouth daily.     Glucagon (BAQSIMI ONE PACK) 3 MG/DOSE POWD Use 1 puff in one nare as needed for hypoglycemia 1 each PRN   hydrocortisone (CORTEF) 10 MG tablet Take 5 mg by mouth 2 (two) times daily. 10 mg QAM, 5 PM     insulin aspart (FIASP FLEXTOUCH) 100 UNIT/ML FlexTouch Pen Inject 16 units with breakfast and lunch and 24 units with supper 60 mL 2   insulin aspart (FIASP FLEXTOUCH) 100 UNIT/ML FlexTouch Pen Inject 16 units with breakfast and lunch and 20 units with supper 60 mL 2   JARDIANCE 25 MG TABS tablet TAKE 1 TABLET BY MOUTH DAILY BEFORE BREAKFAST. 90 tablet 3   levothyroxine (SYNTHROID) 100 MCG tablet TAKE 1 TABLET DAILY ON  EMPTY STOMACH WITH A GLASS OF WATER AT LEAST 60 MINUTES BEFORE BREAKFAST. 90 tablet 1   metoprolol succinate (TOPROL-XL) 25 MG 24 hr tablet Take 25 mg by mouth daily.     mirtazapine (REMERON) 30 MG tablet Take 1 tablet (30 mg total) by mouth at bedtime. 90 tablet 1   Probiotic Product (PROBIOTIC ADVANCED PO) Take 1 capsule by mouth daily.     Semaglutide, 2 MG/DOSE, (OZEMPIC, 2 MG/DOSE,) 8 MG/3ML SOPN Inject 2 mg as directed once a week. 3 mL 2   telmisartan (MICARDIS) 20 MG tablet Take 20 mg by mouth daily.     traZODone (DESYREL) 150 MG tablet TAKE 1 TABLET BY MOUTH EVERYDAY AT BEDTIME 90 tablet 1   TRESIBA FLEXTOUCH 200 UNIT/ML FlexTouch Pen INJECT 20 UNITS DAILY. TITRATE AS INSTRUCTED. MAX DAILY DOSE: 40 UNITS 20 mL 2   ursodiol (ACTIGALL) 500 MG tablet Take 500 mg by mouth 2 (two) times daily.     vitamin B-12 (CYANOCOBALAMIN) 500 MCG tablet Take 500 mcg by mouth daily.     Vitamin D, Ergocalciferol, (DRISDOL) 1.25 MG (50000 UNIT) CAPS capsule TAKE 1 CAPSULE BY MOUTH ONE TIME  PER WEEK 12 capsule 1   Fludrocortisone Acetate (FLORINEF PO) Take 0.1 mg by mouth daily.     No facility-administered medications prior to visit.    Review of Systems;  Patient denies headache, fevers, malaise, unintentional weight loss, skin rash, eye pain, sinus congestion and sinus pain, sore throat, dysphagia,  hemoptysis , cough, dyspnea, wheezing, chest pain, palpitations, orthopnea, edema, abdominal pain, nausea, melena, diarrhea, constipation, flank pain, dysuria, hematuria, urinary  Frequency, nocturia, numbness, tingling, seizures,  Focal weakness, Loss of consciousness,  Tremor, insomnia, depression, anxiety, and suicidal ideation.      Objective:  BP 114/70 (BP Location: Left Arm, Patient Position: Sitting, Cuff Size: Normal)   Pulse 84   Temp 97.9 F (36.6 C) (Oral)   Ht '5\' 1"'$  (1.549 m)   Wt 164 lb 12.8 oz (74.8 kg)   SpO2 92%   BMI 31.14 kg/m   BP Readings from Last 3 Encounters:  01/05/22  114/70  11/04/21 120/82  09/09/21 120/72    Wt Readings from Last 3 Encounters:  01/05/22 164 lb 12.8 oz (74.8 kg)  11/04/21 168 lb 12.8 oz (76.6 kg)  09/09/21 158 lb (71.7 kg)    General appearance: alert, cooperative and appears stated age Ears: normal TM's and external ear canals both ears Throat: lips, mucosa, and tongue normal; teeth and gums normal Neck: no adenopathy, no carotid bruit, supple, symmetrical, trachea midline and thyroid not enlarged, symmetric, no tenderness/mass/nodules Back: symmetric, no curvature. ROM normal. No CVA tenderness. Lungs: clear to auscultation bilaterally Heart: regular rate and rhythm, S1, S2 normal, no murmur, click, rub or gallop Abdomen: soft, non-tender; bowel sounds normal; no masses,  no organomegaly Pulses: 2+ and symmetric Skin: Skin color, texture, turgor normal. No rashes or lesions Lymph nodes: Cervical, supraclavicular, and axillary nodes normal.  Lab Results  Component Value Date   HGBA1C 7.3 (A) 11/04/2021   HGBA1C 9.3 (H) 08/06/2021   HGBA1C 8.3 (H) 05/03/2021    Lab Results  Component Value Date   CREATININE 1.27 (H) 08/06/2021   CREATININE 1.18 06/02/2021   CREATININE 0.97 05/03/2021    Lab Results  Component Value Date   WBC 7.9 05/03/2021   HGB 13.2 05/03/2021   HCT 42.1 05/03/2021   PLT 153.0 05/03/2021   GLUCOSE 340 (H) 08/06/2021   CHOL 192 08/06/2021   TRIG 240.0 (H) 08/06/2021   HDL 51.30 08/06/2021   LDLDIRECT 114.0 08/06/2021   LDLCALC 39 05/03/2021   ALT 34 08/06/2021   AST 38 (H) 08/06/2021   NA 137 08/06/2021   K 3.9 08/06/2021   CL 97 08/06/2021   CREATININE 1.27 (H) 08/06/2021   BUN 25 (H) 08/06/2021   CO2 32 08/06/2021   TSH 8.98 (H) 11/04/2021   INR 1.5 (H) 03/20/2020   HGBA1C 7.3 (A) 11/04/2021   MICROALBUR 0.5 06/12/2020    Mammogram 3D SCREEN BREAST BILATERAL  Result Date: 09/24/2021 CLINICAL DATA:  Screening. EXAM: DIGITAL SCREENING BILATERAL MAMMOGRAM WITH TOMOSYNTHESIS AND  CAD TECHNIQUE: Bilateral screening digital craniocaudal and mediolateral oblique mammograms were obtained. Bilateral screening digital breast tomosynthesis was performed. The images were evaluated with computer-aided detection. COMPARISON:  Previous exam(s). ACR Breast Density Category c: The breast tissue is heterogeneously dense, which may obscure small masses. FINDINGS: There are no findings suspicious for malignancy. IMPRESSION: No mammographic evidence of malignancy. A result letter of this screening mammogram will be mailed directly to the patient. RECOMMENDATION: Screening mammogram in one year. (Code:SM-B-01Y) BI-RADS CATEGORY  1: Negative. Electronically Signed  By: Lovey Newcomer M.D.   On: 09/24/2021 14:35    Assessment & Plan:   Problem List Items Addressed This Visit   None   I spent a total of   minutes with this patient in a face to face visit on the date of this encounter reviewing the last office visit with me on        ,  most recent with patient's cardiologist in    ,  patient'ss diet and eating habits, home blood pressure readings ,  most recent imaging study ,   and post visit ordering of testing and therapeutics.    Follow-up: No follow-ups on file.   [name], MD

## 2022-01-05 NOTE — Patient Instructions (Addendum)
Before we add any more sleep meds,  we need to turn your "clock" around:  No  daytime  naps Computer games only during the day Melatonin 10 mg at dinnertime Trazodone at dinnertime  Give this regimen 2 weeks of a trial and update me then   Your blood sugars need better control :  Increase  tresiba to 15 units  starting today  Incase morning breakfast insulin to 15 units Continue 12 units at lunch Increase dinner insulin to 19 if the meal is high carb or planning to have dessert   Your vegetable choices are high carb if they are casseroles or breaded  Healthy Choice "low carb power bowl"  entrees and  "Steamer" entrees are are great low carb entrees that microwave in 5 minutes

## 2022-01-06 NOTE — Assessment & Plan Note (Signed)
Patient is scheduled to see Dr Renne Crigler in one month but is having BS well over 200 most of the time, despite 4 insulin doses daily  Reviewed her diet; she lacks some understand of carbohydrate content of foods  And instruction was given.  Will make small adjustments today:   Increase  tresiba to 15 units  starting today  Incase morning breakfast insulin to 15 units Continue 12 units at lunch Increase dinner insulin to 19 if the meal is high carb or planning to have dessert

## 2022-01-06 NOTE — Assessment & Plan Note (Addendum)
Planned cardioversion at [facility] On June 26 for atrial flutter was  deferred as she was in sinus rhythm at time of evaluation

## 2022-01-07 ENCOUNTER — Ambulatory Visit: Payer: Medicare HMO | Admitting: *Deleted

## 2022-01-07 DIAGNOSIS — E1159 Type 2 diabetes mellitus with other circulatory complications: Secondary | ICD-10-CM | POA: Diagnosis not present

## 2022-01-07 DIAGNOSIS — I509 Heart failure, unspecified: Secondary | ICD-10-CM | POA: Diagnosis not present

## 2022-01-07 DIAGNOSIS — I482 Chronic atrial fibrillation, unspecified: Secondary | ICD-10-CM

## 2022-01-07 DIAGNOSIS — Z794 Long term (current) use of insulin: Secondary | ICD-10-CM

## 2022-01-07 DIAGNOSIS — I110 Hypertensive heart disease with heart failure: Secondary | ICD-10-CM

## 2022-01-07 DIAGNOSIS — N1832 Chronic kidney disease, stage 3b: Secondary | ICD-10-CM

## 2022-01-07 DIAGNOSIS — I5022 Chronic systolic (congestive) heart failure: Secondary | ICD-10-CM

## 2022-01-07 NOTE — Patient Instructions (Addendum)
Visit Information  Thank you for taking time to visit with me today. Please don't hesitate to contact me if I can be of assistance to you before our next scheduled telephone appointment.  Following are the goals we discussed today:  Take medications as prescribed   Attend all scheduled provider appointments Call provider office for new concerns or questions  call office if I gain more than 2 pounds in one day or 5 pounds in one week keep legs up while sitting weigh myself daily bring diary to all appointments develop a rescue plan follow rescue plan if symptoms flare-up know when to call the doctorbased on weight and action plan check blood sugar at prescribed times: three times daily and when you have symptoms of low or high blood sugar enter blood sugar readings and medication or insulin into daily log take the blood sugar log to all doctor visits take the blood sugar meter to all doctor visits keep feet up while sitting check blood pressure 3 times per week keep a blood pressure log take blood pressure log to all doctor appointments develop an action plan for high blood pressure keep all doctor appointments Contact provider for shortness of breath Make sure you eat bedtime snack and do not skip meals throughout the day; may drink Glucerna  Our next appointment is by telephone on 8/7 at 1000  Please call the care guide team at [phone] if you need to cancel or reschedule your appointment.   If you are experiencing a Mental Health or [place] or need someone to talk to, please call the Suicide and Crisis Lifeline: 988 call the [place] National Suicide Prevention Lifeline: 864-825-0103 or TTY: (318)820-5216 TTY 612-201-3416) to talk to a trained counselor call 1-800-273-TALK (toll free, 24 hour hotline) call 911   Patient verbalizes understanding of instructions and care plan provided today and agrees to view in [place]. Active MyChart status and patient  understanding of how to access instructions and care plan via MyChart confirmed with patient.     Hubert Azure RN, MSN RN Care Management Coordinator [place] [phone] [email or web address]

## 2022-01-07 NOTE — Chronic Care Management (AMB) (Signed)
Chronic Care Management   CCM RN Visit Note  01/07/2022 Name: Haley Young MRN: [id number] DOB: 1950/11/27  Subjective: Haley Young is a 71 y.o. year old female who is a primary care patient of Derrel Nip, Aris Everts, MD. The care management team was consulted for assistance with disease management and care coordination needs.    Engaged with patient by telephone for follow up visit in response to provider referral for case management and/or care coordination services.   Consent to Services:  The patient was given information about Chronic Care Management services, agreed to services, and gave verbal consent prior to initiation of services.  Please see initial visit note for detailed documentation.   Patient agreed to services and verbal consent obtained.   Assessment: Review of patient past medical history, allergies, medications, health status, including review of consultants reports, laboratory and other test data, was performed as part of comprehensive evaluation and provision of chronic care management services.   SDOH (Social Determinants of Health) assessments and interventions performed:    CCM Care Plan  Allergies  Allergen Reactions   Oxycodone Hcl     REACTION: hallucinations   Albuterol Anxiety    Other reaction(s): Other (See Comments) Tremors    Oxycodone Hcl Other (See Comments)    halucinations    Outpatient Encounter Medications as of 01/07/2022  Medication Sig Note   furosemide (LASIX) 20 MG tablet Take 40 mg by mouth daily.    insulin aspart (FIASP FLEXTOUCH) 100 UNIT/ML FlexTouch Pen Inject 16 units with breakfast and lunch and 24 units with supper    insulin aspart (FIASP FLEXTOUCH) 100 UNIT/ML FlexTouch Pen Inject 16 units with breakfast and lunch and 20 units with supper 08/18/2021: Reduce your novolog doses to 14,   14  and 22 FOR NOW    If you still have low sugars after a dinner meal of salad and/or meat and veggies,  then next time you have that meal reduce  the insulin at dinner to 20 units     Increase your Tresiba to 15 units   PER PROVIDER INSTRUCTIONS    JARDIANCE 25 MG TABS tablet TAKE 1 TABLET BY MOUTH DAILY BEFORE BREAKFAST.    amiodarone (PACERONE) 200 MG tablet Take by mouth.    apixaban (ELIQUIS) 5 MG TABS tablet Take 1 tablet (5 mg total) by mouth 2 (two) times daily.    atorvastatin (LIPITOR) 80 MG tablet TAKE 1 TABLET BY MOUTH EVERY DAY    Continuous Blood Gluc Sensor (FREESTYLE LIBRE 3 SENSOR) MISC 1 each by Does not apply route every 14 (fourteen) days.    fludrocortisone (FLORINEF) 0.1 MG tablet Take 100 mcg by mouth daily.    FLUoxetine (PROZAC) 10 MG tablet TAKE 1 TABLET BY MOUTH EVERY DAY    Glucagon (BAQSIMI ONE PACK) 3 MG/DOSE POWD Use 1 puff in one nare as needed for hypoglycemia    hydrocortisone (CORTEF) 10 MG tablet Take 5 mg by mouth 2 (two) times daily. 10 mg QAM, 5 PM    levothyroxine (SYNTHROID) 100 MCG tablet TAKE 1 TABLET DAILY ON EMPTY STOMACH WITH A GLASS OF WATER AT LEAST 60 MINUTES BEFORE BREAKFAST.    metoprolol succinate (TOPROL-XL) 25 MG 24 hr tablet Take 25 mg by mouth daily.    mirtazapine (REMERON) 30 MG tablet Take 1 tablet (30 mg total) by mouth at bedtime.    Probiotic Product (PROBIOTIC ADVANCED PO) Take 1 capsule by mouth daily.    Semaglutide, 2 MG/DOSE, (OZEMPIC, 2  MG/DOSE,) 8 MG/3ML SOPN Inject 2 mg as directed once a week.    telmisartan (MICARDIS) 20 MG tablet Take 20 mg by mouth daily.    traZODone (DESYREL) 150 MG tablet TAKE 1 TABLET BY MOUTH EVERYDAY AT BEDTIME    TRESIBA FLEXTOUCH 200 UNIT/ML FlexTouch Pen INJECT 20 UNITS DAILY. TITRATE AS INSTRUCTED. MAX DAILY DOSE: 40 UNITS    ursodiol (ACTIGALL) 500 MG tablet Take 500 mg by mouth 2 (two) times daily.    vitamin B-12 (CYANOCOBALAMIN) 500 MCG tablet Take 500 mcg by mouth daily.    Vitamin D, Ergocalciferol, (DRISDOL) 1.25 MG (50000 UNIT) CAPS capsule TAKE 1 CAPSULE BY MOUTH ONE TIME PER WEEK    No facility-administered encounter  medications on file as of 01/07/2022.    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   Right hip pain 03/12/2021   CKD (chronic kidney disease) stage 3, GFR 30-59 ml/min (HCC) 01/16/2021   Chronic left SI joint pain 05/01/2019   Transaminitis 05/01/2019   Educated about COVID-19 virus infection 11/15/2018   Anemia 07/06/2018   Chronic atrial fibrillation ([place]) 05/25/2018   Neuropathic pain of hand 05/03/2018   History of cardiac arrest 05/03/2018   Cerebral infarction, watershed distribution, bilateral, remote, resolved 57/84/6962   Chronic systolic heart failure ([place]) 04/26/2018   Dysphagia, oropharyngeal 04/26/2018   Chronic constipation 04/26/2018   Adrenal insufficiency (Addison's disease) ([place]) 01/20/2018   Hospital discharge follow-up 01/20/2018   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   CHB (complete heart block) ([place]) 08/17/2017   Type 2 diabetes mellitus with chronic kidney disease, with long-term current use of insulin ([place]) 05/18/2017   Hyponatremia 11/01/2016   Drug-induced vitamin B12 deficiency anemia 04/16/2016   Fatigue 03/27/2015   Overweight (BMI 25.0-29.9) 10/07/2014   Encounter for preventive health examination 09/13/2013   Hyperlipidemia associated with type 2 diabetes mellitus ([place]) 07/25/2013   Insomnia 07/25/2013   Hypothyroidism 03/29/2012   Screening for breast cancer 03/29/2012   Screening for colon cancer 03/29/2012   Depression, recurrent ([place]) 08/13/2008   GERD 08/13/2008   Osteopenia 08/13/2008   COLONIC POLYPS, HX OF 08/13/2008    Conditions to be addressed/monitored:CHF, HTN, and DMII  Care Plan : [place] (Adult)  Updates made by Leona Singleton, RN since 01/07/2022 12:00 AM      Problem: Knowledge deficit related to self care managemenr of chronic  condition   Priority: Medium     Long-Range Goal: Patient will work with CCM team to gain knowledge for better self care monitoring of chronic conditions   Start Date: 05/28/2021  Expected End Date: 05/28/2022  Recent Progress: Not on track  Priority: Medium  Note:   pCurrent Barriers:  Knowledge Deficits related to plan of care for management of CHF, HTN, and DMII  Chronic Disease Management support and education needs related to CHF, HTN, and DMII   2/8--Reports feeling well.  Denies any shortness of breath or swelling.  Does admit to frequent hypoglycemic episodes, especially early orings.  Mist higher blood glucose reading are just after eating  3/1--Reports SOB on exertion.  Denies edema and reports feeling less SOB when taking increased dose of lasix.  Has had to change insurance companies to support her [place] providers.  Has  scheduled consultation with Endocrinologist Dr. Cruzita Lederer on 3/2.  Has scheduled liver biopsy for this month also.  Continues to have episodes of hypoglycemia and hyperglycemia, 68 last night, up to 100's after treatment and 115 fasting this morning.  Did not weigh this morning  4/24--reports she is doing ok; awaiting CT scan results of liver due to enzymes still being elevated.  Reviewed glucose readings.  Patient having hypoglycemic episodes in afternoons around 4.  Discussed meal intake and need to eat small meals throughout the day increasing protein.  Fasting blood sugars have ranged 130-150's.  Weight 158 pounds.  Denies chest pain, SOB, swelling, or dizziness.  5/24--Reports sob with minimal exersion, swelling in feet, increase in weight 165 pounds (about 10 pounds); attended cardiology appointment and states she was told it was not her heart but she could increase her lasix to 60 mg daily (has not done) and to start checking BPs (has not done).  Continues to have trouble sleeping while  taking trazadone, benadryl, and melaatonin,  fasting nlood sugar was 170 with ranges of 54-200's; continues to have periods of hypoglycemia.  Awaiting pulmonology consult  6/14--Continues with SOB.  Was noted to be in atrial flutter by Cardiologist and scheduled for a DCCV on 6/28.  Still needs to increase dose of lasix.  Discussed how atrial flutter relates to heart failure, weight remain 165 pounds.  Still awaiting pulmonology consult  6/30--Reports heart regaining sinus rhythm prior to cardioversion; but still with SOB.  Awaiting pulmonology consultation.  PCP has ordered physical therapy referral.    RNCM Clinical Goal(s):  Patient will verbalize understanding of plan for management of CHF, HTN, and DMII as evidenced by beginning to monitor blood pressure and daily weight monitor verbalize basic understanding of CHF, HTN, and DMII disease process and self health management plan as evidenced by monitoring vitals demonstrate understanding of rationale for each prescribed medication as evidenced by medication compliance    demonstrate improved health management independence as evidenced by decreasing Hgb A1C by 0.3 points in the next 90 days        through collaboration with RN Care manager, provider, and care team.   Interventions: 1:1 collaboration with primary care provider regarding development and update of comprehensive plan of care as evidenced by provider attestation and co-signature Inter-disciplinary care team collaboration (see longitudinal plan of care) Evaluation of current treatment plan related to  self management and patient's adherence to plan as established by provider   Heart Failure Interventions:  (Status: Goal on track: NO.)  Long Term Goal  Basic overview and discussion of pathophysiology of Heart Failure reviewed Reviewed Heart Failure Action Plan in depth and provided written copy Advised patient to weigh each morning after emptying bladder Discussed importance of  daily weight and advised patient to weigh and record daily Reviewed role of diuretics in prevention of fluid overload and management of heart failure Discussed the importance of keeping all appointments with provider Instructed and encouraged increase in lasix dose Confirmed patient has BP cuff at home and encouraged monitoring Discussed heart failure clinic and encouraged patient to discuss with PCP Discussed increasing activity slowly as tolerated  Diabetes:  (Status: Goal on Track (progressing): YES. Condition stable. Not addressed this visit.) Long Term Goal   Lab Results  Component Value Date   HGBA1C 7.3 (A) 11/04/2021  Assessed patient's understanding of A1c goal: <7% Provided education to patient about basic DM disease process; Reviewed medications with patient and discussed importance of medication adherence;  Reviewed prescribed diet with patient low salt carb modified; Counseled on importance of regular laboratory monitoring as prescribed;        Discussed plans with patient for ongoing care management follow up and provided patient with direct contact information for care management team;      Provided patient with written educational materials related to hypo and hyperglycemia and importance of correct treatment;       Advised patient, providing education and rationale, to check cbg three times daily and record        Discussed hypoglycemia and ways to prevent/reduce episodes Instructed and encouraged bedtime snack and not to skip meals Emotional support and empathy provided to patient Congratulated on A1C reduction  Hypertension: (Status: Condition stable. Not addressed this visit. Goal on track: NO.)  Long Term Goal Last practice recorded BP readings:  BP Readings from Last 3 Encounters:  01/05/22 114/70  11/04/21 120/82  09/09/21 120/72  Most recent eGFR/CrCl: No results found for: "EGFR"  No components found for: "CRCL"  Evaluation of current treatment plan  related to hypertension self management and patient's adherence to plan as established by provider;   Reviewed medications with patient and discussed importance of compliance;  Discussed plans with patient for ongoing care management follow up and provided patient with direct contact information for care management team; Advised patient, providing education and rationale, to monitor blood pressure daily and record, calling PCP for findings outside established parameters;  Discussed complications of poorly controlled blood pressure such as heart disease, stroke, circulatory complications, vision complications, kidney impairment, sexual dysfunction;   Patient Goals/Self-Care Activities: Take medications as prescribed   Attend all scheduled provider appointments Call provider office for new concerns or questions  call office if I gain more than 2 pounds in one day or 5 pounds in one week keep legs up while sitting weigh myself daily bring diary to all appointments develop a rescue plan follow rescue plan if symptoms flare-up know when to call the doctorbased on weight and action plan check blood sugar at prescribed times: three times daily and when you have symptoms of low or high blood sugar enter blood sugar readings and medication or insulin into daily log take the blood sugar log to all doctor visits take the blood sugar meter to all doctor visits keep feet up while sitting check blood pressure 3 times per week keep a blood pressure log take blood pressure log to all doctor appointments develop an action plan for high blood pressure keep all doctor appointments Contact provider for shortness of breath Make sure you eat bedtime snack and do not skip meals throughout the day; may drink Glucerna       Plan:The care management team will reach out to the patient again over the next 60 days.  Hubert Azure RN, MSN RN Care Management Coordinator [place] [phone] [email or web address]

## 2022-01-28 ENCOUNTER — Other Ambulatory Visit (HOSPITAL_COMMUNITY): Payer: Self-pay

## 2022-02-01 ENCOUNTER — Other Ambulatory Visit (HOSPITAL_COMMUNITY): Payer: Self-pay

## 2022-02-02 ENCOUNTER — Other Ambulatory Visit (HOSPITAL_COMMUNITY): Payer: Self-pay

## 2022-02-03 ENCOUNTER — Other Ambulatory Visit (HOSPITAL_COMMUNITY): Payer: Self-pay

## 2022-02-08 ENCOUNTER — Other Ambulatory Visit (HOSPITAL_COMMUNITY): Payer: Self-pay

## 2022-02-10 ENCOUNTER — Encounter: Payer: Self-pay | Admitting: Internal Medicine

## 2022-02-10 ENCOUNTER — Ambulatory Visit (INDEPENDENT_AMBULATORY_CARE_PROVIDER_SITE_OTHER): Payer: Medicare HMO | Admitting: Internal Medicine

## 2022-02-10 VITALS — BP 96/50 | HR 82 | Temp 97.7°F | Resp 14 | Ht 61.0 in | Wt 169.4 lb

## 2022-02-10 DIAGNOSIS — R5382 Chronic fatigue, unspecified: Secondary | ICD-10-CM

## 2022-02-10 DIAGNOSIS — E271 Primary adrenocortical insufficiency: Secondary | ICD-10-CM

## 2022-02-10 DIAGNOSIS — F5104 Psychophysiologic insomnia: Secondary | ICD-10-CM

## 2022-02-10 DIAGNOSIS — R0609 Other forms of dyspnea: Secondary | ICD-10-CM

## 2022-02-10 DIAGNOSIS — I5032 Chronic diastolic (congestive) heart failure: Secondary | ICD-10-CM

## 2022-02-10 NOTE — Assessment & Plan Note (Addendum)
Diagnosed in 2019 with ACTH test.  Managed with hydrocortisone and flornief. . Her systolic hypotension with minimal exertion may respond to increased dose of hydrocortisone which would raise her BS as well. Will defer to Endocrinology for dose changes

## 2022-02-10 NOTE — Assessment & Plan Note (Signed)
Remains problematic. Advised to take trazodone earlier (7 pm) given the late onset, and melatonin at bedtime.

## 2022-02-10 NOTE — Progress Notes (Signed)
Subjective:  Patient ID: Haley Young, female    DOB: 06-Oct-1950  Age: 71 y.o. MRN: [id number]  CC: The primary encounter diagnosis was Dyspnea on exertion. Diagnoses of Chronic diastolic heart failure ([place]), Chronic fatigue, Adrenal insufficiency (Addison's disease) ([place]), and Psychophysiological insomnia were also pertinent to this visit.   HPI ARCOLA FRESHOUR presents for  Chief Complaint  Patient presents with   Follow-up    4 wks sleep & fatigue. Pt states she is feeling the same way & has not seen any improvements. Pt sch to see pulmonary next wk   66 yr olf female with chronic heart failure  with preserved LV function alternating bundle branch block and complete heart block and placement of a Saint Jude biventricular pacemaker in January of 2019, pulmonic stenosis with history of  pulmonary valvotomy at age 51 and SPVR with 63m CE Perimount valve in 2005,  replaced again with Melody valve in 2019,  adrenal insufficiency diagnpsed in 2019, Type 2 DM who presents for follow up on exertional fatigue and dyspnea and chronic insomnia    1) Type 2 DM using Freestyle Libre 2:  sugars running high in the morning and after dinner . Using Tresiba 200 units once daily  and FASP  tid :  12 am  12 am and 14 am . However her most recent A1c was 7.3   2)Insomnia: not sleepy until 2 am .  Taking  melatonin 10 mg at dinner.  Trazodone at bedtime 9 pm    3) Short of breath with minimal exertion .  Leans on cart in store and still short of breath walking.  Short of breath taking a shower .    Pulse ox has been checked at home during episodes and again here in the office today with ambulation and room air saturations are in the mid 90's. .   Outpatient Medications Prior to Visit  Medication Sig Dispense Refill   amiodarone (PACERONE) 200 MG tablet Take by mouth.     apixaban (ELIQUIS) 5 MG TABS tablet Take 1 tablet (5 mg total) by mouth 2 (two) times daily. 60 tablet 11   atorvastatin (LIPITOR) 80 MG  tablet TAKE 1 TABLET BY MOUTH EVERY DAY 90 tablet 1   Continuous Blood Gluc Sensor (FREESTYLE LIBRE 3 SENSOR) MISC 1 each by Does not apply route every 14 (fourteen) days. 6 each 3   fludrocortisone (FLORINEF) 0.1 MG tablet Take 100 mcg by mouth daily.     furosemide (LASIX) 20 MG tablet Take 40 mg by mouth daily.     Glucagon (BAQSIMI ONE PACK) 3 MG/DOSE POWD Use 1 puff in one nare as needed for hypoglycemia 1 each PRN   hydrocortisone (CORTEF) 10 MG tablet Take 5 mg by mouth 2 (two) times daily. 10 mg QAM, 5 PM     insulin aspart (FIASP FLEXTOUCH) 100 UNIT/ML FlexTouch Pen Inject 16 units with breakfast and lunch and 24 units with supper 60 mL 2   insulin aspart (FIASP FLEXTOUCH) 100 UNIT/ML FlexTouch Pen Inject 16 units with breakfast and lunch and 20 units with supper 60 mL 2   JARDIANCE 25 MG TABS tablet TAKE 1 TABLET BY MOUTH DAILY BEFORE BREAKFAST. 90 tablet 3   levothyroxine (SYNTHROID) 100 MCG tablet TAKE 1 TABLET DAILY ON EMPTY STOMACH WITH A GLASS OF WATER AT LEAST 60 MINUTES BEFORE BREAKFAST. 90 tablet 1   metoprolol succinate (TOPROL-XL) 25 MG 24 hr tablet Take 25 mg by mouth daily.  mirtazapine (REMERON) 30 MG tablet Take 1 tablet (30 mg total) by mouth at bedtime. 90 tablet 1   Probiotic Product (PROBIOTIC ADVANCED PO) Take 1 capsule by mouth daily.     Semaglutide, 2 MG/DOSE, (OZEMPIC, 2 MG/DOSE,) 8 MG/3ML SOPN Inject 2 mg as directed once a week. 3 mL 2   telmisartan (MICARDIS) 20 MG tablet Take 20 mg by mouth daily.     traZODone (DESYREL) 150 MG tablet TAKE 1 TABLET BY MOUTH EVERYDAY AT BEDTIME 90 tablet 1   TRESIBA FLEXTOUCH 200 UNIT/ML FlexTouch Pen INJECT 20 UNITS DAILY. TITRATE AS INSTRUCTED. MAX DAILY DOSE: 40 UNITS 20 mL 2   ursodiol (ACTIGALL) 500 MG tablet Take 500 mg by mouth 2 (two) times daily.     vitamin B-12 (CYANOCOBALAMIN) 500 MCG tablet Take 500 mcg by mouth daily.     Vitamin D, Ergocalciferol, (DRISDOL) 1.25 MG (50000 UNIT) CAPS capsule TAKE 1 CAPSULE BY  MOUTH ONE TIME PER WEEK 12 capsule 1   FLUoxetine (PROZAC) 10 MG tablet TAKE 1 TABLET BY MOUTH EVERY DAY (Patient not taking: Reported on 02/10/2022) 90 tablet 1   No facility-administered medications prior to visit.    Review of Systems;  Patient denies headache, fevers, malaise, unintentional weight loss, skin rash, eye pain, sinus congestion and sinus pain, sore throat, dysphagia,  hemoptysis , cough, dyspnea, wheezing, chest pain, palpitations, orthopnea, edema, abdominal pain, nausea, melena, diarrhea, constipation, flank pain, dysuria, hematuria, urinary  Frequency, nocturia, numbness, tingling, seizures,  Focal weakness, Loss of consciousness,  Tremor, insomnia, depression, anxiety, and suicidal ideation.      Objective:  BP (!) 96/50 (BP Location: Left Arm, Patient Position: Sitting, Cuff Size: Normal)   Pulse 82   Temp 97.7 F (36.5 C) (Oral)   Resp 14   Ht '5\' 1"'$  (1.549 m)   Wt 169 lb 6.4 oz (76.8 kg)   SpO2 94%   BMI 32.01 kg/m   BP Readings from Last 3 Encounters:  02/10/22 (!) 96/50  01/05/22 114/70  11/04/21 120/82    Wt Readings from Last 3 Encounters:  02/10/22 169 lb 6.4 oz (76.8 kg)  01/05/22 164 lb 12.8 oz (74.8 kg)  11/04/21 168 lb 12.8 oz (76.6 kg)    General appearance: alert, cooperative and appears stated age Ears: normal TM's and external ear canals both ears Throat: lips, mucosa, and tongue normal; teeth and gums normal Neck: no adenopathy, no carotid bruit, supple, symmetrical, trachea midline and thyroid not enlarged, symmetric, no tenderness/mass/nodules Back: symmetric, no curvature. ROM normal. No CVA tenderness. Lungs: clear to auscultation bilaterally Heart: regular rate and rhythm, S1, S2 normal, no murmur, click, rub or gallop Abdomen: soft, non-tender; bowel sounds normal; no masses,  no organomegaly Pulses: 2+ and symmetric Skin: Skin color, texture, turgor normal. No rashes or lesions Lymph nodes: Cervical, supraclavicular, and axillary  nodes normal.  Lab Results  Component Value Date   HGBA1C 7.3 (A) 11/04/2021   HGBA1C 9.3 (H) 08/06/2021   HGBA1C 8.3 (H) 05/03/2021    Lab Results  Component Value Date   CREATININE 1.27 (H) 08/06/2021   CREATININE 1.18 06/02/2021   CREATININE 0.97 05/03/2021    Lab Results  Component Value Date   WBC 7.9 05/03/2021   HGB 13.2 05/03/2021   HCT 42.1 05/03/2021   PLT 153.0 05/03/2021   GLUCOSE 340 (H) 08/06/2021   CHOL 192 08/06/2021   TRIG 240.0 (H) 08/06/2021   HDL 51.30 08/06/2021   LDLDIRECT 114.0 08/06/2021   LDLCALC  39 05/03/2021   ALT 34 08/06/2021   AST 38 (H) 08/06/2021   NA 137 08/06/2021   K 3.9 08/06/2021   CL 97 08/06/2021   CREATININE 1.27 (H) 08/06/2021   BUN 25 (H) 08/06/2021   CO2 32 08/06/2021   TSH 8.98 (H) 11/04/2021   INR 1.5 (H) 03/20/2020   HGBA1C 7.3 (A) 11/04/2021   MICROALBUR 0.5 06/12/2020    Mammogram 3D SCREEN BREAST BILATERAL  Result Date: 09/24/2021 CLINICAL DATA:  Screening. EXAM: DIGITAL SCREENING BILATERAL MAMMOGRAM WITH TOMOSYNTHESIS AND CAD TECHNIQUE: Bilateral screening digital craniocaudal and mediolateral oblique mammograms were obtained. Bilateral screening digital breast tomosynthesis was performed. The images were evaluated with computer-aided detection. COMPARISON:  Previous exam(s). ACR Breast Density Category c: The breast tissue is heterogeneously dense, which may obscure small masses. FINDINGS: There are no findings suspicious for malignancy. IMPRESSION: No mammographic evidence of malignancy. A result letter of this screening mammogram will be mailed directly to the patient. RECOMMENDATION: Screening mammogram in one year. (Code:SM-B-01Y) BI-RADS CATEGORY  1: Negative. Electronically Signed   By: Lovey Newcomer M.D.   On: 09/24/2021 14:35    Assessment & Plan:   Problem List Items Addressed This Visit     Adrenal insufficiency (Addison's disease) ([place])    Diagnosed in 2019 with ACTH test.  Managed with hydrocortisone and  flornief. . Her systolic hypotension with minimal exertion may respond to increased dose of hydrocortisone which would raise her BS as well. Will defer to Endocrinology for dose changes      Chronic diastolic heart failure ([facility])   Dyspnea on exertion - Primary   Relevant Orders   Ambulatory referral to Physical Therapy   Fatigue    She has profound fatigue with minimal exertion without hypoxia or tachycardia. Several contributing factors:  Her drop in BP to 96 systolic with an in office ambulation may indicate dyssynchrony despite biventricular pacer , inadequately treated  adrenal insufficiency, and deconditioning.  She has no significant  AS,  Mild AR. ,  pulmonary hypertension s/p pulmonic valve rpelacement , last ECHO was  Nov 2022 ECHO ([facility] at [facility]) and noted normal EF  Sees Pulmonology and Endicrinology next week/tomorrow.  Cardiopulmonary rehab ordered        Insomnia    Remains problematic. Advised to take trazodone earlier (7 pm) given the late onset, and melatonin at bedtime.        I spent a total of  38  minutes with this patient in a face to face visit on the date of this encounter reviewing the last office visit with me in June,  her most recent follow up with cardiology in July , last ECHO Nov 202, her diet and eating habits, home blood pressure readings ,  most recent imaging study ,   and post visit ordering of testing and therapeutics.    Follow-up: Return in about 3 months (around 05/13/2022).   [name], MD

## 2022-02-10 NOTE — Assessment & Plan Note (Addendum)
She has profound fatigue with minimal exertion without hypoxia or tachycardia. Several contributing factors:  Her drop in BP to 96 systolic with an in office ambulation may indicate dyssynchrony despite biventricular pacer , inadequately treated  adrenal insufficiency, and deconditioning.  She has no significant  AS,  Mild AR. ,  pulmonary hypertension s/p pulmonic valve rpelacement , last ECHO was  Nov 2022 ECHO (done at [facility]) and noted normal EF  Sees Pulmonology and Endicrinology next week/tomorrow.  Cardiopulmonary rehab ordered

## 2022-02-10 NOTE — Patient Instructions (Addendum)
Take the trazodone at 7 pm  Take melatonin at bedtime 10 mg    Start measuring your blood pressure after your shower  and after an exertion that leaves you breathless   IT MAY BE YOUR ADRENAL INSUFFICIENCY THAT IS DRIVING YOUR FATIGUE  I am changing the PT referral to [place]

## 2022-02-11 ENCOUNTER — Encounter: Payer: Self-pay | Admitting: Internal Medicine

## 2022-02-11 ENCOUNTER — Ambulatory Visit: Payer: Medicare HMO | Admitting: Internal Medicine

## 2022-02-11 VITALS — BP 140/100 | HR 62 | Ht 61.0 in | Wt 171.8 lb

## 2022-02-11 DIAGNOSIS — Z794 Long term (current) use of insulin: Secondary | ICD-10-CM | POA: Diagnosis not present

## 2022-02-11 DIAGNOSIS — E038 Other specified hypothyroidism: Secondary | ICD-10-CM

## 2022-02-11 DIAGNOSIS — E1122 Type 2 diabetes mellitus with diabetic chronic kidney disease: Secondary | ICD-10-CM

## 2022-02-11 DIAGNOSIS — E271 Primary adrenocortical insufficiency: Secondary | ICD-10-CM | POA: Diagnosis not present

## 2022-02-11 DIAGNOSIS — E063 Autoimmune thyroiditis: Secondary | ICD-10-CM

## 2022-02-11 LAB — POCT GLYCOSYLATED HEMOGLOBIN (HGB A1C): Hemoglobin A1C: 8.3 % — AB (ref 4.0–5.6)

## 2022-02-11 NOTE — Patient Instructions (Addendum)
Please continue hydrocortisone 10 mg in am and 5 mg in pm. (May try to increase the daily dose by 5 mg - after you see pulmonology) Continue Florinef 0.1 mg daily.  - You absolutely need to take this medication every day and not skip doses. - Please double the dose if you have a fever, for the duration of the fever. - If you cannot take anything by mouth (vomiting) or you have severe diarrhea so that you eliminate the hydrocortisone pills in your stool, please make sure that you get the hydrocortisone in the vein instead - go to the nearest emergency department/urgent care or you may go to your PCPs office  - Please try to get a MedAlert bracelet or pendant indicating: "Adrenal insufficiency".   Please continue 100 mcg Levothyroxine for now. Take the thyroid hormone every day, with water, at least 30 minutes before breakfast, separated by at least 4 hours from: - acid reflux medications - calcium - iron - multivitamins  Please continue: - Jardiance 25 mg before breakfast - Ozempic 2 mg weekly - Tresiba 16 units daily  Change Fiasp: - before b'fast: 16-18 units  - before lunch: 10-12 units  - before dinner: 12-16 units   Please return in 3-4 months.

## 2022-02-11 NOTE — Progress Notes (Signed)
Patient ID: Haley Young, female   DOB: 05-Feb-1951, 71 y.o.   MRN: [id number]  HPI: Haley Young is a 71 y.o.-year-old female, referred by her PCP, Dr.Tullo, for management of insulin-dependent DM2, diagnosed in 2010, uncontrolled, with complications ([place] CVA, CKD).  In 07/2021, she returned after an absence of more than 3 years - with her husband.  Last visit 3 months ago.  Interim history: No increased urination, blurry vision, nausea. She still has SOB, but no chest pain.  She will see pulmonology next week. No nausea, AP, dizziness, wt. loss. She was taken off amiodarone last week.  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   Of note, she also had suspicion for autoimmune hepatitis - Lived Bx was not suggestive for this, though.  However, reviewing her chart, I do not see clear evidence of autoimmune diabetes.  Adrenal antibodies are also possible not available for review.  Insulin-dependent diabetes: Reviewed HbA1c levels: Lab Results  Component Value Date   HGBA1C 7.3 (A) 11/04/2021   HGBA1C 9.3 (H) 08/06/2021   HGBA1C 8.3 (H) 05/03/2021   HGBA1C 9.2 (H) 01/13/2021   HGBA1C 13.2 (H) 06/12/2020   HGBA1C 7.8 (H) 05/02/2019   HGBA1C 10.4 (H) 11/27/2018   HGBA1C 9.5 06/29/2018   HGBA1C 8.4 (H) 01/17/2018   HGBA1C 7.0 (H) 05/11/2017   HGBA1C 9.1 08/21/2015   HGBA1C 7.5 (H) 03/12/2015   HGBA1C 7.9 (H) 09/09/2014   HGBA1C 7.8 (H) 03/19/2014   HGBA1C 8.8 (H) 11/27/2013  05/11/2017: HbA1c calculated from fructosamine is 6.4%. 09/26/2016: HbA1c calculated from fructosamine is slightly higher, at 7.0%. 04/26/2016: HbA1c calculated from fructosamine is 6.38%. 01/25/2016: HbA1c calculated from fructosamine is 6.35% 08/21/2015: HbA1c calculated from fructosamine is 6.88%  12/18/2014: HbA1c calculated from  fructosamine is 6.88% 09/10/2014: HbA1c calculated from fructosamine is 6.9% 03/19/2014:  HbA1c calculated from fructosamine is 6.57%  06/18/2020: C-peptide 1.21 (0.8-3.85)  01/22/2019: C-peptide 1.8 (0.9-4.5), glucose 159 GAD 65 antibodies 0.01 (<  or = 0.02) Insulin antibodies 0 IA-2 antibodies 0 ZnT8 antibodies <15   05/15/2018: C-peptide 1.6, glucose not available  02/13/2018: DHEA-S <2  01/10/2018: C-peptide 1.8 (0.9-4.5), glucose 159 GAD 65 antibodies 0 Insulin antibodies 0 IA-2 antibodies 0 ZnT8 antibodies <15   Pt is on a regimen of:  - Jardiance 25 mg before breakfast - Tresiba 12 >> 16 units daily - FiAsp 16-16-20 >> 16-12-16 units before each meal - Ozempic 2 mg weekly She was previously on metformin and glipizide ER at our visit in 2019. She was on Januvia and Alogliptin >> too expensive She was on Jardiance 25 mg daily >> started 01/2016 >> however, potassium increased >> stopped (but also costly) We started Glipizide as we had to stop Invokana >> tremors >> stopped She was on Invokana 300 mg daily >> stopped 2/2 high potassium She was on Invokana 300 mg daily - added 08/2013  - costs her 300$ >> at last visit I gave her a coupon for InvokaMet 2 tablets in am ([id number] mg) daily in am and continued Metformin 1000 mg with dinner. She tells she can get the 3 mo supply for 207%$ Tried insulin Lantus and Novolog - ~2011 Tried Cinnamon and vinegar.  Checks her sugars more than 4 times a day with her freestyle libre 2 CGM:   Previously:   Previously:   Lowest sugar was 60s; she has  hypoglycemia awareness at 73.  She does not a glucagon kit at home. No previous hypoglycemia admission.  Highest sugar was 300. No previous DKA admissions.    Pt's meals are: - Breakfast: cereal - Lunch: Sandwich, soup, salad - Dinner: Salad, chicken, fish - Snacks: 2  - + Mild CKD, last BUN/creatinine:  Lab Results  Component Value Date   BUN 25 (H) 08/06/2021   BUN 16 06/02/2021    CREATININE 1.27 (H) 08/06/2021   CREATININE 1.18 06/02/2021  On telmisartan 20 mg daily.  - + HL; last set of lipids: Lab Results  Component Value Date   CHOL 192 08/06/2021   HDL 51.30 08/06/2021   LDLCALC 39 05/03/2021   LDLDIRECT 114.0 08/06/2021   TRIG 240.0 (H) 08/06/2021   CHOLHDL 4 08/06/2021  On Lipitor 80 mg daily.  - last eye exam was in 2023 (she believes). No DR reportedly.  - no numbness and tingling in her R foot, but in L foot  -after her cardiac arrest in the past.  Last foot exam 11/04/2021.  Addison's disease: -Diagnosed in 2019 and 2020 -elevated ACTH, 102 (15-66), undetectable aldosterone.  At last visit, we checked her antiadrenal antibodies and they were positive, confirming that her adrenal insufficiency is related to Addison's disease: Component     Latest Ref Rng 11/04/2021  21-Hydroxylase Antibodies     NEGATIVE  POSITIVE !    She is on hydrocortisone 10 mg in a.m. and 5 mg in p.m. (moved from evening to 5 pm, after her nap) and Florinef 0.1 mg daily.   She is aware about the need to double the dose when she is sick and to get the medication parenterally if she cannot take p.o. orthe hydrocortisone tablets and stool.  She continues to have fatigue and headaches but no weight loss, joint aches, abdominal pain, salt craving.  She has a med alert bracelet mentioning Addison's disease.   Hashimoto's thyroiditis:  She is on levothyroxine 100 mcg daily: - at night! >> moved to am - no calcium - no iron - no multivitamins - no PPIs - not on Biotin  Reviewed TSH levels: 02/03/2022: TSH 2.76 Lab Results  Component Value Date   TSH 8.98 (H) 11/04/2021   TSH 2.90 03/12/2021   TSH 4.112 03/20/2020   TSH 0.76 05/02/2019   TSH 0.29 (A) 08/30/2018   TSH 3.54 01/17/2018   TSH 6.36 (H) 05/10/2017   TSH 1.98 04/14/2016   TSH 0.37 01/25/2016   TSH 2.38 08/21/2015   TSH 6.32 (H) 03/12/2015   TSH 10.17 (H) 12/16/2014   TSH 7.08 (H) 09/09/2014   TSH  3.06 09/13/2013   TSH 4.82 06/28/2012   TSH 6.72 (H) 03/29/2012   TSH 7.23 (H) 08/24/2011   TSH 6.95 (H) 03/05/2010   TSH 7.76 (H) 12/08/2009   TSH 4.28 08/13/2008   No FH of thyroid cancer. No h/o radiation tx to head or neck. No herbal supplements. No Biotin use. No recent steroids use.   She continues to see [facility] at [facility] (Dr. Leonides Schanz). She had a catheterization >> no blockages.  She has pulmonary valve insufficiency.  She also had a pacemaker implanted. She was now also diagnosed with CHF. She and her husband also described that she had an incident in which her heart stopped completely and she had to be resuscitated while traveling out of town after our last visit. She has vitamin B12 and vitamin D deficiency, also, chronic fatigue. She had celiac investigation (blood test)  in 2020 and this was negative  ROS: + see HPI  Past Medical History:  Diagnosis Date   Asthma    Chicken pox    Depression    Diabetes mellitus    Frequent UTI    GERD (gastroesophageal reflux disease)    Heart disease    Heart murmur    Heart valve replaced    heart valve/pulmonary - replacement    Hx of colonic polyps    Hyperhidrosis    especially with hot flashes     Hyperlipidemia    Lipoma    R axilla    MRSA cellulitis    surgical wound infection   Osteoporosis    Pyelocystitis    as a child    Thyroid disease    Urinary incontinence    Past Surgical History:  Procedure Laterality Date   [place], 2005   pulmonary stenosis with valve replacement, Dr. Evelina Dun   PARTIAL HYSTERECTOMY  1970   PULMONARY VEIN STENOSIS REPAIR     VALVE REPLACEMENT     Social History   Socioeconomic History   Marital status: Married    Spouse name: Not on file   Number of children: 2   Years of education: Not on file   Highest education level: Not on file  Occupational History   Occupation: Retired    Fish farm manager: RETIRED   Occupation: Sales   Tobacco  Use   Smoking status: Never   Smokeless tobacco: Never  Vaping Use   Vaping Use: Never used  Substance and Sexual Activity   Alcohol use: Yes    Comment: occasional wine    Drug use: No   Sexual activity: Yes  Other Topics Concern   Not on file  Social History Narrative   Divorced x 2      Sister is pt here- Surveyor, minerals      Cares for 41 year old mother      3 for daughter with Down's syndrome      Adopted son with Asberger's syndrome      G1P1      Cares for 2 disabled children    Social Determinants of Health   Financial Resource Strain: Low Risk  (07/16/2021)   Overall Financial Resource Strain (CARDIA)    Difficulty of Paying Living Expenses: Not hard at all  Food Insecurity: No Food Insecurity (07/16/2021)   Hunger Vital Sign    Worried About Running Out of Food in the Last Year: Never true    Ran Out of Food in the Last Year: Never true  Transportation Needs: No Transportation Needs (07/16/2021)   PRAPARE - Hydrologist (Medical): No    Lack of Transportation (Non-Medical): No  Physical Activity: Unknown (07/15/2020)   Exercise Vital Sign    Days of Exercise per Week: 0 days    Minutes of Exercise per Session: Not on file  Stress: No Stress Concern Present (07/16/2021)   [place]    Feeling of Stress : Not at all  Social Connections: Moderately Integrated (07/16/2021)   Social Connection and Isolation Panel [NHANES]    Frequency of Communication with Friends and Family: More than three times a week    Frequency of Social Gatherings with Friends and Family: Never    Attends Religious Services: Never    Marine scientist or Organizations: Yes    Attends  Club or Organization Meetings: Never    Marital Status: Married  Human resources officer Violence: Not At Risk (07/16/2021)   Humiliation, Afraid, Rape, and Kick questionnaire    Fear of Current or Ex-Partner: No     Emotionally Abused: No    Physically Abused: No    Sexually Abused: No   Current Outpatient Medications on File Prior to Visit  Medication Sig Dispense Refill   amiodarone (PACERONE) 200 MG tablet Take by mouth.     apixaban (ELIQUIS) 5 MG TABS tablet Take 1 tablet (5 mg total) by mouth 2 (two) times daily. 60 tablet 11   atorvastatin (LIPITOR) 80 MG tablet TAKE 1 TABLET BY MOUTH EVERY DAY 90 tablet 1   Continuous Blood Gluc Sensor (FREESTYLE LIBRE 3 SENSOR) MISC 1 each by Does not apply route every 14 (fourteen) days. 6 each 3   fludrocortisone (FLORINEF) 0.1 MG tablet Take 100 mcg by mouth daily.     furosemide (LASIX) 20 MG tablet Take 40 mg by mouth daily.     Glucagon (BAQSIMI ONE PACK) 3 MG/DOSE POWD Use 1 puff in one nare as needed for hypoglycemia 1 each PRN   hydrocortisone (CORTEF) 10 MG tablet Take 5 mg by mouth 2 (two) times daily. 10 mg QAM, 5 PM     insulin aspart (FIASP FLEXTOUCH) 100 UNIT/ML FlexTouch Pen Inject 16 units with breakfast and lunch and 24 units with supper 60 mL 2   insulin aspart (FIASP FLEXTOUCH) 100 UNIT/ML FlexTouch Pen Inject 16 units with breakfast and lunch and 20 units with supper 60 mL 2   JARDIANCE 25 MG TABS tablet TAKE 1 TABLET BY MOUTH DAILY BEFORE BREAKFAST. 90 tablet 3   levothyroxine (SYNTHROID) 100 MCG tablet TAKE 1 TABLET DAILY ON EMPTY STOMACH WITH A GLASS OF WATER AT LEAST 60 MINUTES BEFORE BREAKFAST. 90 tablet 1   metoprolol succinate (TOPROL-XL) 25 MG 24 hr tablet Take 25 mg by mouth daily.     mirtazapine (REMERON) 30 MG tablet Take 1 tablet (30 mg total) by mouth at bedtime. 90 tablet 1   Probiotic Product (PROBIOTIC ADVANCED PO) Take 1 capsule by mouth daily.     Semaglutide, 2 MG/DOSE, (OZEMPIC, 2 MG/DOSE,) 8 MG/3ML SOPN Inject 2 mg as directed once a week. 3 mL 2   telmisartan (MICARDIS) 20 MG tablet Take 20 mg by mouth daily.     traZODone (DESYREL) 150 MG tablet TAKE 1 TABLET BY MOUTH EVERYDAY AT BEDTIME 90 tablet 1   TRESIBA  FLEXTOUCH 200 UNIT/ML FlexTouch Pen INJECT 20 UNITS DAILY. TITRATE AS INSTRUCTED. MAX DAILY DOSE: 40 UNITS 20 mL 2   ursodiol (ACTIGALL) 500 MG tablet Take 500 mg by mouth 2 (two) times daily.     vitamin B-12 (CYANOCOBALAMIN) 500 MCG tablet Take 500 mcg by mouth daily.     Vitamin D, Ergocalciferol, (DRISDOL) 1.25 MG (50000 UNIT) CAPS capsule TAKE 1 CAPSULE BY MOUTH ONE TIME PER WEEK 12 capsule 1   No current facility-administered medications on file prior to visit.   Allergies  Allergen Reactions   Oxycodone Hcl     REACTION: hallucinations   Albuterol Anxiety    Other reaction(s): Other (See Comments) Tremors    Oxycodone Hcl Other (See Comments)    halucinations   Family History  Problem Relation Age of Onset   Coarctation of the aorta Sister    Diabetes Mother    COPD Mother    Obesity Mother    Depression Mother  Pancreatic cancer Mother    Alcohol abuse Father    Cirrhosis Father    Down syndrome Brother    Pneumonia Brother    Down syndrome Daughter    Lupus Daughter    Ulcerative colitis Daughter    Diabetes Maternal Aunt    Breast cancer Maternal Aunt    Breast cancer Maternal Aunt    PE: BP (!) 140/100 (BP Location: Right Arm, Patient Position: Sitting, Cuff Size: Normal)   Pulse 62   Ht '5\' 1"'  (1.549 m)   Wt 171 lb 12.8 oz (77.9 kg)   SpO2 94%   BMI 32.46 kg/m  Wt Readings from Last 3 Encounters:  02/11/22 171 lb 12.8 oz (77.9 kg)  02/10/22 169 lb 6.4 oz (76.8 kg)  01/05/22 164 lb 12.8 oz (74.8 kg)   Constitutional: normal weight, in NAD Eyes: EOMI, no exophthalmos ENT: moist mucous membranes, no thyromegaly, no cervical lymphadenopathy Cardiovascular: RRR, No RG, +1/6 SEM Respiratory: CTA B Musculoskeletal: no deformities Skin: moist, warm, no rashes Neurological: no tremor with outstretched hands  ASSESSMENT: 1.  Insulin-dependent diabetes, uncontrolled, with complications - CKD - CVA  2.  Addison's disease  3.  Hashimoto's  hypothyroidism  PLAN:  1. Patient with history of diabetes, diagnosed as type I before she reestablished care with me in 09/2021, after an absence of 4 years.  At that time, she returned on a basal/bolus insulin regimen, SGLT2 inhibitor, and weekly GLP-1 receptor agonist.  We continued this regimen but I advised her to take Fiasp before meals, as she was taking it after meals.  I did review her investigation for insulin production and antipancreatic antibodies and these did not point towards type 1 diabetes.  I am not sure if I have all of her records...  The pattern of her blood sugars is indicative of insulin deficiency, though. -Reviewing her CGM downloads at last visit, sugars appear to be improved.  They were slightly higher after breakfast, decreasing to the point of lows occasionally after lunch (50s) and then increasing after 5 to 6 PM, but still with occasional low blood sugars in the evening.  At that time we increased the dose of [place] and decreased the dose of Fiasp. CGM interpretation: -At today's visit, we reviewed her CGM downloads: It appears that 78% of values are in target range (goal >70%), while 21% are higher than 180 (goal <25%), and 1% are lower than 70 (goal <4%).  The calculated average blood sugar is 151.  The projected HbA1c for the next 3 months (GMI) is 6.9%. -Reviewing the CGM trends, her sugars are slightly higher overnight, they increase occasionally after breakfast, and they are mostly within the target range later in the day but more fluctuating.  She has occasional quite high blood sugars after snack at night.  She mentions that she eats the snack because her sugars may be low after dinner. -At today's visit, based on the above pattern, I advised her to try to increase her Fiasp before a larger breakfast, back off the Fiasp dose before lunch even more, and also back off the dinnertime insulin so her sugars are not low when she goes to bed.  I did advise her that ideally  she would stop the snacks at night, but if she does have a snack, to use foods without carbs, for example unsalted peanuts or veggies.  If she does have carbs for snacks, she may need for units of insulin before these.  For now, I did  not advise her to change the rest of the regimen. - I suggested to: Patient Instructions  Please continue hydrocortisone 10 mg in am and 5 mg in pm. (May try to increase the daily dose by 5 mg - after you see pulmonology) Continue Florinef 0.1 mg daily.  - You absolutely need to take this medication every day and not skip doses. - Please double the dose if you have a fever, for the duration of the fever. - If you cannot take anything by mouth (vomiting) or you have severe diarrhea so that you eliminate the hydrocortisone pills in your stool, please make sure that you get the hydrocortisone in the vein instead - go to the nearest emergency department/urgent care or you may go to your PCPs office  - Please try to get a MedAlert bracelet or pendant indicating: "Adrenal insufficiency".   Please continue 100 mcg Levothyroxine for now. Take the thyroid hormone every day, with water, at least 30 minutes before breakfast, separated by at least 4 hours from: - acid reflux medications - calcium - iron - multivitamins  Please continue: - Jardiance 25 mg before breakfast - Ozempic 2 mg weekly - Tresiba 16 units daily  Change Fiasp: - before b'fast: 16-18 units  - before lunch: 10-12 units  - before dinner: 12-16 units   Please return in 3-4 months.  - we checked her HbA1c: 8.3% (higher) - advised to check sugars at different times of the day - 4x a day, rotating check times - advised for yearly eye exams >> she is UTD - return to clinic in 3-4 months  2.  Adrenal insufficiency  -Diagnosed at an outside hospital and confirmed at last visit when her 21-hydroxylase antibodies returned positive -She is on hydrocortisone 10 mg in a.m. and 5 mg in p.m.  She also  continues on Florinef 0.1 mg daily.   -No hypotension at today's visit.  No salt cravings.  She does have chronic fatigue.  No nausea/abdominal pain. -At today's visit, we again discussed about sick day rules: double the dose if she has fever or increased stress, obtain the medication intramuscularly or intravenously if she cannot take the p.o. she did not have to do this since last visit.  She does have the solution at home. -She does have a med alert bracelet mentioning Addison's disease.  3.  Hashimoto's hypothyroidism - latest thyroid labs reviewed with pt. >> normal: 02/03/2022: TSH 2.76 - she continues on LT4 100 mcg daily - pt feels good on this dose. - we discussed about taking the thyroid hormone every day, with water, >30 minutes before breakfast, separated by >4 hours from acid reflux medications, calcium, iron, multivitamins. Pt. is taking it correctly.  Philemon Kingdom, MD PhD [facility]

## 2022-02-14 ENCOUNTER — Ambulatory Visit: Payer: Self-pay

## 2022-02-14 NOTE — Patient Outreach (Signed)
  Care Coordination   Follow Up Visit Note   02/14/2022 Name: Haley Young MRN: [id number] DOB: [birth year]  Haley Young is a 71 y.o. year old female who sees Derrel Nip, Aris Everts, MD for primary care. I spoke with  [name] by phone today  What matters to the patients health and wellness today?  The shortness of breath I am having    Goals Addressed             This Visit's Progress    Patient Stated: "I want to find out and manage what is going on with my shortness of breath"       Care Coordination Interventions: Evaluation of current treatment plan related to shortness of breath and patient's adherence to plan as established by provider Reviewed medications with patient and discussed importance of compliance Reviewed scheduled/upcoming provider appointments  Advised patient to take frequent rest breaks as necessary  Advised to contact her provider for any change / concern regarding her condition Call 911 for severe shortness of breath          SDOH assessments and interventions completed:  No     Care Coordination Interventions Activated:  Yes  Care Coordination Interventions:  Yes, provided   Follow up plan: Follow up call scheduled for 03/08/22 at 10:00 am    Encounter Outcome:  Pt. Scheduled   Quinn Plowman RN,BSN,CCM [place] Coordinator [phone]

## 2022-02-14 NOTE — Patient Instructions (Signed)
Visit Information  Thank you for taking time to visit with me today. Please don't hesitate to contact me if I can be of assistance to you.   Following are the goals we discussed today:   Goals Addressed             This Visit's Progress    Patient Stated: "I want to find out and manage what is going on with my shortness of breath"       Care Coordination Interventions: Evaluation of current treatment plan related to shortness of breath and patient's adherence to plan as established by provider Reviewed medications with patient and discussed importance of compliance Reviewed scheduled/upcoming provider appointments  Advised patient to take frequent rest breaks as necessary  Advised to contact her provider for any change / concern regarding her condition Call 911 for severe shortness of breath          Our next appointment is by telephone on 03/08/22 at 10:00 am  Please call the care guide team at [phone] if you need to cancel or reschedule your appointment.   If you are experiencing a Mental Health or [place] or need someone to talk to, please call the Suicide and Crisis Lifeline: 988 call 1-800-273-TALK (toll free, 24 hour hotline)  Patient verbalizes understanding of instructions and care plan provided today and agrees to view in [place]. Active MyChart status and patient understanding of how to access instructions and care plan via MyChart confirmed with patient.     Quinn Plowman RN,BSN,CCM RN Care Manager Coordinator [phone]

## 2022-02-17 ENCOUNTER — Other Ambulatory Visit (HOSPITAL_COMMUNITY): Payer: Self-pay

## 2022-02-23 ENCOUNTER — Encounter: Payer: Self-pay | Admitting: Internal Medicine

## 2022-02-23 ENCOUNTER — Other Ambulatory Visit (HOSPITAL_COMMUNITY): Payer: Self-pay

## 2022-03-08 ENCOUNTER — Ambulatory Visit: Payer: Self-pay

## 2022-03-08 NOTE — Patient Outreach (Signed)
  Care Coordination   Follow Up Visit Note   03/08/2022 Name: Haley Young MRN: [id number] DOB: [birth year]  Haley Young is a 71 y.o. year old female who sees Haley Young, Haley Everts, MD for primary care. I spoke with  Haley Young by phone today.  What matters to the patients health and wellness today?  Patient states she continues to have shortness of breath with activity.  She states she thought she was recommended to have cardiac rehab but has not received a call for an appointment.  Patient states she is scheduled to see her cardiologist on 03/23/22. Patient reports having recent follow up with pulmonologist who prescribed Advair.  Patient denies seeing much improvement from new medication added.     Goals Addressed             This Visit's Progress    Patient Stated: "I want to find out and manage what is going on with my shortness of breath"       Care Coordination Interventions: Evaluation of current treatment plan related to shortness of breath and patient's adherence to plan as established by provider Reviewed medications with patient and discussed importance of compliance Reviewed scheduled/upcoming provider appointments  RNCM contacted primary care provider via in basket message regarding patients physical/ cardiac rehab          SDOH assessments and interventions completed:  No     Care Coordination Interventions Activated:  Yes  Care Coordination Interventions:  Yes, provided   Follow up plan: Follow up call scheduled for 03/30/22 at 10:00 am    Encounter Outcome:  Pt. Visit Completed   Haley Plowman RN,BSN,CCM [place] Coordinator [phone]

## 2022-03-08 NOTE — Patient Instructions (Signed)
Visit Information  Thank you for taking time to visit with me today. Please don't hesitate to contact me if I can be of assistance to you.   Following are the goals we discussed today:   Goals Addressed             This Visit's Progress    Patient Stated: "I want to find out and manage what is going on with my shortness of breath"       Care Coordination Interventions: Evaluation of current treatment plan related to shortness of breath and patient's adherence to plan as established by provider Reviewed medications with patient and discussed importance of compliance Reviewed scheduled/upcoming provider appointments  RNCM contacted primary care provider via in basket message regarding patients physical/ cardiac rehab          Our next appointment is by telephone on 03/30/22  at 10:00 am  Please call the care guide team at [phone] if you need to cancel or reschedule your appointment.   If you are experiencing a Mental Health or [place] or need someone to talk to, please call 1-800-273-TALK (toll free, 24 hour hotline)  Patient verbalizes understanding of instructions and care plan provided today and agrees to view in [place]. Active MyChart status and patient understanding of how to access instructions and care plan via MyChart confirmed with patient.     Quinn Plowman RN,BSN,CCM RN Care Manager Coordinator [phone]

## 2022-03-09 ENCOUNTER — Other Ambulatory Visit: Payer: Self-pay | Admitting: Internal Medicine

## 2022-03-09 DIAGNOSIS — I482 Chronic atrial fibrillation, unspecified: Secondary | ICD-10-CM

## 2022-03-09 DIAGNOSIS — Z954 Presence of other heart-valve replacement: Secondary | ICD-10-CM

## 2022-03-09 DIAGNOSIS — I442 Atrioventricular block, complete: Secondary | ICD-10-CM

## 2022-03-09 DIAGNOSIS — I5032 Chronic diastolic (congestive) heart failure: Secondary | ICD-10-CM

## 2022-03-17 ENCOUNTER — Other Ambulatory Visit: Payer: Self-pay | Admitting: *Deleted

## 2022-03-17 DIAGNOSIS — R0609 Other forms of dyspnea: Secondary | ICD-10-CM

## 2022-03-25 ENCOUNTER — Telehealth: Payer: Self-pay | Admitting: Internal Medicine

## 2022-03-25 ENCOUNTER — Other Ambulatory Visit: Payer: Self-pay | Admitting: Internal Medicine

## 2022-03-25 NOTE — Telephone Encounter (Signed)
FYI

## 2022-03-25 NOTE — Telephone Encounter (Signed)
Patient called and wanted Dr Derrel Nip to know that she  is in [facility] ICU/Cardiac unit. 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

## 2022-03-30 ENCOUNTER — Ambulatory Visit: Payer: Self-pay

## 2022-03-30 NOTE — Patient Outreach (Signed)
  Care Coordination   Follow Up Visit Note   03/30/2022 Name: Haley Young MRN: [id number] DOB: 02-Sep-1950  Haley Young is a 71 y.o. year old female who sees Derrel Nip, Aris Everts, MD for primary care. I    Goals Addressed             This Visit's Progress    COMPLETED: Patient Stated: "I want to find out and manage what is going on with my shortness of breath"       Patient deceased Care Coordination Interventions:           SDOH assessments and interventions completed:  No     Care Coordination Interventions Activated:  No  Care Coordination Interventions:  No, not indicated    Encounter Outcome:  Pt. Visit Completed   Quinn Plowman RN,BSN,CCM [place] [phone] direct line

## 2022-04-10 DEATH — deceased

## 2022-06-17 ENCOUNTER — Ambulatory Visit: Payer: Medicare HMO | Admitting: Internal Medicine

## 2022-06-22 NOTE — Telephone Encounter (Signed)
MyChart messgae sent to patient. 

## 2022-07-18 ENCOUNTER — Telehealth: Payer: Self-pay

## 2022-07-18 NOTE — Telephone Encounter (Signed)
Unable to reach patient when called for scheduled AWV. Called 3x and female answered each time stating he does not speak english. Number dialed [phone]. Secondary number/spouse and home number called. No answer. Unable to leave voicemail. Okay to reschedule.

## 2023-07-30 IMAGING — US US RENAL
1 series · 14 of 25 positions shown · non-contrast
Comparison: None.

CLINICAL DATA: Chronic kidney disease.

EXAM:
RENAL / URINARY TRACT ULTRASOUND COMPLETE

[Series 1: us renal · 0.21mm/px · 14 of 46 slices shown]
[im 1/46]
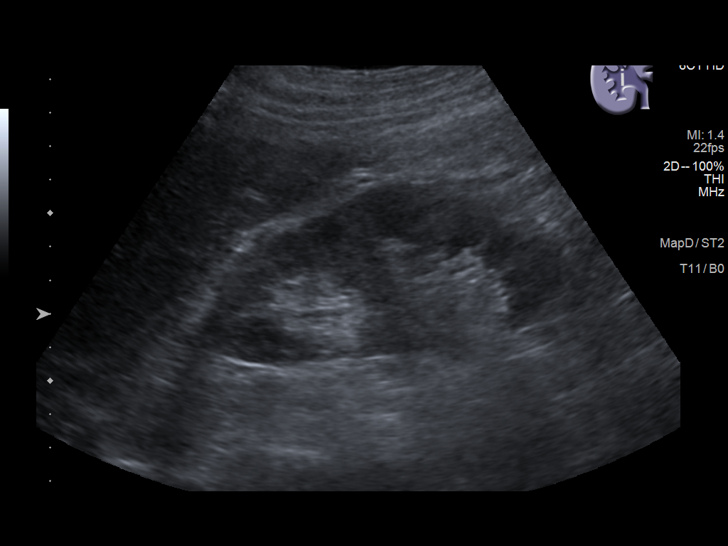
[im 4/46]
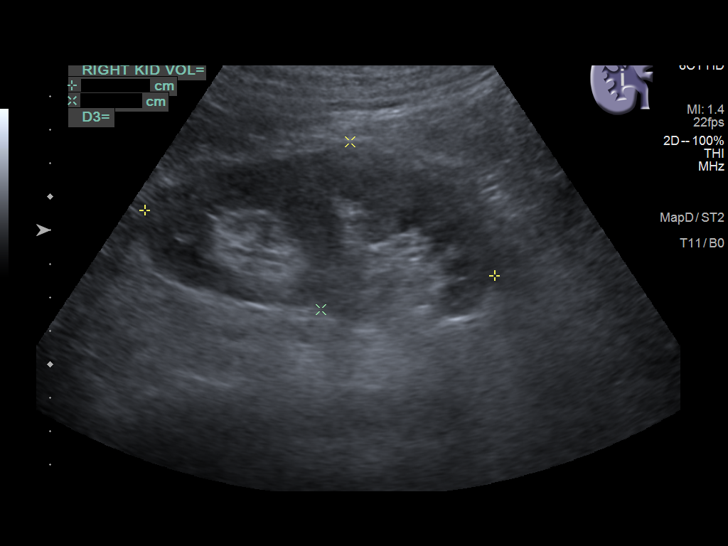
[im 8/46]
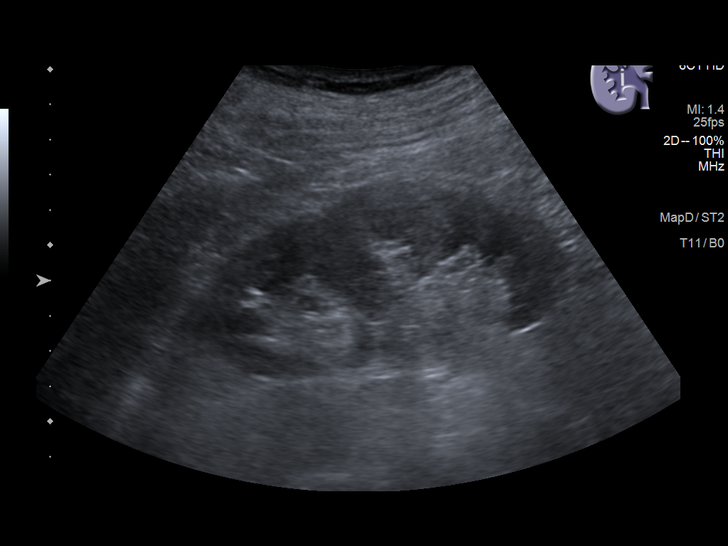
[im 12/46]
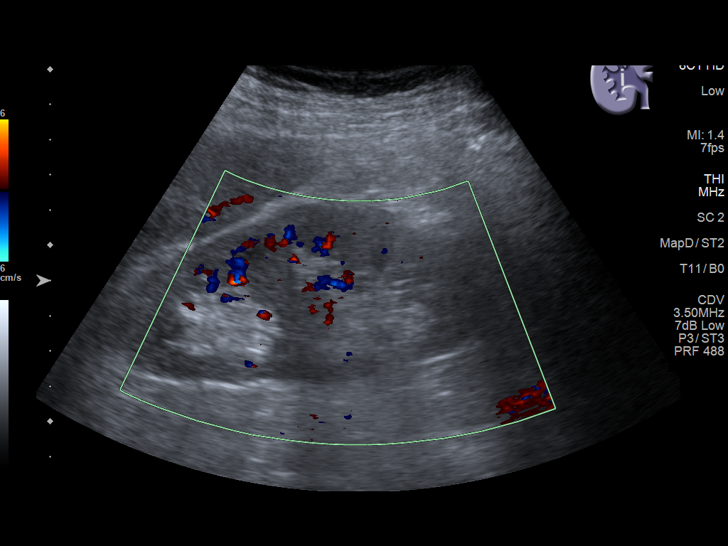
[im 16/46]
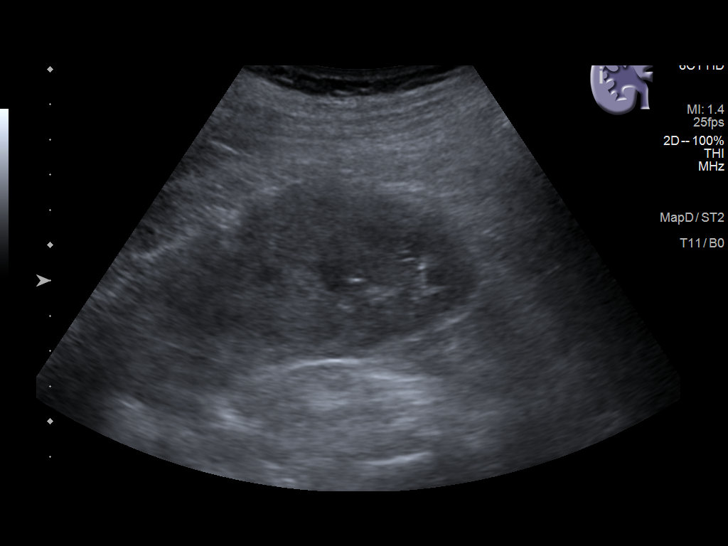
[im 17/46]
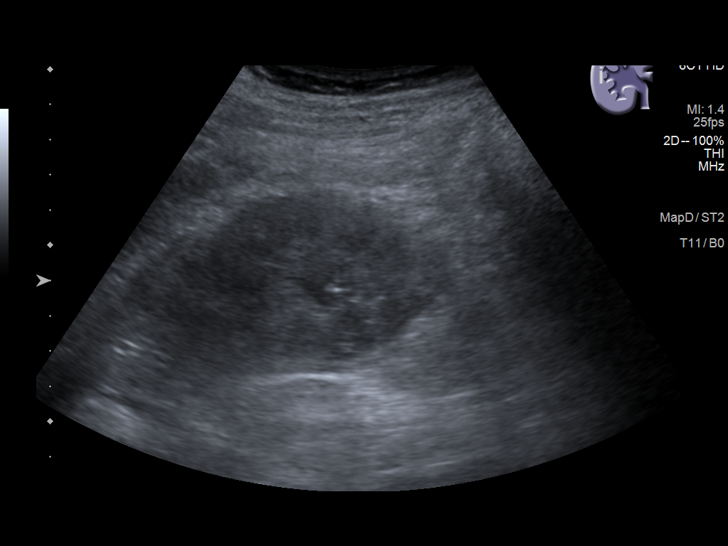
[im 21/46]
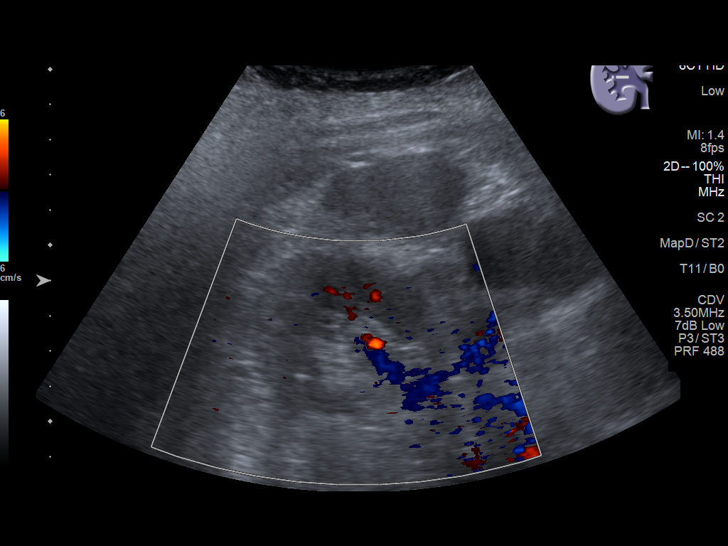
[im 25/46]
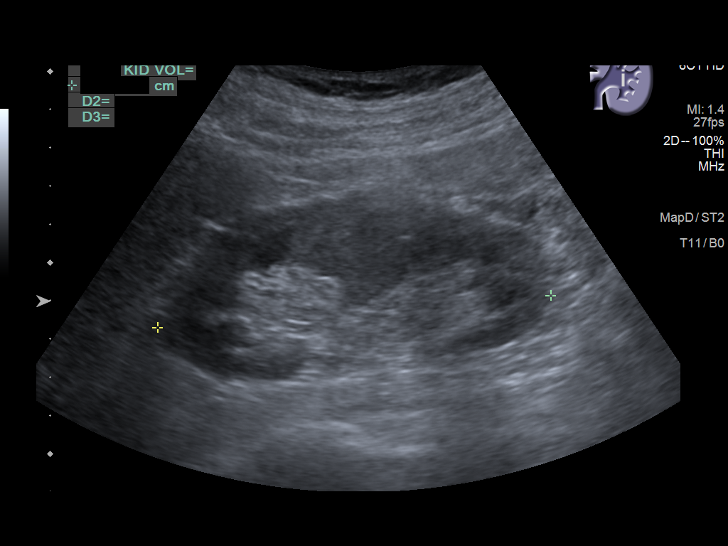
[im 29/46]
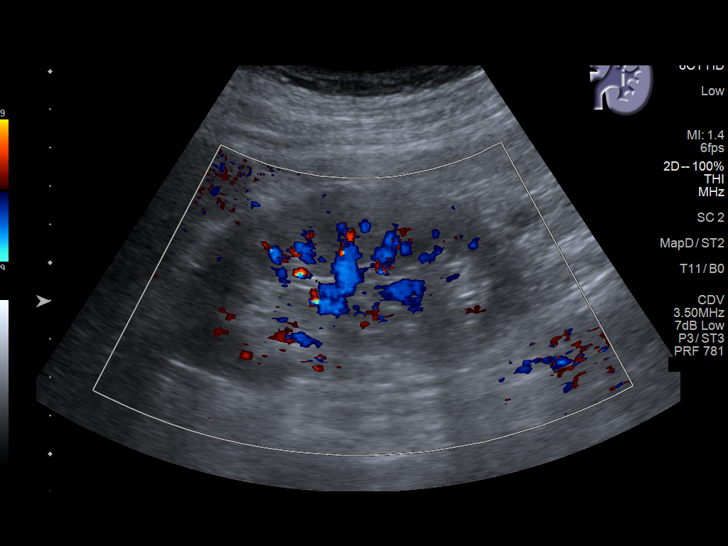
[im 31/46]
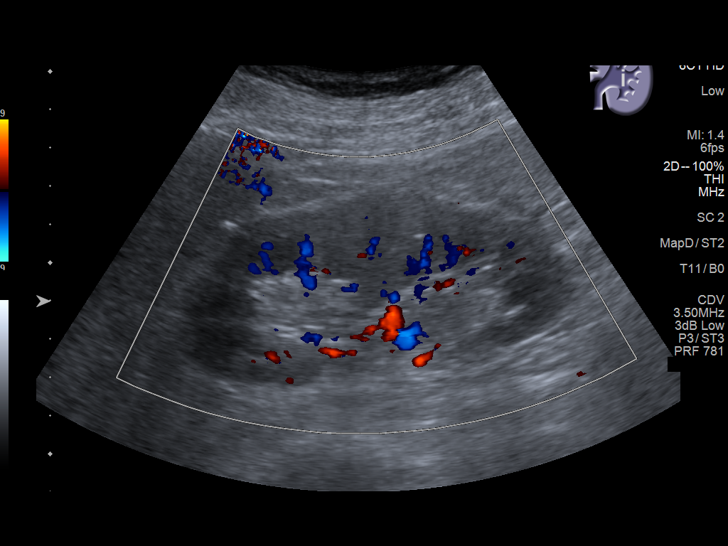
[im 34/46]
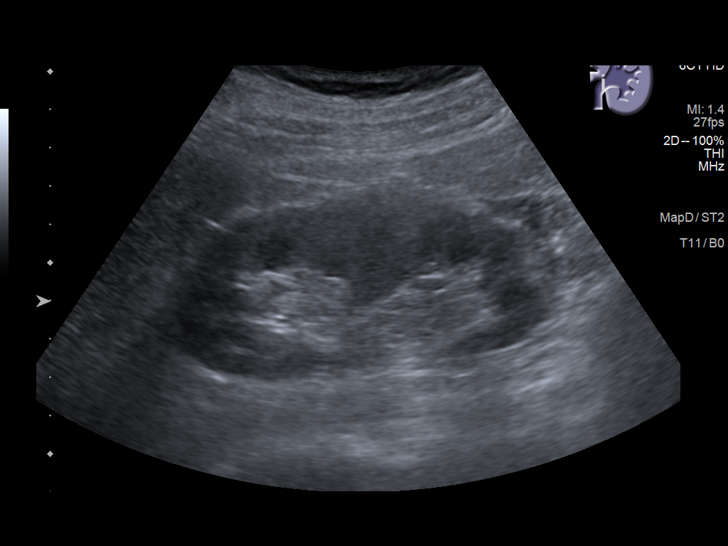
[im 38/46]
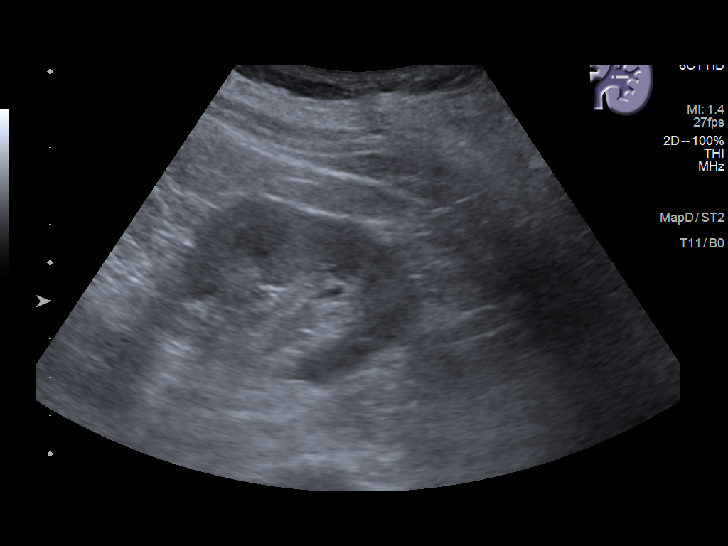
[im 42/46]
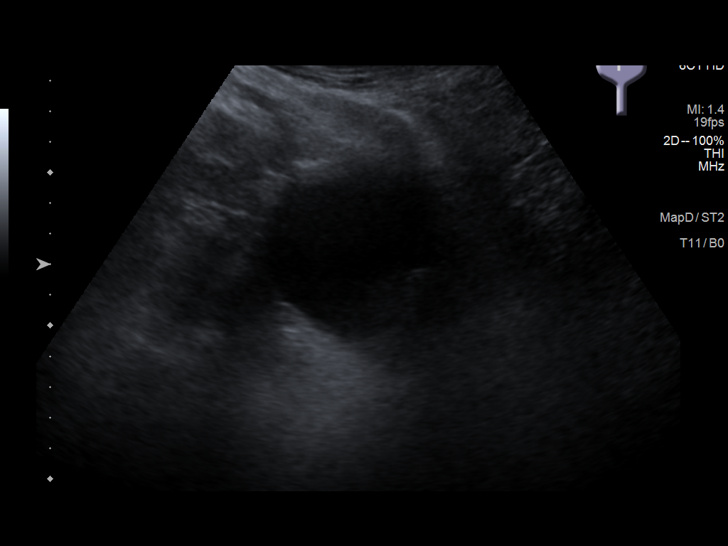
[im 46/46]
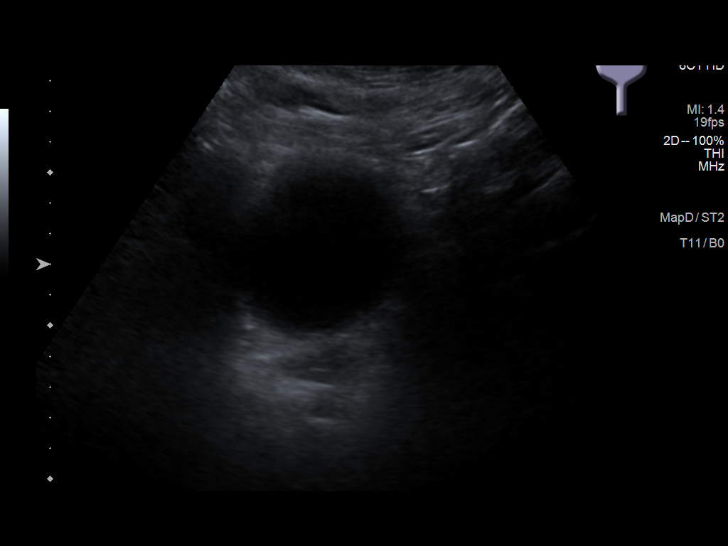

[14 of 25 positions shown; findings below may reference images not displayed]

FINDINGS: Right Kidney:

Renal measurements: 10.6 x 5.1 x 4.2 cm = volume: 120 mL. Normal
echogenicity. No hydronephrosis or shadowing stone.

Left Kidney:

Renal measurements: 10.3 x 4.6 x 4.6 cm = volume: 114 mL. Normal
echogenicity. No hydronephrosis or shadowing stone.

Bladder:

Appears normal for degree of bladder distention.

Other:

None.
IMPRESSION: Unremarkable renal ultrasound.

## 2023-08-01 IMAGING — US US ABDOMEN LIMITED
1 series · 14 of 25 positions shown · non-contrast
Comparison: 05/10/2021, 02/05/2010

CLINICAL DATA: Elevated liver enzymes

EXAM:
ULTRASOUND ABDOMEN LIMITED RIGHT UPPER QUADRANT

[Series 1: us abdomen limited · 0.17mm/px · 14 of 51 slices shown]
[im 1/51]
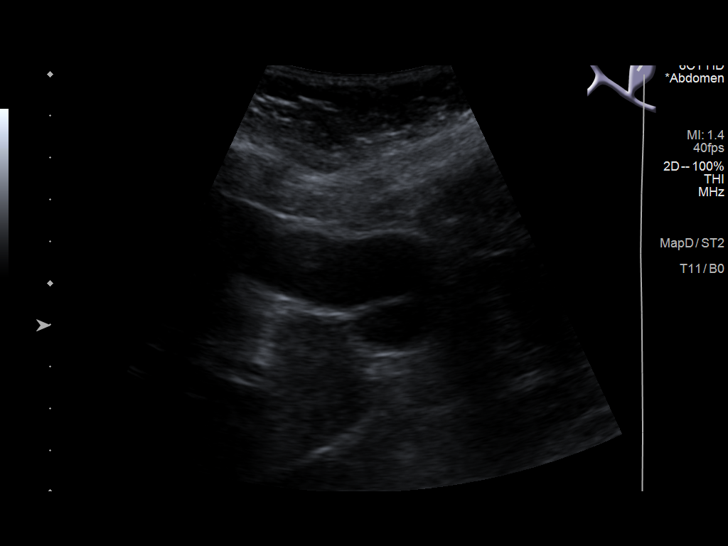
[im 5/51]
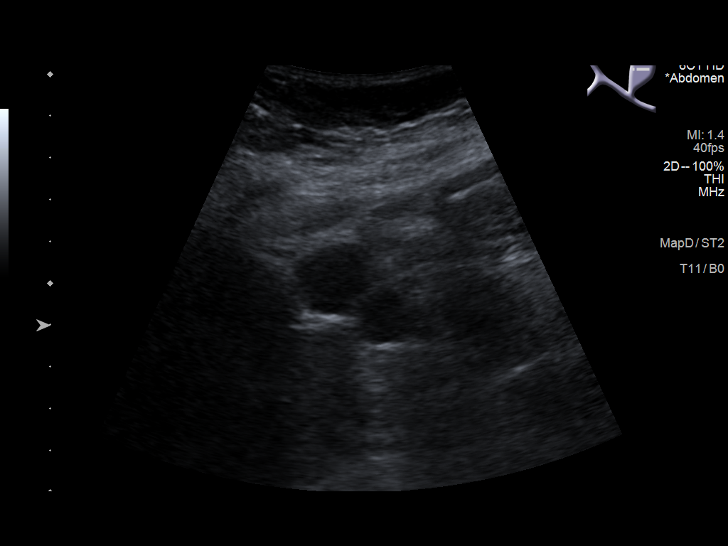
[im 9/51]
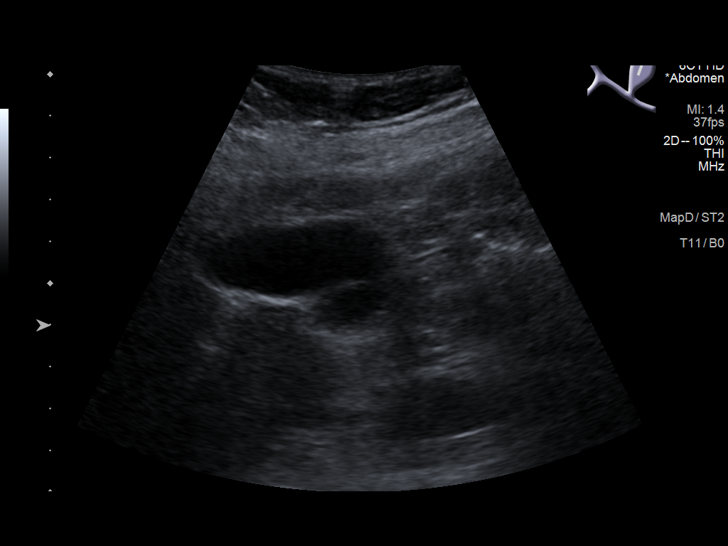
[im 13/51]
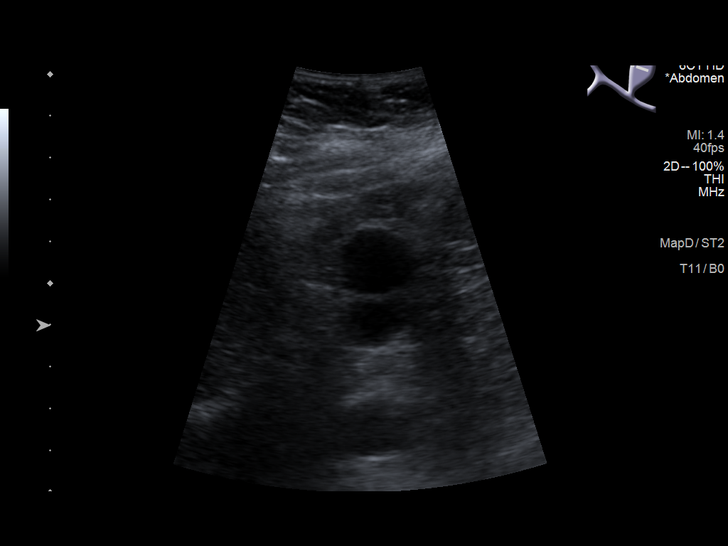
[im 17/51]
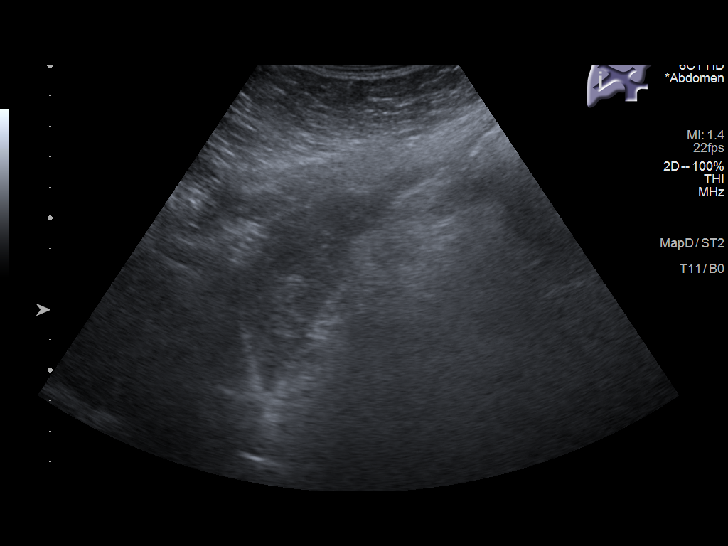
[im 19/51]
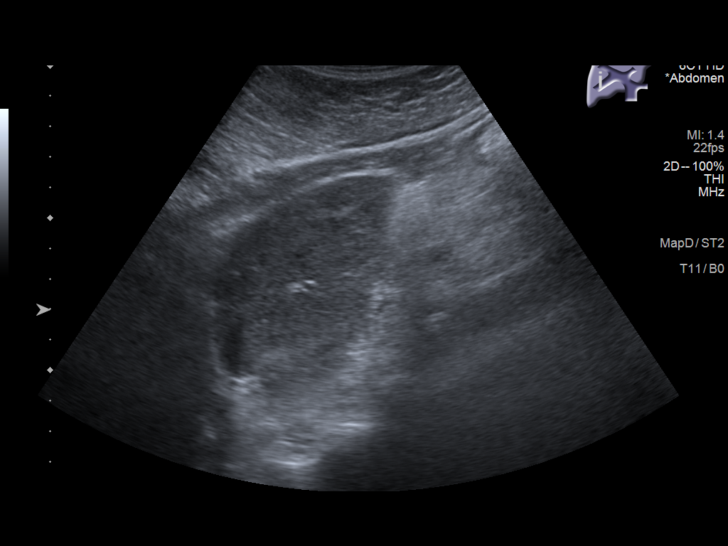
[im 23/51]
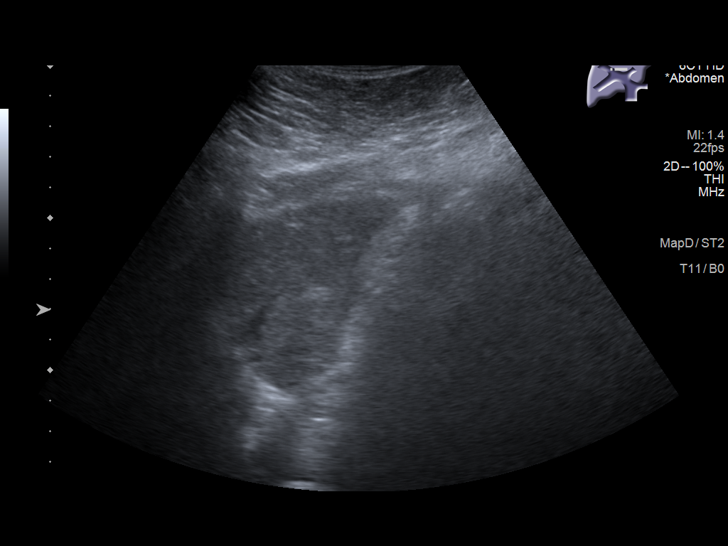
[im 28/51]
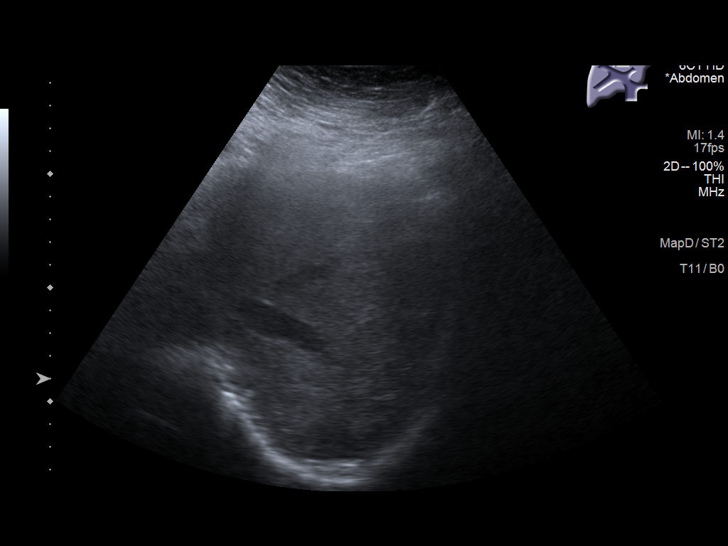
[im 32/51]
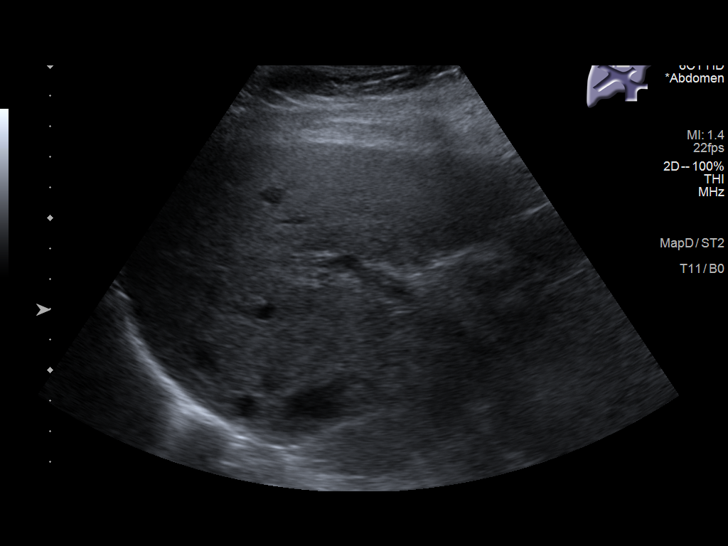
[im 34/51]
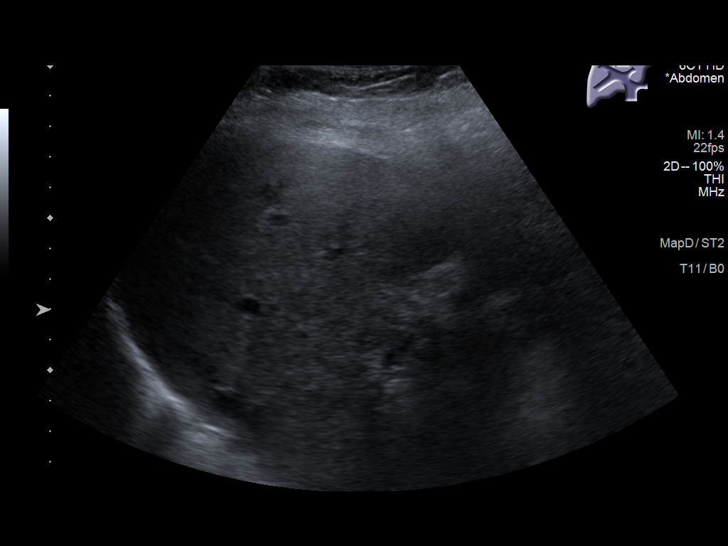
[im 38/51]
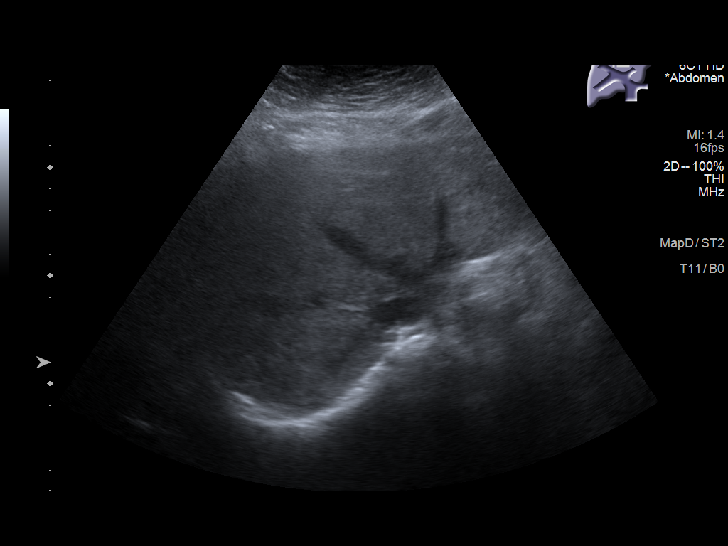
[im 42/51]
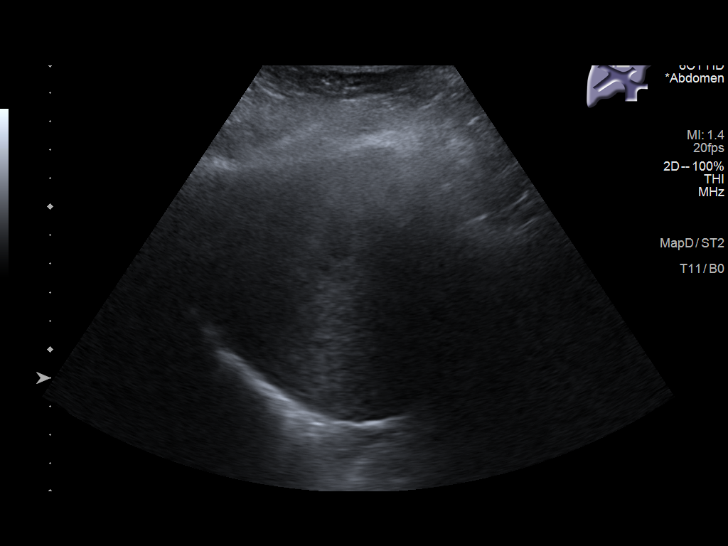
[im 46/51]
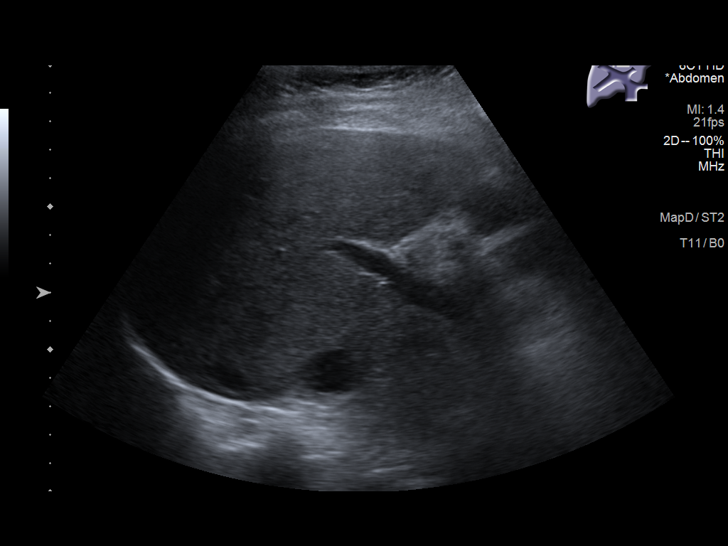
[im 51/51]
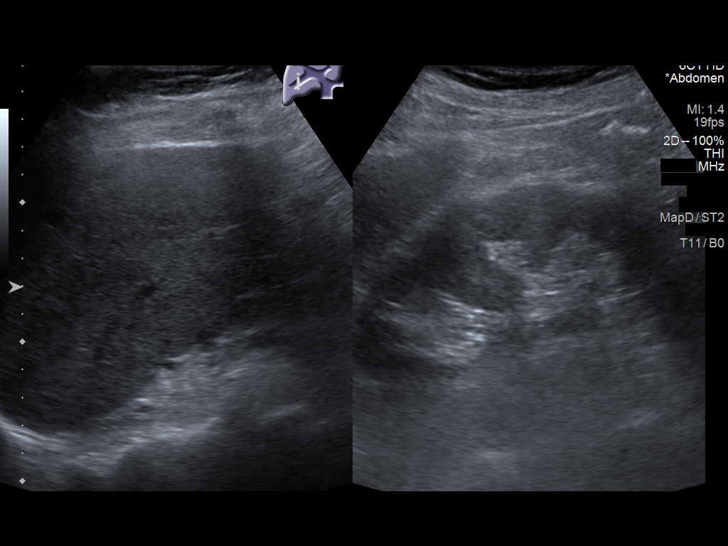

[14 of 25 positions shown; findings below may reference images not displayed]

FINDINGS: Gallbladder:

No gallstones or wall thickening visualized. No sonographic Murphy
sign noted by sonographer.

Common bile duct:

Diameter: 5 mm

Liver:

No focal lesion identified. Within normal limits in parenchymal
echogenicity. Portal vein is patent on color Doppler imaging with
normal direction of blood flow towards the liver.

Other: None.
IMPRESSION: 1. Unremarkable right upper quadrant ultrasound.

## 2023-08-03 IMAGING — CR DG HAND COMPLETE 3+V*L*
3 series · 3 of 3 positions shown · non-contrast
Comparison: None.

CLINICAL DATA: Left hand pain and swelling.

EXAM:
LEFT HAND - COMPLETE 3+ VIEW

[hand ap]
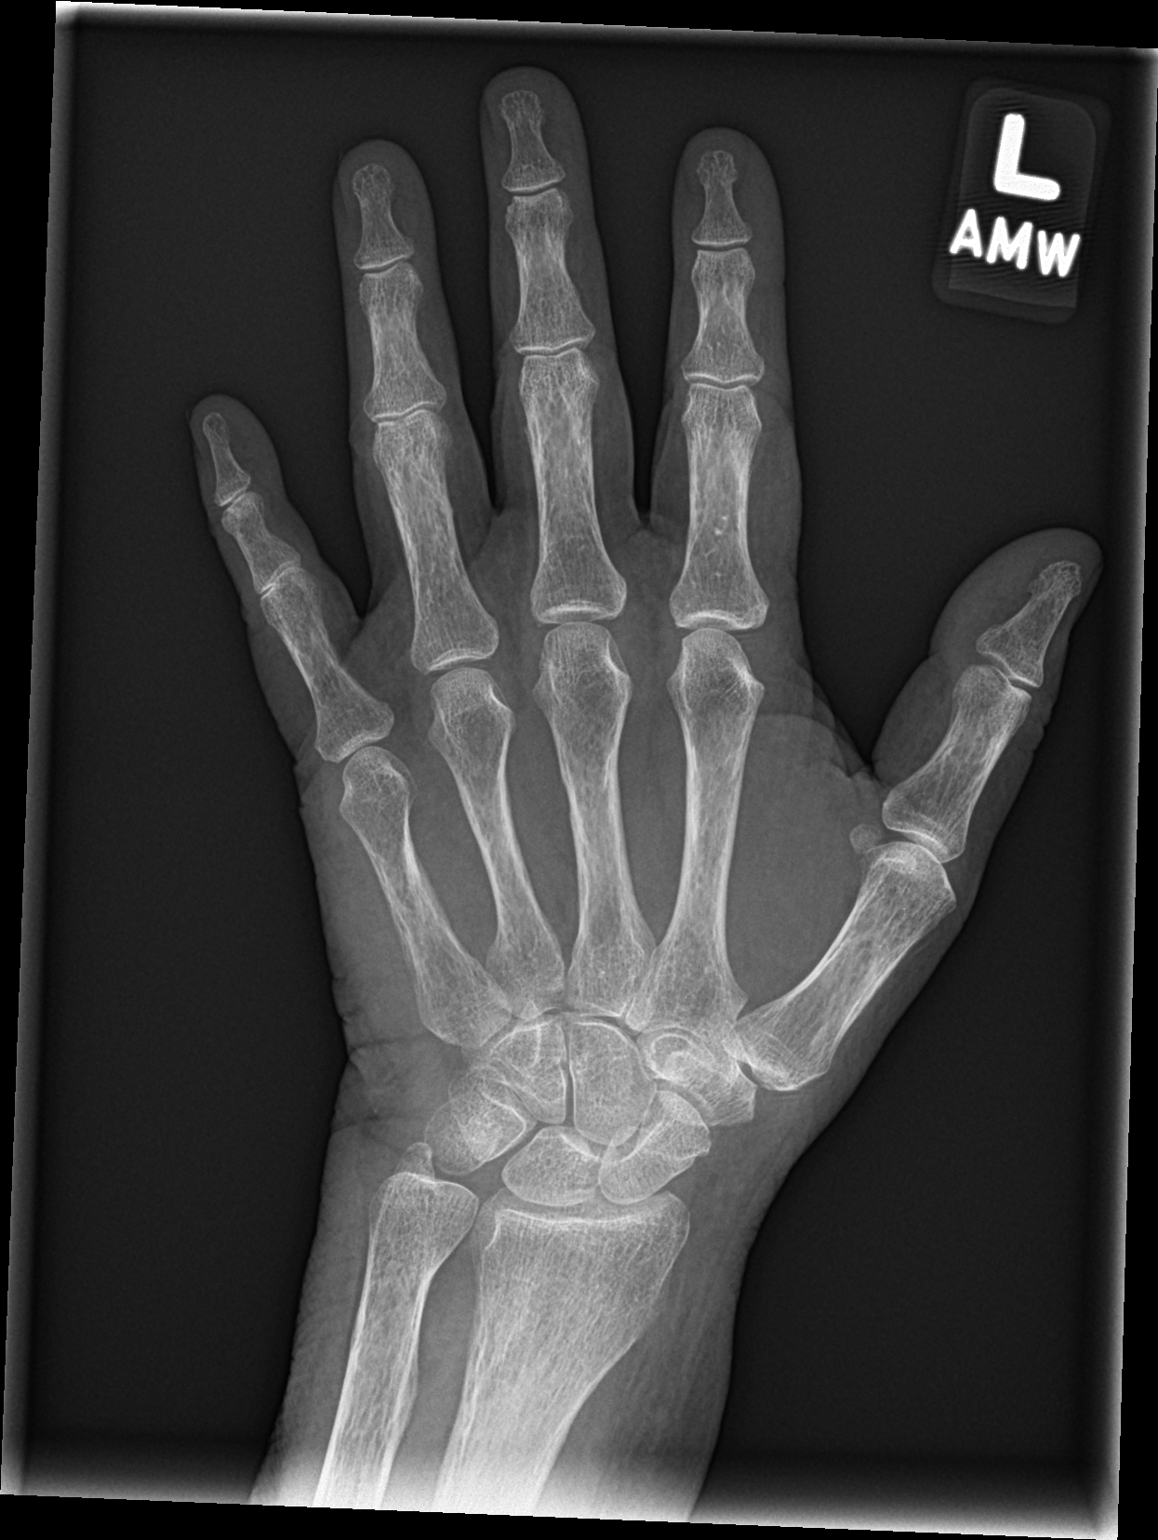

[hand obl]
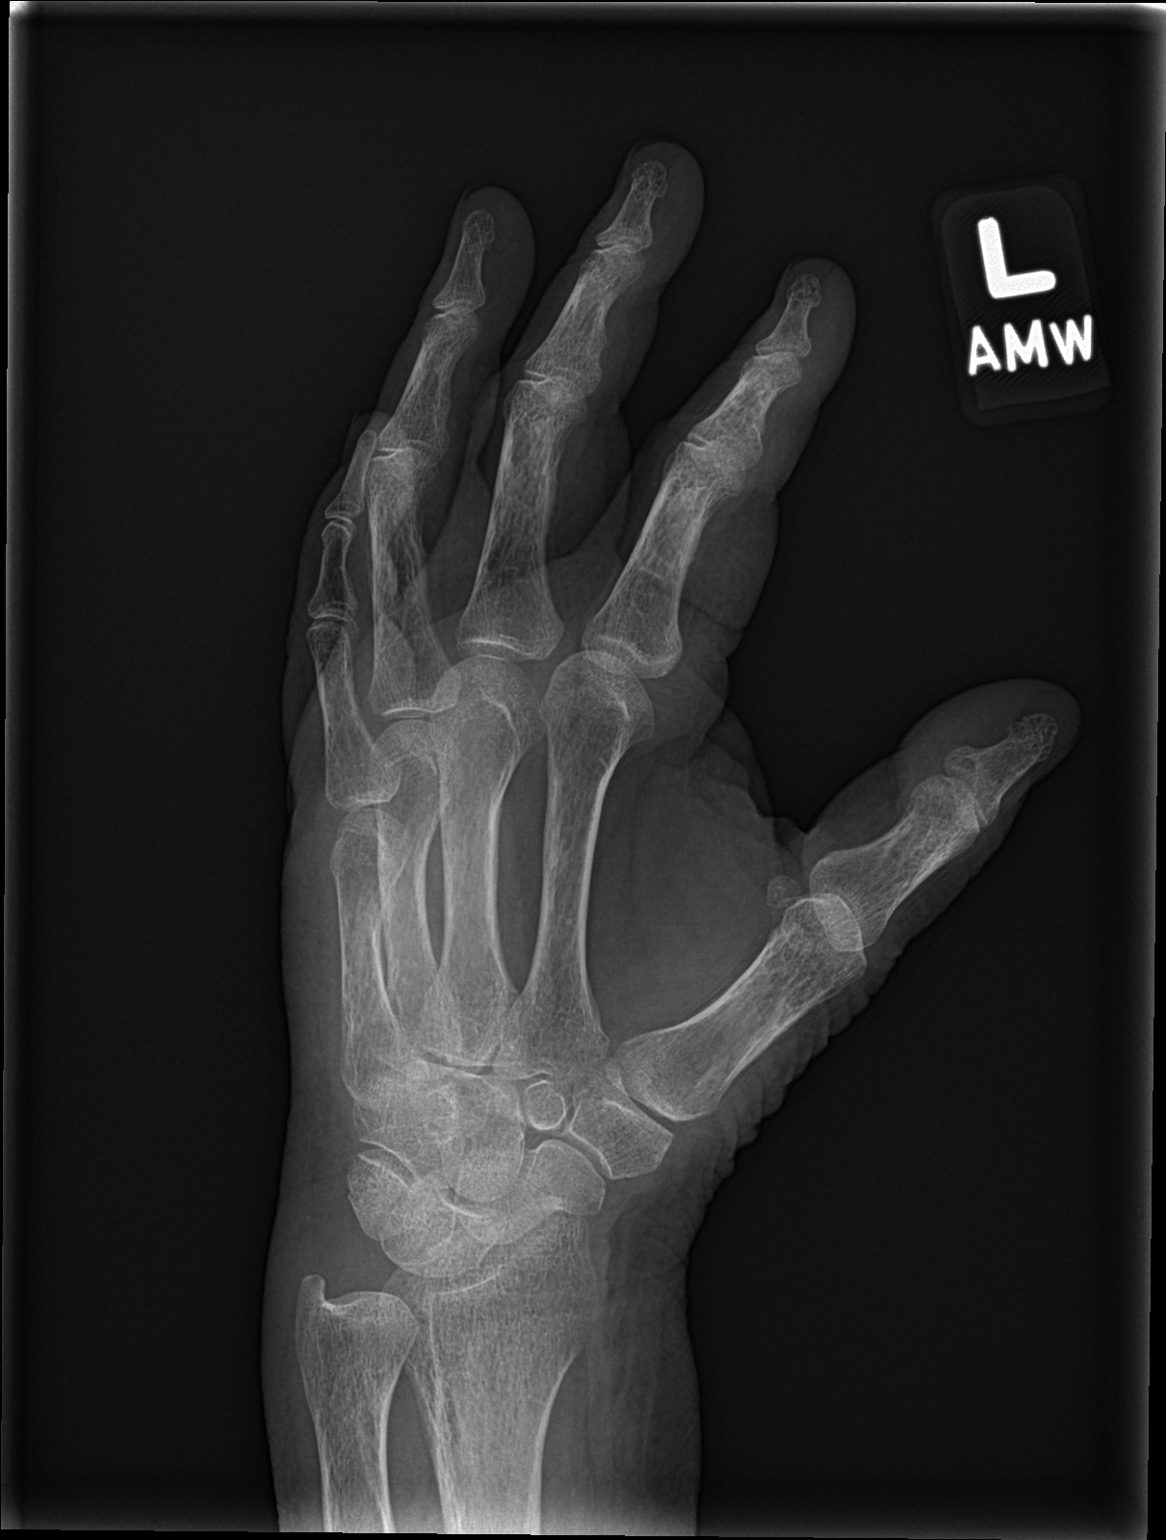

[hand lat]
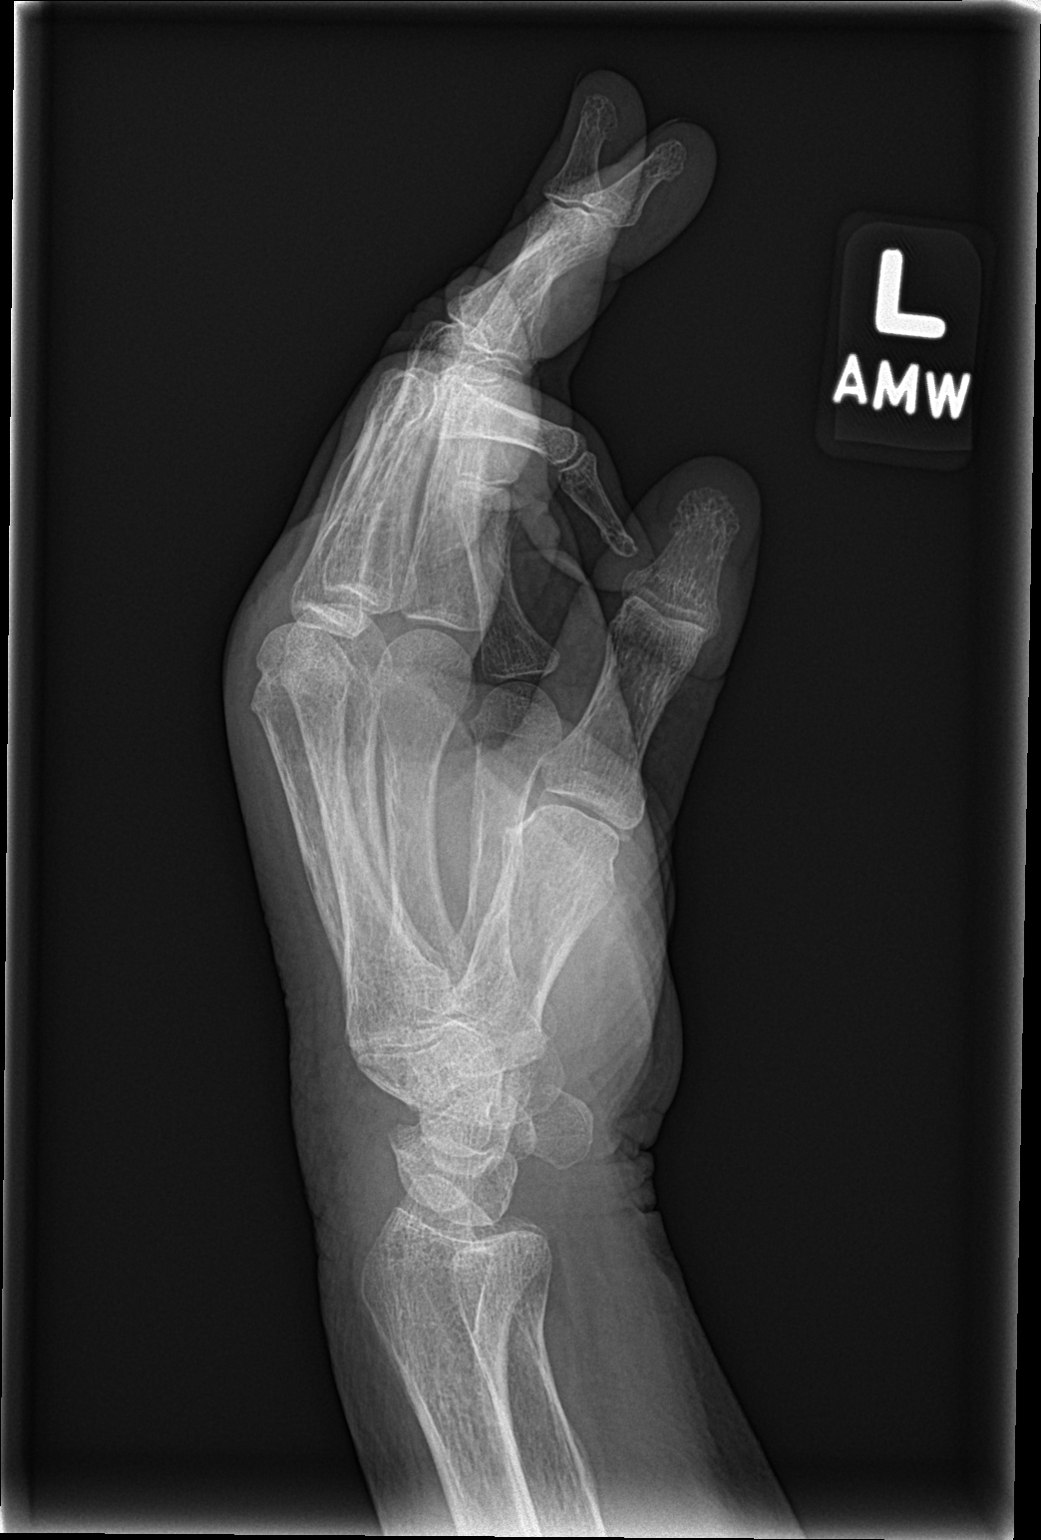

[3 of 3 positions shown; findings below may reference images not displayed]

FINDINGS: There is no acute fracture or dislocation. The bones are osteopenic.
No significant arthritic changes. Mild soft tissue swelling of the
dorsum of the hand. No radiopaque foreign object or soft tissue gas.
IMPRESSION: No acute osseous pathology.
